# Patient Record
Sex: Female | Born: 1980 | Race: White | Hispanic: No | Marital: Married | Smoking: Current every day smoker
Health system: Southern US, Community
[De-identification: ages and names within clinical notes are randomized; demographics above are authoritative.]

## PROBLEM LIST (undated history)

## (undated) DIAGNOSIS — R569 Unspecified convulsions: Secondary | ICD-10-CM

## (undated) DIAGNOSIS — F4310 Post-traumatic stress disorder, unspecified: Secondary | ICD-10-CM

## (undated) DIAGNOSIS — K769 Liver disease, unspecified: Secondary | ICD-10-CM

## (undated) DIAGNOSIS — I10 Essential (primary) hypertension: Secondary | ICD-10-CM

## (undated) DIAGNOSIS — F1020 Alcohol dependence, uncomplicated: Secondary | ICD-10-CM

## (undated) DIAGNOSIS — F319 Bipolar disorder, unspecified: Secondary | ICD-10-CM

## (undated) HISTORY — PX: CHOLECYSTECTOMY: SHX55

---

## 2003-11-29 ENCOUNTER — Emergency Department: Payer: Self-pay | Admitting: Emergency Medicine

## 2003-11-29 ENCOUNTER — Other Ambulatory Visit: Payer: Self-pay

## 2003-11-29 ENCOUNTER — Ambulatory Visit: Payer: Self-pay | Admitting: Emergency Medicine

## 2004-02-06 ENCOUNTER — Ambulatory Visit: Payer: Self-pay | Admitting: General Surgery

## 2006-04-03 ENCOUNTER — Emergency Department: Payer: Self-pay | Admitting: Emergency Medicine

## 2006-04-04 IMAGING — CR DG CHEST 2V
1 series · 2 of 2 positions shown · non-contrast
Comparison: none

REASON FOR EXAM: Chest pain
COMMENTS:

PROCEDURE:     DXR - DXR CHEST PA (OR AP) AND LATERAL  - [DATE]  [DATE]
RESULT:     The lungs are mildly hyperinflated but clear. The heart and
pulmonary vascularity are normal in appearance.

[Series 1: view not recorded · 0.17mm/px · 2 of 2 slices shown]
[im 1/2]
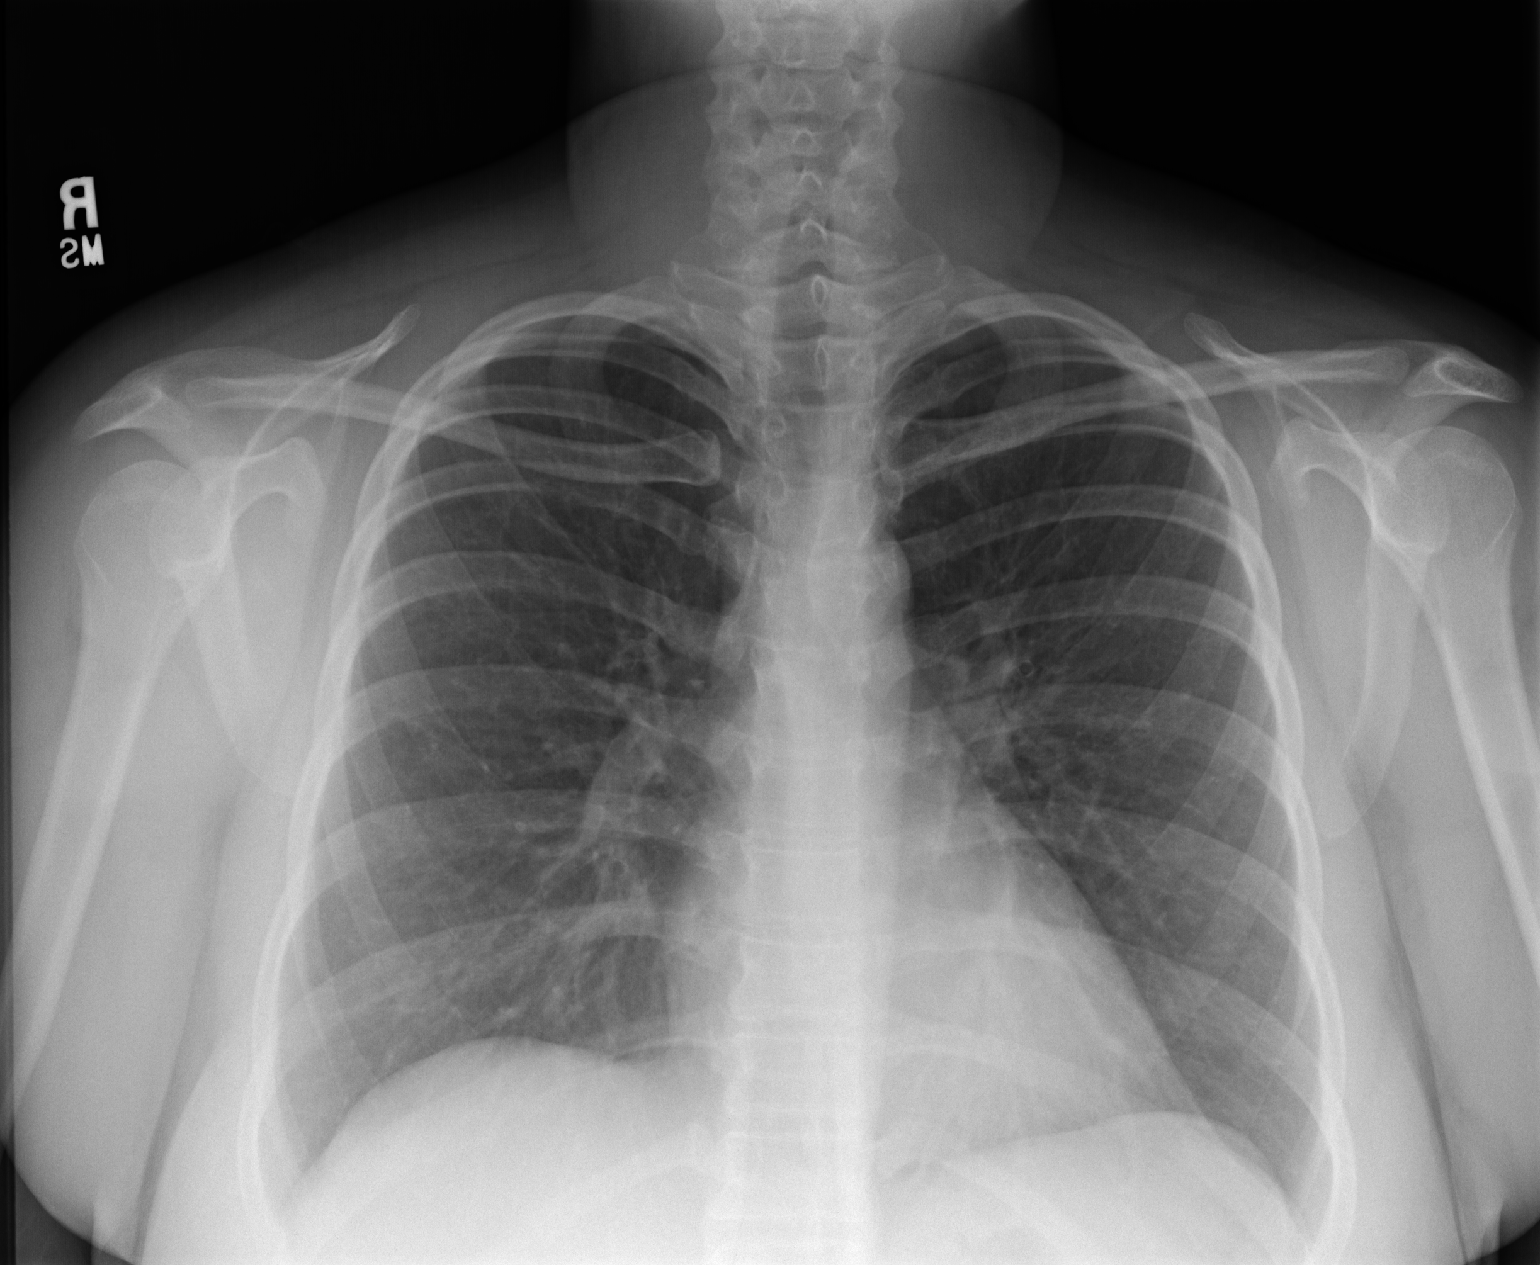
[im 2/2]
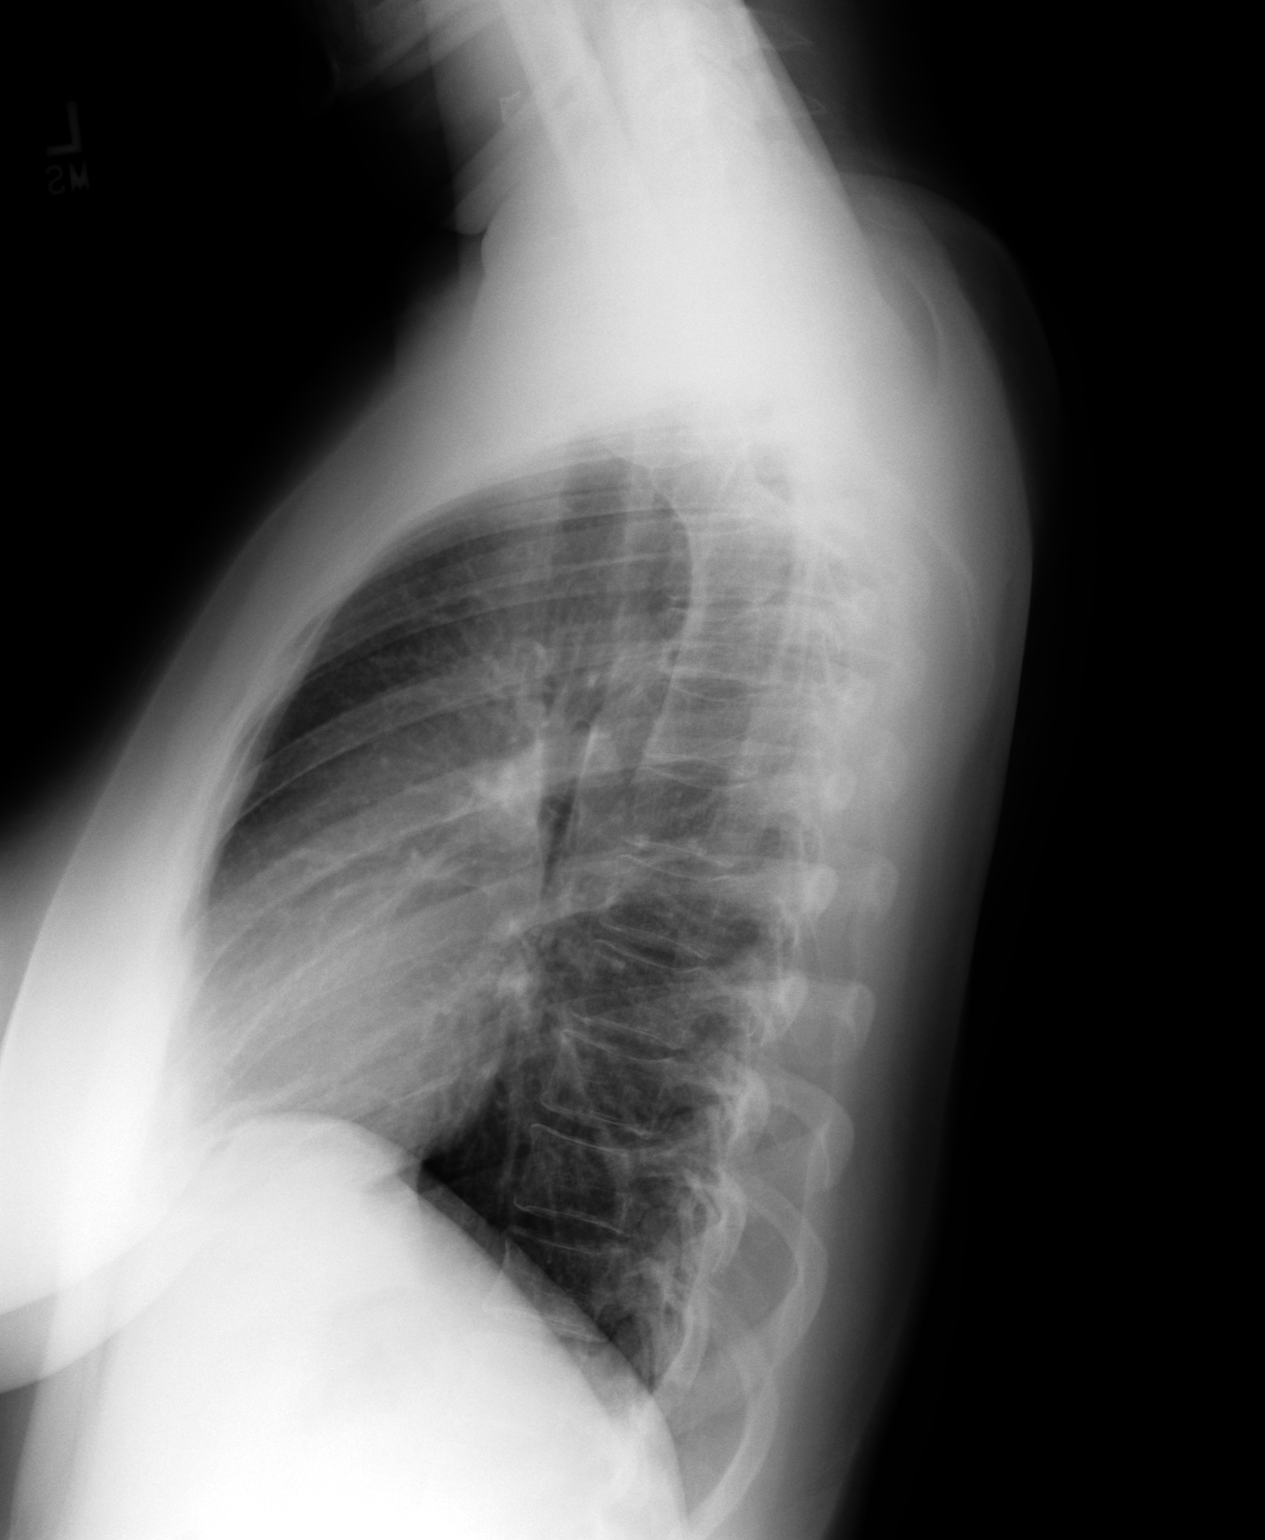

[2 of 2 positions shown; findings below may reference images not displayed]

IMPRESSION: I do not see evidence of acute cardiopulmonary abnormality.
Very mild hyperinflation, seen best on the lateral film, may be voluntary or
could reflect an element of underlying air trapping secondary to acute
bronchitis. Follow-up films would be of value, if symptoms persist.

## 2006-08-25 ENCOUNTER — Other Ambulatory Visit: Payer: Self-pay

## 2006-08-25 ENCOUNTER — Emergency Department: Payer: Self-pay | Admitting: Emergency Medicine

## 2007-02-18 ENCOUNTER — Encounter: Payer: Self-pay | Admitting: Maternal & Fetal Medicine

## 2007-02-18 IMAGING — US US OB DETAIL+14 WK - NRPT [facility]
1 series · 14 of 28 positions shown · non-contrast
Comparison: none

[Series 1: us ob detail+14 wk - nrpt [facility] · 0.37mm/px · 14 of 61 slices shown]
[im 3/61]
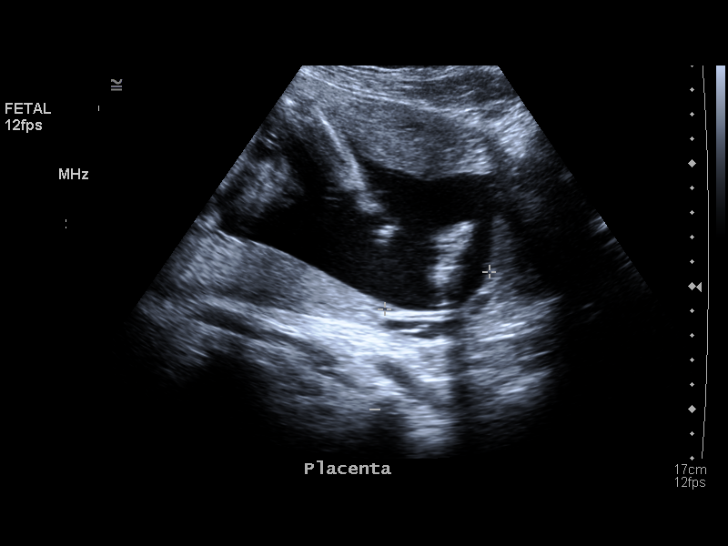
[im 7/61]
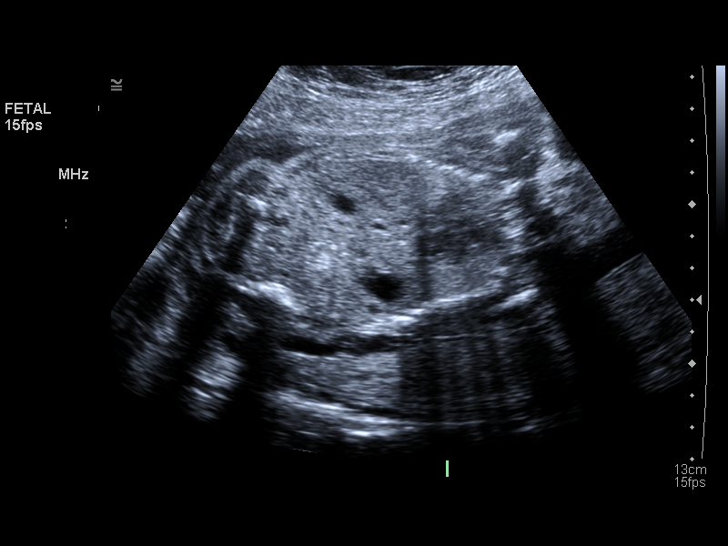
[im 12/61]
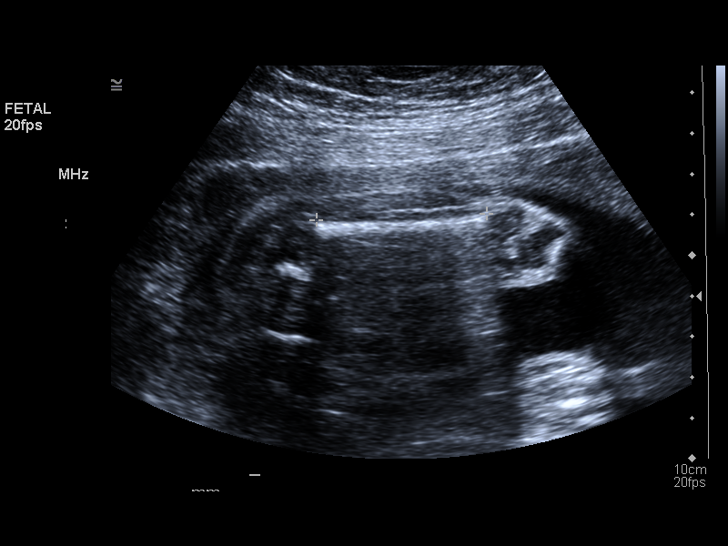
[im 16/61]
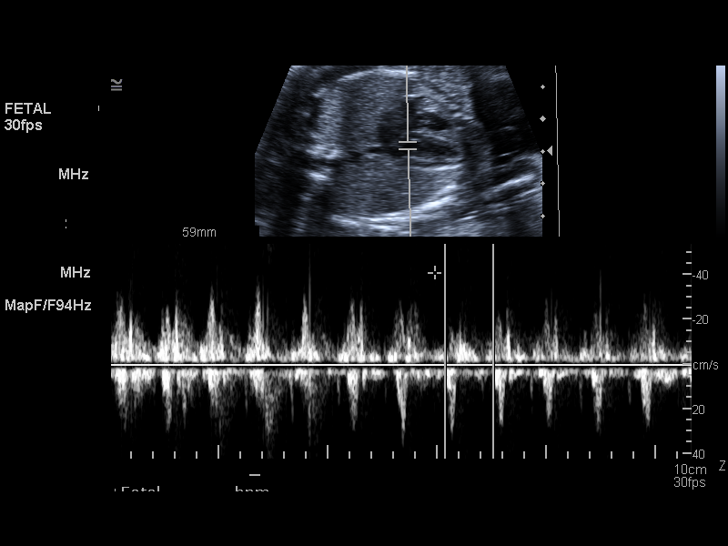
[im 21/61]
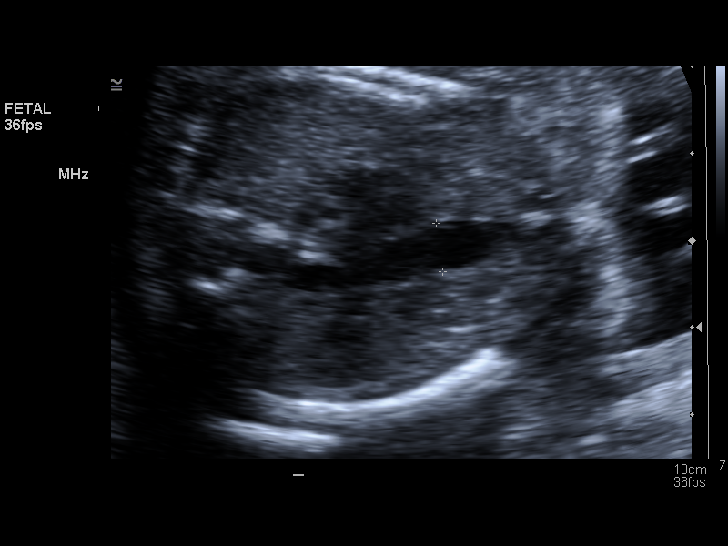
[im 25/61]
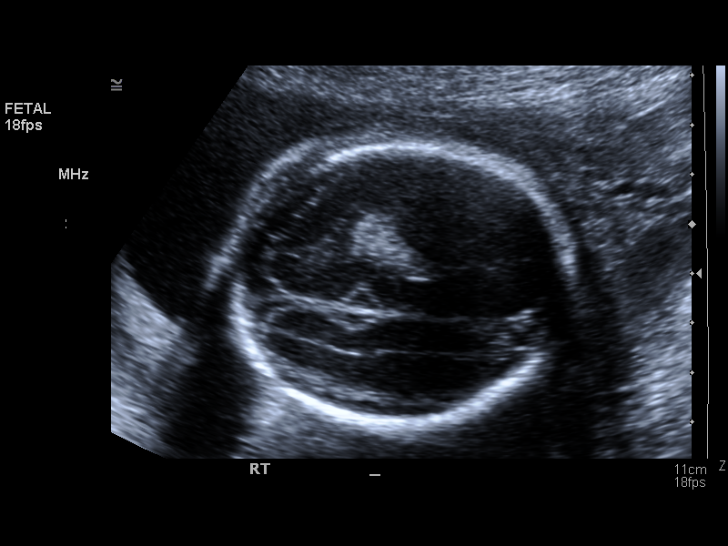
[im 29/61]
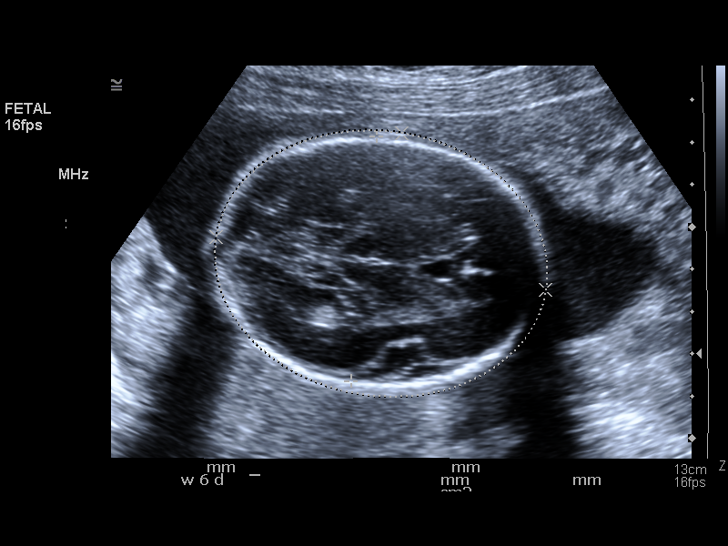
[im 34/61]
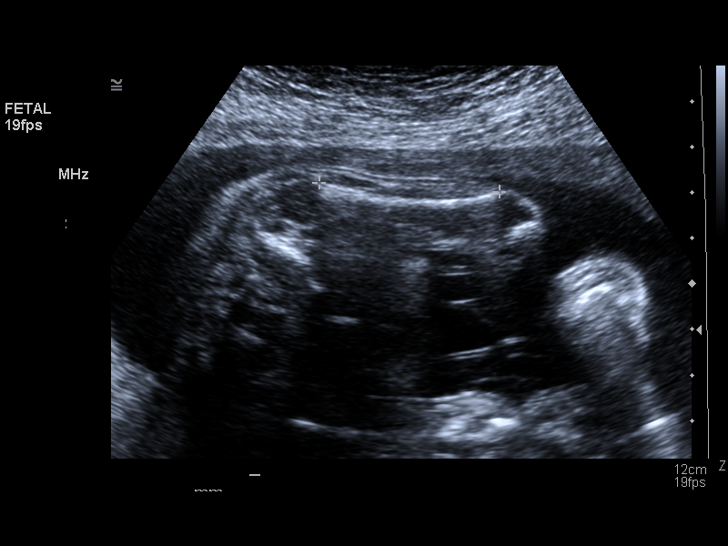
[im 38/61]
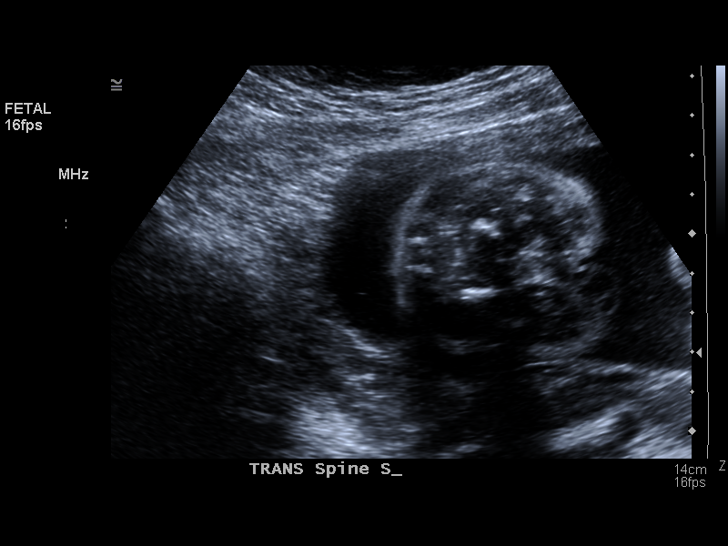
[im 43/61]
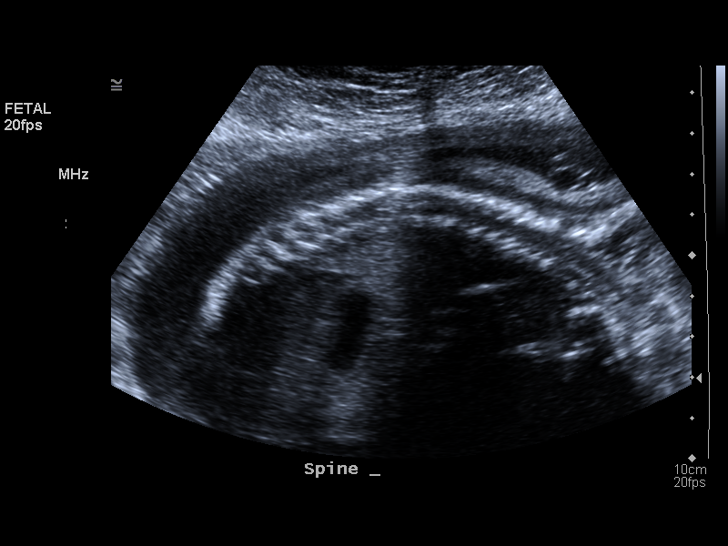
[im 47/61]
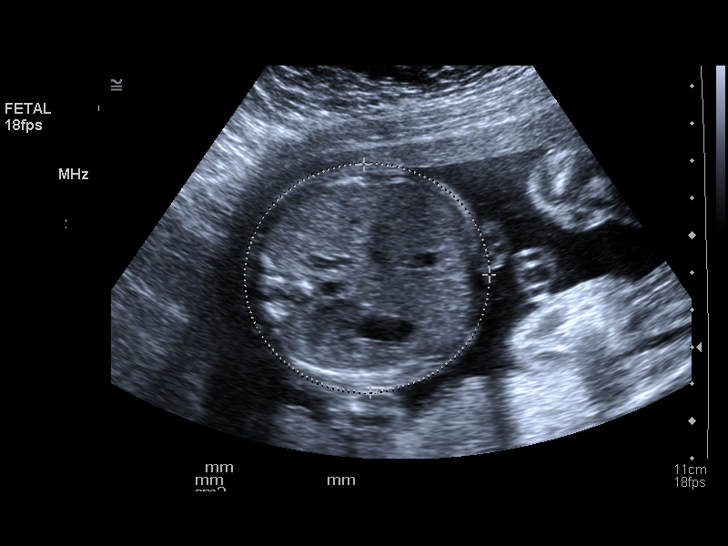
[im 52/61]
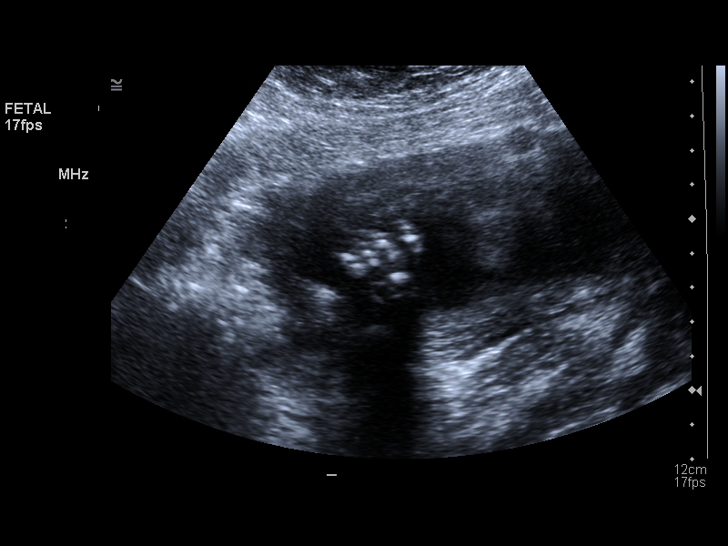
[im 56/61]
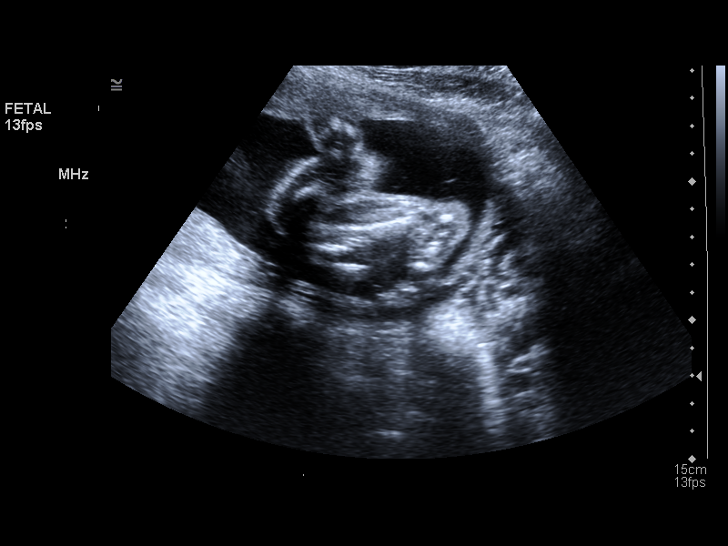
[im 61/61]
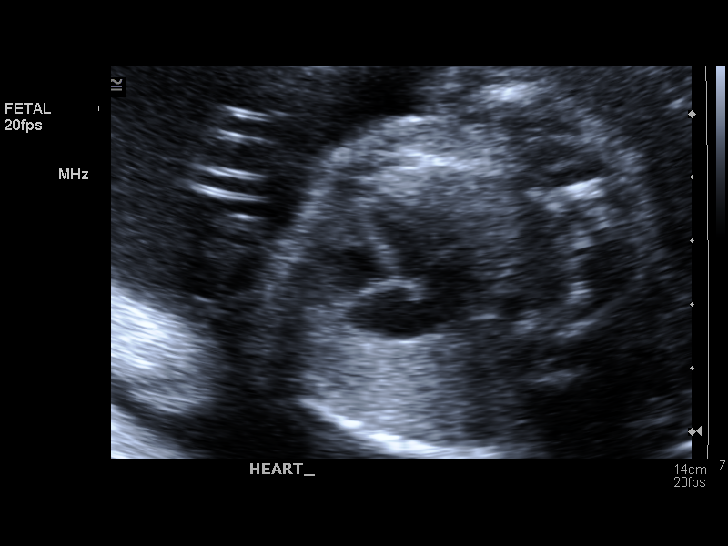

[14 of 28 positions shown; findings below may reference images not displayed]

IMAGES IMPORTED FROM THE [employer] WORKFLOW SYSTEM
NO DICTATION FOR STUDY

## 2007-04-28 ENCOUNTER — Observation Stay: Payer: Self-pay | Admitting: Obstetrics and Gynecology

## 2007-05-27 ENCOUNTER — Observation Stay: Payer: Self-pay

## 2007-06-08 ENCOUNTER — Observation Stay: Payer: Self-pay

## 2007-06-09 ENCOUNTER — Inpatient Hospital Stay: Payer: Self-pay

## 2007-07-10 ENCOUNTER — Emergency Department: Payer: Self-pay | Admitting: Emergency Medicine

## 2014-11-01 IMAGING — US US ABDOMEN LIMITED
1 series · 14 of 25 positions shown · non-contrast
Comparison: None.

CLINICAL DATA: Abdominal pain

EXAM:
US ABDOMEN LIMITED - RIGHT UPPER QUADRANT

[Series 1: us abdomen limited · 0.25mm/px · 14 of 46 slices shown]
[im 1/46]
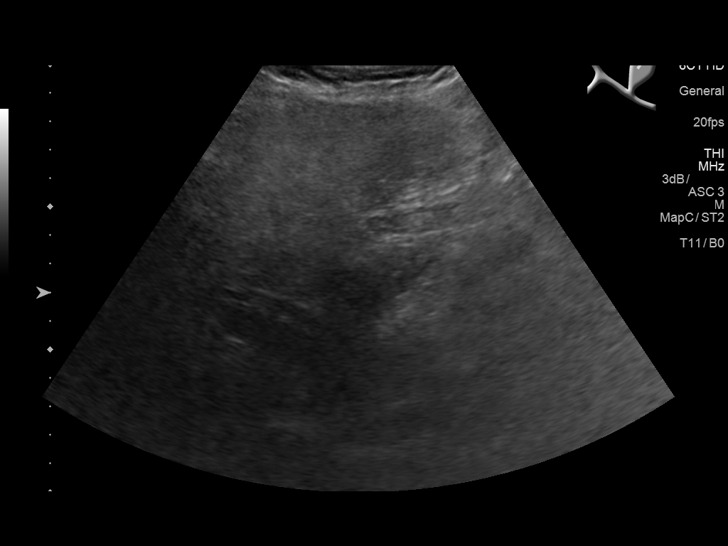
[im 4/46]
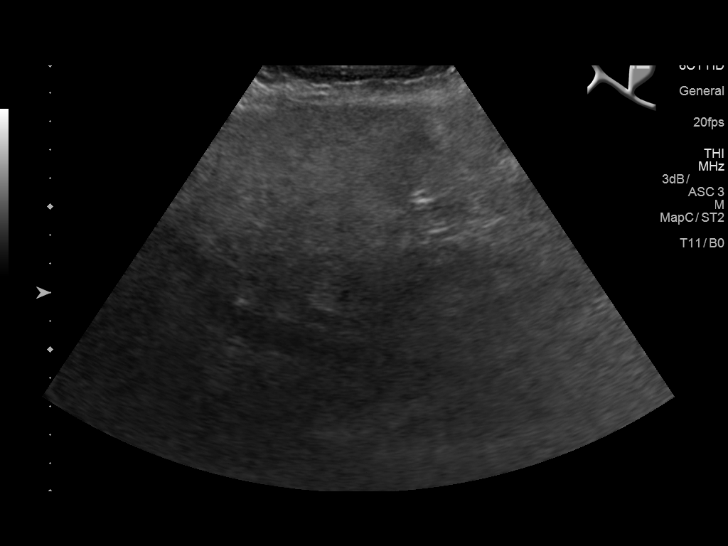
[im 8/46]
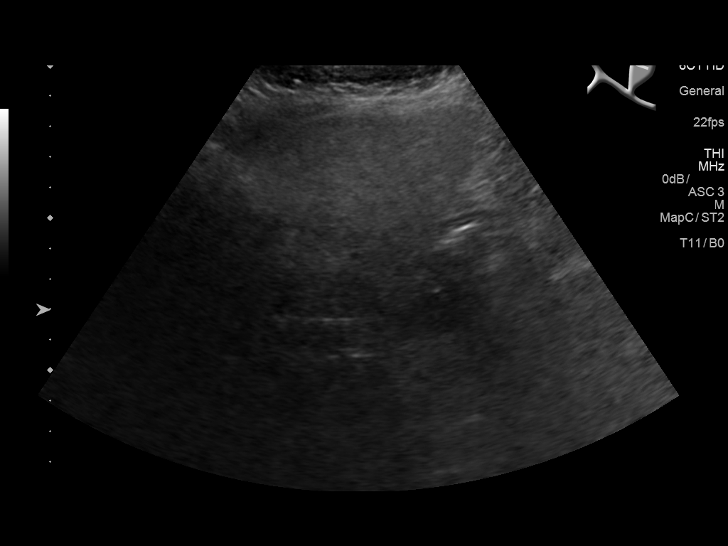
[im 12/46]
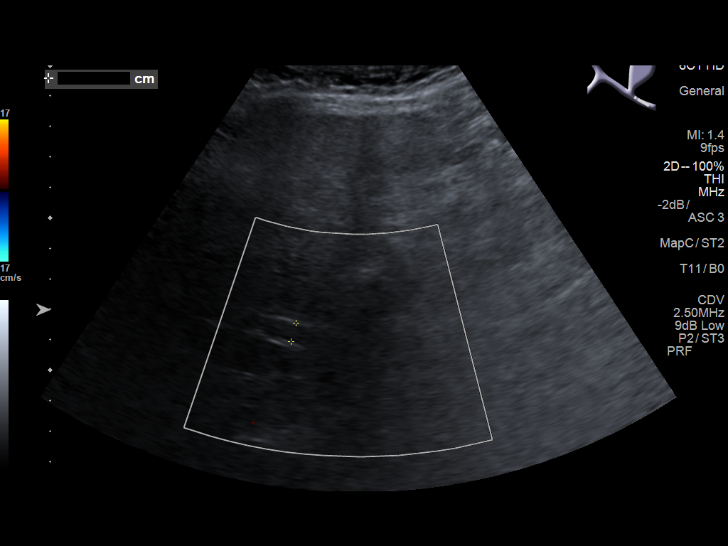
[im 16/46]
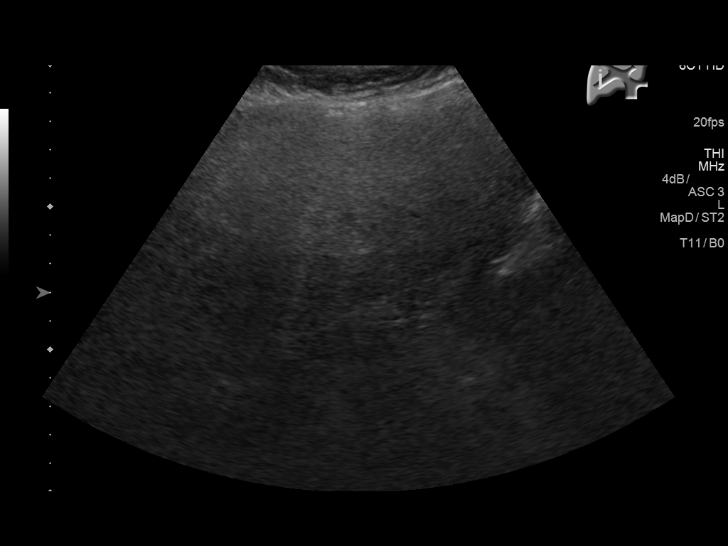
[im 17/46]
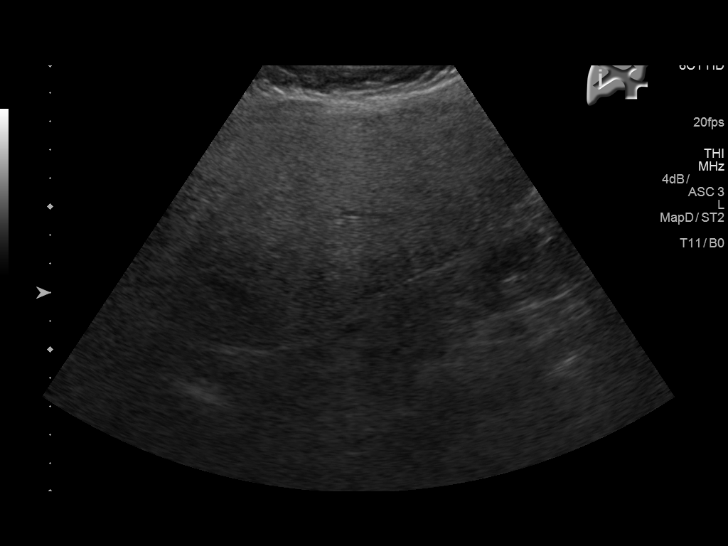
[im 21/46]
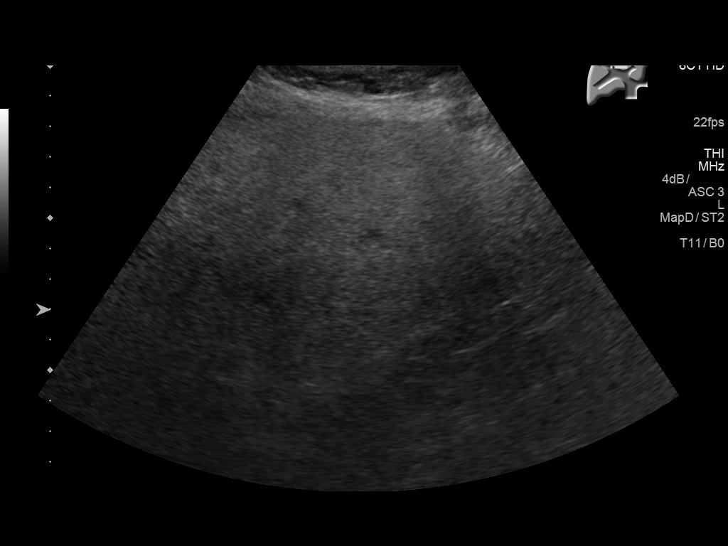
[im 25/46]
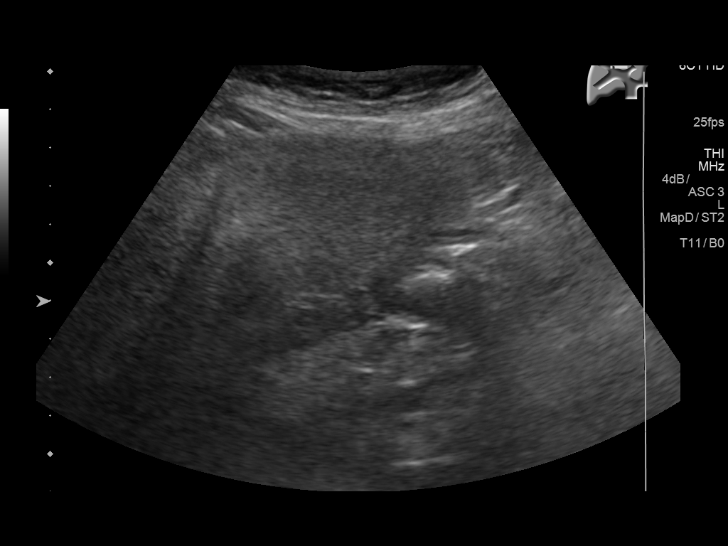
[im 29/46]
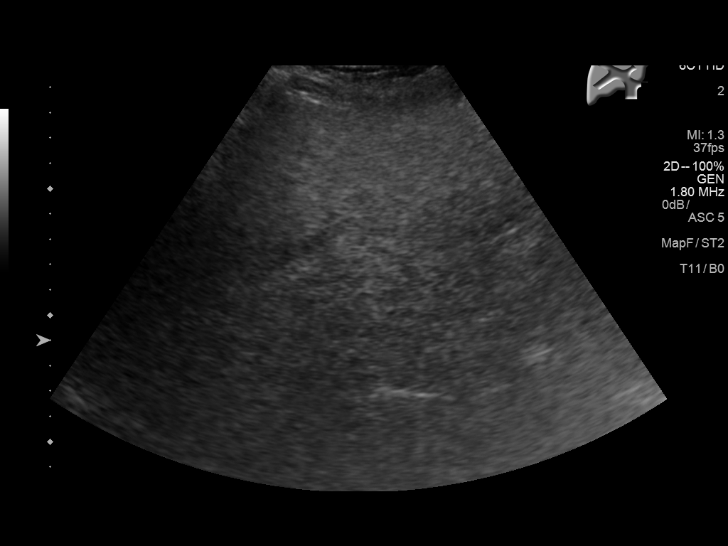
[im 31/46]
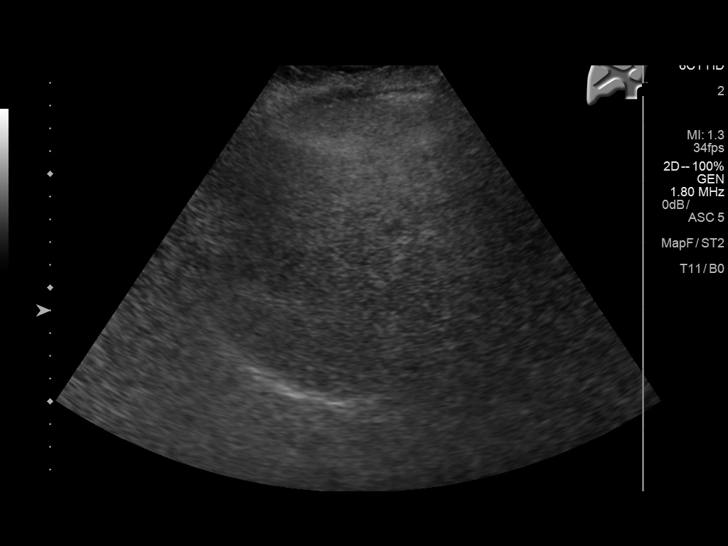
[im 34/46]
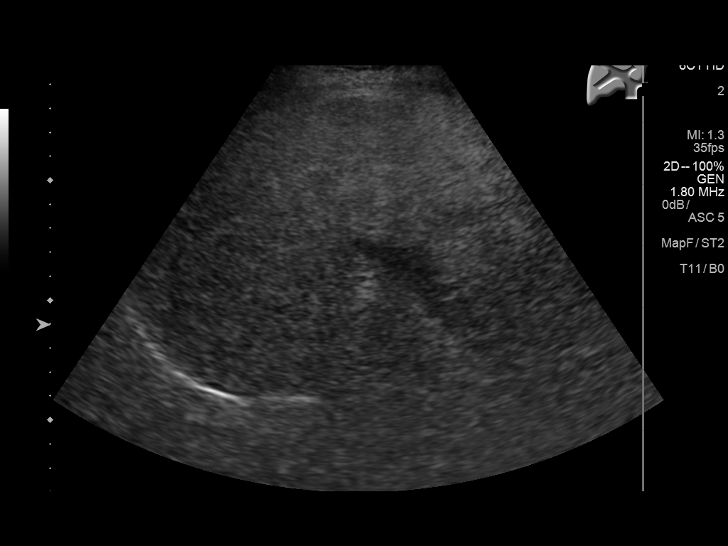
[im 38/46]
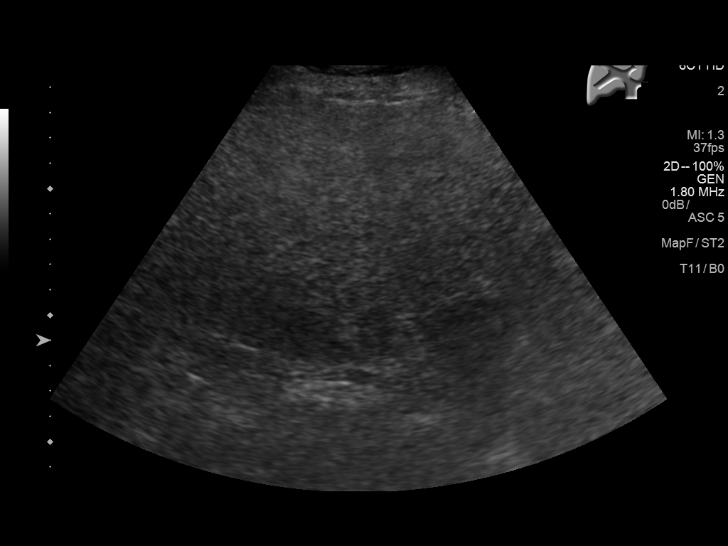
[im 42/46]
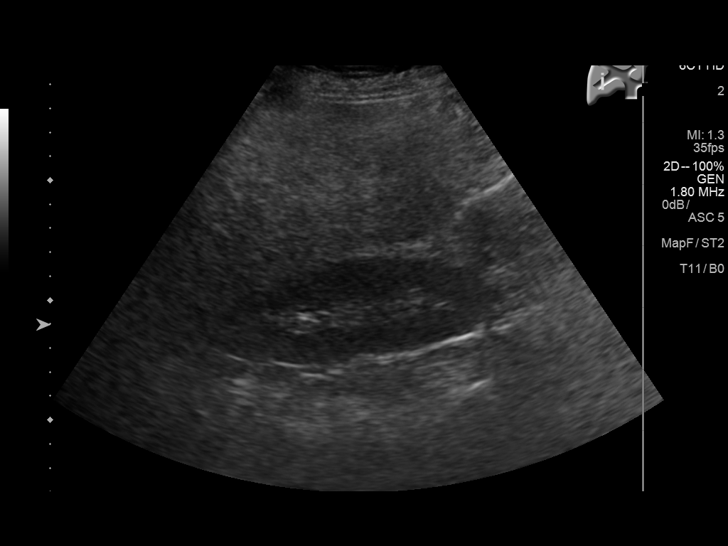
[im 46/46]
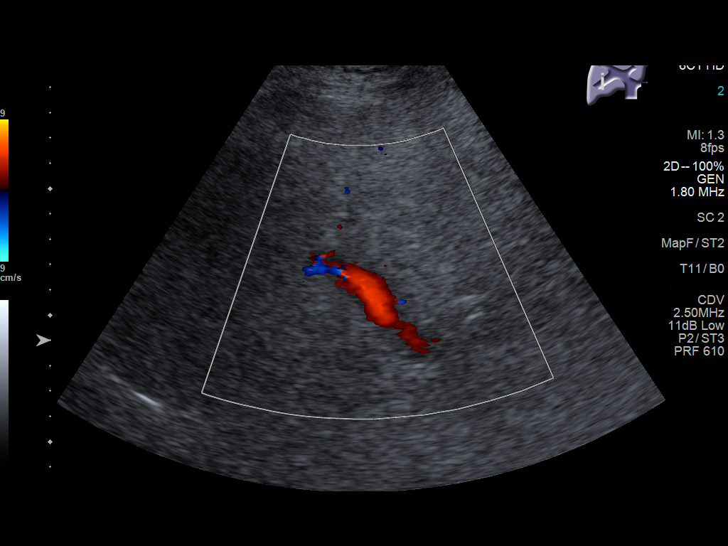

[14 of 25 positions shown; findings below may reference images not displayed]

FINDINGS: Gallbladder:

Prior cholecystectomy

Common bile duct:

Diameter: Normal diameter, 6 mm.

Liver:

Increased echotexture compatible with fatty infiltration. No focal
abnormality or biliary ductal dilatation.
IMPRESSION: Fatty infiltration of the liver.  No acute findings.

Prior cholecystectomy.

## 2016-02-27 ENCOUNTER — Emergency Department
Admission: EM | Admit: 2016-02-27 | Discharge: 2016-02-28 | Disposition: A | Discharge status: Other Acute Inpatient Hospital | Payer: Self-pay | Attending: Emergency Medicine | Admitting: Emergency Medicine

## 2016-02-27 ENCOUNTER — Emergency Department: Payer: Self-pay

## 2016-02-27 ENCOUNTER — Encounter: Payer: Self-pay | Admitting: Emergency Medicine

## 2016-02-27 DIAGNOSIS — E876 Hypokalemia: Secondary | ICD-10-CM | POA: Insufficient documentation

## 2016-02-27 DIAGNOSIS — K7040 Alcoholic hepatic failure without coma: Secondary | ICD-10-CM | POA: Insufficient documentation

## 2016-02-27 DIAGNOSIS — F1020 Alcohol dependence, uncomplicated: Secondary | ICD-10-CM

## 2016-02-27 DIAGNOSIS — R109 Unspecified abdominal pain: Secondary | ICD-10-CM

## 2016-02-27 DIAGNOSIS — F17200 Nicotine dependence, unspecified, uncomplicated: Secondary | ICD-10-CM | POA: Insufficient documentation

## 2016-02-27 DIAGNOSIS — R9431 Abnormal electrocardiogram [ECG] [EKG]: Secondary | ICD-10-CM

## 2016-02-27 DIAGNOSIS — I10 Essential (primary) hypertension: Secondary | ICD-10-CM | POA: Insufficient documentation

## 2016-02-27 DIAGNOSIS — K7290 Hepatic failure, unspecified without coma: Secondary | ICD-10-CM

## 2016-02-27 DIAGNOSIS — G9389 Other specified disorders of brain: Secondary | ICD-10-CM | POA: Insufficient documentation

## 2016-02-27 DIAGNOSIS — K7200 Acute and subacute hepatic failure without coma: Secondary | ICD-10-CM

## 2016-02-27 HISTORY — DX: Bipolar disorder, unspecified: F31.9

## 2016-02-27 HISTORY — DX: Alcohol dependence, uncomplicated: F10.20

## 2016-02-27 HISTORY — DX: Post-traumatic stress disorder, unspecified: F43.10

## 2016-02-27 HISTORY — DX: Essential (primary) hypertension: I10

## 2016-02-27 LAB — BASIC METABOLIC PANEL
Anion gap: 28 — ABNORMAL HIGH (ref 5–15)
BUN: <5 mg/dL — ABNORMAL LOW (ref 6–20)
CHLORIDE: 65 mmol/L — AB (ref 101–111)
CO2: 38 mmol/L — AB (ref 22–32)
CREATININE: 0.34 mg/dL — AB (ref 0.44–1.00)
Calcium: 8.6 mg/dL — ABNORMAL LOW (ref 8.9–10.3)
GFR calc non Af Amer: >60 mL/min (ref >60)
Glucose, Bld: 130 mg/dL — ABNORMAL HIGH (ref 65–99)
Potassium: <2 mmol/L — CL (ref 3.5–5.1)
Sodium: 131 mmol/L — ABNORMAL LOW (ref 135–145)

## 2016-02-27 LAB — CBC
HCT: 35.8 % (ref 35.0–47.0)
HEMOGLOBIN: 12.6 g/dL (ref 12.0–16.0)
MCH: 39.5 pg — AB (ref 26.0–34.0)
MCHC: 35.3 g/dL (ref 32.0–36.0)
MCV: 111.7 fL — AB (ref 80.0–100.0)
PLATELETS: 101 10*3/uL — AB (ref 150–440)
RBC: 3.2 MIL/uL — AB (ref 3.80–5.20)
RDW: 20.2 % — ABNORMAL HIGH (ref 11.5–14.5)
WBC: 5.8 10*3/uL (ref 3.6–11.0)

## 2016-02-27 LAB — ETHANOL: Alcohol, Ethyl (B): 96 mg/dL — ABNORMAL HIGH (ref <5)

## 2016-02-27 LAB — HEPATIC FUNCTION PANEL
ALK PHOS: 390 U/L — AB (ref 38–126)
ALT: 80 U/L — AB (ref 14–54)
AST: 450 U/L — AB (ref 15–41)
Albumin: 2.8 g/dL — ABNORMAL LOW (ref 3.5–5.0)
BILIRUBIN DIRECT: 8.2 mg/dL — AB (ref 0.1–0.5)
BILIRUBIN TOTAL: 14.8 mg/dL — AB (ref 0.3–1.2)
Indirect Bilirubin: 6.6 mg/dL — ABNORMAL HIGH (ref 0.3–0.9)
Total Protein: 6.8 g/dL (ref 6.5–8.1)

## 2016-02-27 LAB — MAGNESIUM: MAGNESIUM: 1.4 mg/dL — AB (ref 1.7–2.4)

## 2016-02-27 LAB — TROPONIN I: Troponin I: 0.04 ng/mL (ref <0.03)

## 2016-02-27 LAB — AMMONIA: AMMONIA: 36 umol/L — AB (ref 9–35)

## 2016-02-27 LAB — POCT PREGNANCY, URINE: Preg Test, Ur: NEGATIVE

## 2016-02-27 LAB — POTASSIUM: POTASSIUM: 3.1 mmol/L — AB (ref 3.5–5.1)

## 2016-02-27 LAB — APTT: aPTT: 40 seconds — ABNORMAL HIGH (ref 24–36)

## 2016-02-27 LAB — ACETAMINOPHEN LEVEL: Acetaminophen (Tylenol), Serum: <10 ug/mL — ABNORMAL LOW (ref 10–30)

## 2016-02-27 LAB — PROTIME-INR
INR: 1.38
Prothrombin Time: 17.1 seconds — ABNORMAL HIGH (ref 11.4–15.2)

## 2016-02-27 IMAGING — CR DG CHEST 2V
2 series · 2 of 2 positions shown · non-contrast
Comparison: [DATE]

CLINICAL DATA: Chest pain

EXAM:
CHEST  2 VIEW

[chest lat]
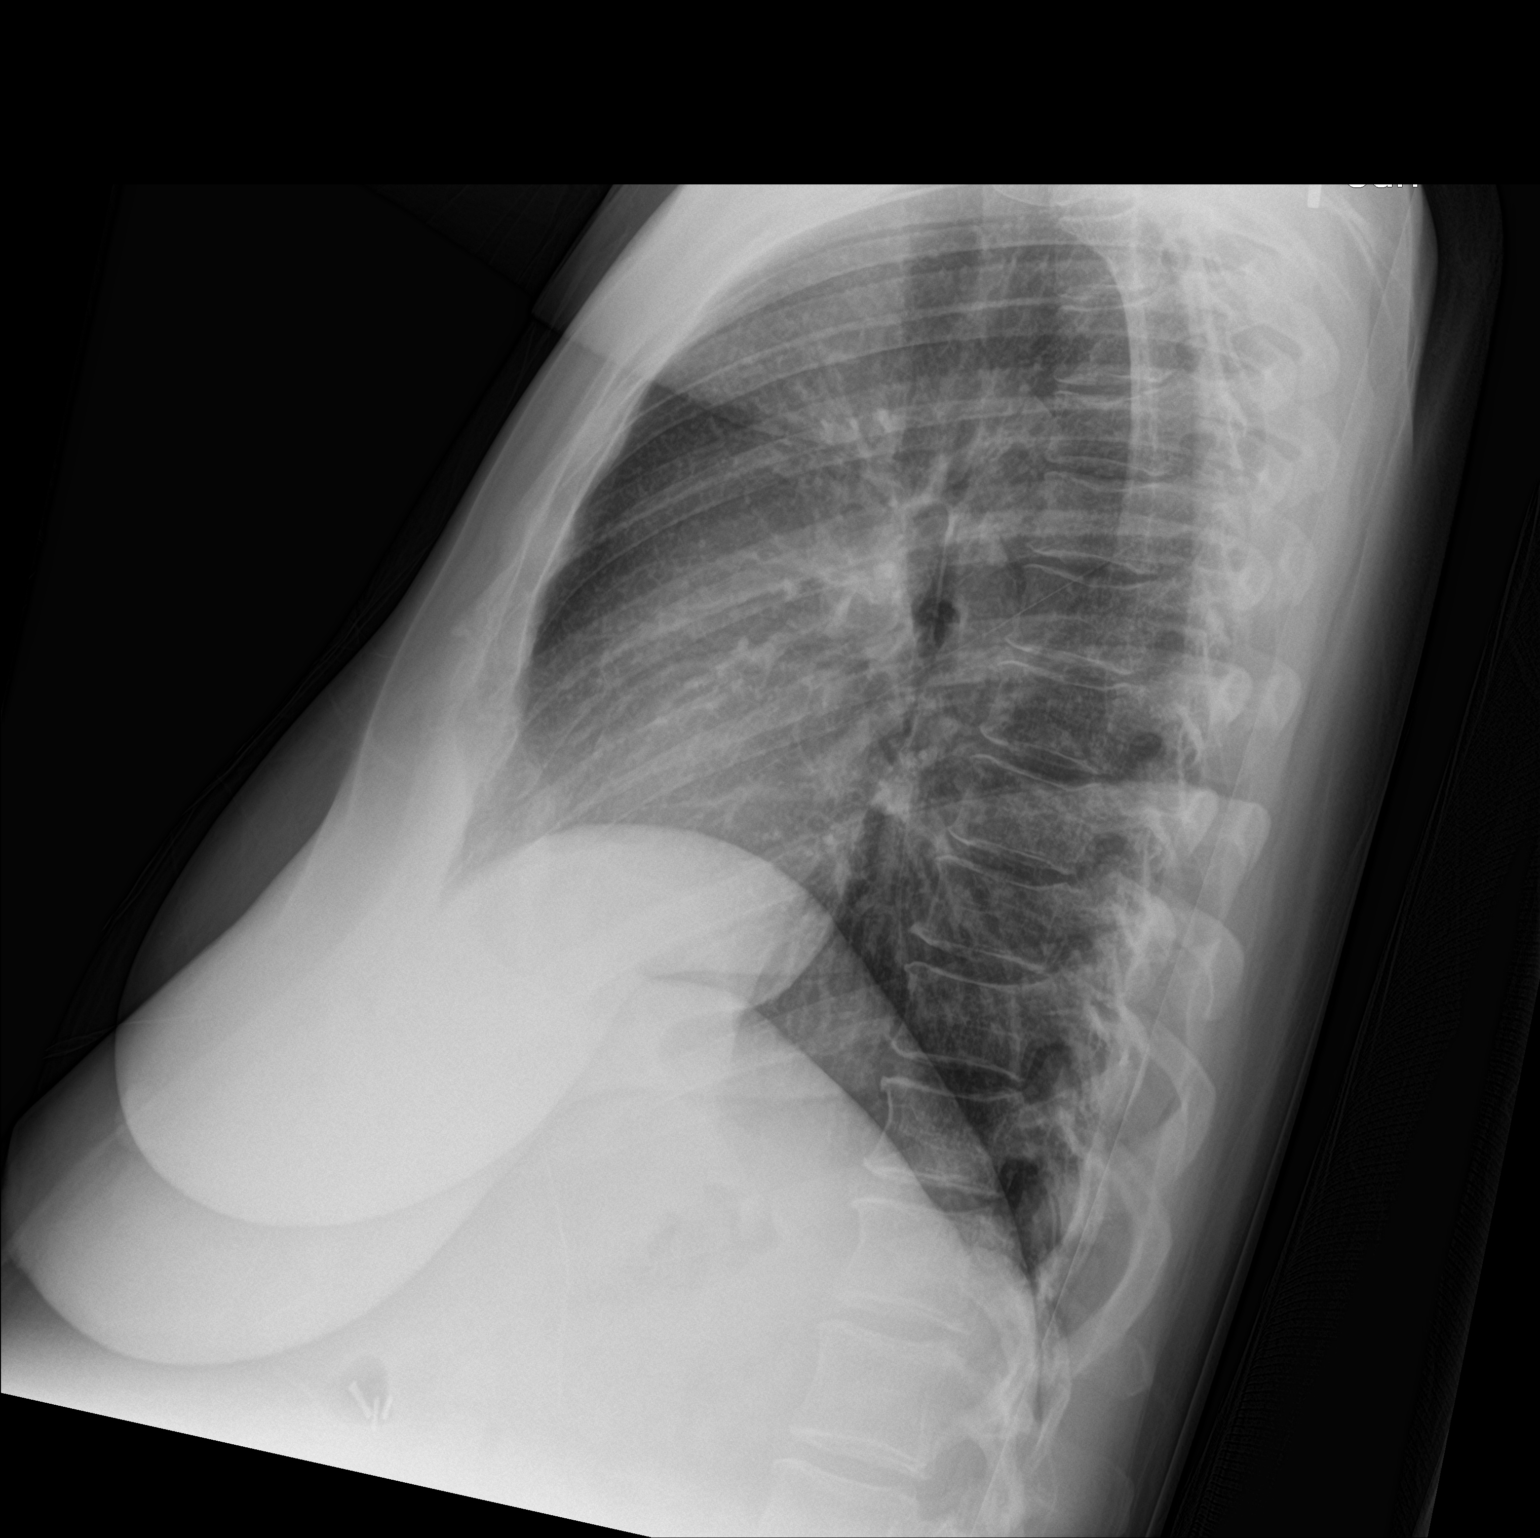

[chest ap]
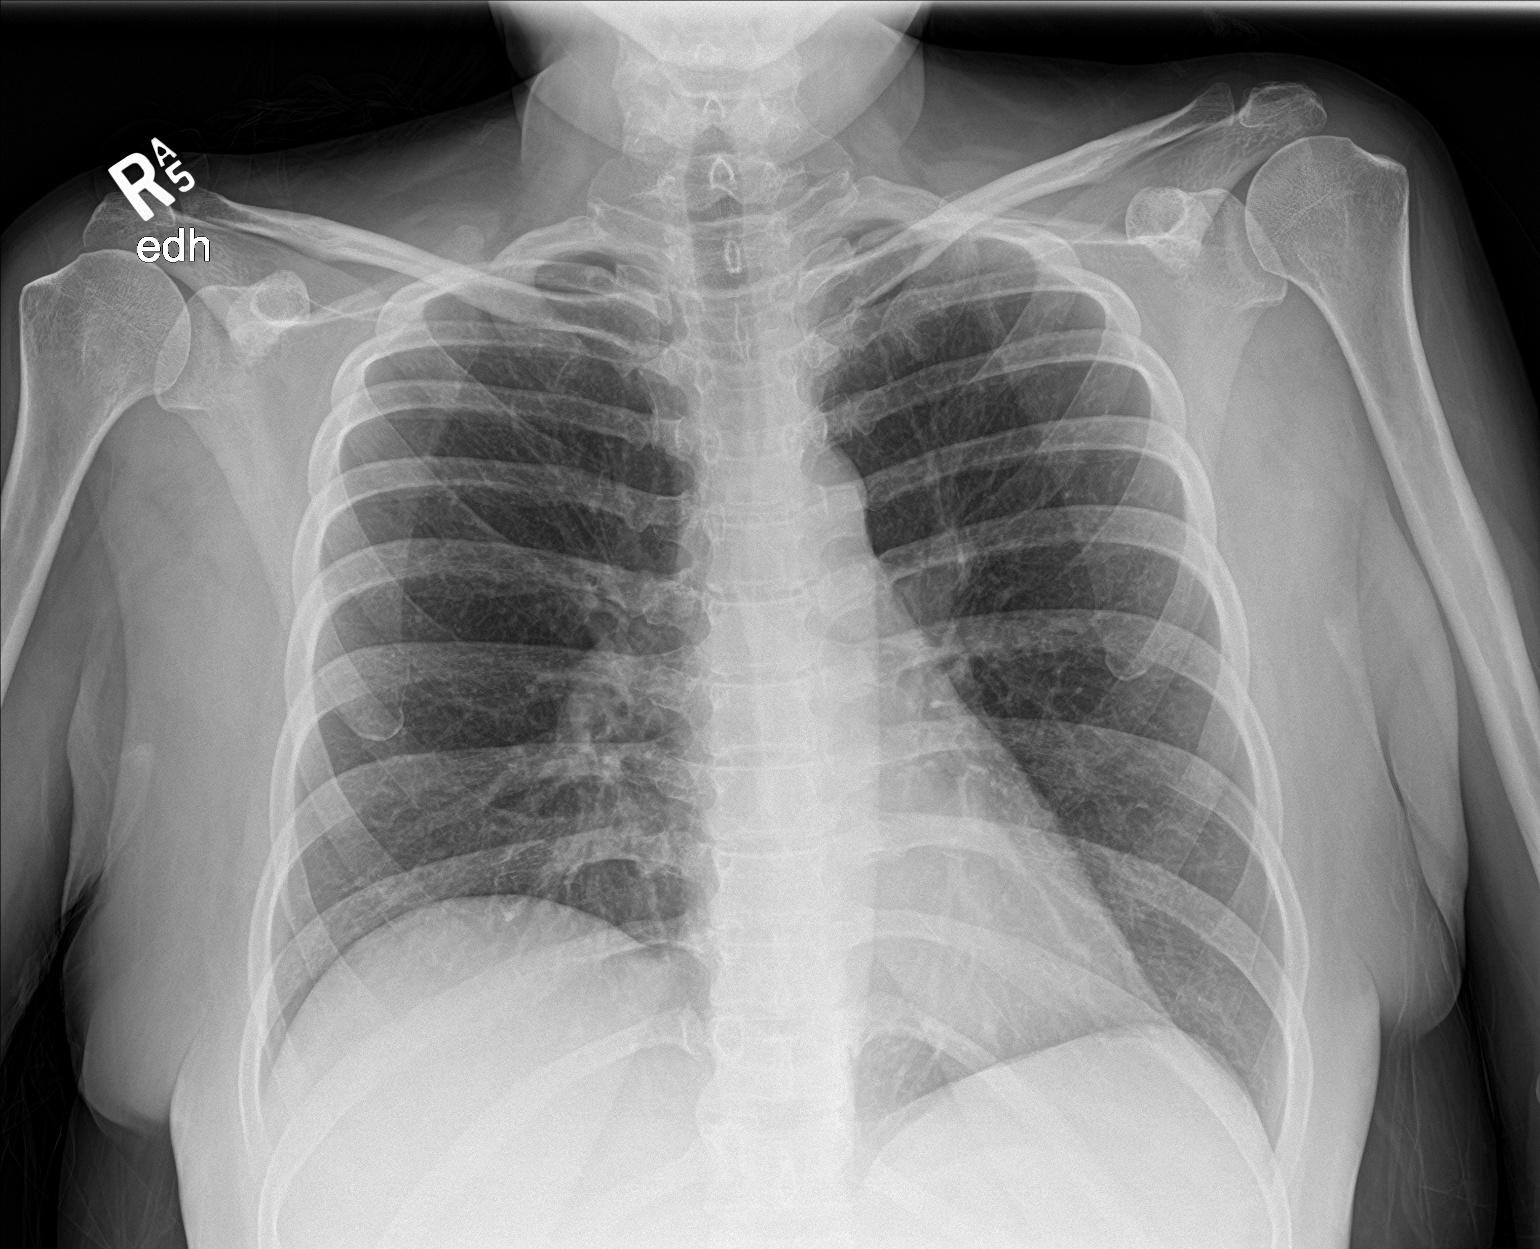

[2 of 2 positions shown; findings below may reference images not displayed]

FINDINGS: Heart and mediastinal contours are within normal limits. No focal
opacities or effusions. No acute bony abnormality.
IMPRESSION: No active cardiopulmonary disease.

## 2016-02-27 IMAGING — CT CT HEAD W/O CM
3 series · 15 of 46 positions shown, 18 images · non-contrast
Comparison: None.

CLINICAL DATA: Seizure activity and decreased responsiveness

EXAM:
CT HEAD WITHOUT CONTRAST
TECHNIQUE: Contiguous axial images were obtained from the base of the skull
through the vertex without intravenous contrast.

[Series 2: head wo · axial · 0.47mm/px · z∈[-153,-33]mm · 9 of 29 slices shown, 12 images]
[im 3/29  brain]
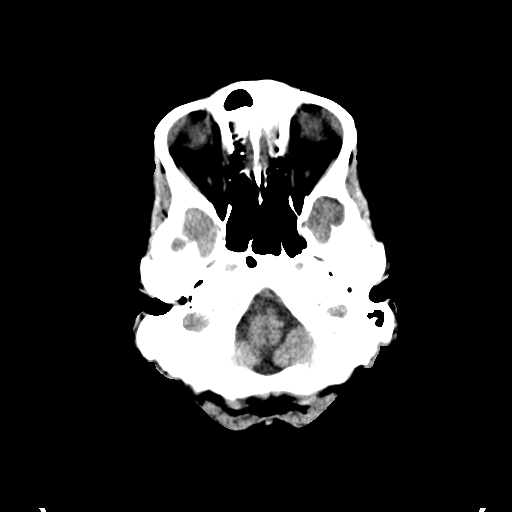
[im 3/29  bone]
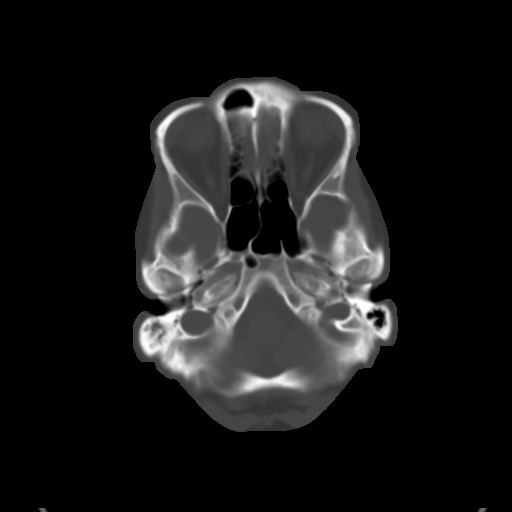
[im 6/29  brain]
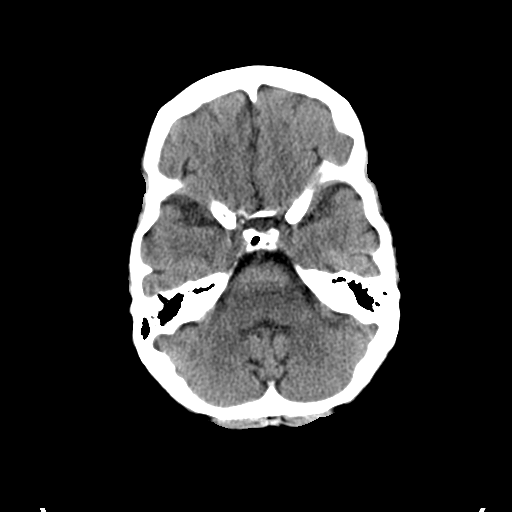
[im 9/29  brain]
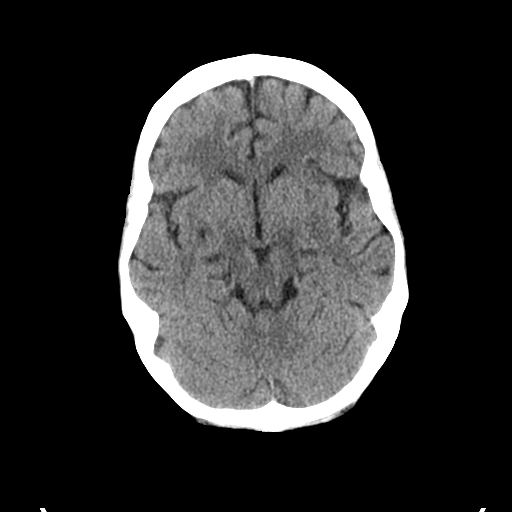
[im 12/29  brain]
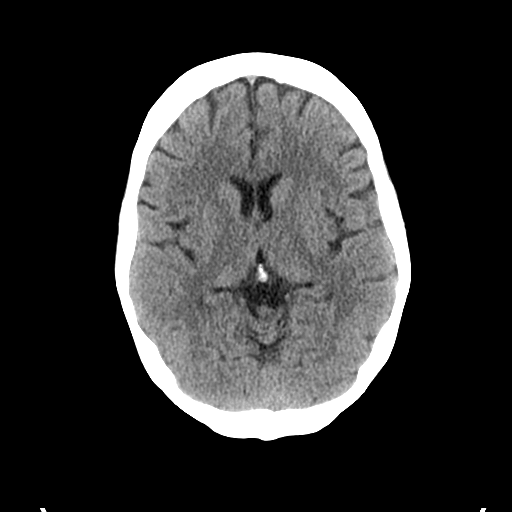
[im 15/29  brain]
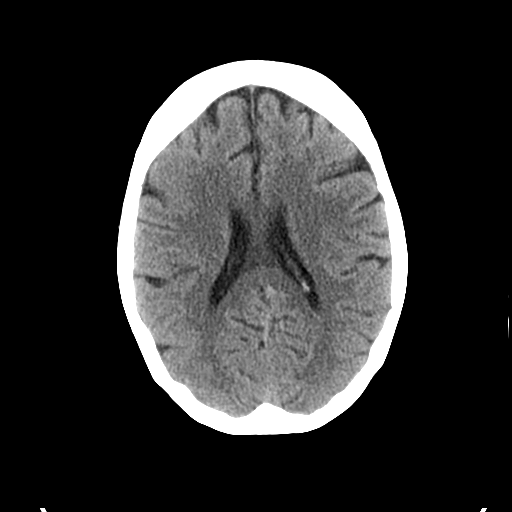
[im 15/29  bone]
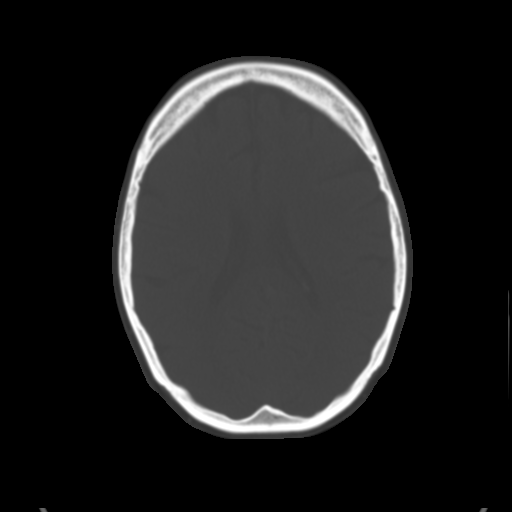
[im 18/29  brain]
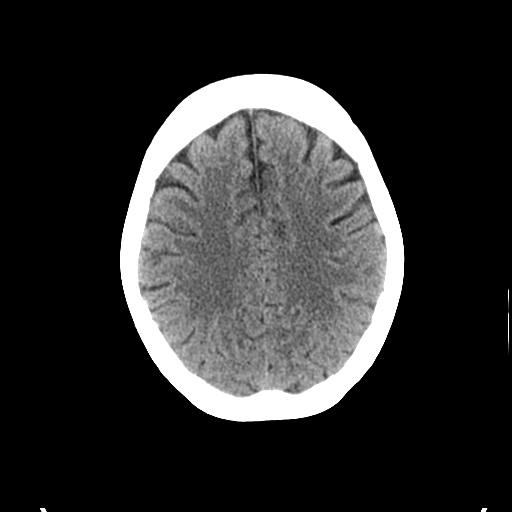
[im 21/29  brain]
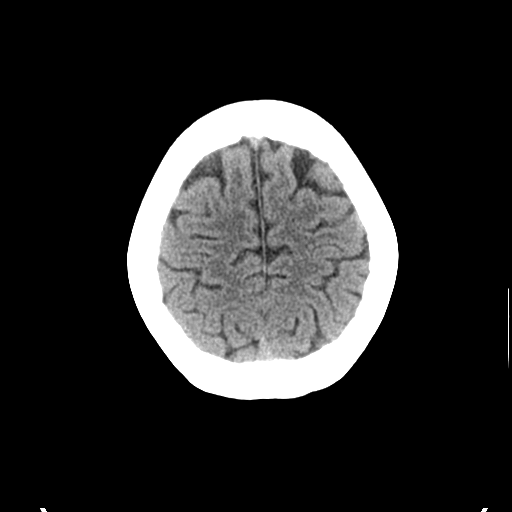
[im 24/29  brain]
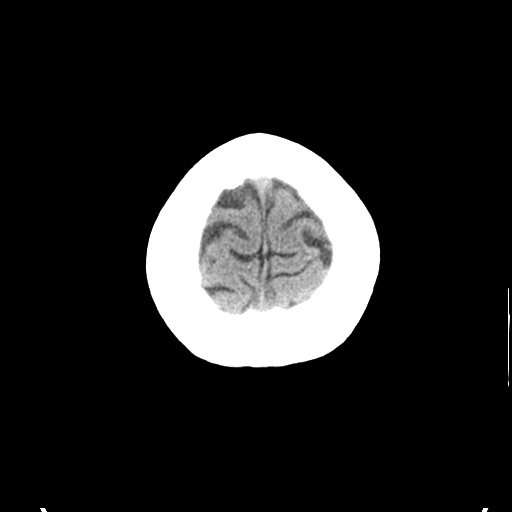
[im 27/29  brain]
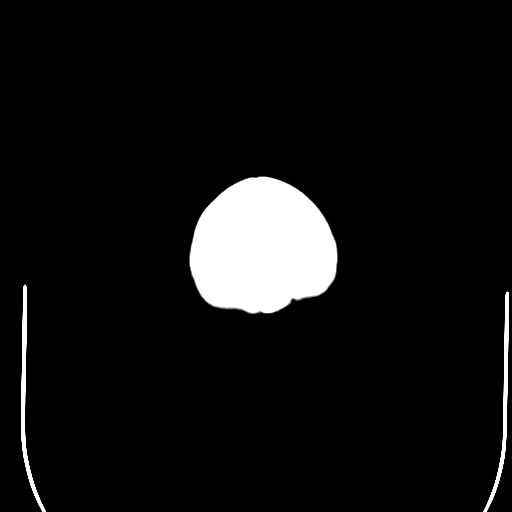
[im 27/29  bone]
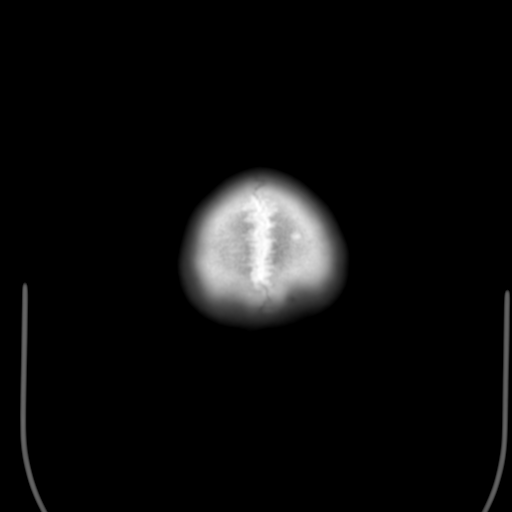

[Series 4: coronal soft tissue · coronal · 0.28mm/px · 3 of 62 slices shown]
[im 21/62  brain]
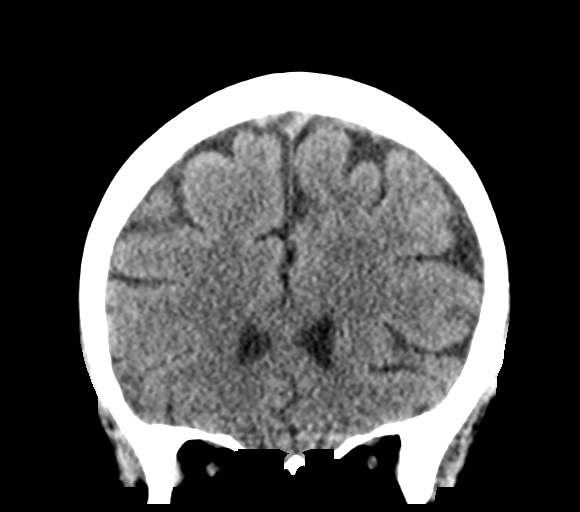
[im 28/62  brain]
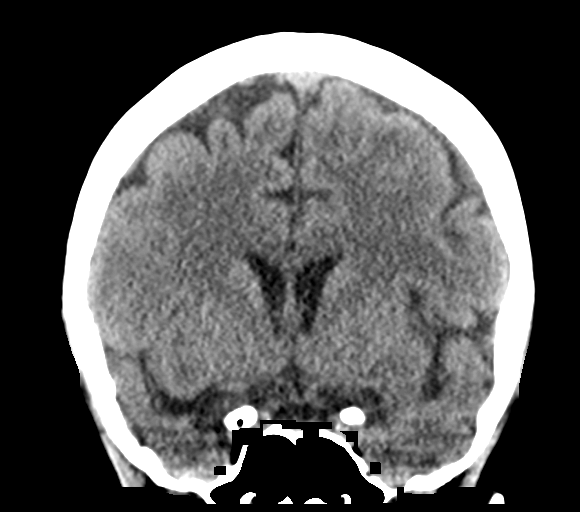
[im 34/62  brain]
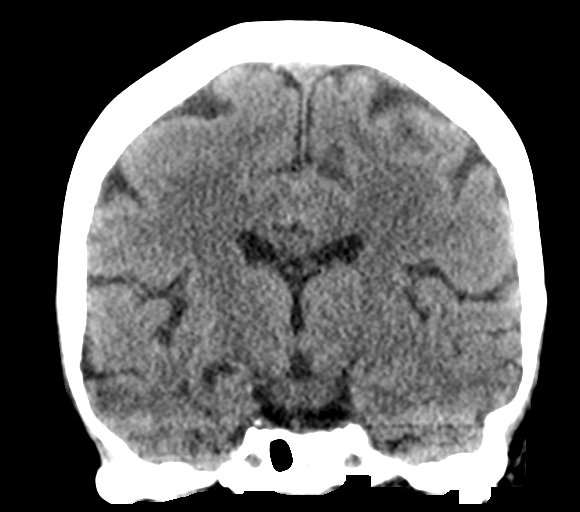

[Series 5: sagittal soft tissue · sagittal · 0.29mm/px · 3 of 46 slices shown]
[im 16/46  brain]
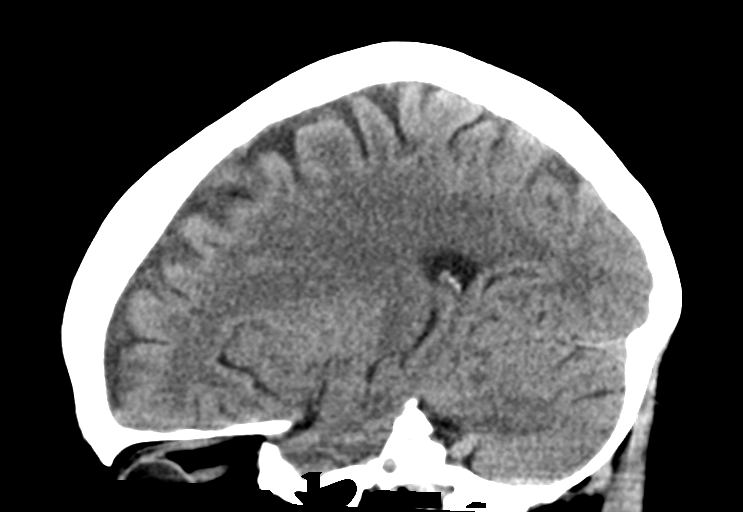
[im 23/46  brain]
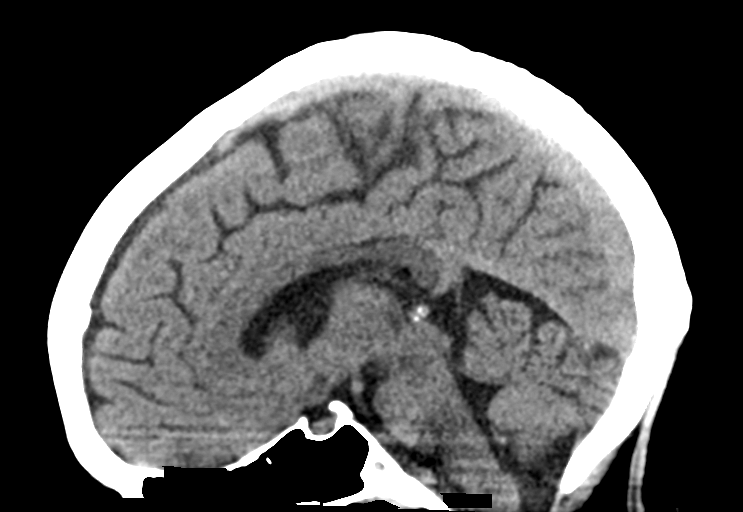
[im 31/46  brain]
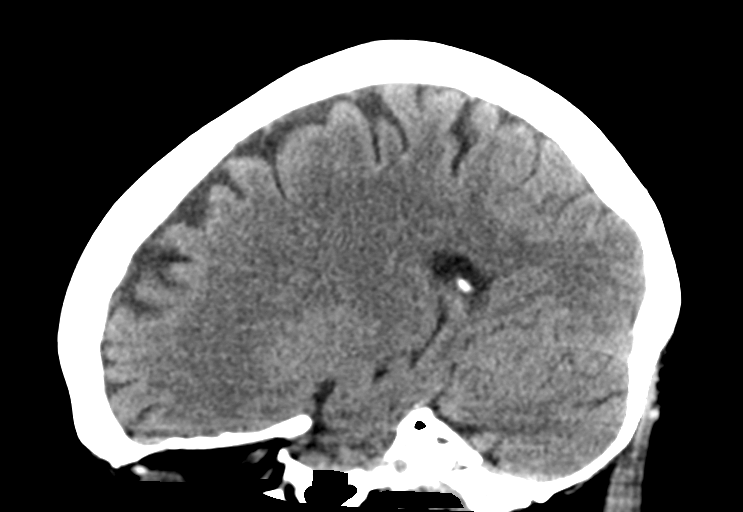

[15 of 46 positions shown; findings below may reference images not displayed]

FINDINGS: Brain: No evidence of acute infarction, hemorrhage, hydrocephalus,
extra-axial collection or mass lesion/mass effect. Mild prominence
of the cortical sulci is noted.

Vascular: No hyperdense vessel or unexpected calcification.

Skull: Normal. Negative for fracture or focal lesion.

Sinuses/Orbits: No acute finding.

Other: None.
IMPRESSION: Mild sulcal prominence.  No other focal abnormality is seen.

## 2016-02-27 MED ORDER — VITAMIN B-1 100 MG PO TABS
100.0000 mg | ORAL_TABLET | Freq: Every day | ORAL | Status: DC
  Administered 2016-02-28: 100 mg via ORAL
  Filled 2016-02-27: qty 1

## 2016-02-27 MED ORDER — ADULT MULTIVITAMIN W/MINERALS CH
1.0000 | ORAL_TABLET | Freq: Every day | ORAL | Status: DC
  Administered 2016-02-27 – 2016-02-28 (×3): 1 via ORAL
  Filled 2016-02-27 (×3): qty 1

## 2016-02-27 MED ORDER — SODIUM CHLORIDE 0.9 % IV SOLN
30.0000 meq | Freq: Once | INTRAVENOUS | Status: AC
  Administered 2016-02-27: 30 meq via INTRAVENOUS
  Filled 2016-02-27: qty 15

## 2016-02-27 MED ORDER — POTASSIUM CHLORIDE CRYS ER 20 MEQ PO TBCR: 20.0000 meq | EXTENDED_RELEASE_TABLET | Freq: Once | ORAL | Status: DC

## 2016-02-27 MED ORDER — LACTULOSE 10 GM/15ML PO SOLN
10.0000 g | Freq: Two times a day (BID) | ORAL | Status: DC
  Administered 2016-02-27 – 2016-02-28 (×2): 10 g via ORAL
  Filled 2016-02-27 (×2): qty 30

## 2016-02-27 MED ORDER — THIAMINE HCL 100 MG/ML IJ SOLN
100.0000 mg | Freq: Once | INTRAMUSCULAR | Status: AC
  Administered 2016-02-27: 100 mg via INTRAMUSCULAR
  Filled 2016-02-27: qty 2

## 2016-02-27 MED ORDER — LORAZEPAM 1 MG PO TABS
1.0000 mg | ORAL_TABLET | Freq: Once | ORAL | Status: AC
  Administered 2016-02-27: 1 mg via ORAL
  Filled 2016-02-27: qty 1

## 2016-02-27 MED ORDER — LORAZEPAM 1 MG PO TABS
1.0000 mg | ORAL_TABLET | Freq: Four times a day (QID) | ORAL | Status: DC | PRN
  Administered 2016-02-27 – 2016-02-28 (×3): 1 mg via ORAL
  Filled 2016-02-27 (×3): qty 1

## 2016-02-27 MED ORDER — SODIUM CHLORIDE 0.9 % IV BOLUS (SEPSIS)
1000.0000 mL | Freq: Once | INTRAVENOUS | Status: AC
  Administered 2016-02-27: 1000 mL via INTRAVENOUS

## 2016-02-27 MED ORDER — MAGNESIUM SULFATE 2 GM/50ML IV SOLN
2.0000 g | Freq: Once | INTRAVENOUS | Status: AC
  Administered 2016-02-27: 2 g via INTRAVENOUS
  Filled 2016-02-27: qty 50

## 2016-02-27 MED ORDER — POTASSIUM CHLORIDE 20 MEQ/15ML (10%) PO SOLN
40.0000 meq | Freq: Once | ORAL | Status: AC
  Administered 2016-02-27: 40 meq via ORAL
  Filled 2016-02-27: qty 30

## 2016-02-27 NOTE — ED Notes (Signed)
PT  ON  [facility]  WAITING LIST  PER  DR  Fanny BienQUALE

## 2016-02-27 NOTE — ED Provider Notes (Signed)
[facility] Emergency Department Provider Note   ____________________________________________   First MD Initiated Contact with Patient 02/27/16 1131     (approximate)  I have reviewed the triage vital signs and the nursing notes.   HISTORY  Chief Complaint Confusion, seizures, abdominal discomfort, nausea and vomiting  EM caveat: Limited due to patient confusion and difficulty recalling, patient slow in mentation  HPI Ashlee Hawkins is a 36 y.o. female who presents for evaluation after a seizure last night, multiple seizures over the last month including a couple earlier in the month after trying to stop alcohol for a day.  The patient's husband provides majority of history as the patient is slightly confused and very slow in her thoughts and mentation  Patient herself reports discomfort and vomiting with progressive feeling of fatigue and weakness. The patient herself denies chest pain to me, husband states the patient has been losing 30 pounds in the last couple months, he is no stridor yellow, she is confused at times, he is witnessed her having a few seizures including one that evidently occurred yesterday evening.  Husband reports patient drinks up to a half to 1 gallon of hard liquor daily  Past Medical History:  Diagnosis Date  . Alcoholism (HCC)   . Bipolar 1 disorder (HCC)   . Hypertension   . PTSD (post-traumatic stress disorder)     There are no active problems to display for this patient.   Past Surgical History:  Procedure Laterality Date  . CHOLECYSTECTOMY      Prior to Admission medications   Not on File    Allergies Patient has no known allergies.  No family history on file.  Social History Social History  Substance Use Topics  . Smoking status: Current Every Day Smoker    Packs/day: 1.00  . Smokeless tobacco: Never Used  . Alcohol use Yes     Comment: alcoholism    Review of Systems  Constitutional: No  fever/chills Eyes: No visual changes. ENT: No sore throat. Cardiovascular: Denies chest pain. Respiratory: Denies shortness of breath. Gastrointestinal: No abdominal pain.  No nausea, no vomiting.  No diarrhea.  No constipation. Genitourinary: Negative for dysuria. Musculoskeletal: Negative for back pain. Skin: Negative for rash. Neurological: Negative for headaches, focal weakness or numbness.  10-point ROS otherwise negative.  ____________________________________________   PHYSICAL EXAM:  VITAL SIGNS: ED Triage Vitals  Enc Vitals Group     BP --      Pulse --      Resp --      Temp --      Temp src --      SpO2 --      Weight 02/27/16 1118 120 lb (54.4 kg)     Height 02/27/16 1118 5\' 3"  (1.6 m)     Head Circumference --      Peak Flow --      Pain Score 02/27/16 1119 4     Pain Loc --      Pain Edu? --      Excl. in GC? --     Constitutional: Alert and orientedTo self, very slow in her mentation and answering questions. Alert.. Well appearing and in no acute distress. Eyes: Conjunctivae are Jaundiced and injected. PERRL. EOMI. Head: Atraumatic. Nose: No congestion/rhinnorhea. Mouth/Throat: Mucous membranes are moist.  Oropharynx non-erythematous. Neck: No stridor.   Cardiovascular: Slight tachycardic rate, regular rhythm. Grossly normal heart sounds.  Good peripheral circulation. Respiratory: Normal respiratory effort.  No retractions.  Lungs CTAB. Gastrointestinal: Soft and nontender.  Musculoskeletal: No lower extremity tenderness nor edema.  No joint effusions. Neurologic:  Normal speech and language. No gross focal neurologic deficits are appreciated. Mild high-frequency upper extremity tremor. Skin:  Skin is warm, dry and intact. No rash noted. Psychiatric: Mood and affect are normal. Speech and behavior are normal.  ____________________________________________   LABS (all labs ordered are listed, but only abnormal results are displayed)  Labs Reviewed    BASIC METABOLIC PANEL - Abnormal; Notable for the following:       Result Value   Sodium 131 (*)    Potassium <2.0 (*)    Chloride 65 (*)    CO2 38 (*)    Glucose, Bld 130 (*)    BUN <5 (*)    Creatinine, Ser 0.34 (*)    Calcium 8.6 (*)    Anion gap 28 (*)    All other components within normal limits  CBC - Abnormal; Notable for the following:    RBC 3.20 (*)    MCV 111.7 (*)    MCH 39.5 (*)    RDW 20.2 (*)    Platelets 101 (*)    All other components within normal limits  TROPONIN I - Abnormal; Notable for the following:    Troponin I 0.04 (*)    All other components within normal limits  HEPATIC FUNCTION PANEL - Abnormal; Notable for the following:    Albumin 2.8 (*)    AST 450 (*)    ALT 80 (*)    Alkaline Phosphatase 390 (*)    Total Bilirubin 14.8 (*)    Bilirubin, Direct 8.2 (*)    Indirect Bilirubin 6.6 (*)    All other components within normal limits  PROTIME-INR - Abnormal; Notable for the following:    Prothrombin Time 17.1 (*)    All other components within normal limits  APTT - Abnormal; Notable for the following:    aPTT 40 (*)    All other components within normal limits  ACETAMINOPHEN LEVEL - Abnormal; Notable for the following:    Acetaminophen (Tylenol), Serum <10 (*)    All other components within normal limits  ETHANOL - Abnormal; Notable for the following:    Alcohol, Ethyl (B) 96 (*)    All other components within normal limits  MAGNESIUM - Abnormal; Notable for the following:    Magnesium 1.4 (*)    All other components within normal limits  AMMONIA - Abnormal; Notable for the following:    Ammonia 36 (*)    All other components within normal limits  POTASSIUM - Abnormal; Notable for the following:    Potassium 3.1 (*)    All other components within normal limits  COMPREHENSIVE METABOLIC PANEL  POC URINE PREG, ED  POCT PREGNANCY, URINE  CBG MONITORING, ED   ____________________________________________  EKG  Reviewed and interpreted  by me at 11:20 AM Ventricular rate 105 QRS 90 QTc 600, underlying rhythm somewhat unclear appears possibly atrial fibrillation,  Nonspecific T-wave abnormality, including some depressions noted in a anterolateral distribution, QT prolongation, indeterminate rhythm (narrow complex) ____________________________________________  RADIOLOGY  Dg Chest 2 View  Result Date: 02/27/2016 CLINICAL DATA:  Chest pain EXAM: CHEST  2 VIEW COMPARISON:  04/04/2006 FINDINGS: Heart and mediastinal contours are within normal limits. No focal opacities or effusions. No acute bony abnormality. IMPRESSION: No active cardiopulmonary disease. Electronically Signed   By: Charlett NoseKevin  Dover M.D.   On: 02/27/2016 11:49   Ct Head Wo Contrast  Result Date: 02/27/2016 CLINICAL DATA:  Seizure activity and decreased responsiveness EXAM: CT HEAD WITHOUT CONTRAST TECHNIQUE: Contiguous axial images were obtained from the base of the skull through the vertex without intravenous contrast. COMPARISON:  None. FINDINGS: Brain: No evidence of acute infarction, hemorrhage, hydrocephalus, extra-axial collection or mass lesion/mass effect. Mild prominence of the cortical sulci is noted. Vascular: No hyperdense vessel or unexpected calcification. Skull: Normal. Negative for fracture or focal lesion. Sinuses/Orbits: No acute finding. Other: None. IMPRESSION: Mild sulcal prominence.  No other focal abnormality is seen. Electronically Signed   By: Alcide Clever M.D.   On: 02/27/2016 12:31   US Abdomen Limited Ruq  Result Date: 02/27/2016 CLINICAL DATA:  Abdominal pain EXAM: US ABDOMEN LIMITED - RIGHT UPPER QUADRANT COMPARISON:  None. FINDINGS: Gallbladder: Prior cholecystectomy Common bile duct: Diameter: Normal diameter, 6 mm. Liver: Increased echotexture compatible with fatty infiltration. No focal abnormality or biliary ductal dilatation. IMPRESSION: Fatty infiltration of the liver.  No acute findings. Prior cholecystectomy. Electronically Signed    By: Charlett Nose M.D.   On: 02/27/2016 15:21    ____________________________________________   PROCEDURES  Procedure(s) performed: None  Procedures  Critical Care performed: Yes, see critical care note(s)  CRITICAL CARE Performed by: Sharyn Creamer   Total critical care time: 50 minutes  Critical care time was exclusive of separately billable procedures and treating other patients.  Critical care was necessary to treat or prevent imminent or life-threatening deterioration.  Critical care was time spent personally by me on the following activities: development of treatment plan with patient and/or surrogate as well as nursing, discussions with consultants, evaluation of patient's response to treatment, examination of patient, obtaining history from patient or surrogate, ordering and performing treatments and interventions, ordering and review of laboratory studies, ordering and review of radiographic studies, pulse oximetry and re-evaluation of patient's condition.  Critical care time includes arranging transfer for a patient with severe hypokalemia evidenced by prolonged QT, also associated with what appears to be possible liver failure with jaundice. The patient is a heavy alcoholic, and I'm extremely concerned about her metabolic abnormalities as well as her liver function tests and history.  ____________________________________________   INITIAL IMPRESSION / ASSESSMENT AND PLAN / ED COURSE  Pertinent labs & imaging results that were available during my care of the patient were reviewed by me and considered in my medical decision making (see chart for details).  Patient presents with jaundice, reported nausea vomiting, heavy alcohol abuse, upper extremity tremors, prolonged QT and nonspecific T-wave abnormality and EKG. Patient appears to have stigmata of liver disease, primarily am concerned the patient has new onset of cirrhosis, seizures which in the past and evidently been due  to alcohol withdrawal as reported by the husband.     ----------------------------------------- 2:19 PM on 02/27/2016 -----------------------------------------  The patient is resting comfortable. She is alert, in no distress. Her orientation is now oriented 3, discussed transfer and currently we have now gastroenterology coverage at our hospital. Patient preference for [facility], however this is on diversion due to status incapacity, and after discussing with the patient and her husband as a second alternative there agreeable with transfer to the [facility] or [facility].  I have ordered repletion of the patient's potassium, in addition magnesium, and placed request for transfer.  Transfer accepted by Dr. Rolene Arbour at [facility].  Clinical Course as of Feb 27 1947  Wed Feb 27, 2016  1547 Patient stable, resting. No distress. Remains  alert without discomfort and vomiting are acute symptomatology. Spoke with [facility], place initial consultation but have not yet heard back from physician for acceptance. Continuing to wait on transfer plan  [MQ]  1622 Dr. Victoriano Lain calls, notes patient can be accepted in transfer AFTER potassium reaches 2.5. Plan to recheck after potassium has been repleted.   [MQ]    Clinical Course User Index [MQ] Sharyn Creamer, MD   Vitals:   02/27/16 1500 02/27/16 1521  BP: 100/71 100/71  Pulse: (!) 101 (!) 102  Resp: 14    ----------------------------------------- 5:45 PM on 02/27/2016 -----------------------------------------   Patient reassessed, vital signs stable. Alert, no distress. Patient alert. Potassium corrected, and patient accepted in transfer to [facility]. Awaiting bed assignment  ----------------------------------------- 7:45 PM on 02/27/2016 -----------------------------------------  Patient is rest comfortably. No ongoing distress. Patient and husband aware of plan, understanding awaiting bed to open at  local referral center for hepatology says gastroenterology area unfortunately, [place] does not have capacity accommodate, [facility] has accepted the patient is awaiting bed assignment.  Ongoing care of the patient assigned to Dr. Sharma Covert, patient accepted that awaiting bed at [facility]. Plan to recheck patient's LFTs and potassium in the morning after repletion, lactulose, and potassium written for tomorrow. Patient holding in the ER awaiting bed placement at Center where hepatology can consult on her. ____________________________________________   FINAL CLINICAL IMPRESSION(S) / ED DIAGNOSES  Final diagnoses:  Acute liver failure without hepatic coma  Abdominal pain  Hyperbilirubinemia  Alcoholic (HCC)  Liver failure without hepatic coma, unspecified chronicity (HCC)  Hypokalemia  Prolonged Q-T interval on ECG      NEW MEDICATIONS STARTED DURING THIS VISIT:  New Prescriptions   No medications on file     Note:  This document was prepared using Dragon voice recognition software and may include unintentional dictation errors.     Sharyn Creamer, MD 02/27/16 1949

## 2016-02-27 NOTE — ED Notes (Signed)
Patient transported to CT 

## 2016-02-27 NOTE — ED Notes (Signed)
Pt's husband called with update. Husband told [facility] but that [facility] are currently on diversion and once a room is available we will transfer her. Husband's cell # [phone].

## 2016-02-27 NOTE — ED Triage Notes (Signed)
Pt comes into the ED via POV with her husband c/o chest pain that has been going on for a couple of days as well as seeking help for detox.  Patient states she is a severe alcoholic and would like some help.  Patients last ETOH use was 8:30 this morning.  Patient presents with jaundice but in NAD at this time with even and unlabored respirations.  Patient is sluggish in responses and states she has nausea and dizziness associated with her chest pain.

## 2016-02-28 LAB — COMPREHENSIVE METABOLIC PANEL
ALT: 58 U/L — AB (ref 14–54)
AST: 364 U/L — AB (ref 15–41)
Albumin: 2.2 g/dL — ABNORMAL LOW (ref 3.5–5.0)
Alkaline Phosphatase: 345 U/L — ABNORMAL HIGH (ref 38–126)
Anion gap: 9 (ref 5–15)
CO2: 46 mmol/L — ABNORMAL HIGH (ref 22–32)
Calcium: 7.9 mg/dL — ABNORMAL LOW (ref 8.9–10.3)
Chloride: 76 mmol/L — ABNORMAL LOW (ref 101–111)
Glucose, Bld: 86 mg/dL (ref 65–99)
Potassium: 2.2 mmol/L — CL (ref 3.5–5.1)
Sodium: 131 mmol/L — ABNORMAL LOW (ref 135–145)
Total Bilirubin: 15.6 mg/dL — ABNORMAL HIGH (ref 0.3–1.2)
Total Protein: 5.5 g/dL — ABNORMAL LOW (ref 6.5–8.1)

## 2016-02-28 LAB — POTASSIUM: POTASSIUM: 3.2 mmol/L — AB (ref 3.5–5.1)

## 2016-02-28 MED ORDER — POTASSIUM CHLORIDE 2 MEQ/ML IV SOLN
Freq: Once | INTRAVENOUS | Status: AC
  Administered 2016-02-28: 10:00:00 via INTRAVENOUS
  Filled 2016-02-28: qty 1000

## 2016-02-28 MED ORDER — POTASSIUM CHLORIDE CRYS ER 20 MEQ PO TBCR
40.0000 meq | EXTENDED_RELEASE_TABLET | Freq: Once | ORAL | Status: AC
  Administered 2016-02-28: 40 meq via ORAL
  Filled 2016-02-28: qty 2

## 2016-02-28 NOTE — ED Provider Notes (Signed)
-----------------------------------------   6:55 AM on 02/28/2016 -----------------------------------------   Blood pressure 112/80, pulse 96, resp. rate (!) 25, height 5\' 3"  (1.6 m), weight 120 lb (54.4 kg), last menstrual period 02/04/2016, SpO2 94 %.  The patient had no acute events since last update.  Calm and cooperative at this time.  [facility]  Repeat CMP pending. She has orders for oral Ativan per CIWA protocol, lactulose and liquid diet.     Irean HongJade J Sung, MD 02/28/16 [phone]

## 2016-02-28 NOTE — ED Notes (Signed)
This RN called [facility] RN back and updated on all questions from first report. Husband called by this RN and updated on progress and transfer.

## 2016-02-28 NOTE — ED Notes (Signed)
Report off to andrea rn  

## 2016-02-28 NOTE — ED Notes (Signed)
Nurse from [facility] called, asking for update on pt status, states [facility].

## 2016-02-28 NOTE — ED Notes (Signed)
Abby, RN called by this RN and told pts new lab work. Husband called and updated on transfer.

## 2018-01-14 ENCOUNTER — Encounter: Payer: Self-pay | Admitting: *Deleted

## 2018-01-14 ENCOUNTER — Other Ambulatory Visit: Payer: Self-pay

## 2018-01-14 DIAGNOSIS — Z79899 Other long term (current) drug therapy: Secondary | ICD-10-CM | POA: Insufficient documentation

## 2018-01-14 DIAGNOSIS — I10 Essential (primary) hypertension: Secondary | ICD-10-CM | POA: Insufficient documentation

## 2018-01-14 DIAGNOSIS — R51 Headache: Secondary | ICD-10-CM | POA: Insufficient documentation

## 2018-01-14 DIAGNOSIS — E876 Hypokalemia: Secondary | ICD-10-CM | POA: Diagnosis not present

## 2018-01-14 DIAGNOSIS — F17210 Nicotine dependence, cigarettes, uncomplicated: Secondary | ICD-10-CM | POA: Insufficient documentation

## 2018-01-14 DIAGNOSIS — R5383 Other fatigue: Secondary | ICD-10-CM | POA: Insufficient documentation

## 2018-01-14 LAB — CBC
HCT: 38.2 % (ref 36.0–46.0)
Hemoglobin: 12.9 g/dL (ref 12.0–15.0)
MCH: 30.9 pg (ref 26.0–34.0)
MCHC: 33.8 g/dL (ref 30.0–36.0)
MCV: 91.4 fL (ref 80.0–100.0)
NRBC: 0 % (ref 0.0–0.2)
PLATELETS: 213 10*3/uL (ref 150–400)
RBC: 4.18 MIL/uL (ref 3.87–5.11)
RDW: 13.4 % (ref 11.5–15.5)
WBC: 6.4 10*3/uL (ref 4.0–10.5)

## 2018-01-14 LAB — BASIC METABOLIC PANEL
Anion gap: 9 (ref 5–15)
BUN: 10 mg/dL (ref 6–20)
CHLORIDE: 102 mmol/L (ref 98–111)
CO2: 21 mmol/L — ABNORMAL LOW (ref 22–32)
CREATININE: 0.61 mg/dL (ref 0.44–1.00)
Calcium: 8.3 mg/dL — ABNORMAL LOW (ref 8.9–10.3)
GFR calc Af Amer: >60 mL/min (ref >60)
GFR calc non Af Amer: >60 mL/min (ref >60)
Glucose, Bld: 106 mg/dL — ABNORMAL HIGH (ref 70–99)
Potassium: 3.2 mmol/L — ABNORMAL LOW (ref 3.5–5.1)
SODIUM: 132 mmol/L — AB (ref 135–145)

## 2018-01-14 NOTE — ED Triage Notes (Signed)
Pt brought in via ems from home.  Pt states her blood pressure has been elevated for the past 4 days.  Pt reports a headache.  Pt states she has been taking her medications.  Pt alert  Speech clear.

## 2018-01-14 NOTE — ED Notes (Addendum)
BP 154/90; pt brought in by EMS to lobby via w/c with no distress noted, currently talking on phone; pt with no c/o, just wants BP checked; +ETOH

## 2018-01-15 ENCOUNTER — Emergency Department
Admission: EM | Admit: 2018-01-15 | Discharge: 2018-01-15 | Disposition: A | Discharge status: Home / Self Care | Payer: Non-veteran care | Attending: Emergency Medicine | Admitting: Emergency Medicine

## 2018-01-15 DIAGNOSIS — E876 Hypokalemia: Secondary | ICD-10-CM

## 2018-01-15 DIAGNOSIS — I10 Essential (primary) hypertension: Secondary | ICD-10-CM

## 2018-01-15 LAB — TROPONIN I: Troponin I: <0.03 ng/mL (ref <0.03)

## 2018-01-15 MED ORDER — LISINOPRIL 10 MG PO TABS
10.0000 mg | ORAL_TABLET | Freq: Once | ORAL | Status: AC
Start: 2018-01-15 — End: 2018-01-15
  Administered 2018-01-15: 10 mg via ORAL
  Filled 2018-01-15: qty 1

## 2018-01-15 MED ORDER — POTASSIUM CHLORIDE CRYS ER 20 MEQ PO TBCR
40.0000 meq | EXTENDED_RELEASE_TABLET | Freq: Once | ORAL | Status: AC
  Administered 2018-01-15: 40 meq via ORAL
  Filled 2018-01-15: qty 2

## 2018-01-15 MED ORDER — AMLODIPINE BESYLATE 5 MG PO TABS
5.0000 mg | ORAL_TABLET | Freq: Once | ORAL | Status: AC
  Administered 2018-01-15: 5 mg via ORAL
  Filled 2018-01-15: qty 1

## 2018-01-15 MED ORDER — AMLODIPINE BESYLATE 5 MG PO TABS: 5.0000 mg | ORAL_TABLET | Freq: Every day | ORAL | 11 refills | Status: DC

## 2018-01-15 NOTE — ED Notes (Signed)
Registration at bedside.

## 2018-01-15 NOTE — ED Notes (Signed)
Report given to Kelly, RN at this time for a nutrition break 

## 2018-01-15 NOTE — ED Notes (Signed)
ED Provider at bedside. 

## 2018-01-15 NOTE — ED Notes (Signed)
Pt verbalized understanding of d/c instructions, RX, and f/u care. No further questions at this time. Pt ambulatory to the lobby with steady gait.

## 2018-01-15 NOTE — Discharge Instructions (Addendum)
As we discussed, though you do have high blood pressure (hypertension), fortunately it is not immediately dangerous at this time and does not need emergency intervention or admission to the hospital.  Because you are on only a small dose of one blood pressure medication, you may benefit from an additional medication.  I prescribed a new medication called amlodipine that you should take once daily in addition to all of your other medications.  That way when you follow up with your regular doctor next week, you can see how much your blood pressure has improved.  Be sure and tell your doctor what you were prescribed or show her/him this paperwork.  Return to the Emergency Department (ED) if you experience any worsening chest pain/pressure/tightness, difficulty breathing, or sudden sweating, or other symptoms that concern you.

## 2018-01-15 NOTE — ED Notes (Signed)
Pt placed on cardiac monitor, Spo2 and BP monitoring. Initial assessment comepleted. Call bell within reach. Awaits provider for evaluation at this time.

## 2018-01-15 NOTE — ED Provider Notes (Signed)
[facility] Emergency Department Provider Note  ____________________________________________   First MD Initiated Contact with Patient 01/15/18 0128     (approximate)  I have reviewed the triage vital signs and the nursing notes.   HISTORY  Chief Complaint Hypertension    HPI Ashlee Hawkins is a 37 y.o. female with medical history as listed below who presents for evaluation of high blood pressure.  She reports that her numbers have been running high for the last few days and she has been feeling an intermittent and waxing and waning headache, fatigue, and occasional chest pain although not chest pain today.  All of her symptoms are mild except for the numbers which have been significantly elevated.  She reports that she called her primary care doctor's office and the nurse line told her she should come to the emergency department.  She is in no distress at this time and denies any pain.  She denies fever/chills, nausea, vomiting, abdominal pain, and shortness of breath.  She has had no dysuria.  Nothing in particular makes it better or worse and she has been taking her medications.  She is not certain what medications she takes for blood pressure but she did provide [place] with a list and it is notable only for lisinopril 10 mg daily in addition to her other medications unrelated to blood pressure.  Past Medical History:  Diagnosis Date  . Alcoholism (HCC)   . Bipolar 1 disorder (HCC)   . Hypertension   . PTSD (post-traumatic stress disorder)     There are no active problems to display for this patient.   Past Surgical History:  Procedure Laterality Date  . CHOLECYSTECTOMY      Prior to Admission medications   Medication Sig Start Date End Date Taking? Authorizing Provider  diclofenac sodium (VOLTAREN) 1 % GEL Apply 2 g topically 4 (four) times daily as needed (pain).   Yes [provider]  ibuprofen (ADVIL,MOTRIN) 800 MG tablet Take 800 mg by  mouth 2 (two) times daily as needed (pain/ inflammation).    Yes [provider]  lisinopril (PRINIVIL,ZESTRIL) 20 MG tablet Take 10 mg by mouth daily.   Yes [provider]  Melatonin 3 MG TABS Take 3 mg by mouth at bedtime.   Yes [provider]  naltrexone (DEPADE) 50 MG tablet Take 50 mg by mouth daily.   Yes [provider]  oxcarbazepine (TRILEPTAL) 600 MG tablet Take 600 mg by mouth 2 (two) times daily.   Yes [provider]  sertraline (ZOLOFT) 100 MG tablet Take 50 mg by mouth daily.   Yes [provider]  amLODipine (NORVASC) 5 MG tablet Take 1 tablet (5 mg total) by mouth daily. 01/15/18 01/15/19  Loleta Rose, MD    Allergies Patient has no known allergies.  No family history on file.  Social History Social History   Tobacco Use  . Smoking status: Current Every Day Smoker    Packs/day: 1.00  . Smokeless tobacco: Never Used  Substance Use Topics  . Alcohol use: Yes    Comment: alcoholism  . Drug use: No    Review of Systems Constitutional: No fever/chills.  General fatigue. Eyes: No visual changes. ENT: No sore throat. Cardiovascular: Occasional mild intermittent chest pain, not currently and not for 24 hours. Respiratory: Denies shortness of breath. Gastrointestinal: No abdominal pain.  No nausea, no vomiting.  No diarrhea.  No constipation. Genitourinary: Negative for dysuria. Musculoskeletal: Negative for neck pain.  Negative for back pain. Integumentary: Negative for rash. Neurological: Negative for headaches, focal weakness or numbness.   ____________________________________________   PHYSICAL EXAM:  VITAL SIGNS: ED Triage Vitals  Enc Vitals Group     BP 01/14/18 1947 (!) 132/112     Pulse Rate 01/14/18 1947 81     Resp 01/14/18 1947 16     Temp 01/14/18 1947 97.8 F (36.6 C)     Temp Source 01/14/18 1947 Oral     SpO2 01/14/18 1947 97 %     Weight 01/14/18 1947 90.7 kg (200 lb)     Height  01/14/18 1947 1.575 m (5\' 2" )     Head Circumference --      Peak Flow --      Pain Score 01/14/18 2004 8     Pain Loc --      Pain Edu? --      Excl. in GC? --     Constitutional: Alert and oriented. Well appearing and in no acute distress. Eyes: Conjunctivae are normal.  Head: Atraumatic. Nose: No congestion/rhinnorhea. Mouth/Throat: Mucous membranes are moist. Neck: No stridor.  No meningeal signs.   Cardiovascular: Normal rate, regular rhythm. Good peripheral circulation. Grossly normal heart sounds. Respiratory: Normal respiratory effort.  No retractions. Lungs CTAB. Gastrointestinal: Soft and nontender. No distention.  Musculoskeletal: No lower extremity tenderness nor edema. No gross deformities of extremities. Neurologic:  Normal speech and language. No gross focal neurologic deficits are appreciated.  Skin:  Skin is warm, dry and intact. No rash noted. Psychiatric: Mood and affect are normal. Speech and behavior are normal.  ____________________________________________   LABS (all labs ordered are listed, but only abnormal results are displayed)  Labs Reviewed  BASIC METABOLIC PANEL - Abnormal; Notable for the following components:      Result Value   Sodium 132 (*)    Potassium 3.2 (*)    CO2 21 (*)    Glucose, Bld 106 (*)    Calcium 8.3 (*)    All other components within normal limits  CBC  TROPONIN I   ____________________________________________  EKG  ED ECG REPORT I, Loleta Rose, the attending physician, personally viewed and interpreted this ECG.  Date: 01/15/2018 EKG Time: 1:59 AM Rate: 69 Rhythm: normal sinus rhythm with borderline first-degree AV block QRS Axis: normal Intervals: Borderline first-degree AV block with a PR interval of 202 ms ST/T Wave abnormalities: normal Narrative Interpretation: no evidence of acute ischemia  ____________________________________________  RADIOLOGY   ED MD interpretation: No indication for  imaging  Official radiology report(s): No results found.  ____________________________________________   PROCEDURES  Critical Care performed: No   Procedure(s) performed:   Procedures   ____________________________________________   INITIAL IMPRESSION / ASSESSMENT AND PLAN / ED COURSE  As part of my medical decision making, I reviewed the following data within the electronic MEDICAL RECORD NUMBER Nursing notes reviewed and incorporated, Labs reviewed , EKG interpreted  and Old chart reviewed    Essential hypertension uncontrolled with current regimen, hypertensive urgency/emergency, acute infection, much less likely CVA, renal failure, etc.  Her exam work-up is reassuring.  Other than significantly elevated blood pressure with a diastolic greater than 100 and an extremely variable systolic ranging from 132 to 180, she is hemodynamically stable.  She has normal CBC and a normal basic metabolic panel except for a slightly low potassium of unknown clinical significance but for which I have provided 40 mEq of potassium repletion by mouth.  Troponin negative and EKG is  normal.  There is no evidence of hypertensive emergency or urgency and she is in no distress.  There is no indication to repeat any additional blood work.  Given that she is only on lisinopril 10 mg daily I gave her an extra dose of 10 mg of lisinopril and also a dose of amlodipine 5 mg by mouth.  I will write her prescription for the amlodipine as well so that she can try taking that in addition to the lisinopril.  She states that she already has an appointment with her primary care doctor next week which will be a good time for follow-up to see if the amlodipine is helping her.  I gave my usual customary return precautions and she understands and agrees with the plan.     ____________________________________________  FINAL CLINICAL IMPRESSION(S) / ED DIAGNOSES  Final diagnoses:  Essential hypertension  Hypokalemia      MEDICATIONS GIVEN DURING THIS VISIT:  Medications  lisinopril (PRINIVIL,ZESTRIL) tablet 10 mg (10 mg Oral Given 01/15/18 0205)  amLODipine (NORVASC) tablet 5 mg (5 mg Oral Given 01/15/18 0205)  potassium chloride SA (K-DUR,KLOR-CON) CR tablet 40 mEq (40 mEq Oral Given 01/15/18 0208)     ED Discharge Orders         Ordered    amLODipine (NORVASC) 5 MG tablet  Daily     01/15/18 0204           Note:  This document was prepared using Dragon voice recognition software and may include unintentional dictation errors.    Loleta RoseForbach, Britnay Magnussen, MD 01/15/18 [phone]

## 2018-08-05 ENCOUNTER — Ambulatory Visit
Admission: EM | Admit: 2018-08-05 | Discharge: 2018-08-05 | Disposition: A | Discharge status: Nursing Home (Includes ICF) | Payer: No Typology Code available for payment source | Attending: Family Medicine | Admitting: Family Medicine

## 2018-08-05 ENCOUNTER — Other Ambulatory Visit: Payer: Self-pay

## 2018-08-05 DIAGNOSIS — R197 Diarrhea, unspecified: Secondary | ICD-10-CM

## 2018-08-05 DIAGNOSIS — R21 Rash and other nonspecific skin eruption: Secondary | ICD-10-CM

## 2018-08-05 DIAGNOSIS — R112 Nausea with vomiting, unspecified: Secondary | ICD-10-CM

## 2018-08-05 DIAGNOSIS — R0602 Shortness of breath: Secondary | ICD-10-CM

## 2018-08-05 DIAGNOSIS — E860 Dehydration: Secondary | ICD-10-CM

## 2018-08-05 LAB — CBC WITH DIFFERENTIAL/PLATELET
Abs Immature Granulocytes: 0.03 10*3/uL (ref 0.00–0.07)
Basophils Absolute: 0.1 10*3/uL (ref 0.0–0.1)
Basophils Relative: 1 %
Eosinophils Absolute: 0.1 10*3/uL (ref 0.0–0.5)
Eosinophils Relative: 2 %
HCT: 46.6 % — ABNORMAL HIGH (ref 36.0–46.0)
Hemoglobin: 16.2 g/dL — ABNORMAL HIGH (ref 12.0–15.0)
Immature Granulocytes: 0 %
Lymphocytes Relative: 30 %
Lymphs Abs: 2.1 10*3/uL (ref 0.7–4.0)
MCH: 32.8 pg (ref 26.0–34.0)
MCHC: 34.8 g/dL (ref 30.0–36.0)
MCV: 94.3 fL (ref 80.0–100.0)
Monocytes Absolute: 0.4 10*3/uL (ref 0.1–1.0)
Monocytes Relative: 5 %
Neutro Abs: 4.4 10*3/uL (ref 1.7–7.7)
Neutrophils Relative %: 62 %
Platelets: 273 10*3/uL (ref 150–400)
RBC: 4.94 MIL/uL (ref 3.87–5.11)
RDW: 14.4 % (ref 11.5–15.5)
WBC: 7.1 10*3/uL (ref 4.0–10.5)
nRBC: 0 % (ref 0.0–0.2)

## 2018-08-05 LAB — COMPREHENSIVE METABOLIC PANEL
ALT: 81 U/L — ABNORMAL HIGH (ref 0–44)
AST: 105 U/L — ABNORMAL HIGH (ref 15–41)
Albumin: 4.8 g/dL (ref 3.5–5.0)
Alkaline Phosphatase: 146 U/L — ABNORMAL HIGH (ref 38–126)
Anion gap: 18 — ABNORMAL HIGH (ref 5–15)
BUN: 14 mg/dL (ref 6–20)
CO2: 18 mmol/L — ABNORMAL LOW (ref 22–32)
Calcium: 8.8 mg/dL — ABNORMAL LOW (ref 8.9–10.3)
Chloride: 100 mmol/L (ref 98–111)
Creatinine, Ser: 0.72 mg/dL (ref 0.44–1.00)
GFR calc Af Amer: >60 mL/min (ref >60)
GFR calc non Af Amer: >60 mL/min (ref >60)
Glucose, Bld: 132 mg/dL — ABNORMAL HIGH (ref 70–99)
Potassium: 4.4 mmol/L (ref 3.5–5.1)
Sodium: 136 mmol/L (ref 135–145)
Total Bilirubin: 0.5 mg/dL (ref 0.3–1.2)
Total Protein: 8.4 g/dL — ABNORMAL HIGH (ref 6.5–8.1)

## 2018-08-05 LAB — LIPASE, BLOOD: Lipase: 30 U/L (ref 11–51)

## 2018-08-05 MED ORDER — ONDANSETRON 4 MG PO TBDP
4.0000 mg | ORAL_TABLET | Freq: Once | ORAL | Status: AC
  Administered 2018-08-05: 4 mg via ORAL

## 2018-08-05 NOTE — Discharge Instructions (Signed)
Please go to the ER.   Address:  [address] [place] [address]  Take care  Dr. Lacinda Axon

## 2018-08-05 NOTE — ED Triage Notes (Signed)
Pt here for fatigue, SOB, vomiting and rash on her abdomen that moved to her back and states the symptoms presented after the rash. Looks like the rash is bleeding due to the blood on her bra. Unable to take deep breaths. Diarrhea and lack of appetite.

## 2018-08-05 NOTE — ED Provider Notes (Signed)
[facility]    CSN: [id number] Arrival date & time: 08/05/18  1117      History   Chief Complaint Chief Complaint  Patient presents with  . Fatigue  . Emesis   HPI  38 year old female presents with the above complaints.  Patient is a known alcoholic.  Patient reports a 2-day history of nausea, vomiting, diarrhea.  She states that she is unable to keep anything down.  Cannot tolerate fluids either.  Reports associated shortness of breath.  Also has a rash of her abdomen extending around her back.  She states that she is had a rash for "less than a week".  She reports change in appetite.  Patient reports that she drinks approximately 8 shots a day.  She reports that she is experiencing lightheadedness.  Feels as if she is going to pass out.  No reported sick contacts.  No relieving factors.  No other associated symptoms.  No other complaints.  PMH, Surgical Hx, Family Hx, Social History reviewed and updated as below.  Past Medical History:  Diagnosis Date  . Alcoholism (HCC)   . Bipolar 1 disorder (HCC)   . Hypertension   . PTSD (post-traumatic stress disorder)    Past Surgical History:  Procedure Laterality Date  . CHOLECYSTECTOMY     OB History   No obstetric history on file.    Home Medications    Prior to Admission medications   Medication Sig Start Date End Date Taking? Authorizing Provider  amLODipine (NORVASC) 5 MG tablet Take 1 tablet (5 mg total) by mouth daily. 01/15/18 01/15/19 Yes Loleta RoseForbach, Cory, MD  diclofenac sodium (VOLTAREN) 1 % GEL Apply 2 g topically 4 (four) times daily as needed (pain).   Yes [provider]  ibuprofen (ADVIL,MOTRIN) 800 MG tablet Take 800 mg by mouth 2 (two) times daily as needed (pain/ inflammation).    Yes [provider]  lisinopril (PRINIVIL,ZESTRIL) 20 MG tablet Take 10 mg by mouth daily.   Yes [provider]  Melatonin 3 MG TABS Take 3 mg by mouth at bedtime.   Yes [provider]   naltrexone (DEPADE) 50 MG tablet Take 50 mg by mouth daily.   Yes [provider]  oxcarbazepine (TRILEPTAL) 600 MG tablet Take 600 mg by mouth 2 (two) times daily.   Yes [provider]  sertraline (ZOLOFT) 100 MG tablet Take 50 mg by mouth daily.   Yes [provider]   Family History Family History  Problem Relation Age of Onset  . Heart failure Mother    Social History Social History   Tobacco Use  . Smoking status: Current Every Day Smoker    Packs/day: 1.00  . Smokeless tobacco: Never Used  Substance Use Topics  . Alcohol use: Yes    Comment: alcoholism  . Drug use: No   Allergies   Patient has no known allergies.  Review of Systems Review of Systems  Constitutional: Positive for appetite change and fatigue.  Respiratory: Positive for shortness of breath.   Gastrointestinal: Positive for diarrhea, nausea and vomiting.  Neurological: Positive for light-headedness.   Physical Exam Triage Vital Signs ED Triage Vitals  Enc Vitals Group     BP 08/05/18 1128 (!) 155/125     Pulse Rate 08/05/18 1128 (!) 128     Resp 08/05/18 1128 18     Temp 08/05/18 1128 98.5 F (36.9 C)     Temp Source 08/05/18 1128 Oral     SpO2 08/05/18  1128 99 %     Weight 08/05/18 1131 190 lb (86.2 kg)     Height --      Head Circumference --      Peak Flow --      Pain Score 08/05/18 1131 8     Pain Loc --      Pain Edu? --      Excl. in GC? --    Updated Vital Signs BP (!) 155/125 (BP Location: Right Arm)   Pulse (!) 128   Temp 98.5 F (36.9 C) (Oral)   Resp 18   Wt 86.2 kg   LMP 07/27/2018 (Exact Date)   SpO2 99%   BMI 34.75 kg/m   Visual Acuity Right Eye Distance:   Left Eye Distance:   Bilateral Distance:    Right Eye Near:   Left Eye Near:    Bilateral Near:     Physical Exam Constitutional:      Comments: Ill-appearing obese female in no apparent distress.  Appears older than stated age.  HENT:     Head: Normocephalic and atraumatic.      Mouth/Throat:     Pharynx: Oropharynx is clear.  Eyes:     General:        Right eye: No discharge.        Left eye: No discharge.     Conjunctiva/sclera: Conjunctivae normal.  Cardiovascular:     Rate and Rhythm: Regular rhythm. Tachycardia present.  Pulmonary:     Effort: Pulmonary effort is normal.     Breath sounds: Normal breath sounds.  Abdominal:     General: There is no distension.     Palpations: Abdomen is soft.     Tenderness: There is no abdominal tenderness.  Psychiatric:     Comments: Flat affect.  Depressed mood.    UC Treatments / Results  Labs (all labs ordered are listed, but only abnormal results are displayed) Labs Reviewed  COMPREHENSIVE METABOLIC PANEL - Abnormal; Notable for the following components:      Result Value   CO2 18 (*)    Glucose, Bld 132 (*)    Calcium 8.8 (*)    Total Protein 8.4 (*)    AST 105 (*)    ALT 81 (*)    Alkaline Phosphatase 146 (*)    Anion gap 18 (*)    All other components within normal limits  CBC WITH DIFFERENTIAL/PLATELET - Abnormal; Notable for the following components:   Hemoglobin 16.2 (*)    HCT 46.6 (*)    All other components within normal limits  LIPASE, BLOOD    EKG   Radiology No results found.  Procedures Procedures (including critical care time)  Medications Ordered in UC Medications  ondansetron (ZOFRAN-ODT) disintegrating tablet 4 mg (4 mg Oral Given 08/05/18 1234)    Initial Impression / Assessment and Plan / UC Course  I have reviewed the triage vital signs and the nursing notes.  Pertinent labs & imaging results that were available during my care of the patient were reviewed by me and considered in my medical decision making (see chart for details).    38 year old ill-appearing female presents with nausea, vomiting, diarrhea.  Patient clinically dehydrated.  Laboratory studies revealed hemoconcentration with a hemoglobin of 16.2.  She also has a CO2 of 18 suggesting acidosis.  Anion  gap 18.  Alcohol likely playing a role.  Elevated liver enzymes noted.  Patient initially reluctant to go to the hospital.  However, she later  complied.  Her father is taking her directly to [facility]  I spoken with him on the phone.  Final Clinical Impressions(s) / UC Diagnoses   Final diagnoses:  Nausea vomiting and diarrhea  Dehydration  Rash and nonspecific skin eruption  SOB (shortness of breath)     Discharge Instructions     Please go to the ER.   Address:  [address] [place] [address]  Take care  Dr. Lacinda Axon    ED Prescriptions    None     Controlled Substance Prescriptions [place] Controlled Substance Registry consulted? Not Applicable   Coral Spikes, DO 08/05/18 1327

## 2019-06-01 ENCOUNTER — Encounter: Payer: Self-pay | Admitting: *Deleted

## 2019-06-01 ENCOUNTER — Other Ambulatory Visit: Payer: Self-pay

## 2019-06-01 ENCOUNTER — Emergency Department
Admission: EM | Admit: 2019-06-01 | Discharge: 2019-06-01 | Disposition: A | Discharge status: Home / Self Care | Payer: No Typology Code available for payment source | Attending: Emergency Medicine | Admitting: Emergency Medicine

## 2019-06-01 DIAGNOSIS — R519 Headache, unspecified: Secondary | ICD-10-CM | POA: Diagnosis not present

## 2019-06-01 DIAGNOSIS — Z5321 Procedure and treatment not carried out due to patient leaving prior to being seen by health care provider: Secondary | ICD-10-CM | POA: Insufficient documentation
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
## 2019-06-02 ENCOUNTER — Emergency Department
Admission: EM | Admit: 2019-06-02 | Discharge: 2019-06-02 | Disposition: A | Discharge status: Home / Self Care | Payer: No Typology Code available for payment source | Attending: Emergency Medicine | Admitting: Emergency Medicine

## 2019-06-02 ENCOUNTER — Other Ambulatory Visit: Payer: Self-pay

## 2019-06-02 ENCOUNTER — Emergency Department: Payer: No Typology Code available for payment source

## 2019-06-02 DIAGNOSIS — I10 Essential (primary) hypertension: Secondary | ICD-10-CM | POA: Diagnosis not present

## 2019-06-02 DIAGNOSIS — S161XXA Strain of muscle, fascia and tendon at neck level, initial encounter: Secondary | ICD-10-CM | POA: Diagnosis not present

## 2019-06-02 DIAGNOSIS — Z79899 Other long term (current) drug therapy: Secondary | ICD-10-CM | POA: Diagnosis not present

## 2019-06-02 DIAGNOSIS — R110 Nausea: Secondary | ICD-10-CM | POA: Diagnosis not present

## 2019-06-02 DIAGNOSIS — Y92018 Other place in single-family (private) house as the place of occurrence of the external cause: Secondary | ICD-10-CM | POA: Insufficient documentation

## 2019-06-02 DIAGNOSIS — S0990XA Unspecified injury of head, initial encounter: Secondary | ICD-10-CM

## 2019-06-02 DIAGNOSIS — R410 Disorientation, unspecified: Secondary | ICD-10-CM | POA: Diagnosis not present

## 2019-06-02 DIAGNOSIS — S060X0A Concussion without loss of consciousness, initial encounter: Secondary | ICD-10-CM | POA: Diagnosis not present

## 2019-06-02 DIAGNOSIS — W2209XA Striking against other stationary object, initial encounter: Secondary | ICD-10-CM | POA: Insufficient documentation

## 2019-06-02 DIAGNOSIS — F17210 Nicotine dependence, cigarettes, uncomplicated: Secondary | ICD-10-CM | POA: Insufficient documentation

## 2019-06-02 DIAGNOSIS — Y998 Other external cause status: Secondary | ICD-10-CM | POA: Diagnosis not present

## 2019-06-02 DIAGNOSIS — Y9383 Activity, rough housing and horseplay: Secondary | ICD-10-CM | POA: Insufficient documentation

## 2019-06-02 IMAGING — CT CT CERVICAL SPINE W/O CM
3 of 4 series · 10 of 33 positions shown, 12 images · non-contrast
Comparison: CT head [DATE]

CLINICAL DATA: Head injury 2 days ago.  Headache.

EXAM:
CT HEAD WITHOUT CONTRAST
CT CERVICAL SPINE WITHOUT CONTRAST
TECHNIQUE: Multidetector CT imaging of the head and cervical spine was
performed following the standard protocol without intravenous
contrast. Multiplanar CT image reconstructions of the cervical spine
were also generated.

[Series 6: sagittal bone · sagittal · 0.22mm/px · 5 of 57 slices shown, 6 images]
[im 19/57  bone]
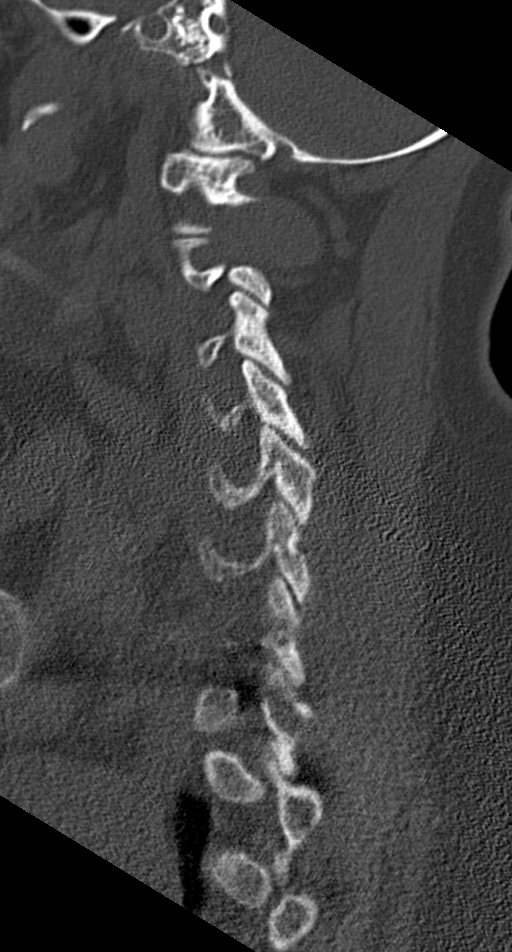
[im 24/57  bone]
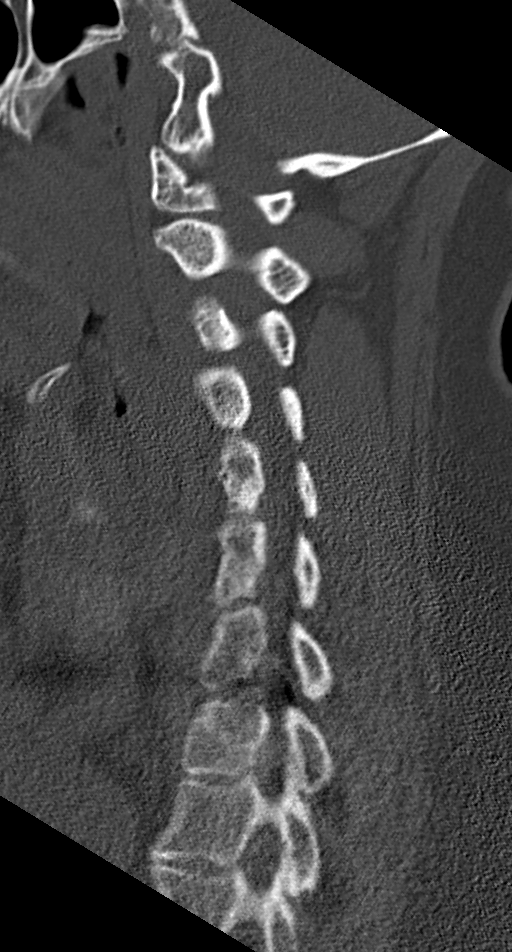
[im 29/57  soft-tissue]
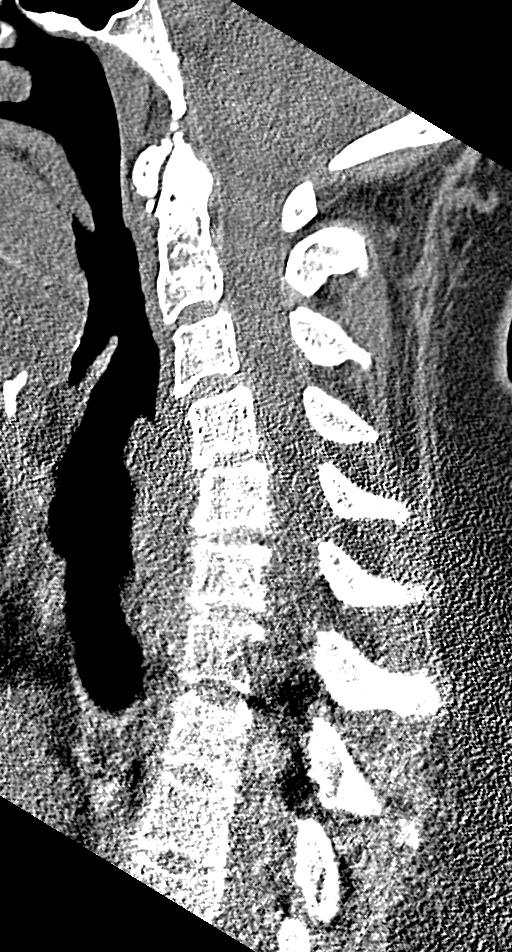
[im 29/57  bone]
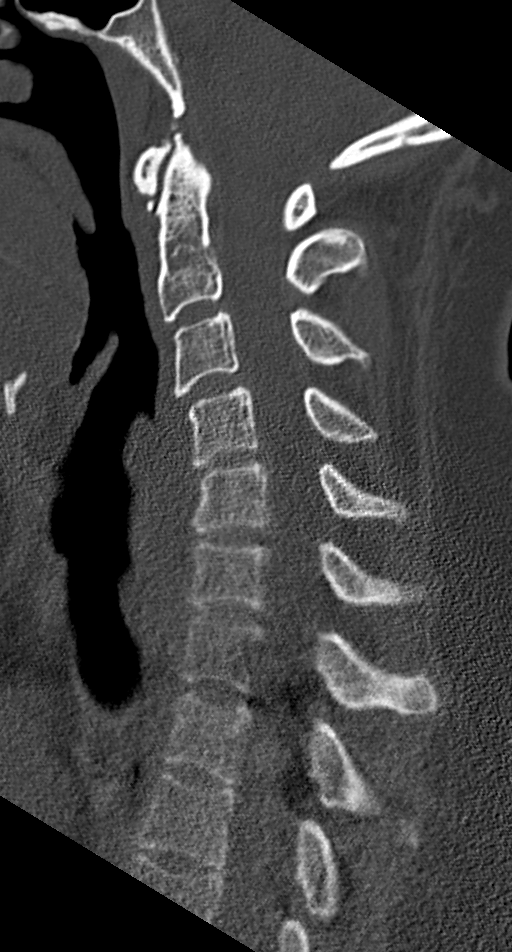
[im 33/57  bone]
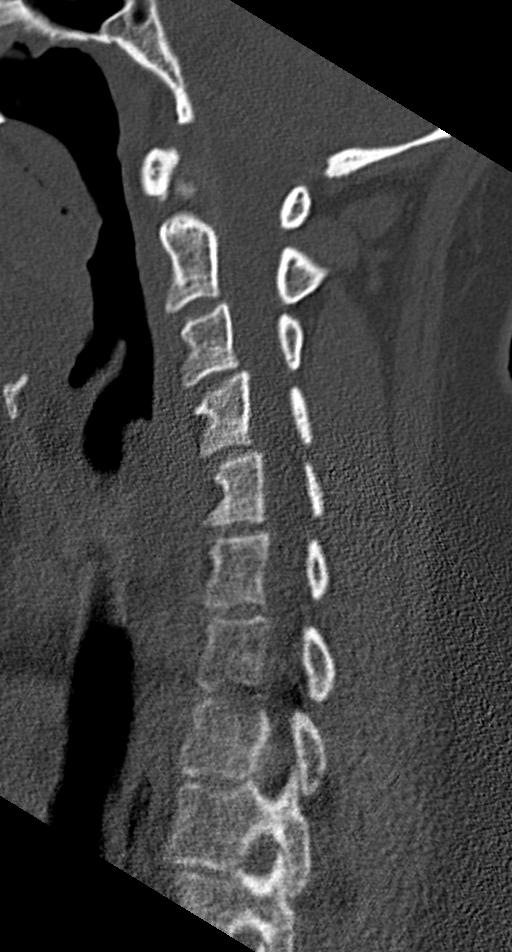
[im 38/57  bone]
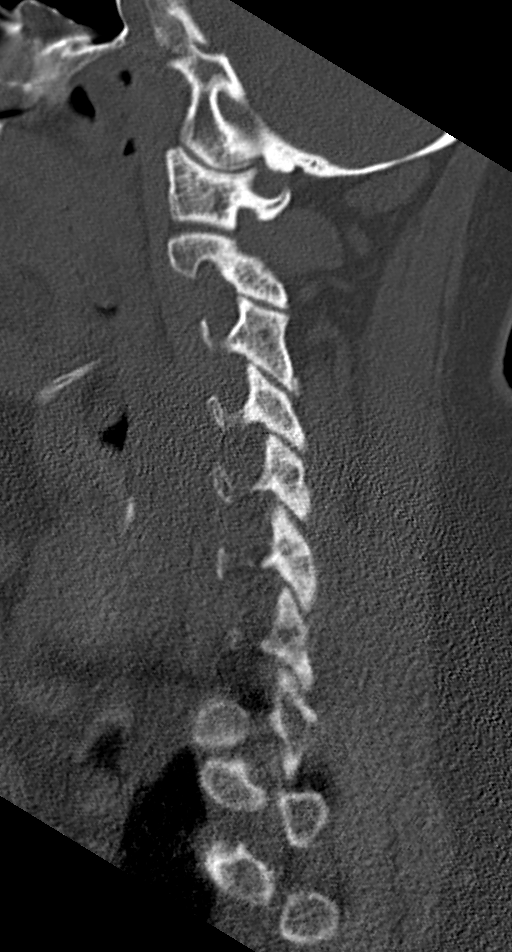

[Series 7: coronal bone · coronal · 0.22mm/px · 3 of 57 slices shown]
[im 15/57  bone]
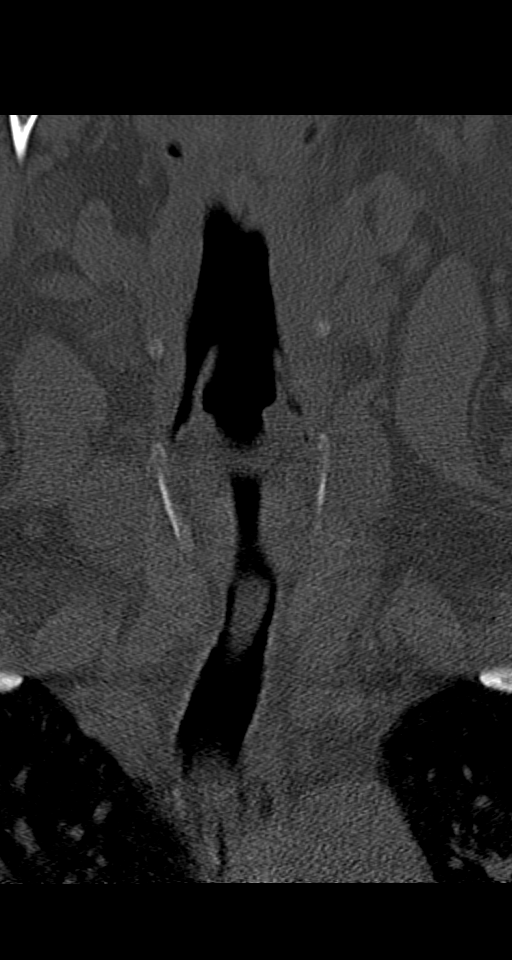
[im 24/57  bone]
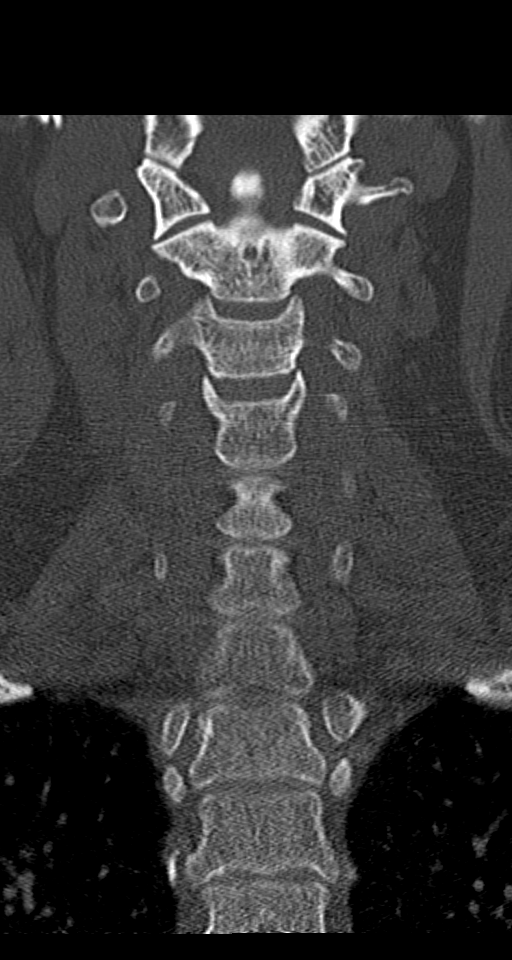
[im 33/57  bone]
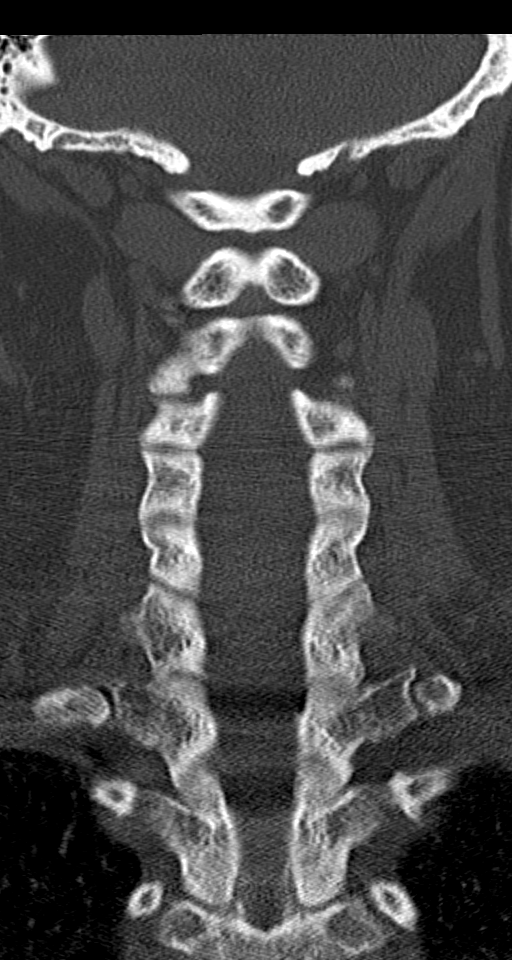

[Series 8: orthogonal bone · axial · 0.22mm/px · z∈[-296,-219]mm · 2 of 106 slices shown, 3 images]
[im 31/106  soft-tissue]
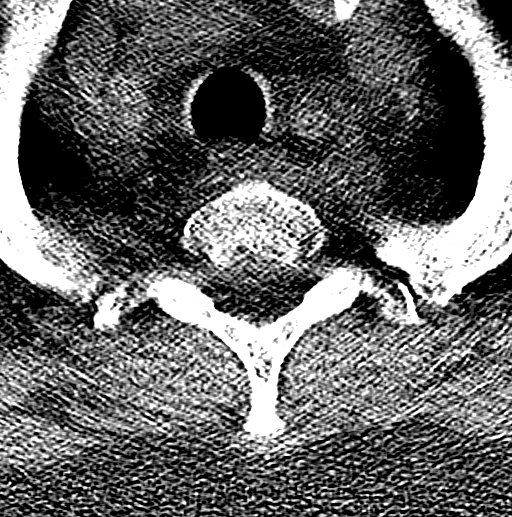
[im 31/106  bone]
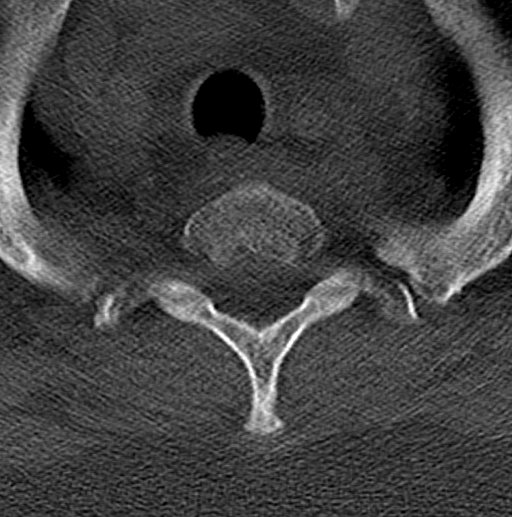
[im 76/106  bone]
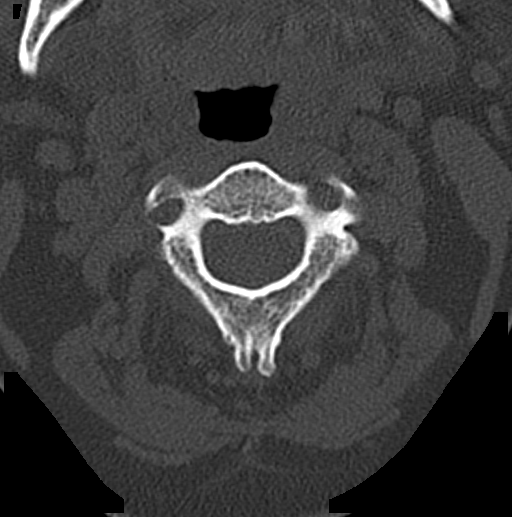

[10 of 33 positions shown; findings below may reference images not displayed]

FINDINGS: CT HEAD FINDINGS

Brain: No evidence of acute infarction, hemorrhage, hydrocephalus,
extra-axial collection or mass lesion/mass effect.

Vascular: Negative for hyperdense vessel

Skull: Negative for fracture

Sinuses/Orbits: Mild mucosal edema paranasal sinuses. Negative orbit

Other: None

CT CERVICAL SPINE FINDINGS

Alignment: Mild anterolisthesis C3-4 and C4-5. Accentuated cervical
kyphosis likely due to position.

Skull base and vertebrae: Negative for fracture

Soft tissues and spinal canal: Negative

Disc levels: No significant disc degeneration or spurring. Normal
facet joints.

Upper chest: Lung apices clear bilaterally.

Other: None
IMPRESSION: Negative CT head

Negative CT cervical spine

## 2019-06-02 IMAGING — CT CT HEAD W/O CM
3 series · 16 of 47 positions shown, 19 images · non-contrast
Comparison: CT head [DATE]

CLINICAL DATA: Head injury 2 days ago.  Headache.

EXAM:
CT HEAD WITHOUT CONTRAST
CT CERVICAL SPINE WITHOUT CONTRAST
TECHNIQUE: Multidetector CT imaging of the head and cervical spine was
performed following the standard protocol without intravenous
contrast. Multiplanar CT image reconstructions of the cervical spine
were also generated.

[Series 2: head wo · axial · 0.47mm/px · z∈[-157,-32]mm · 10 of 30 slices shown, 13 images]
[im 3/30  brain]
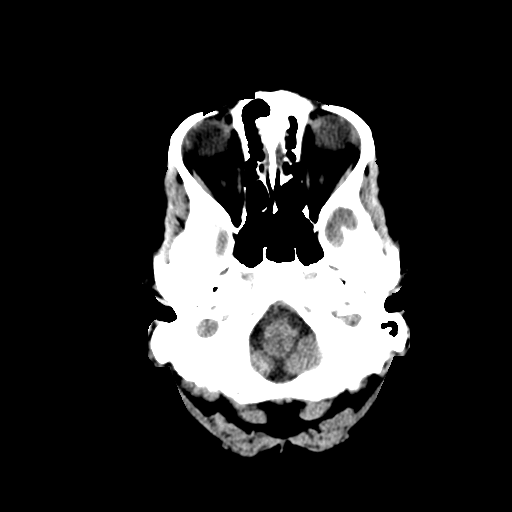
[im 3/30  bone]
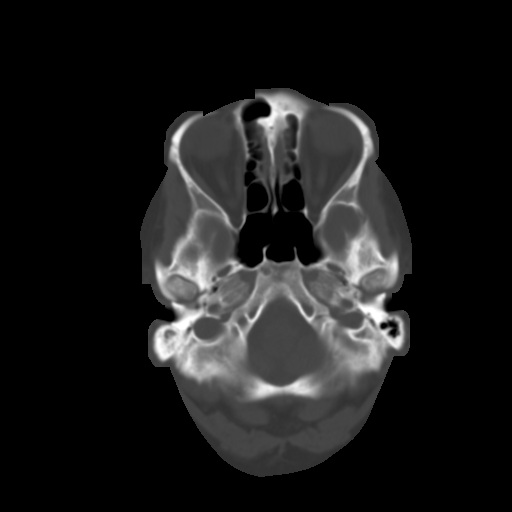
[im 6/30  brain]
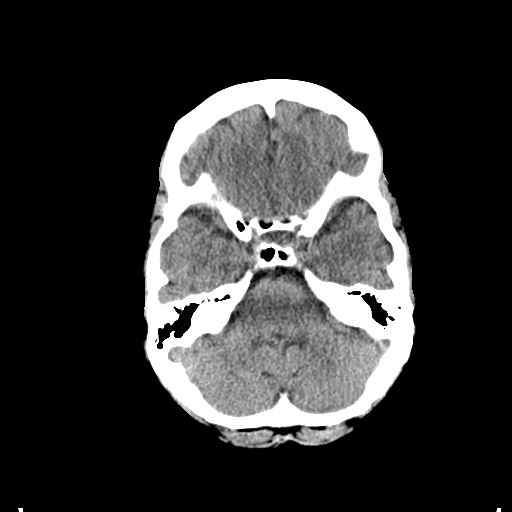
[im 9/30  brain]
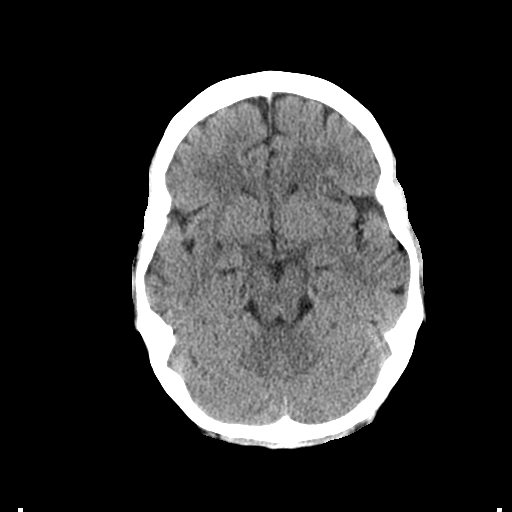
[im 11/30  brain]
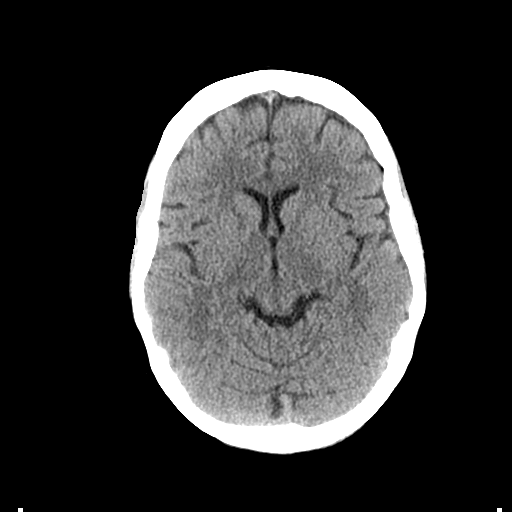
[im 14/30  brain]
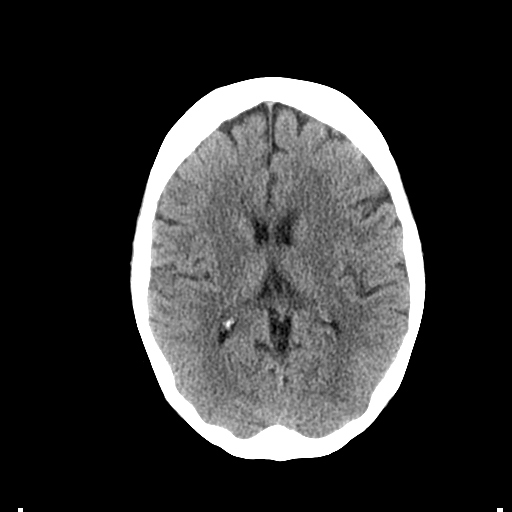
[im 14/30  bone]
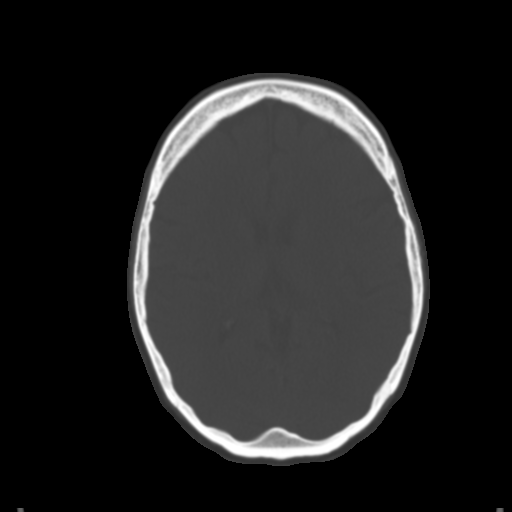
[im 17/30  brain]
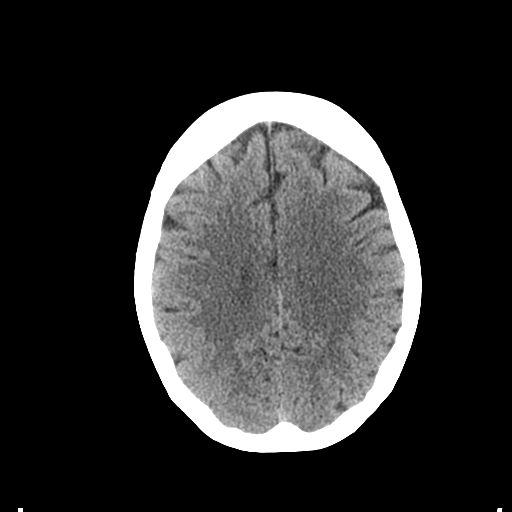
[im 20/30  brain]
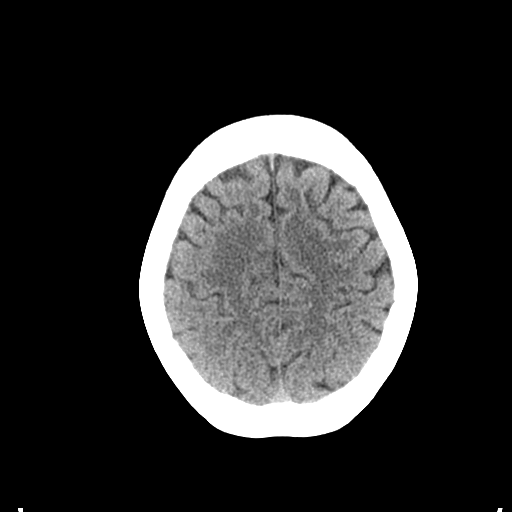
[im 23/30  brain]
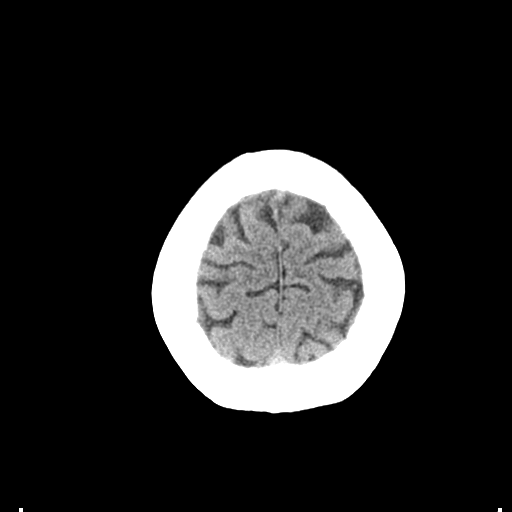
[im 25/30  brain]
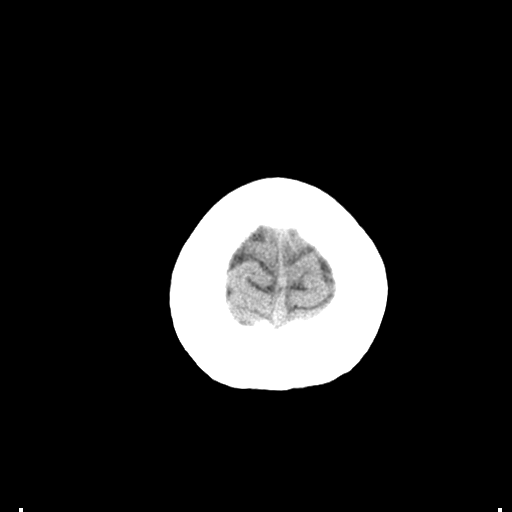
[im 25/30  bone]
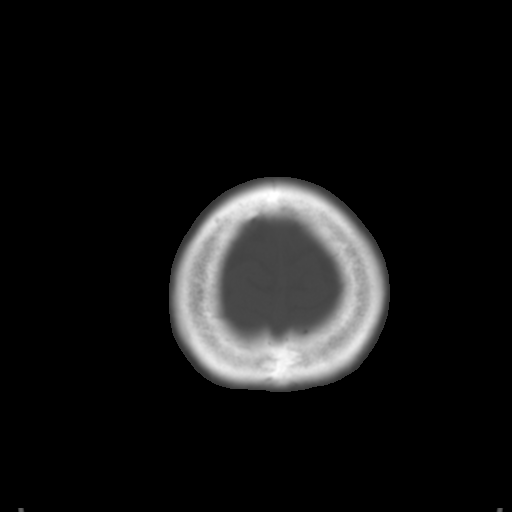
[im 28/30  brain]
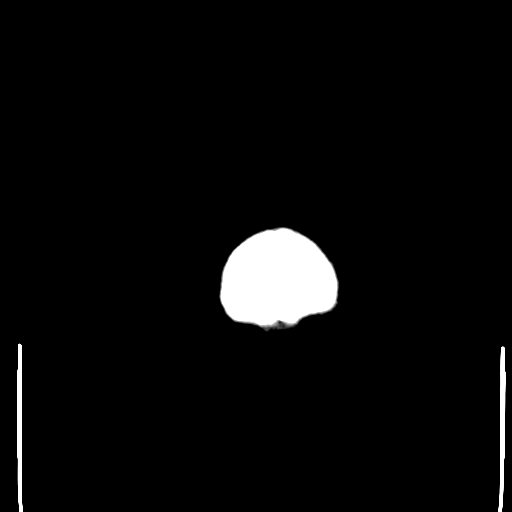

[Series 4: coronal soft tissue · coronal · 0.29mm/px · 3 of 64 slices shown]
[im 22/64  brain]
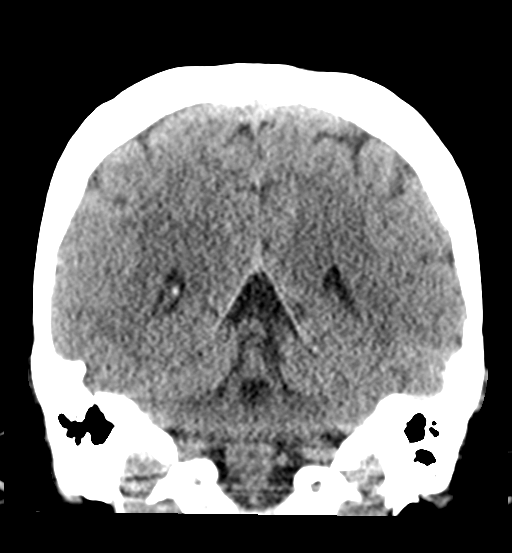
[im 29/64  brain]
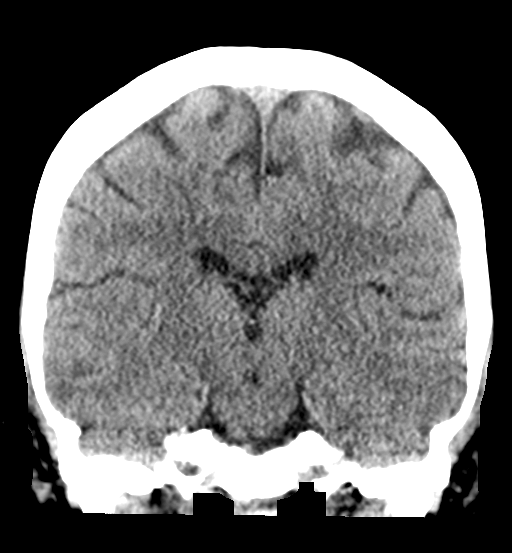
[im 36/64  brain]
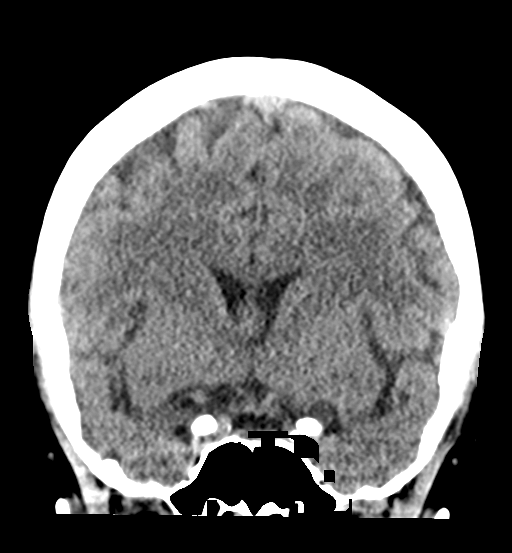

[Series 5: sagittal soft tissue · sagittal · 0.32mm/px · 3 of 51 slices shown]
[im 17/51  brain]
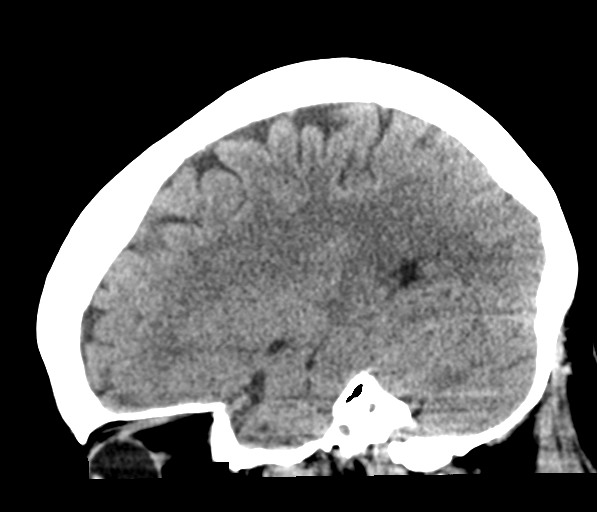
[im 26/51  brain]
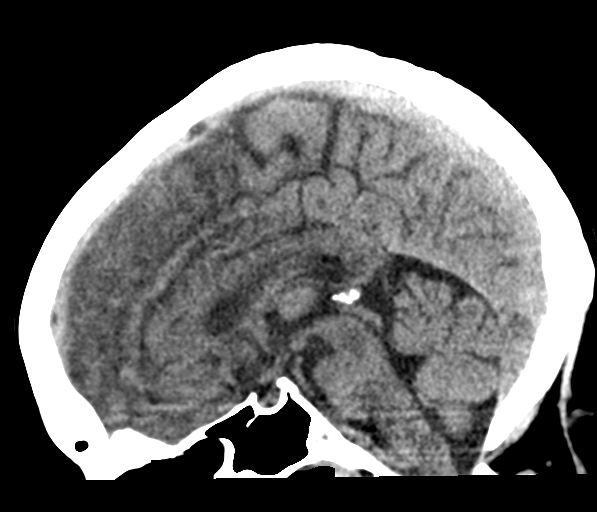
[im 34/51  brain]
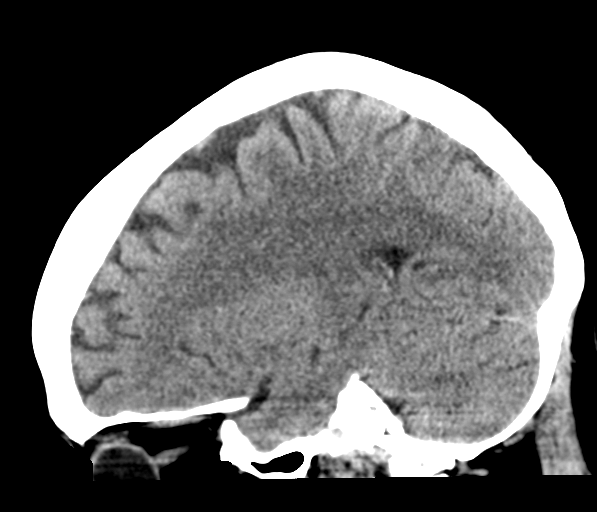

[16 of 47 positions shown; findings below may reference images not displayed]

FINDINGS: CT HEAD FINDINGS

Brain: No evidence of acute infarction, hemorrhage, hydrocephalus,
extra-axial collection or mass lesion/mass effect.

Vascular: Negative for hyperdense vessel

Skull: Negative for fracture

Sinuses/Orbits: Mild mucosal edema paranasal sinuses. Negative orbit

Other: None

CT CERVICAL SPINE FINDINGS

Alignment: Mild anterolisthesis C3-4 and C4-5. Accentuated cervical
kyphosis likely due to position.

Skull base and vertebrae: Negative for fracture

Soft tissues and spinal canal: Negative

Disc levels: No significant disc degeneration or spurring. Normal
facet joints.

Upper chest: Lung apices clear bilaterally.

Other: None
IMPRESSION: Negative CT head

Negative CT cervical spine

## 2019-06-02 MED ORDER — TRAMADOL HCL 50 MG PO TABS: 50.0000 mg | ORAL_TABLET | Freq: Four times a day (QID) | ORAL | 0 refills | Status: DC | PRN

## 2019-06-02 MED ORDER — ACETAMINOPHEN 500 MG PO TABS
1000.0000 mg | ORAL_TABLET | Freq: Once | ORAL | Status: AC
  Administered 2019-06-02: 1000 mg via ORAL
  Filled 2019-06-02: qty 2

## 2019-06-02 MED ORDER — ONDANSETRON 4 MG PO TBDP
4.0000 mg | ORAL_TABLET | Freq: Three times a day (TID) | ORAL | 0 refills | Status: DC | PRN
Start: 2019-06-02 — End: 2019-10-17

## 2019-06-02 MED ORDER — CYCLOBENZAPRINE HCL 10 MG PO TABS
10.0000 mg | ORAL_TABLET | Freq: Once | ORAL | Status: AC
  Administered 2019-06-02: 10 mg via ORAL
  Filled 2019-06-02: qty 1

## 2019-06-02 MED ORDER — ONDANSETRON 4 MG PO TBDP
4.0000 mg | ORAL_TABLET | Freq: Once | ORAL | Status: AC
  Administered 2019-06-02: 4 mg via ORAL
  Filled 2019-06-02: qty 1

## 2019-06-02 MED ORDER — CYCLOBENZAPRINE HCL 5 MG PO TABS: 5.0000 mg | ORAL_TABLET | Freq: Three times a day (TID) | ORAL | 0 refills | Status: DC | PRN

## 2019-06-02 MED ORDER — TRAMADOL HCL 50 MG PO TABS
50.0000 mg | ORAL_TABLET | Freq: Once | ORAL | Status: AC
  Administered 2019-06-02: 50 mg via ORAL
  Filled 2019-06-02: qty 1
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
## 2019-06-02 NOTE — Discharge Instructions (Signed)
Please take Tylenol and ibuprofen for mild to moderate pain.  You may use tramadol for more severe pain.  Use Flexeril as needed for muscle tightness.  Avoid screens, loud noises and bright lights.  Please rest.  If any worsening headaches, vision changes, weakness, nausea, vomiting return to the ED.

## 2019-06-02 NOTE — ED Notes (Signed)
Pt presentation discussed with Dr. Colon Branch,  Verbal given to order CT head and CT cervical spine.
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
## 2019-10-12 ENCOUNTER — Inpatient Hospital Stay
Admission: EM | Admit: 2019-10-12 | Discharge: 2019-10-17 | DRG: 641 | Disposition: A | Discharge status: Home / Self Care | Payer: No Typology Code available for payment source | Attending: Internal Medicine | Admitting: Internal Medicine

## 2019-10-12 ENCOUNTER — Inpatient Hospital Stay: Payer: No Typology Code available for payment source

## 2019-10-12 ENCOUNTER — Encounter: Payer: Self-pay | Admitting: Intensive Care

## 2019-10-12 ENCOUNTER — Emergency Department: Payer: No Typology Code available for payment source

## 2019-10-12 ENCOUNTER — Other Ambulatory Visit: Payer: Self-pay

## 2019-10-12 DIAGNOSIS — Z20822 Contact with and (suspected) exposure to covid-19: Secondary | ICD-10-CM | POA: Diagnosis present

## 2019-10-12 DIAGNOSIS — Z8249 Family history of ischemic heart disease and other diseases of the circulatory system: Secondary | ICD-10-CM | POA: Diagnosis not present

## 2019-10-12 DIAGNOSIS — E861 Hypovolemia: Secondary | ICD-10-CM | POA: Diagnosis present

## 2019-10-12 DIAGNOSIS — E871 Hypo-osmolality and hyponatremia: Principal | ICD-10-CM | POA: Diagnosis present

## 2019-10-12 DIAGNOSIS — R7989 Other specified abnormal findings of blood chemistry: Secondary | ICD-10-CM

## 2019-10-12 DIAGNOSIS — F319 Bipolar disorder, unspecified: Secondary | ICD-10-CM | POA: Diagnosis present

## 2019-10-12 DIAGNOSIS — R569 Unspecified convulsions: Secondary | ICD-10-CM

## 2019-10-12 DIAGNOSIS — Z9114 Patient's other noncompliance with medication regimen: Secondary | ICD-10-CM | POA: Diagnosis not present

## 2019-10-12 DIAGNOSIS — E876 Hypokalemia: Secondary | ICD-10-CM | POA: Diagnosis present

## 2019-10-12 DIAGNOSIS — F10229 Alcohol dependence with intoxication, unspecified: Secondary | ICD-10-CM | POA: Diagnosis present

## 2019-10-12 DIAGNOSIS — R739 Hyperglycemia, unspecified: Secondary | ICD-10-CM | POA: Diagnosis present

## 2019-10-12 DIAGNOSIS — D539 Nutritional anemia, unspecified: Secondary | ICD-10-CM | POA: Diagnosis present

## 2019-10-12 DIAGNOSIS — Z6831 Body mass index (BMI) 31.0-31.9, adult: Secondary | ICD-10-CM | POA: Diagnosis not present

## 2019-10-12 DIAGNOSIS — D72829 Elevated white blood cell count, unspecified: Secondary | ICD-10-CM | POA: Diagnosis not present

## 2019-10-12 DIAGNOSIS — K7010 Alcoholic hepatitis without ascites: Secondary | ICD-10-CM | POA: Diagnosis present

## 2019-10-12 DIAGNOSIS — F4310 Post-traumatic stress disorder, unspecified: Secondary | ICD-10-CM | POA: Diagnosis present

## 2019-10-12 DIAGNOSIS — F17210 Nicotine dependence, cigarettes, uncomplicated: Secondary | ICD-10-CM | POA: Diagnosis present

## 2019-10-12 DIAGNOSIS — Z79899 Other long term (current) drug therapy: Secondary | ICD-10-CM | POA: Diagnosis not present

## 2019-10-12 DIAGNOSIS — E878 Other disorders of electrolyte and fluid balance, not elsewhere classified: Secondary | ICD-10-CM | POA: Diagnosis present

## 2019-10-12 DIAGNOSIS — K7200 Acute and subacute hepatic failure without coma: Secondary | ICD-10-CM

## 2019-10-12 DIAGNOSIS — Z79890 Hormone replacement therapy (postmenopausal): Secondary | ICD-10-CM | POA: Diagnosis not present

## 2019-10-12 DIAGNOSIS — I10 Essential (primary) hypertension: Secondary | ICD-10-CM | POA: Diagnosis present

## 2019-10-12 DIAGNOSIS — K7290 Hepatic failure, unspecified without coma: Secondary | ICD-10-CM | POA: Diagnosis present

## 2019-10-12 DIAGNOSIS — F10239 Alcohol dependence with withdrawal, unspecified: Secondary | ICD-10-CM | POA: Diagnosis present

## 2019-10-12 HISTORY — DX: Unspecified convulsions: R56.9

## 2019-10-12 HISTORY — DX: Liver disease, unspecified: K76.9

## 2019-10-12 LAB — COMPREHENSIVE METABOLIC PANEL
ALT: 79 U/L — ABNORMAL HIGH (ref 0–44)
AST: 338 U/L — ABNORMAL HIGH (ref 15–41)
Albumin: 3.2 g/dL — ABNORMAL LOW (ref 3.5–5.0)
Alkaline Phosphatase: 223 U/L — ABNORMAL HIGH (ref 38–126)
BUN: 5 mg/dL — ABNORMAL LOW (ref 6–20)
CO2: 20 mmol/L — ABNORMAL LOW (ref 22–32)
Calcium: 7.8 mg/dL — ABNORMAL LOW (ref 8.9–10.3)
Chloride: <65 mmol/L — CL (ref 98–111)
Creatinine, Ser: 0.49 mg/dL (ref 0.44–1.00)
GFR calc Af Amer: >60 mL/min (ref >60)
GFR calc non Af Amer: >60 mL/min (ref >60)
Glucose, Bld: 203 mg/dL — ABNORMAL HIGH (ref 70–99)
Potassium: <2 mmol/L — CL (ref 3.5–5.1)
Sodium: 119 mmol/L — CL (ref 135–145)
Total Bilirubin: 12.7 mg/dL — ABNORMAL HIGH (ref 0.3–1.2)
Total Protein: 6.9 g/dL (ref 6.5–8.1)

## 2019-10-12 LAB — CBC WITH DIFFERENTIAL/PLATELET
Abs Immature Granulocytes: 0.13 10*3/uL — ABNORMAL HIGH (ref 0.00–0.07)
Basophils Absolute: 0 10*3/uL (ref 0.0–0.1)
Basophils Relative: 0 %
Eosinophils Absolute: 0 10*3/uL (ref 0.0–0.5)
Eosinophils Relative: 0 %
HCT: 27.7 % — ABNORMAL LOW (ref 36.0–46.0)
Hemoglobin: 9.8 g/dL — ABNORMAL LOW (ref 12.0–15.0)
Immature Granulocytes: 1 %
Lymphocytes Relative: 7 %
Lymphs Abs: 0.7 10*3/uL (ref 0.7–4.0)
MCH: 35.6 pg — ABNORMAL HIGH (ref 26.0–34.0)
MCHC: 35.4 g/dL (ref 30.0–36.0)
MCV: 100.7 fL — ABNORMAL HIGH (ref 80.0–100.0)
Monocytes Absolute: 0.9 10*3/uL (ref 0.1–1.0)
Monocytes Relative: 9 %
Neutro Abs: 8.9 10*3/uL — ABNORMAL HIGH (ref 1.7–7.7)
Neutrophils Relative %: 83 %
Platelets: 186 10*3/uL (ref 150–400)
RBC: 2.75 MIL/uL — ABNORMAL LOW (ref 3.87–5.11)
RDW: 20.9 % — ABNORMAL HIGH (ref 11.5–15.5)
WBC: 10.7 10*3/uL — ABNORMAL HIGH (ref 4.0–10.5)
nRBC: 0.2 % (ref 0.0–0.2)

## 2019-10-12 LAB — URINE DRUG SCREEN, QUALITATIVE (ARMC ONLY)
Amphetamines, Ur Screen: NOT DETECTED
Barbiturates, Ur Screen: NOT DETECTED
Benzodiazepine, Ur Scrn: NOT DETECTED
Cannabinoid 50 Ng, Ur ~~LOC~~: NOT DETECTED
Cocaine Metabolite,Ur ~~LOC~~: NOT DETECTED
MDMA (Ecstasy)Ur Screen: NOT DETECTED
Methadone Scn, Ur: NOT DETECTED
Opiate, Ur Screen: NOT DETECTED
Phencyclidine (PCP) Ur S: NOT DETECTED
Tricyclic, Ur Screen: NOT DETECTED

## 2019-10-12 LAB — PROTIME-INR
INR: 1.1 (ref 0.8–1.2)
INR: 1.1 (ref 0.8–1.2)
Prothrombin Time: 13.9 seconds (ref 11.4–15.2)
Prothrombin Time: 14.2 seconds (ref 11.4–15.2)

## 2019-10-12 LAB — AMMONIA: Ammonia: 57 umol/L — ABNORMAL HIGH (ref 9–35)

## 2019-10-12 LAB — TSH: TSH: 0.742 u[IU]/mL (ref 0.350–4.500)

## 2019-10-12 LAB — ACETAMINOPHEN LEVEL: Acetaminophen (Tylenol), Serum: <10 ug/mL — ABNORMAL LOW (ref 10–30)

## 2019-10-12 LAB — MAGNESIUM: Magnesium: 1.2 mg/dL — ABNORMAL LOW (ref 1.7–2.4)

## 2019-10-12 LAB — APTT
aPTT: 30 seconds (ref 24–36)
aPTT: 34 seconds (ref 24–36)

## 2019-10-12 LAB — SARS CORONAVIRUS 2 BY RT PCR (HOSPITAL ORDER, PERFORMED IN ~~LOC~~ HOSPITAL LAB): SARS Coronavirus 2: NEGATIVE

## 2019-10-12 LAB — OSMOLALITY: Osmolality: 259 mOsm/kg — ABNORMAL LOW (ref 275–295)

## 2019-10-12 LAB — NA AND K (SODIUM & POTASSIUM), RAND UR
Potassium Urine: 13 mmol/L
Sodium, Ur: 32 mmol/L

## 2019-10-12 LAB — OSMOLALITY, URINE: Osmolality, Ur: 246 mOsm/kg — ABNORMAL LOW (ref 300–900)

## 2019-10-12 IMAGING — US US ABDOMEN LIMITED
1 series · 14 of 25 positions shown · non-contrast
Comparison: None.

CLINICAL DATA: Elevated LFTs

EXAM:
ULTRASOUND ABDOMEN LIMITED RIGHT UPPER QUADRANT

[Series 1: us abdomen limited ruq · 14 of 30 slices shown]
[im 1/30]
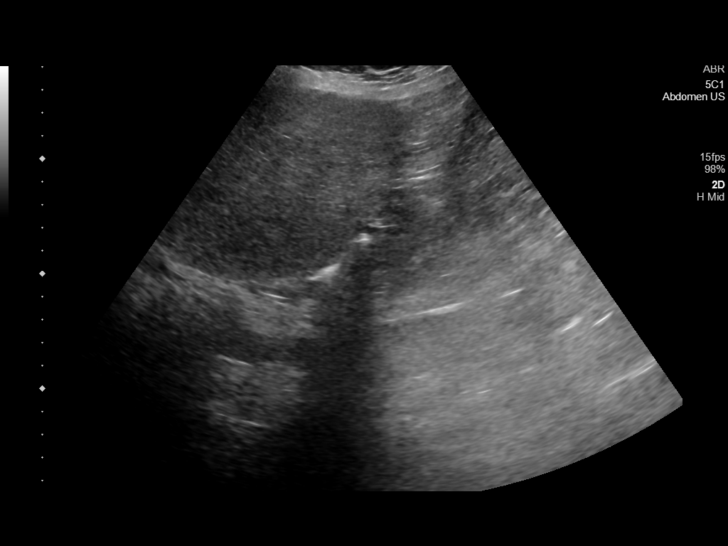
[im 3/30]
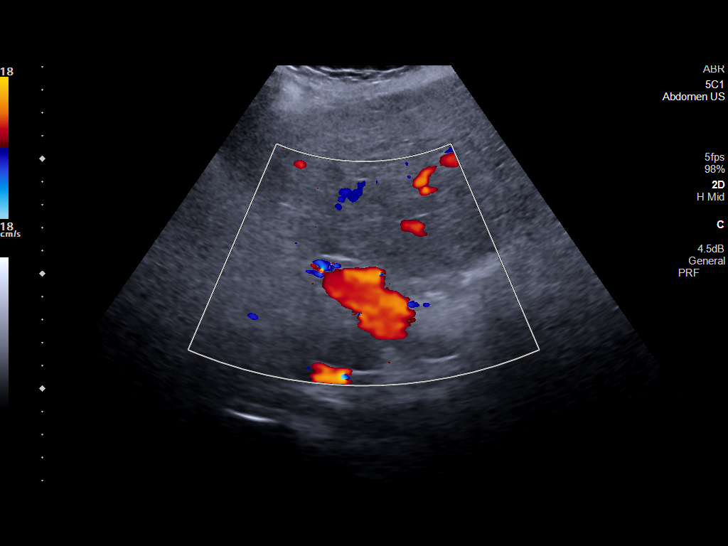
[im 5/30]
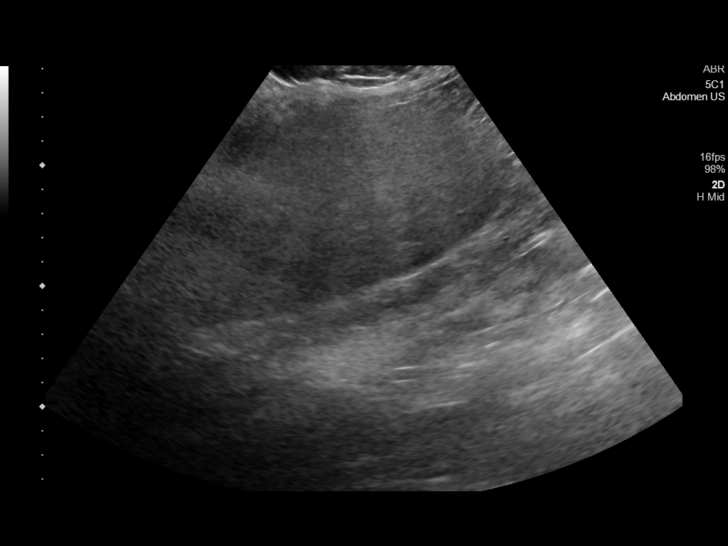
[im 8/30]
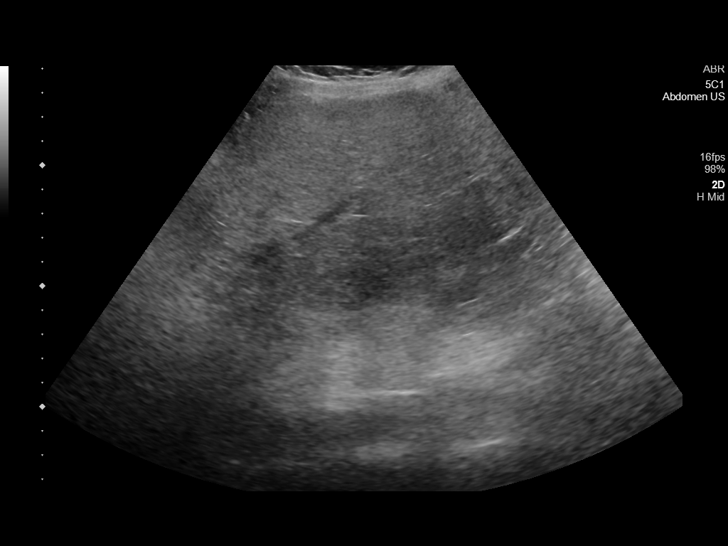
[im 10/30]
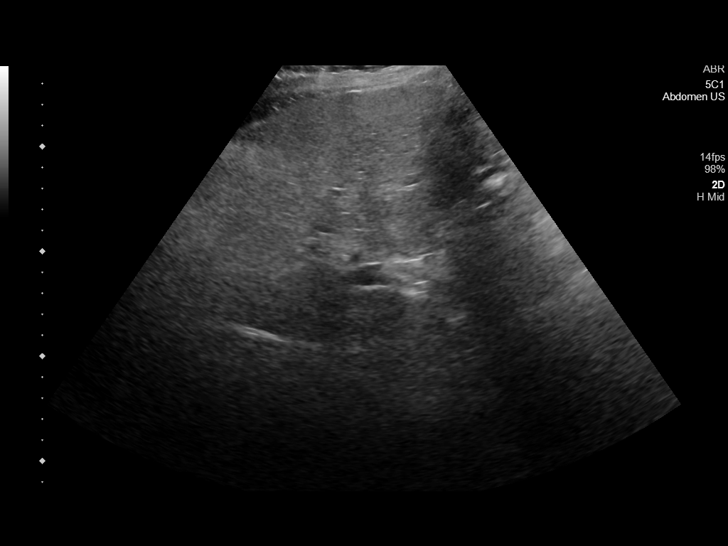
[im 11/30]
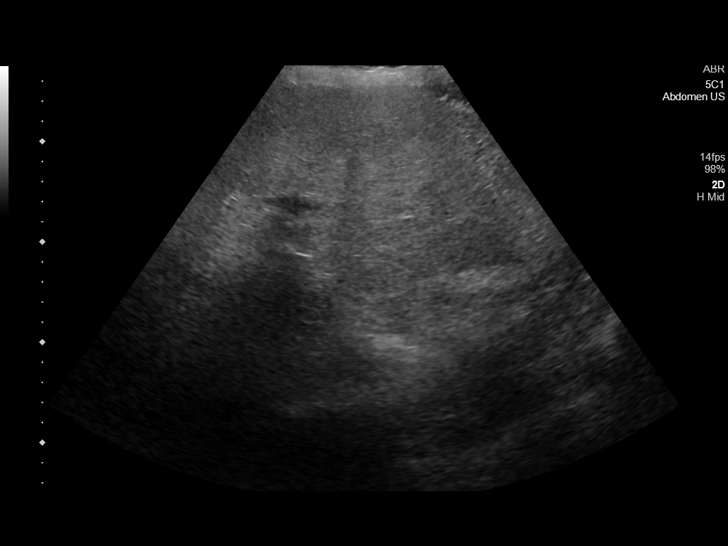
[im 14/30]
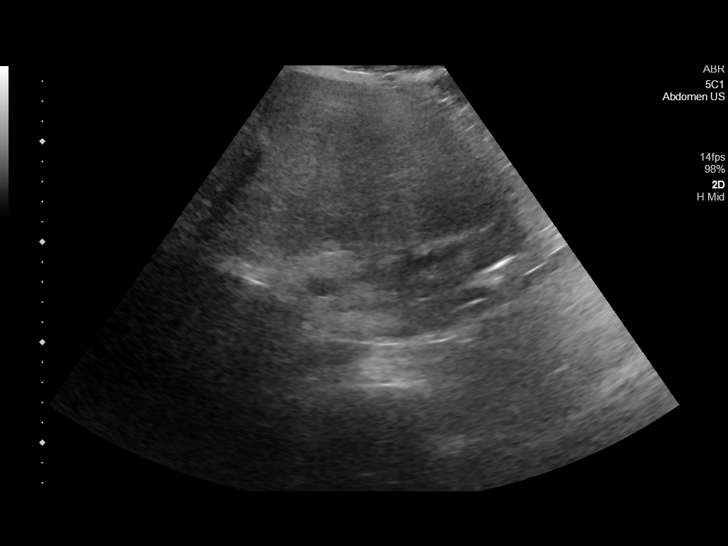
[im 16/30]
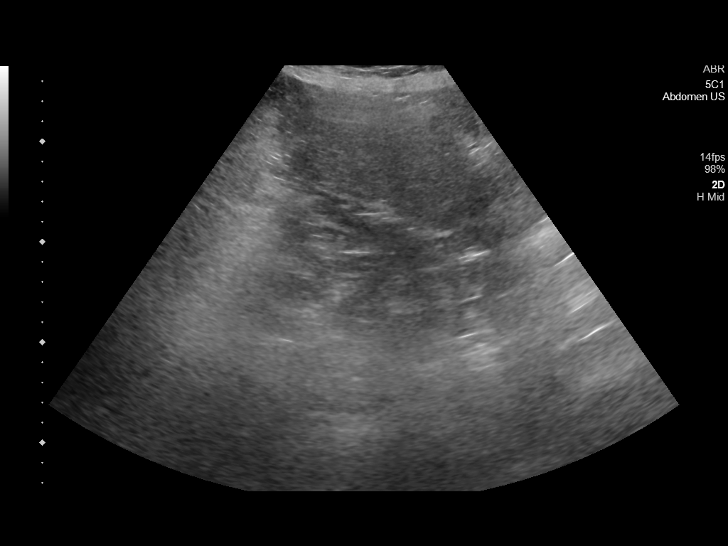
[im 19/30]
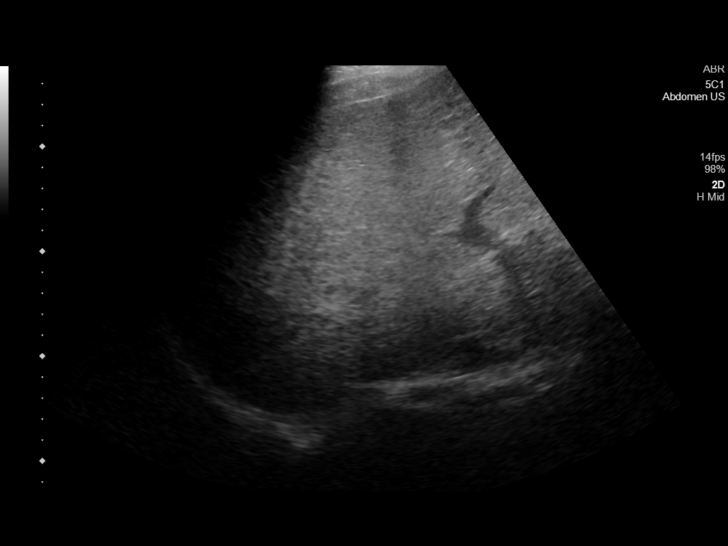
[im 20/30]
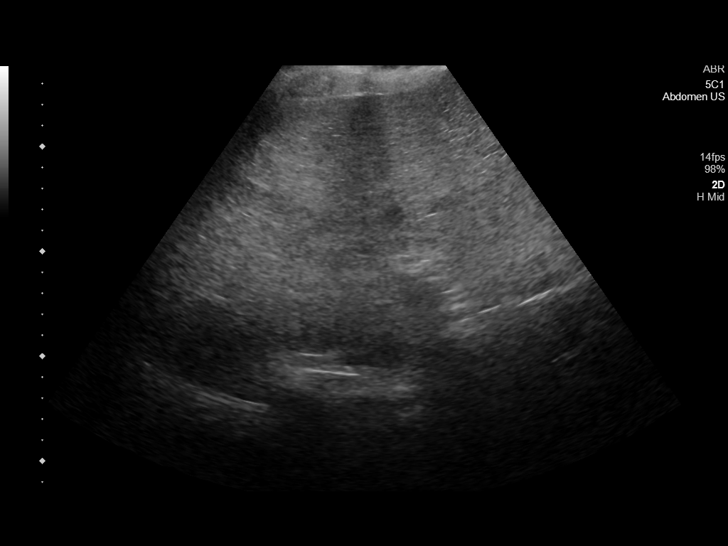
[im 22/30]
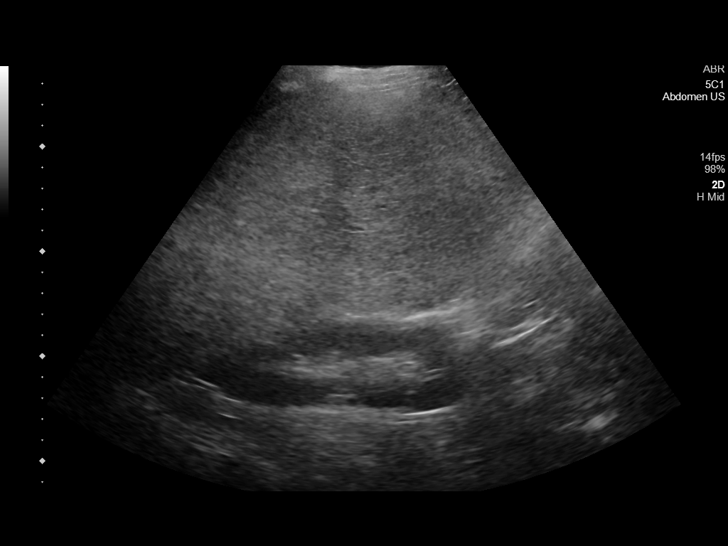
[im 25/30]
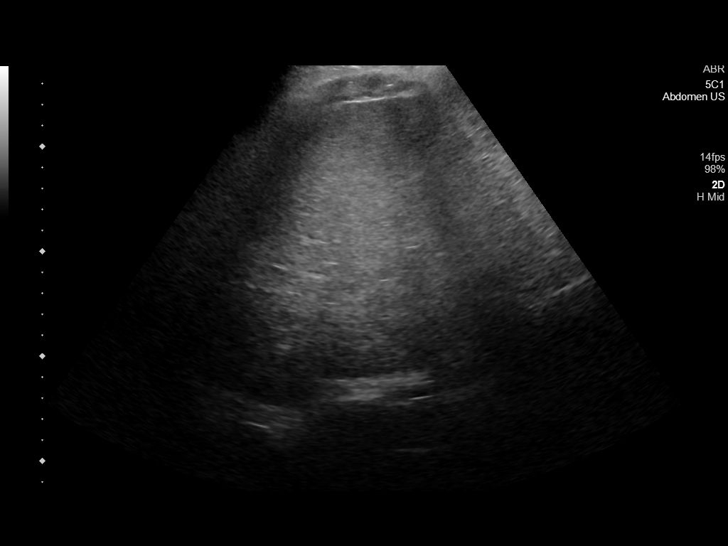
[im 27/30]
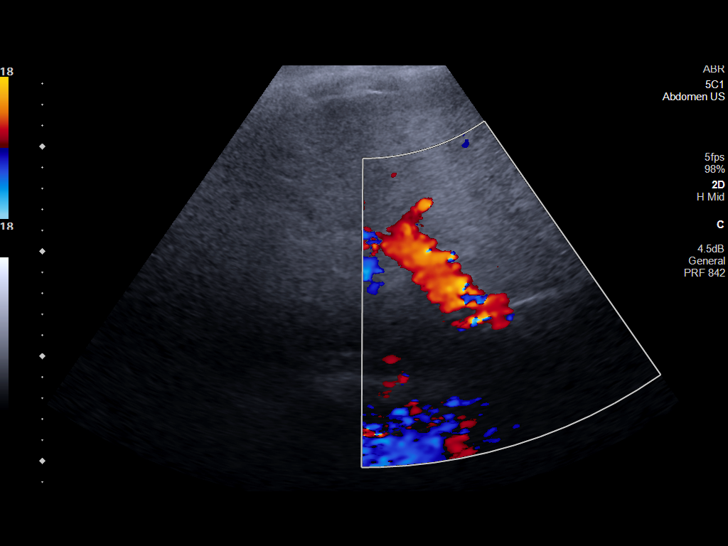
[im 30/30]
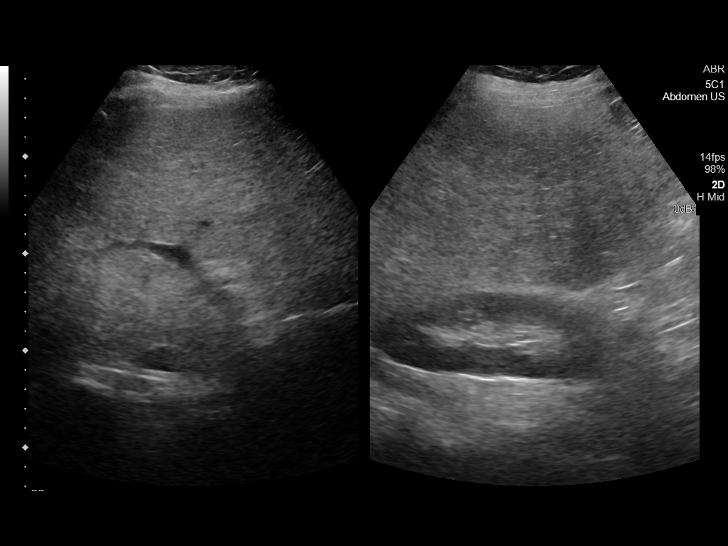

[14 of 25 positions shown; findings below may reference images not displayed]

FINDINGS: Gallbladder:

Prior cholecystectomy

Common bile duct:

Diameter: Normal caliber, 3 mm

Liver:

Heterogeneous echotexture compatible with fatty infiltration or
intrinsic liver disease. Liver appears enlarged. No focal hepatic
abnormality. Portal vein is patent on color Doppler imaging with
normal direction of blood flow towards the liver.

Other: None.
IMPRESSION: Enlarged liver with fatty infiltration or intrinsic liver disease.

## 2019-10-12 MED ORDER — MAGNESIUM SULFATE 2 GM/50ML IV SOLN
2.0000 g | Freq: Once | INTRAVENOUS | Status: AC
  Administered 2019-10-12: 2 g via INTRAVENOUS
  Filled 2019-10-12: qty 50

## 2019-10-12 MED ORDER — POTASSIUM CHLORIDE CRYS ER 20 MEQ PO TBCR
40.0000 meq | EXTENDED_RELEASE_TABLET | Freq: Once | ORAL | Status: DC
  Filled 2019-10-12: qty 2

## 2019-10-12 MED ORDER — CYCLOBENZAPRINE HCL 10 MG PO TABS: 5.0000 mg | ORAL_TABLET | Freq: Three times a day (TID) | ORAL | Status: DC | PRN

## 2019-10-12 MED ORDER — LEVETIRACETAM IN NACL 1500 MG/100ML IV SOLN
1500.0000 mg | Freq: Once | INTRAVENOUS | Status: AC
  Administered 2019-10-13: 1500 mg via INTRAVENOUS
  Filled 2019-10-12: qty 100

## 2019-10-12 MED ORDER — ONDANSETRON HCL 4 MG/2ML IJ SOLN
4.0000 mg | Freq: Four times a day (QID) | INTRAMUSCULAR | Status: DC | PRN
  Administered 2019-10-12 – 2019-10-16 (×2): 4 mg via INTRAVENOUS
  Filled 2019-10-12 (×2): qty 2

## 2019-10-12 MED ORDER — POTASSIUM CHLORIDE 20 MEQ PO PACK: 40.0000 meq | PACK | Freq: Once | ORAL | Status: DC

## 2019-10-12 MED ORDER — SODIUM CHLORIDE 0.9 % IV BOLUS
1000.0000 mL | Freq: Once | INTRAVENOUS | Status: AC
  Administered 2019-10-12: 1000 mL via INTRAVENOUS

## 2019-10-12 MED ORDER — THIAMINE HCL 100 MG/ML IJ SOLN
Freq: Once | INTRAVENOUS | Status: AC
  Filled 2019-10-12: qty 1000

## 2019-10-12 MED ORDER — POTASSIUM CHLORIDE 10 MEQ/100ML IV SOLN
10.0000 meq | INTRAVENOUS | Status: AC
  Administered 2019-10-12 (×4): 10 meq via INTRAVENOUS
  Filled 2019-10-12 (×2): qty 100

## 2019-10-12 MED ORDER — LORAZEPAM 2 MG/ML IJ SOLN: 1.0000 mg | INTRAMUSCULAR | Status: DC | PRN

## 2019-10-12 MED ORDER — POTASSIUM CHLORIDE 10 MEQ/100ML IV SOLN
10.0000 meq | INTRAVENOUS | Status: AC
  Administered 2019-10-13 (×4): 10 meq via INTRAVENOUS
  Filled 2019-10-12: qty 100

## 2019-10-12 MED ORDER — ACETAMINOPHEN 325 MG PO TABS: 650.0000 mg | ORAL_TABLET | Freq: Four times a day (QID) | ORAL | Status: DC | PRN

## 2019-10-12 MED ORDER — SERTRALINE HCL 50 MG PO TABS
50.0000 mg | ORAL_TABLET | Freq: Every day | ORAL | Status: DC
  Administered 2019-10-14 – 2019-10-17 (×4): 50 mg via ORAL
  Filled 2019-10-12 (×4): qty 1

## 2019-10-12 MED ORDER — ACETAMINOPHEN 650 MG RE SUPP: 650.0000 mg | Freq: Four times a day (QID) | RECTAL | Status: DC | PRN

## 2019-10-12 MED ORDER — LISINOPRIL 10 MG PO TABS
10.0000 mg | ORAL_TABLET | Freq: Every day | ORAL | Status: DC
  Administered 2019-10-14 – 2019-10-17 (×4): 10 mg via ORAL
  Filled 2019-10-12 (×4): qty 1

## 2019-10-12 MED ORDER — HYDRALAZINE HCL 20 MG/ML IJ SOLN: 10.0000 mg | Freq: Four times a day (QID) | INTRAMUSCULAR | Status: DC | PRN

## 2019-10-12 MED ORDER — ONDANSETRON 4 MG PO TBDP
4.0000 mg | ORAL_TABLET | Freq: Three times a day (TID) | ORAL | Status: DC | PRN
  Filled 2019-10-12: qty 1

## 2019-10-12 MED ORDER — LORAZEPAM 2 MG/ML IJ SOLN: 2.0000 mg | INTRAMUSCULAR | Status: DC | PRN

## 2019-10-12 MED ORDER — AMLODIPINE BESYLATE 5 MG PO TABS
5.0000 mg | ORAL_TABLET | Freq: Every day | ORAL | Status: DC
  Administered 2019-10-14 – 2019-10-17 (×4): 5 mg via ORAL
  Filled 2019-10-12 (×4): qty 1

## 2019-10-12 MED ORDER — NALTREXONE HCL 50 MG PO TABS: 50.0000 mg | ORAL_TABLET | Freq: Every day | ORAL | Status: DC

## 2019-10-12 MED ORDER — ENOXAPARIN SODIUM 40 MG/0.4ML ~~LOC~~ SOLN
40.0000 mg | SUBCUTANEOUS | Status: DC
  Administered 2019-10-12 – 2019-10-16 (×5): 40 mg via SUBCUTANEOUS
  Filled 2019-10-12 (×5): qty 0.4

## 2019-10-12 MED ORDER — ONDANSETRON HCL 4 MG PO TABS: 4.0000 mg | ORAL_TABLET | Freq: Four times a day (QID) | ORAL | Status: DC | PRN

## 2019-10-12 MED ORDER — POTASSIUM CHLORIDE IN NACL 40-0.9 MEQ/L-% IV SOLN
INTRAVENOUS | Status: DC
  Administered 2019-10-14: 75 mL/h via INTRAVENOUS
  Filled 2019-10-12 (×6): qty 1000

## 2019-10-12 MED ORDER — OXCARBAZEPINE 300 MG PO TABS
600.0000 mg | ORAL_TABLET | Freq: Two times a day (BID) | ORAL | Status: DC
  Administered 2019-10-13 – 2019-10-17 (×7): 600 mg via ORAL
  Filled 2019-10-12 (×9): qty 2

## 2019-10-12 MED ORDER — LACTULOSE 10 GM/15ML PO SOLN: 30.0000 g | Freq: Three times a day (TID) | ORAL | Status: DC

## 2019-10-12 MED ORDER — MELATONIN 5 MG PO TABS
5.0000 mg | ORAL_TABLET | Freq: Every day | ORAL | Status: DC
  Administered 2019-10-13 – 2019-10-16 (×4): 5 mg via ORAL
  Filled 2019-10-12 (×6): qty 1

## 2019-10-12 MED ORDER — THIAMINE HCL 100 MG/ML IJ SOLN
Freq: Once | INTRAVENOUS | Status: DC
  Filled 2019-10-12: qty 1000

## 2019-10-12 MED ORDER — LACTULOSE ENEMA
300.0000 mL | Freq: Two times a day (BID) | ORAL | Status: DC
  Administered 2019-10-13 (×2): 300 mL via RECTAL
  Filled 2019-10-12 (×5): qty 300

## 2019-10-12 MED ORDER — MAGNESIUM HYDROXIDE 400 MG/5ML PO SUSP: 30.0000 mL | Freq: Every day | ORAL | Status: DC | PRN

## 2019-10-12 NOTE — ED Notes (Signed)
Pt assisted onto bed pan per request 

## 2019-10-12 NOTE — ED Notes (Signed)
Pt assisted to bedpan and linens changed x2  Pt assisted to bedpan again

## 2019-10-12 NOTE — ED Notes (Signed)
Pt assisted on/off bedpan. Linens changed. Pt appears to be more alert/oriented, but is still slow to respond/difficulty speaking. Pt failed repeat swallow screen,

## 2019-10-12 NOTE — ED Provider Notes (Signed)
[facility] Emergency Department Provider Note   ____________________________________________   I have reviewed the triage vital signs and the nursing notes.   HISTORY  Chief Complaint Nausea vomiting   History limited by: Poor historian   HPI Ashlee Hawkins is a 39 y.o. female who presents to the emergency department today accompanied by father. Went to [place] [facility] today because of concern for nause and vomiting. The patient states that the symptoms have been ongoing for roughly 1 week. When the patient went to [facility] clinic today the patient had a seizure in the waiting room. Has history of seizures but was not able to take her medication over the past week because of the nausea and vomiting. The patient also has significant history of alcohol use and history of liver disease.   Records reviewed. Per medical record review patient has a history of seizures, alcohol abuse, alcoholic hepatitis.   Past Medical History:  Diagnosis Date  . Alcoholism (HCC)   . Bipolar 1 disorder (HCC)   . Hypertension   . Liver disease   . PTSD (post-traumatic stress disorder)   . Seizures (HCC)     There are no problems to display for this patient.   Past Surgical History:  Procedure Laterality Date  . CHOLECYSTECTOMY      Prior to Admission medications   Medication Sig Start Date End Date Taking? Authorizing Provider  amLODipine (NORVASC) 5 MG tablet Take 1 tablet (5 mg total) by mouth daily. 01/15/18 01/15/19  Loleta Rose, MD  cyclobenzaprine (FLEXERIL) 5 MG tablet Take 1-2 tablets (5-10 mg total) by mouth 3 (three) times daily as needed for muscle spasms. 06/02/19   Evon Slack, PA-C  diclofenac sodium (VOLTAREN) 1 % GEL Apply 2 g topically 4 (four) times daily as needed (pain).    [provider]  ibuprofen (ADVIL,MOTRIN) 800 MG tablet Take 800 mg by mouth 2 (two) times daily as needed (pain/ inflammation).     [provider]   lisinopril (PRINIVIL,ZESTRIL) 20 MG tablet Take 10 mg by mouth daily.    [provider]  Melatonin 3 MG TABS Take 3 mg by mouth at bedtime.    [provider]  naltrexone (DEPADE) 50 MG tablet Take 50 mg by mouth daily.    [provider]  ondansetron (ZOFRAN ODT) 4 MG disintegrating tablet Take 1 tablet (4 mg total) by mouth every 8 (eight) hours as needed for nausea or vomiting. 06/02/19   Evon Slack, PA-C  oxcarbazepine (TRILEPTAL) 600 MG tablet Take 600 mg by mouth 2 (two) times daily.    [provider]  sertraline (ZOLOFT) 100 MG tablet Take 50 mg by mouth daily.    [provider]  traMADol (ULTRAM) 50 MG tablet Take 1 tablet (50 mg total) by mouth every 6 (six) hours as needed. 06/02/19 06/01/20  Evon Slack, PA-C    Allergies Patient has no known allergies.  Family History  Problem Relation Age of Onset  . Heart failure Mother     Social History Social History   Tobacco Use  . Smoking status: Current Every Day Smoker    Packs/day: 1.00    Types: Cigarettes  . Smokeless tobacco: Never Used  Vaping Use  . Vaping Use: Never used  Substance Use Topics  . Alcohol use: Yes    Alcohol/week: 36.0 standard drinks    Types: 36 Shots of liquor per week  . Drug use: Not Currently    Types:  Cocaine, Marijuana    Review of Systems Constitutional: No fever/chills Eyes: No visual changes. ENT: No sore throat. Cardiovascular: Denies chest pain. Respiratory: Denies shortness of breath. Gastrointestinal: Positive for nausea, vomiting, diarrhea.  Genitourinary: Negative for dysuria. Musculoskeletal: Negative for back pain. Skin: Negative for rash. Neurological: Positive for seizure.  ____________________________________________   PHYSICAL EXAM:  VITAL SIGNS: ED Triage Vitals  Enc Vitals Group     BP 10/12/19 1514 (!) 168/99     Pulse Rate 10/12/19 1514 (!) 53     Resp 10/12/19 1514 (!) 22     Temp 10/12/19 1514 98  F (36.7 C)     Temp Source 10/12/19 1514 Oral     SpO2 10/12/19 1514 97 %     Weight 10/12/19 1516 185 lb (83.9 kg)     Height 10/12/19 1516 5\' 4"  (1.626 m)     Head Circumference --      Peak Flow --      Pain Score 10/12/19 1515 8   Constitutional: Awake and alert, slightly confused. Eyes: Scleral icterus.  ENT      Head: Normocephalic and atraumatic.      Nose: No congestion/rhinnorhea.      Mouth/Throat: Mucous membranes are moist.      Neck: No stridor. Hematological/Lymphatic/Immunilogical: No cervical lymphadenopathy. Cardiovascular: Normal rate, regular rhythm.  No murmurs, rubs, or gallops.  Respiratory: Normal respiratory effort without tachypnea nor retractions. Breath sounds are clear and equal bilaterally. No wheezes/rales/rhonchi. Gastrointestinal: Soft and tender to palpation. Genitourinary: Deferred Musculoskeletal: Normal range of motion in all extremities. No lower extremity edema. Neurologic:  Normal speech and language. No gross focal neurologic deficits are appreciated.  Skin: Slight jaundice.  Psychiatric: Mood and affect are normal. Speech and behavior are normal. Patient exhibits appropriate insight and judgment.  ____________________________________________    LABS (pertinent positives/negatives)  CBC wbc 10.7, hgb 9.8, plt 186 CMP na 119, K <2, cl <65, glu 203, cr 0.49, ast 338, alt 79, t bili 12.7 ____________________________________________   EKG  I, 12/12/19, attending physician, personally viewed and interpreted this EKG  EKG Time: 1507 Rate: 110 Rhythm: sinus rhythm with ventricular bigeminy Axis: normal Intervals: qtc 573 QRS: narrow ST changes: no st elevation Impression: abnormal ekg   ____________________________________________    RADIOLOGY  RUQ Phineas Semen Enlarged liver with fatty  infiltration.  ____________________________________________   PROCEDURES  Procedures  ____________________________________________   INITIAL IMPRESSION / ASSESSMENT AND PLAN / ED COURSE  Pertinent labs & imaging results that were available during my care of the patient were reviewed by me and considered in my medical decision making (see chart for details).   Patient presented to the emergency department after suffering a seizure at [facility] clinic where she presented for nausea, vomiting and diarrhea. Patient has significant alcohol use history. Work up is concerning for alcoholic hepatitis. Discussed this with the patient. Will plan on admission to the hospital.   ____________________________________________   FINAL CLINICAL IMPRESSION(S) / ED DIAGNOSES  Final diagnoses:  Elevated LFTs  Alcoholic hepatitis, unspecified whether ascites present     Note: This dictation was prepared with Dragon dictation. Any transcriptional errors that result from this process are unintentional     [facility], MD 10/12/19 2026

## 2019-10-12 NOTE — H&P (Addendum)
[place]   PATIENT NAME: Ashlee Hawkins    MR#:  [id number]  DATE OF BIRTH:  [birth year]  DATE OF ADMISSION:  10/12/2019  PRIMARY CARE PHYSICIAN: [facility] [place]   REQUESTING/REFERRING PHYSICIAN: Nance Pear, MD  CHIEF COMPLAINT:  Recurrent nausea and vomiting  HISTORY OF PRESENT ILLNESS:  Ashlee Hawkins  is a 39 y.o. Caucasian female with a known history of bipolar 1 disorder, alcoholism, hypertension, chronic liver disease, posttraumatic stress disorder and seizures, who presented to the emergency room with acute onset of recurrent nausea and vomiting for which she was seen today at [facility] clinic and had a seizure episode they are in a couple of episodes in the ER.  She has been having dizziness as well as lightheadedness and epigastric and right upper quadrant abdominal discomfort with occasional loose bowel movements.  She denied any cough or wheezing or dyspnea.  She has been fairly somnolent but easily arousable.  During my interview she was very slow to respond to questions.  Last alcoholic drink was yesterday per her report.  Her father was with her in the room but states that he usually is out for work. No dysuria, oliguria or hematuria or flank pain.  Upon presentation to the emergency room, blood pressure was 168/99 with a heart rate of 53 and respiratory to 22.  Labs revealed hyponatremia 119 hypochloremia of less than 65 and hypokalemia less than 2 with CO2 of 20 and glucose of 2 3, calcium 7.8 with albumin of 3.2.  AST was 338 and ALT 79 with alk phos of 223.  Magnesium was low at 1.2 ammonia levels high at 57 and total bili was significantly high at 12.7.  CBC showed mild cytosis of 10.7 and anemia with hemoglobin 9.8 hematocrit of 27.7 compared to 16.2 and 46.6 on 08/05/2018 and to 12.9/38.2 on 01/14/2018.  Tylenol levels less than 10, PT 13.9 INR 1.1 with PTT of 30. Twelve-lead EKG showed sinus tachycardia with rate 110 with PVCs in a pattern of bigeminy  and nonspecific T wave abnormalities.  The patient was given 2 g of IV magnesium sulfate, 10 mEq of IV potassium chloride and 40 mEq p.o., 1 L bolus of IV normal saline and a banana bag 150 mL/h.  She will be admitted to medical monitored bed for further evaluation and management. PAST MEDICAL HISTORY:   Past Medical History:  Diagnosis Date  . Alcoholism ([place])   . Bipolar 1 disorder ([place])   . Hypertension   . Liver disease   . PTSD (post-traumatic stress disorder)   . Seizures ([place])     PAST SURGICAL HISTORY:   Past Surgical History:  Procedure Laterality Date  . CHOLECYSTECTOMY      SOCIAL HISTORY:   Social History   Tobacco Use  . Smoking status: Current Every Day Smoker    Packs/day: 1.00    Types: Cigarettes  . Smokeless tobacco: Never Used  Substance Use Topics  . Alcohol use: Yes    Alcohol/week: 36.0 standard drinks    Types: 36 Shots of liquor per week    FAMILY HISTORY:   Family History  Problem Relation Age of Onset  . Heart failure Mother     DRUG ALLERGIES:  No Known Allergies  REVIEW OF SYSTEMS:   ROS As per history of present illness. All pertinent systems were reviewed above. Constitutional, HEENT, cardiovascular, respiratory, GI, GU, musculoskeletal, neuro, psychiatric, endocrine, integumentary and hematologic systems were reviewed and are otherwise negative/unremarkable  except for positive findings mentioned above in the HPI.   MEDICATIONS AT HOME:   Prior to Admission medications   Medication Sig Start Date End Date Taking? Authorizing Provider  amLODipine (NORVASC) 5 MG tablet Take 1 tablet (5 mg total) by mouth daily. 01/15/18 01/15/19  Hinda Kehr, MD  cyclobenzaprine (FLEXERIL) 5 MG tablet Take 1-2 tablets (5-10 mg total) by mouth 3 (three) times daily as needed for muscle spasms. 06/02/19   Duanne Guess, PA-C  diclofenac sodium (VOLTAREN) 1 % GEL Apply 2 g topically 4 (four) times daily as needed (pain).    [provider]  ibuprofen (ADVIL,MOTRIN) 800 MG tablet Take 800 mg by mouth 2 (two) times daily as needed (pain/ inflammation).     [provider]  lisinopril (PRINIVIL,ZESTRIL) 20 MG tablet Take 10 mg by mouth daily.    [provider]  Melatonin 3 MG TABS Take 3 mg by mouth at bedtime.    [provider]  naltrexone (DEPADE) 50 MG tablet Take 50 mg by mouth daily.    [provider]  ondansetron (ZOFRAN ODT) 4 MG disintegrating tablet Take 1 tablet (4 mg total) by mouth every 8 (eight) hours as needed for nausea or vomiting. 06/02/19   Duanne Guess, PA-C  oxcarbazepine (TRILEPTAL) 600 MG tablet Take 600 mg by mouth 2 (two) times daily.    [provider]  sertraline (ZOLOFT) 100 MG tablet Take 50 mg by mouth daily.    [provider]  traMADol (ULTRAM) 50 MG tablet Take 1 tablet (50 mg total) by mouth every 6 (six) hours as needed. 06/02/19 06/01/20  Duanne Guess, PA-C      VITAL SIGNS:  Blood pressure 128/86, pulse 98, temperature 98 F (36.7 C), temperature source Oral, resp. rate (!) 23, height 5' 4" (1.626 m), weight 83.9 kg, SpO2 98 %.  PHYSICAL EXAMINATION:  Physical Exam  GENERAL:  39 y.o.-year-old Caucasian female patient lying in the bed with no acute distress.  She was fairly somnolent but arousable, and slow to answer questions. EYES: Pupils equal, round, reactive to light and accommodation.  Positive scleral icterus. Extraocular muscles intact.  HEENT: Head atraumatic, normocephalic. Oropharynx and nasopharynx clear.  NECK:  Supple, no jugular venous distention. No thyroid enlargement, no tenderness.  LUNGS: Normal breath sounds bilaterally, no wheezing, rales,rhonchi or crepitation. No use of accessory muscles of respiration.  CARDIOVASCULAR: Regular rate and rhythm, S1, S2 normal. No murmurs, rubs, or gallops.  ABDOMEN: Soft, nondistended, nontender. Bowel sounds present. No organomegaly or mass.  EXTREMITIES: 1+ right lower  extremity pitting edema with no cyanosis, or clubbing.  NEUROLOGIC: Cranial nerves II through XII are intact except for mildly slurred speech. Muscle strength 5/5 in all extremities. Sensation intact. Gait not checked.  PSYCHIATRIC: The patient is somnolent but arousable and very slow to answer questions.  She had no good eye contact. SKIN: No obvious rash, lesion, or ulcer.   LABORATORY PANEL:   CBC Recent Labs  Lab 10/12/19 1521  WBC 10.7*  HGB 9.8*  HCT 27.7*  PLT 186   ------------------------------------------------------------------------------------------------------------------  Chemistries  Recent Labs  Lab 10/12/19 1521 10/12/19 1630  NA 119*  --   K <2.0*  --   CL <65*  --   CO2 20*  --   GLUCOSE 203*  --   BUN 5*  --   CREATININE 0.49  --   CALCIUM 7.8*  --   MG  --  1.2*  AST  338*  --   ALT 79*  --   ALKPHOS 223*  --   BILITOT 12.7*  --    ------------------------------------------------------------------------------------------------------------------  Cardiac Enzymes No results for input(s): TROPONINI in the last 168 hours. ------------------------------------------------------------------------------------------------------------------  RADIOLOGY:  No results found.    IMPRESSION AND PLAN:   1.  Symptomatic hyponatremia, likely hypovolemic secondary to recurrent nausea and vomiting.  It could be related to alcoholic [place]. -The patient will be admitted to a medically monitored bed. -She will be hydrated with IV normal saline.  We will follow serial BMPs. -We will obtain hyponatremia work-up.  2.  Acute hepatitis likely alcoholic with associated hepatic encephalopathy. -We will follow LFTs with hydration. -Should be placed on a banana bag daily. -We will obtain acute viral hepatitis panel. -We will place her on scheduled lactulose and follow daily ammonia level. -A GI consultation will be obtained. -I notified Dr. Bonna Gains about the  patient.  3.  Recurrent seizures.  This could be related to alcohol withdrawal. -We will obtain an alcohol level. -We will place on seizure precautions and as needed IV Ativan. -We will continue her Trileptal. -Obtain a brain MRI without contrast specially given her slurred speech to rule out CVA.  4.  Hypokalemia and hypomagnesemia. -Potassium and magnesium will be aggressively replaced and followed.  5.  Anemia. -We will obtain anemia work-up including stool Hemoccult. -CBC will be monitored.  6.  Hypertension. -We will continue her Norvasc and Zestril.  7.  Depression. -We will continue her antidepressants.  8.  DVT prophylaxis. -Subcutaneous Lovenox.  All the records are reviewed and case discussed with ED provider. The plan of care was discussed in details with the patient (and family). I answered all questions. The patient agreed to proceed with the above mentioned plan. Further management will depend upon hospital course.   CODE STATUS: Full code  Status is: Inpatient  Remains inpatient appropriate because:Persistent severe electrolyte disturbances, Altered mental status, Ongoing diagnostic testing needed not appropriate for outpatient work up, Unsafe d/c plan, IV treatments appropriate due to intensity of illness or inability to take PO and Inpatient level of care appropriate due to severity of illness   Dispo: The patient is from: Home              Anticipated d/c is to: Home              Anticipated d/c date is: 3 days              Patient currently is not medically stable to d/c.   TOTAL TIME TAKING CARE OF THIS PATIENT: 55 minutes.    Christel Mormon M.D on 10/12/2019 at 7:39 PM  [facility]   From 7 PM-7 AM, contact night-coverage [email or web address]  CC: Primary care physician; [place]   Note: This dictation was prepared with Dragon dictation along with smaller phrase technology. Any transcriptional typo errors that result from this  process are unintentional.

## 2019-10-12 NOTE — ED Notes (Addendum)
Pt reports emesis x1 week, low appetite. History of alcohol abuse and liver failure.   Seizure medication, but unable to take due to emesis. Seizure today at [place]   yellow sclera noted  Pt with slow response, mild confusion

## 2019-10-12 NOTE — ED Notes (Signed)
Patient had a seizure.  Remains in recliner chair.  Extension to arms and legs with jerking motion seen to extremities.  Seizure duration approximately 5 seconds.  Brief postictal states.  Respirations regular and non labored.

## 2019-10-12 NOTE — ED Triage Notes (Signed)
Dad reports emesis, dizziness and diarrhea X1 week. Was waiting to be seen at [facility] clinic when patient had seizure. HX seizures, renal failure, alcoholic. Per patient and father last alcoholic drink was last night. Patient has yellowing of eyes in triage. Patient having trouble speaking in triage and dad reports that started after seizure next door. Baseline ambulatory with no problems.

## 2019-10-12 NOTE — Progress Notes (Addendum)
Cross Cover Note Notified by RN patient failed swallow eval.  Consulted pharmacy to advise of best seizure controlling med IV to replace he trileptal.  Oral lactulose changed to enemas.  orqal potassium discontinued and additional 4 K runs ordered. Clarified patient is to get total 4 gm IV mag.  Antihypertensive management hydralazine ordered prn

## 2019-10-13 ENCOUNTER — Encounter: Payer: Self-pay | Admitting: Family Medicine

## 2019-10-13 ENCOUNTER — Inpatient Hospital Stay: Payer: No Typology Code available for payment source

## 2019-10-13 DIAGNOSIS — E876 Hypokalemia: Secondary | ICD-10-CM

## 2019-10-13 DIAGNOSIS — K7010 Alcoholic hepatitis without ascites: Secondary | ICD-10-CM

## 2019-10-13 DIAGNOSIS — E871 Hypo-osmolality and hyponatremia: Principal | ICD-10-CM

## 2019-10-13 DIAGNOSIS — D539 Nutritional anemia, unspecified: Secondary | ICD-10-CM

## 2019-10-13 LAB — CBC WITH DIFFERENTIAL/PLATELET
Abs Immature Granulocytes: 0.05 10*3/uL (ref 0.00–0.07)
Basophils Absolute: 0 10*3/uL (ref 0.0–0.1)
Basophils Relative: 0 %
Eosinophils Absolute: 0.1 10*3/uL (ref 0.0–0.5)
Eosinophils Relative: 1 %
HCT: 21.1 % — ABNORMAL LOW (ref 36.0–46.0)
Hemoglobin: 7 g/dL — ABNORMAL LOW (ref 12.0–15.0)
Immature Granulocytes: 1 %
Lymphocytes Relative: 16 %
Lymphs Abs: 1.2 10*3/uL (ref 0.7–4.0)
MCH: 35.5 pg — ABNORMAL HIGH (ref 26.0–34.0)
MCHC: 33.2 g/dL (ref 30.0–36.0)
MCV: 107.1 fL — ABNORMAL HIGH (ref 80.0–100.0)
Monocytes Absolute: 0.7 10*3/uL (ref 0.1–1.0)
Monocytes Relative: 9 %
Neutro Abs: 5.6 10*3/uL (ref 1.7–7.7)
Neutrophils Relative %: 73 %
Platelets: 188 10*3/uL (ref 150–400)
RBC: 1.97 MIL/uL — ABNORMAL LOW (ref 3.87–5.11)
RDW: 21.7 % — ABNORMAL HIGH (ref 11.5–15.5)
WBC: 7.7 10*3/uL (ref 4.0–10.5)
nRBC: 0 % (ref 0.0–0.2)

## 2019-10-13 LAB — CBC
HCT: 24.8 % — ABNORMAL LOW (ref 36.0–46.0)
Hemoglobin: 8.7 g/dL — ABNORMAL LOW (ref 12.0–15.0)
MCH: 35.8 pg — ABNORMAL HIGH (ref 26.0–34.0)
MCHC: 35.1 g/dL (ref 30.0–36.0)
MCV: 102.1 fL — ABNORMAL HIGH (ref 80.0–100.0)
Platelets: 179 10*3/uL (ref 150–400)
RBC: 2.43 MIL/uL — ABNORMAL LOW (ref 3.87–5.11)
RDW: 21.2 % — ABNORMAL HIGH (ref 11.5–15.5)
WBC: 8.2 10*3/uL (ref 4.0–10.5)
nRBC: 0 % (ref 0.0–0.2)

## 2019-10-13 LAB — COMPREHENSIVE METABOLIC PANEL
ALT: 71 U/L — ABNORMAL HIGH (ref 0–44)
ALT: 60 U/L — ABNORMAL HIGH (ref 0–44)
ALT: 62 U/L — ABNORMAL HIGH (ref 0–44)
AST: 268 U/L — ABNORMAL HIGH (ref 15–41)
AST: 286 U/L — ABNORMAL HIGH (ref 15–41)
AST: 303 U/L — ABNORMAL HIGH (ref 15–41)
Albumin: 2.9 g/dL — ABNORMAL LOW (ref 3.5–5.0)
Albumin: 2.6 g/dL — ABNORMAL LOW (ref 3.5–5.0)
Albumin: 2.4 g/dL — ABNORMAL LOW (ref 3.5–5.0)
Alkaline Phosphatase: 181 U/L — ABNORMAL HIGH (ref 38–126)
Alkaline Phosphatase: 161 U/L — ABNORMAL HIGH (ref 38–126)
Alkaline Phosphatase: 152 U/L — ABNORMAL HIGH (ref 38–126)
Anion gap: 20 — ABNORMAL HIGH (ref 5–15)
Anion gap: 14 (ref 5–15)
Anion gap: 15 (ref 5–15)
BUN: <5 mg/dL — ABNORMAL LOW (ref 6–20)
BUN: <5 mg/dL — ABNORMAL LOW (ref 6–20)
BUN: 5 mg/dL — ABNORMAL LOW (ref 6–20)
CO2: 33 mmol/L — ABNORMAL HIGH (ref 22–32)
CO2: 35 mmol/L — ABNORMAL HIGH (ref 22–32)
CO2: 32 mmol/L (ref 22–32)
Calcium: 7.6 mg/dL — ABNORMAL LOW (ref 8.9–10.3)
Calcium: 7 mg/dL — ABNORMAL LOW (ref 8.9–10.3)
Calcium: 7 mg/dL — ABNORMAL LOW (ref 8.9–10.3)
Chloride: 77 mmol/L — ABNORMAL LOW (ref 98–111)
Chloride: 84 mmol/L — ABNORMAL LOW (ref 98–111)
Chloride: 86 mmol/L — ABNORMAL LOW (ref 98–111)
Creatinine, Ser: 0.42 mg/dL — ABNORMAL LOW (ref 0.44–1.00)
Creatinine, Ser: <0.3 mg/dL — ABNORMAL LOW (ref 0.44–1.00)
Creatinine, Ser: 0.32 mg/dL — ABNORMAL LOW (ref 0.44–1.00)
GFR calc Af Amer: >60 mL/min (ref >60)
GFR calc Af Amer: >60 mL/min (ref >60)
GFR calc non Af Amer: >60 mL/min (ref >60)
GFR calc non Af Amer: >60 mL/min (ref >60)
Glucose, Bld: 120 mg/dL — ABNORMAL HIGH (ref 70–99)
Glucose, Bld: 117 mg/dL — ABNORMAL HIGH (ref 70–99)
Glucose, Bld: 152 mg/dL — ABNORMAL HIGH (ref 70–99)
Potassium: <2 mmol/L — CL (ref 3.5–5.1)
Potassium: 2.1 mmol/L — CL (ref 3.5–5.1)
Potassium: 2.7 mmol/L — CL (ref 3.5–5.1)
Sodium: 130 mmol/L — ABNORMAL LOW (ref 135–145)
Sodium: 133 mmol/L — ABNORMAL LOW (ref 135–145)
Sodium: 133 mmol/L — ABNORMAL LOW (ref 135–145)
Total Bilirubin: 16.7 mg/dL — ABNORMAL HIGH (ref 0.3–1.2)
Total Bilirubin: 15.5 mg/dL — ABNORMAL HIGH (ref 0.3–1.2)
Total Bilirubin: 14.6 mg/dL — ABNORMAL HIGH (ref 0.3–1.2)
Total Protein: 6.3 g/dL — ABNORMAL LOW (ref 6.5–8.1)
Total Protein: 5.4 g/dL — ABNORMAL LOW (ref 6.5–8.1)
Total Protein: 5.4 g/dL — ABNORMAL LOW (ref 6.5–8.1)

## 2019-10-13 LAB — VITAMIN B12: Vitamin B-12: 598 pg/mL (ref 180–914)

## 2019-10-13 LAB — GLUCOSE, CAPILLARY
Glucose-Capillary: 123 mg/dL — ABNORMAL HIGH (ref 70–99)
Glucose-Capillary: 123 mg/dL — ABNORMAL HIGH (ref 70–99)
Glucose-Capillary: 159 mg/dL — ABNORMAL HIGH (ref 70–99)

## 2019-10-13 LAB — RETICULOCYTES
Immature Retic Fract: 3.9 % (ref 2.3–15.9)
RBC.: 2.43 MIL/uL — ABNORMAL LOW (ref 3.87–5.11)
Retic Count, Absolute: 25 10*3/uL (ref 19.0–186.0)
Retic Ct Pct: 1 % (ref 0.4–3.1)

## 2019-10-13 LAB — IRON AND TIBC
Iron: 197 ug/dL — ABNORMAL HIGH (ref 28–170)
Saturation Ratios: 100 % — ABNORMAL HIGH (ref 10.4–31.8)
TIBC: 197 ug/dL — ABNORMAL LOW (ref 250–450)
UIBC: 0 ug/dL

## 2019-10-13 LAB — FERRITIN: Ferritin: 4306 ng/mL — ABNORMAL HIGH (ref 11–307)

## 2019-10-13 LAB — LACTATE DEHYDROGENASE: LDH: 619 U/L — ABNORMAL HIGH (ref 98–192)

## 2019-10-13 LAB — PHOSPHORUS
Phosphorus: UNDETERMINED mg/dL (ref 2.5–4.6)
Phosphorus: UNDETERMINED mg/dL (ref 2.5–4.6)

## 2019-10-13 LAB — FOLATE: Folate: 15.4 ng/mL (ref >5.9)

## 2019-10-13 LAB — HIV ANTIBODY (ROUTINE TESTING W REFLEX): HIV Screen 4th Generation wRfx: NONREACTIVE

## 2019-10-13 LAB — HEPATITIS PANEL, ACUTE
HCV Ab: NONREACTIVE
Hep A IgM: NONREACTIVE
Hep B C IgM: NONREACTIVE
Hepatitis B Surface Ag: NONREACTIVE

## 2019-10-13 LAB — AMMONIA: Ammonia: 53 umol/L — ABNORMAL HIGH (ref 9–35)

## 2019-10-13 LAB — MAGNESIUM: Magnesium: 2.2 mg/dL (ref 1.7–2.4)

## 2019-10-13 IMAGING — MR MR HEAD W/O CM
11 series · 42 of 48 positions shown · non-contrast
Comparison: Prior CT from [DATE].

CLINICAL DATA: Initial evaluation for acute seizure.

EXAM:
MRI HEAD WITHOUT CONTRAST
TECHNIQUE: Multiplanar, multiecho pulse sequences of the brain and surrounding
structures were obtained without intravenous contrast.

[Series 5: ax dwi_tracew · axial · 3.0mm · 0.60mm/px · z∈[-126,+28]mm · 4 of 48 slices shown]
[im 1/48]
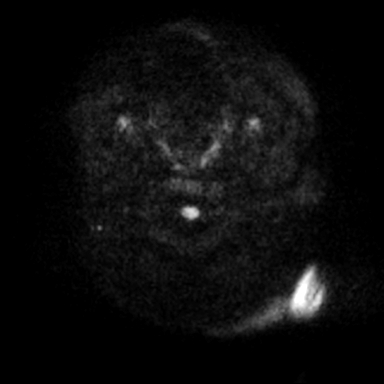
[im 16/48]
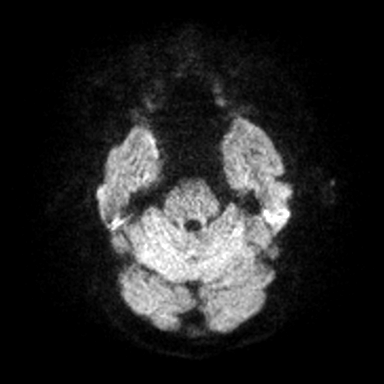
[im 32/48]
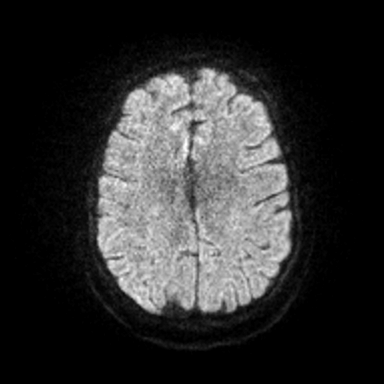
[im 48/48]
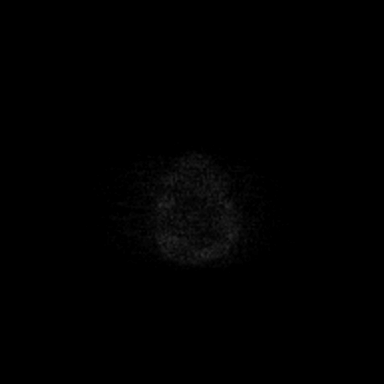

[Series 6: ax dwi_adc · axial · 3.0mm · 0.60mm/px · z∈[-126,+22]mm · 4 of 46 slices shown]
[im 1/46]
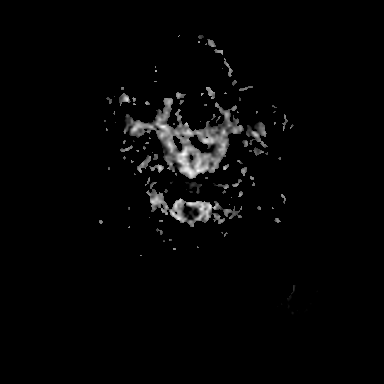
[im 16/46]
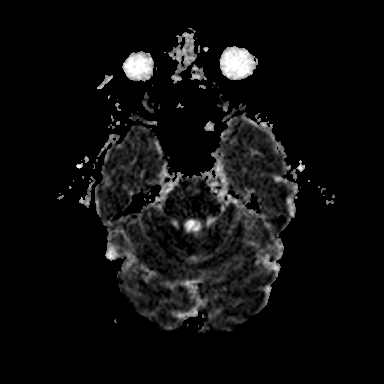
[im 31/46]
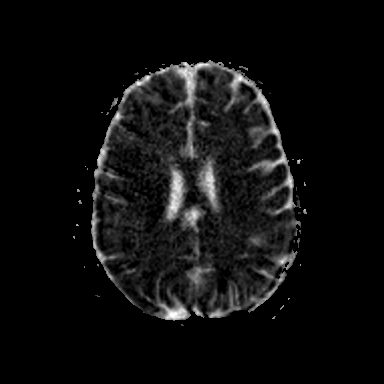
[im 46/46]
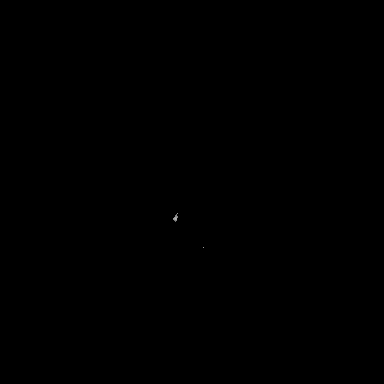

[Series 7: cor dwi_tracew · coronal · 5.0mm · 0.60mm/px · 3 of 40 slices shown]
[im 1/40]
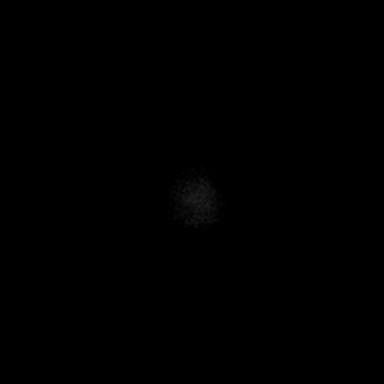
[im 20/40]
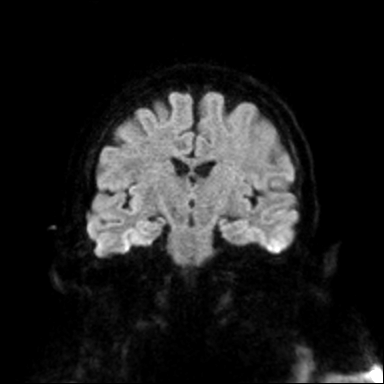
[im 40/40]
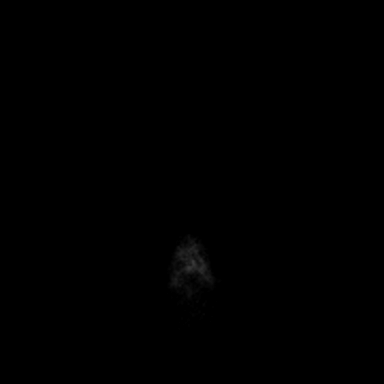

[Series 8: cor dwi_adc · coronal · 5.0mm · 0.60mm/px · 3 of 35 slices shown]
[im 1/35]
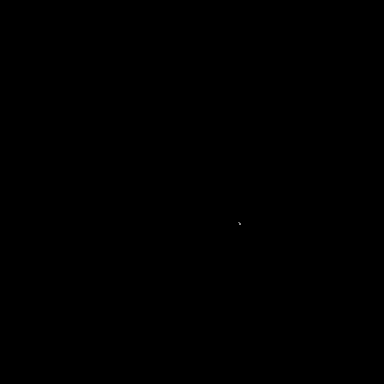
[im 18/35]
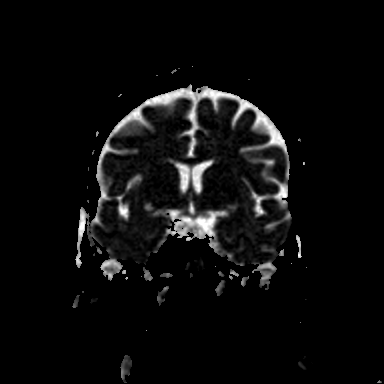
[im 35/35]
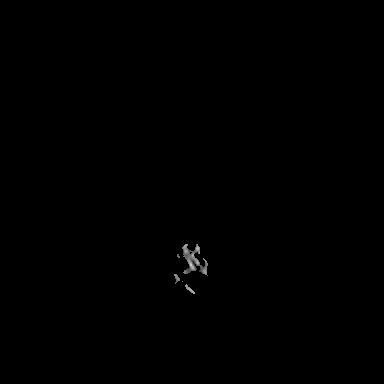

[Series 9: T1 · sagittal · 5.0mm · 0.62mm/px · 2 of 23 slices shown (1 of 2)]
[im 1/23]
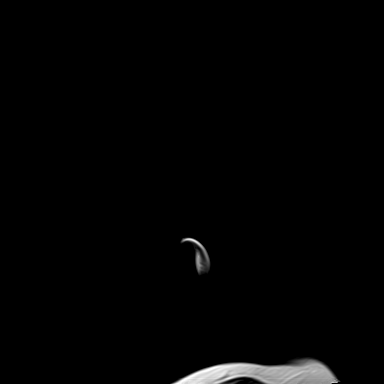
[im 23/23]
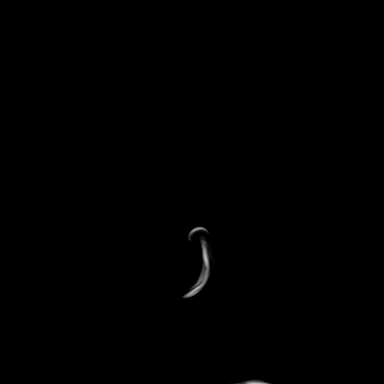

[Series 12: pha_images · axial · 3.0mm · 0.90mm/px · z∈[-138,+38]mm · 5 of 60 slices shown]
[im 1/60]
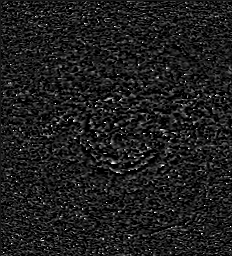
[im 15/60]
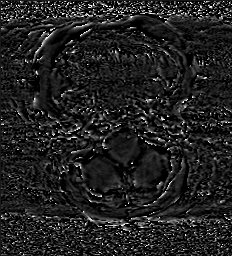
[im 30/60]
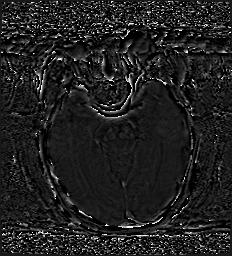
[im 45/60]
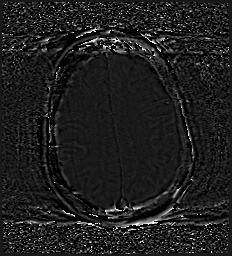
[im 60/60]
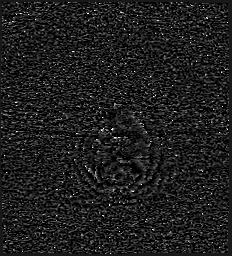

[Series 13: swi_images · axial · 3.0mm · 0.90mm/px · z∈[-138,+38]mm · 5 of 60 slices shown]
[im 1/60]
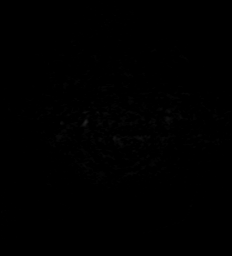
[im 15/60]
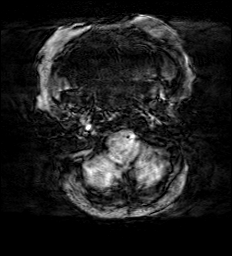
[im 30/60]
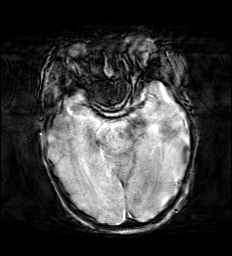
[im 45/60]
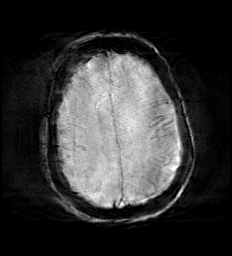
[im 60/60]
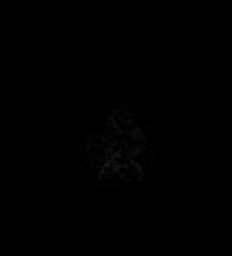

[Series 15: FLAIR · axial · 3.0mm · 0.53mm/px · z∈[-130,+31]mm · 4 of 55 slices shown]
[im 1/55]
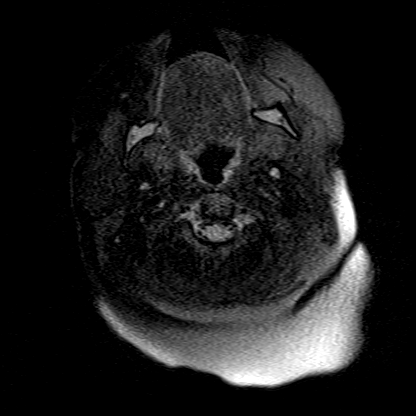
[im 19/55]
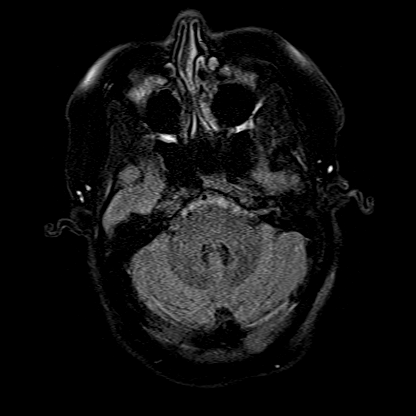
[im 37/55]
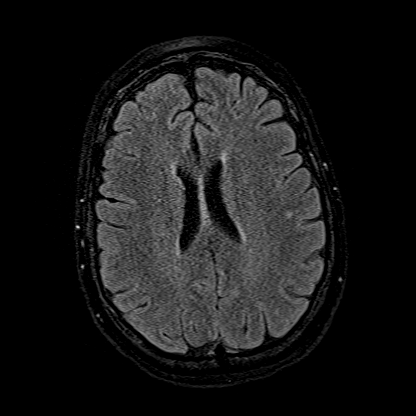
[im 55/55]
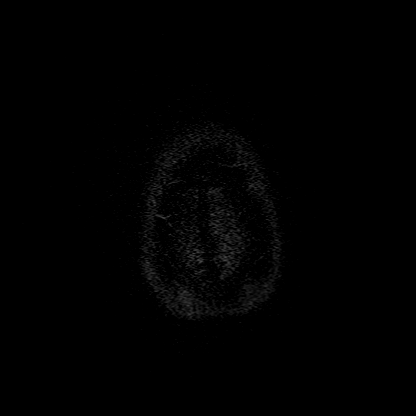

[Series 16: T2 · axial · 5.0mm · 0.53mm/px · z∈[-130,+31]mm · 2 of 28 slices shown (1 of 2)]
[im 1/28]
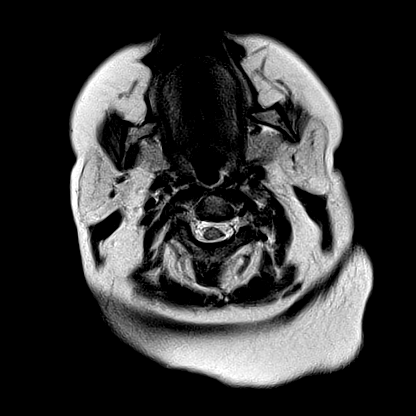
[im 28/28]
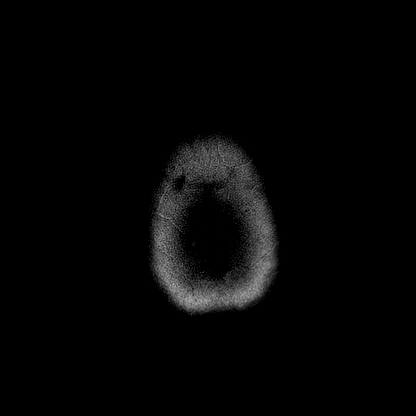

[Series 17: T1 · axial · 1.0mm · 0.98mm/px · z∈[-135,+39]mm · 8 of 176 slices shown (2 of 2)]
[im 1/176]
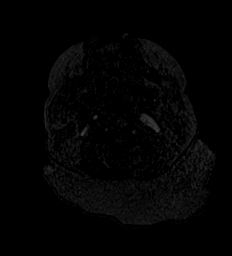
[im 27/176]
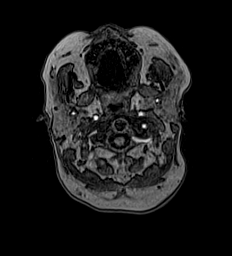
[im 54/176]
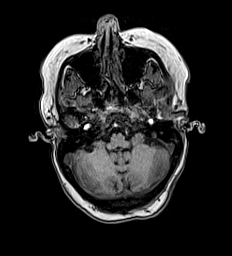
[im 81/176]
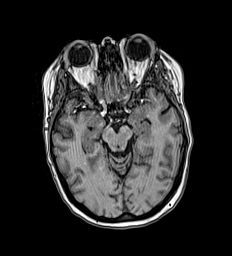
[im 95/176]
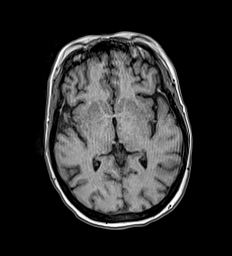
[im 122/176]
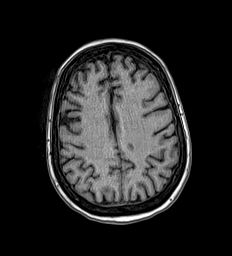
[im 149/176]
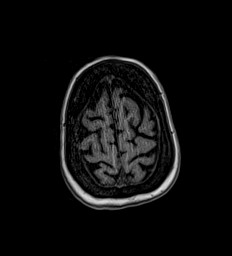
[im 176/176]
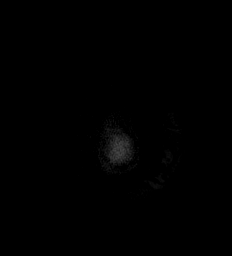

[Series 18: T2 · coronal · 5.0mm · 0.57mm/px · 2 of 29 slices shown (2 of 2)]
[im 1/29]
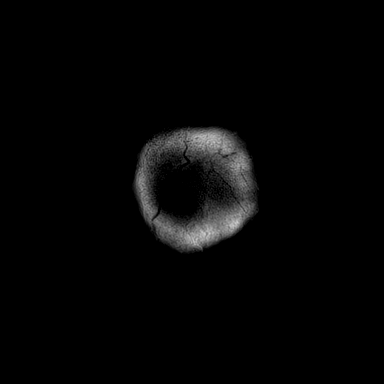
[im 29/29]
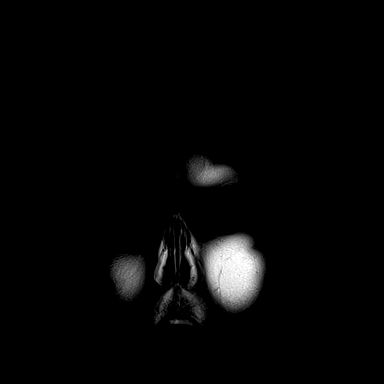

[42 of 48 positions shown; findings below may reference images not displayed]

FINDINGS: Brain: Cerebral volume within normal limits. Few scattered foci of
subcentimeter T2/FLAIR hyperintensity noted within the
supratentorial cerebral white matter, nonspecific, but felt to be
within normal limits for age, and of doubtful significance in the
acute setting.

No abnormal foci of restricted diffusion to suggest acute or
subacute ischemia or changes related to seizure. Gray-white matter
differentiation maintained. No encephalomalacia to suggest chronic
cortical infarction. No definite foci of susceptibility artifact to
suggest acute or chronic intracranial hemorrhage.

No mass lesion, midline shift or mass effect. No hydrocephalus or
extra-axial fluid collection. Pituitary gland suprasellar region
normal. Midline structures intact.

Vascular: Major intracranial vascular flow voids are maintained.

Skull and upper cervical spine: Craniocervical junction within
normal limits. Bone marrow signal intensity normal. No scalp soft
tissue abnormality.

Sinuses/Orbits: Globes and orbital soft tissues demonstrate no acute
finding. Mild scattered mucosal thickening noted within the
ethmoidal air cells and maxillary sinuses. Small right greater than
left mastoid effusions noted, of doubtful significance. Visualized
nasopharynx within normal limits.

Other: None.
IMPRESSION: Normal brain MRI for age. No acute intracranial abnormality
identified.

## 2019-10-13 MED ORDER — POTASSIUM CHLORIDE 10 MEQ/100ML IV SOLN
INTRAVENOUS | Status: AC
  Filled 2019-10-13: qty 100

## 2019-10-13 MED ORDER — RIFAXIMIN 550 MG PO TABS
550.0000 mg | ORAL_TABLET | Freq: Two times a day (BID) | ORAL | Status: DC
  Administered 2019-10-13 – 2019-10-17 (×8): 550 mg via ORAL
  Filled 2019-10-13 (×9): qty 1

## 2019-10-13 MED ORDER — LEVETIRACETAM IN NACL 500 MG/100ML IV SOLN
500.0000 mg | Freq: Two times a day (BID) | INTRAVENOUS | Status: DC
  Administered 2019-10-14 – 2019-10-15 (×3): 500 mg via INTRAVENOUS
  Filled 2019-10-13 (×6): qty 100

## 2019-10-13 MED ORDER — CALCIUM GLUCONATE-NACL 1-0.675 GM/50ML-% IV SOLN
1.0000 g | Freq: Once | INTRAVENOUS | Status: AC
  Administered 2019-10-13: 1000 mg via INTRAVENOUS
  Filled 2019-10-13: qty 50

## 2019-10-13 MED ORDER — POTASSIUM CHLORIDE CRYS ER 20 MEQ PO TBCR
40.0000 meq | EXTENDED_RELEASE_TABLET | Freq: Two times a day (BID) | ORAL | Status: DC
  Administered 2019-10-13 – 2019-10-15 (×3): 40 meq via ORAL
  Filled 2019-10-13 (×4): qty 2

## 2019-10-13 MED ORDER — LACTULOSE 10 GM/15ML PO SOLN: 30.0000 g | Freq: Two times a day (BID) | ORAL | Status: DC | PRN

## 2019-10-13 MED ORDER — LACTULOSE 10 GM/15ML PO SOLN
30.0000 g | Freq: Three times a day (TID) | ORAL | Status: DC
  Administered 2019-10-13 – 2019-10-14 (×2): 30 g via ORAL
  Filled 2019-10-13 (×2): qty 60

## 2019-10-13 MED ORDER — HYDRALAZINE HCL 20 MG/ML IJ SOLN: 10.0000 mg | Freq: Four times a day (QID) | INTRAMUSCULAR | Status: DC | PRN

## 2019-10-13 MED ORDER — LEVETIRACETAM IN NACL 1000 MG/100ML IV SOLN
1000.0000 mg | Freq: Two times a day (BID) | INTRAVENOUS | Status: DC
  Filled 2019-10-13: qty 100

## 2019-10-13 MED ORDER — POTASSIUM CHLORIDE 10 MEQ/100ML IV SOLN
10.0000 meq | INTRAVENOUS | Status: AC
  Administered 2019-10-13 (×3): 10 meq via INTRAVENOUS
  Filled 2019-10-13 (×2): qty 100

## 2019-10-13 NOTE — ED Notes (Signed)
EEG at bedside.

## 2019-10-13 NOTE — ED Notes (Signed)
Pt to MRI

## 2019-10-13 NOTE — Progress Notes (Signed)
PROGRESS NOTE    [name]  LPF:[id number] DOB: 1980/11/06 DOA: 10/12/2019 PCP: [facility] [place]   Brief Narrative:  HPI per Dr. Eugenie Norrie on 10/12/19 Ashlee Hawkins  is a 39 y.o. Caucasian female with a known history of bipolar 1 disorder, alcoholism, hypertension, chronic liver disease, posttraumatic stress disorder and seizures, who presented to the emergency room with acute onset of recurrent nausea and vomiting for which she was seen today at [facility] clinic and had a seizure episode they are in a couple of episodes in the ER.  She has been having dizziness as well as lightheadedness and epigastric and right upper quadrant abdominal discomfort with occasional loose bowel movements.  She denied any cough or wheezing or dyspnea.  She has been fairly somnolent but easily arousable.  During my interview she was very slow to respond to questions.  Last alcoholic drink was yesterday per her report.  Her father was with her in the room but states that he usually is out for work. No dysuria, oliguria or hematuria or flank pain.  Upon presentation to the emergency room, blood pressure was 168/99 with a heart rate of 53 and respiratory to 22.  Labs revealed hyponatremia 119 hypochloremia of less than 65 and hypokalemia less than 2 with CO2 of 20 and glucose of 2 3, calcium 7.8 with albumin of 3.2.  AST was 338 and ALT 79 with alk phos of 223.  Magnesium was low at 1.2 ammonia levels high at 57 and total bili was significantly high at 12.7.  CBC showed mild cytosis of 10.7 and anemia with hemoglobin 9.8 hematocrit of 27.7 compared to 16.2 and 46.6 on 08/05/2018 and to 12.9/38.2 on 01/14/2018.  Tylenol levels less than 10, PT 13.9 INR 1.1 with PTT of 30. Twelve-lead EKG showed sinus tachycardia with rate 110 with PVCs in a pattern of bigeminy and nonspecific T wave abnormalities.  The patient was given 2 g of IV magnesium sulfate, 10 mEq of IV potassium chloride and 40 mEq p.o., 1 L bolus of IV  normal saline and a banana bag 150 mL/h.  She will be admitted to medical monitored bed for further evaluation and management.  **Interim History   Assessment & Plan:   Active Problems:   Alcoholic hepatitis   Hyponatremia  Symptomatic hyponatremia and hypochloremia, likely hypovolemic secondary to recurrent nausea and vomiting.  It could be related to alcoholic Potomania. -The patient will be admitted to a medically monitored bed. -Patient sodium on admission was 119 and chloride level was less than 65 -She will be hydrated with IV normal saline.  We will follow serial BMPs. -Sodium is now improved from 119 and trended up to 130 and repeat this morning was 133 -Repeat CMP this evening is pending; continue to follow serial BMPs and CMP -TSH was 0.742 -Serum osmolality was 259  Acute Hepatitis likely alcoholic with associated hepatic encephalopathy and abnormal LFTs. Hyperbilirubinemia -We will follow LFTs with hydration. -AST went from 338 is now down to 86 next-ALT went from 79 is now down to 60 -Should be placed on a banana bag daily. -We will obtain acute viral hepatitis panel which was negative -We will place her on scheduled lactulose and follow daily ammonia level.  As it was elevated at 57 and repeat was 53 this morning and repeat ammonia level in a.m. -[name] moderate discriminant function score was 10.9 and did not meet criteria for prednisolone -GI recommends starting gabapentin 100 g 2-3 times daily for  alcohol withdrawals and starting rifaximin 550 mg p.o. twice daily along with lactulose 30 g 2 times daily -[name] T bili went from 12.7 and trended up to 16.7 and now trended back down slightly to 15.5 -GI consultation will be obtained is still pending. -Dr. Sidney Ace notified Dr. Bonna Gains about the patient and Dr. Marius Ditch to see the patient later today and appreciate further recommendations  Recurrent seizures.  This could be related to alcohol withdrawal. -We will  obtain an alcohol level. -We will place on seizure precautions and as needed IV Ativan. -We will continue her Trileptal however she was unable to safely take p.o. so he was started on a Keppra load given IV Keppra 1500 mg x 2 and Neurology will recommend continuing IV 500 mg every 12 hours; OF note last time the patient refill Trileptal was back in 2020 November -Obtain a brain MRI without contrast specially given her slurred speech to rule out CVA; the MRI of the brain showed "Normal brain MRI for age. No acute intracranial abnormality identified." -Neurology was consulted and Dr. Cheral Marker and because the patient was unable to take p.o. yesterday and failed her swallow evaluation he recommends continuing Keppra 500 mg twice daily and when she is able to take p.o. restarting the Trileptal and overlapping the Keppra by 2 days and then stopping the [place] -EEG was done and was suggestive of mild diffuse encephalopathy nonspecific to etiology but no seizures or epileptiform discharges seen throughout this recording -Dr. Mendel Ryder also recommends discontinuing ongoing blood total daily dose of her Ultram as it can lower the seizure threshold -Continue with inpatient seizure precautions and CIWA protocol  Hypomagnesemia -[name] magnesium level on admission was 1.2 and after repletion is improved to 2.2  -continue monitor and replete as necessary -Repeat Mag level in a.m.  Hypokalemia -Significant and very severe -On admission patient's potassium was less than 2.0 and on repeat was less than 2.0.  Repeat this morning showed a potassium of 2.1 -Continue potassium repletion; currently getting normal saline with 40 mg of KCl and reduce the rate from 125 MLS per hour to 75 MLS per hour and replete with p.o. potassium chloride 40 mg twice daily as well as IV KCl 40 mg twice daily -Continue to monitor and replete as necessary -Repeat CMP in a.m.  Macrocytic Anemia likely in the setting of  alcoholism -[name] hemoglobin/hematocrit went from 9.8/27.7 is now 8.7/24.8 -Check anemia panel and showed an iron level 197, U IBC of 0, TIBC 197, saturation ratios 100%, ferritin level 4306, folate level 15.4, and vitamin B12 level of 598 -We will obtain anemia work-up including stool Hemoccult. -Continue to monitor for signs and symptoms of bleeding; currently no overt bleeding noted Repeat CBC in a.m.  Essential Hypertension. -Continue with amlodipine 5 mg p.o. daily as well as lisinopril 10 mg p.o. daily X-continue IV hydralazine 10 mg every 6 as needed for systolic blood pressure greater than 458 or diastolic blood pressure greater than 110 -Last BP was 116/61 -Continue to Monitor BP per protocol   Depression. -We will continue her antidepressants if able to take p.o. with sertraline 50 mg p.o. daily  Hypocalcemia -Paitent's Ca2+ went from 7.6 -> 7.0; Corrected for Albumin was 8.1 -Will give 1 gram of Calcium Gluconate  Obesity -Estimated body mass index is 31.76 kg/m as calculated from the following:   Height as of this encounter: 5' 4"  (1.626 m).   Weight as of this encounter: 83.9 kg. -Weight Loss and Dietary Counseling when  more awake  DVT prophylaxis: Enoxaparin 40 mg sq q24h Code Status: FULL CODE  Family Communication: No family present at bedside  Disposition Plan: Pending further clinical improvement back to baseline and correction of electrolytes  Status is: Inpatient  Remains inpatient appropriate because:Persistent severe electrolyte disturbances, Altered mental status, Unsafe d/c plan, IV treatments appropriate due to intensity of illness or inability to take PO and Inpatient level of care appropriate due to severity of illness   Dispo: The patient is from: Home              Anticipated d/c is to: TBD              Anticipated d/c date is: 3 days              Patient currently is not medically stable to d/c.  Consultants:    Neurology  Gastroenterology   Procedures:  EEG   Antimicrobials:  Anti-infectives (From admission, onward)   Start     Dose/Rate Route Frequency Ordered Stop   10/13/19 2200  rifaximin (XIFAXAN) tablet 550 mg        550 mg Oral 2 times daily 10/13/19 1549          Subjective: Seen and examined and was a little confused still but answering questions.  She denies any nausea but does have some pain on the right side in the RLQ.  Denies any lightheadedness or dizziness.  No other concerns or complaints at this time.  Objective: Vitals:   10/13/19 1400 10/13/19 1430 10/13/19 1530 10/13/19 1600  BP: (!) 159/83 (!) 143/80 139/80 116/61  Pulse: (!) 110 100 98 94  Resp:   14 18  Temp:      TempSrc:      SpO2: 97% 96% 97% 98%  Weight:      Height:        Intake/Output Summary (Last 24 hours) at 10/13/2019 1727 Last data filed at 10/13/2019 1636 Gross per 24 hour  Intake 400 ml  Output 1450 ml  Net -1050 ml   Filed Weights   10/12/19 1516  Weight: 83.9 kg   Examination: Physical Exam:  Constitutional: WN/WD obese Caucasian female in NAD and appears calm but a little uncomfortable Eyes: PERRL, lids and conjunctivae normal, sclerae icteric  ENMT: External Ears, Nose appear normal. Grossly normal hearing. Neck: Appears normal, supple, no cervical masses, normal ROM, no appreciable thyromegaly; no JVD Respiratory: Diminished to auscultation bilaterally with coarse breath sounds, no wheezing, rales, rhonchi or crackles. Normal respiratory effort and patient is not tachypenic. No accessory muscle use.  Cardiovascular: RRR, no murmurs / rubs / gallops. S1 and S2 auscultated. Trace extremity edema.  Abdomen: Soft, Tender, Distended 2/2 to body habitus. Bowel sounds positive x4.  GU: Deferred. Musculoskeletal: No clubbing / cyanosis of digits/nails. No joint deformity upper and lower extremities.  Skin: No rashes, lesions, ulcers on a limited skin evaluation. No induration; Warm  and dry.  Neurologic: CN 2-12 grossly intact with no focal deficits. Romberg sign cerebellar reflexes not assessed.  Psychiatric: Slightly impaired judgment and insight. Alert and awake but is still a little confused. Normal mood and appropriate affect.   Data Reviewed: I have personally reviewed following labs and imaging studies  CBC: Recent Labs  Lab 10/12/19 1521 10/13/19 0327  WBC 10.7* 8.2  NEUTROABS 8.9*  --   HGB 9.8* 8.7*  HCT 27.7* 24.8*  MCV 100.7* 102.1*  PLT 186 193   Basic Metabolic Panel: Recent Labs  Lab 10/12/19 1521 10/12/19 1630 10/13/19 0327 10/13/19 1155  NA 119*  --  130* 133*  K <2.0*  --  <2.0* 2.1*  CL <65*  --  77* 84*  CO2 20*  --  33* 35*  GLUCOSE 203*  --  120* 117*  BUN 5*  --  <5* <5*  CREATININE 0.49  --  0.42* <0.30*  CALCIUM 7.8*  --  7.6* 7.0*  MG  --  1.2*  --  2.2  PHOS  --   --   --  UNABLE TO REPORT DUE TO ICTERUS   GFR: CrCl cannot be calculated (This lab value cannot be used to calculate CrCl because it is not a number: <0.30). Liver Function Tests: Recent Labs  Lab 10/12/19 1521 10/13/19 0327 10/13/19 1155  AST 338* 268* 286*  ALT 79* 71* 60*  ALKPHOS 223* 181* 161*  BILITOT 12.7* 16.7* 15.5*  PROT 6.9 6.3* 5.4*  ALBUMIN 3.2* 2.9* 2.6*   No results for input(s): LIPASE, AMYLASE in the last 168 hours. Recent Labs  Lab 10/12/19 1630 10/13/19 0327  AMMONIA 57* 53*   Coagulation Profile: Recent Labs  Lab 10/12/19 1630 10/12/19 2224  INR 1.1 1.1   Cardiac Enzymes: No results for input(s): CKTOTAL, CKMB, CKMBINDEX, TROPONINI in the last 168 hours. BNP (last 3 results) No results for input(s): PROBNP in the last 8760 hours. HbA1C: No results for input(s): HGBA1C in the last 72 hours. CBG: Recent Labs  Lab 10/13/19 0654 10/13/19 1200  GLUCAP 123* 123*   Lipid Profile: No results for input(s): CHOL, HDL, LDLCALC, TRIG, CHOLHDL, LDLDIRECT in the last 72 hours. Thyroid Function Tests: Recent Labs     10/12/19 2224  TSH 0.742   Anemia Panel: Recent Labs    10/12/19 2224 10/13/19 0327  VITAMINB12 598  --   FOLATE 15.4  --   FERRITIN 4,306*  --   TIBC 197*  --   IRON 197*  --   RETICCTPCT  --  1.0   Sepsis Labs: No results for input(s): PROCALCITON, LATICACIDVEN in the last 168 hours.  Recent Results (from the past 240 hour(s))  SARS Coronavirus 2 by RT PCR (hospital order, performed in [facility] hospital lab) Nasopharyngeal Nasopharyngeal Swab     Status: None   Collection Time: 10/12/19  6:08 PM   Specimen: Nasopharyngeal Swab  Result Value Ref Range Status   SARS Coronavirus 2 NEGATIVE NEGATIVE Final    Comment: (NOTE) SARS-CoV-2 target nucleic acids are NOT DETECTED.  The SARS-CoV-2 RNA is generally detectable in upper and lower respiratory specimens during the acute phase of infection. The lowest concentration of SARS-CoV-2 viral copies this assay can detect is 250 copies / mL. A negative result does not preclude SARS-CoV-2 infection and should not be used as the sole basis for treatment or other patient management decisions.  A negative result may occur with improper specimen collection / handling, submission of specimen other than nasopharyngeal swab, presence of viral mutation(s) within the areas targeted by this assay, and inadequate number of viral copies (<250 copies / mL). A negative result must be combined with clinical observations, patient history, and epidemiological information.  Fact Sheet for Patients:   StrictlyIdeas.no  Fact Sheet for Healthcare Providers: BankingDealers.co.za  This test is not yet approved or  cleared by the [place] FDA and has been authorized for detection and/or diagnosis of SARS-CoV-2 by FDA under an Emergency Use Authorization (EUA).  This EUA will remain in effect (meaning this  test can be used) for the duration of the COVID-19 declaration under Section 564(b)(1) of the  Act, 21 U.S.C. section 360bbb-3(b)(1), unless the authorization is terminated or revoked sooner.  Performed at [facility], [address] [place], [place] [address]     RN Pressure Injury Documentation:     Estimated body mass index is 31.76 kg/m as calculated from the following:   Height as of this encounter: 5' 4"  (1.626 m).   Weight as of this encounter: 83.9 kg.  Malnutrition Type:      Malnutrition Characteristics:      Nutrition Interventions:     Radiology Studies: EEG  Result Date: 10/13/2019 Lora Havens, MD     10/13/2019  3:46 PM Patient Name: Ashlee Hawkins MRN: [id number] Epilepsy Attending: Lora Havens Referring Physician/Provider: Dr Eugenie Norrie Date: 10/13/2019 Duration: 20.33 mins Patient history: 39  year old female with breakthrough seizures secondary to inability to take PO anticonvulsant at home as well as hyponatremia and hypomagnesemia in the setting of a one week history of N/V. EEG to evaluate for seizure. Level of alertness: Awake AEDs during EEG study: OXC, LEV Technical aspects: This EEG study was done with scalp electrodes positioned according to the 10-20 International system of electrode placement. Electrical activity was acquired at a sampling rate of 500Hz  and reviewed with a high frequency filter of 70Hz  and a low frequency filter of 1Hz . EEG data were recorded continuously and digitally stored. Description: The posterior dominant rhythm consists of 8-9 Hz activity of moderate voltage (25-35 uV) seen predominantly in posterior head regions, symmetric and reactive to eye opening and eye closing. EEG showed intermittent generalized 3 to 6 Hz theta-delta slowing. Physiologic photic driving was not seen during photic stimulation.  Hyperventilation was not performed.   ABNORMALITY -Intermittent slow, generalized IMPRESSION: This study is suggestive of mild diffuse encephalopathy, nonspecific etiology.  No seizures or epileptiform  discharges were seen throughout the recording. Lora Havens   MR BRAIN WO CONTRAST  Result Date: 10/13/2019 CLINICAL DATA:  Initial evaluation for acute seizure. EXAM: MRI HEAD WITHOUT CONTRAST TECHNIQUE: Multiplanar, multiecho pulse sequences of the brain and surrounding structures were obtained without intravenous contrast. COMPARISON:  Prior CT from 06/02/2019. FINDINGS: Brain: Cerebral volume within normal limits. Few scattered foci of subcentimeter T2/FLAIR hyperintensity noted within the supratentorial cerebral white matter, nonspecific, but felt to be within normal limits for age, and of doubtful significance in the acute setting. No abnormal foci of restricted diffusion to suggest acute or subacute ischemia or changes related to seizure. Gray-white matter differentiation maintained. No encephalomalacia to suggest chronic cortical infarction. No definite foci of susceptibility artifact to suggest acute or chronic intracranial hemorrhage. No mass lesion, midline shift or mass effect. No hydrocephalus or extra-axial fluid collection. Pituitary gland suprasellar region normal. Midline structures intact. Vascular: Major intracranial vascular flow voids are maintained. Skull and upper cervical spine: Craniocervical junction within normal limits. Bone marrow signal intensity normal. No scalp soft tissue abnormality. Sinuses/Orbits: Globes and orbital soft tissues demonstrate no acute finding. Mild scattered mucosal thickening noted within the ethmoidal air cells and maxillary sinuses. Small right greater than left mastoid effusions noted, of doubtful significance. Visualized nasopharynx within normal limits. Other: None. IMPRESSION: Normal brain MRI for age. No acute intracranial abnormality identified. Electronically Signed   By: Jeannine Boga M.D.   On: 10/13/2019 03:10   US Abdomen Limited RUQ  Result Date: 10/12/2019 CLINICAL DATA:  Elevated LFTs EXAM: ULTRASOUND ABDOMEN LIMITED RIGHT  UPPER  QUADRANT COMPARISON:  None. FINDINGS: Gallbladder: Prior cholecystectomy Common bile duct: Diameter: Normal caliber, 3 mm Liver: Heterogeneous echotexture compatible with fatty infiltration or intrinsic liver disease. Liver appears enlarged. No focal hepatic abnormality. Portal vein is patent on color Doppler imaging with normal direction of blood flow towards the liver. Other: None. IMPRESSION: Enlarged liver with fatty infiltration or intrinsic liver disease. Electronically Signed   By: Rolm Baptise M.D.   On: 10/12/2019 19:49   Scheduled Meds: . amLODipine  5 mg Oral Daily  . enoxaparin (LOVENOX) injection  40 mg Subcutaneous Q24H  . lactulose  30 g Oral TID  . lisinopril  10 mg Oral Daily  . melatonin  5 mg Oral QHS  . oxcarbazepine  600 mg Oral BID  . potassium chloride  40 mEq Oral BID  . rifaximin  550 mg Oral BID  . sertraline  50 mg Oral Daily   Continuous Infusions: . 0.9 % NaCl with KCl 40 mEq / L 75 mL/hr at 10/13/19 0914  . [START ON 10/14/2019] levETIRAcetam      LOS: 1 day   Kerney Elbe, DO [facility] PAGER is on [place]  If 7PM-7AM, please contact night-coverage [email or web address]

## 2019-10-13 NOTE — Procedures (Signed)
Patient Name: Ashlee Hawkins  MRN: [id number]  Epilepsy Attending: Charlsie Quest  Referring Physician/Provider: Dr Valente David Date: 10/13/2019 Duration: 20.33 mins  Patient history: 39  year old female with breakthrough seizures secondary to inability to take PO anticonvulsant at home as well as hyponatremia and hypomagnesemia in the setting of a one week history of N/V. EEG to evaluate for seizure.   Level of alertness: Awake  AEDs during EEG study: OXC, LEV  Technical aspects: This EEG study was done with scalp electrodes positioned according to the 10-20 International system of electrode placement. Electrical activity was acquired at a sampling rate of 500Hz  and reviewed with a high frequency filter of 70Hz  and a low frequency filter of 1Hz . EEG data were recorded continuously and digitally stored.   Description: The posterior dominant rhythm consists of 8-9 Hz activity of moderate voltage (25-35 uV) seen predominantly in posterior head regions, symmetric and reactive to eye opening and eye closing. EEG showed intermittent generalized 3 to 6 Hz theta-delta slowing. Physiologic photic driving was not seen during photic stimulation.  Hyperventilation was not performed.     ABNORMALITY -Intermittent slow, generalized  IMPRESSION: This study is suggestive of mild diffuse encephalopathy, nonspecific etiology.  No seizures or epileptiform discharges were seen throughout the recording.  Dalary Hollar 

## 2019-10-13 NOTE — ED Notes (Signed)
Rectal tube clamped for lactulose

## 2019-10-13 NOTE — Progress Notes (Signed)
eeg done °

## 2019-10-13 NOTE — ED Notes (Signed)
Pt rectal tube unclamped

## 2019-10-13 NOTE — Consult Note (Signed)
Ashlee Darby, MD [address]  [place]  [place], [place] [address]  Main: [phone]  Fax: [phone] Pager: [phone]   Consultation  Referring Provider:     No ref. provider found Primary Care Physician:  [facility] Primary Gastroenterologist: Althia Forts         Reason for Consultation:     Alcoholic hepatitis  Date of Admission:  10/12/2019 Date of Consultation:  10/13/2019         HPI:   Ashlee Hawkins is a 39 y.o. female with history of alcohol abuse, PTSD, bipolar, seizure disorder, hypertension who presented with 1 week history of nausea, vomiting, diarrhea, severe jaundice.  Patient had witnessed episode of seizure at [facility] ER yesterday.  Her last drink of alcohol was on 9/7.  She does have history of alcohol withdrawal symptoms.  She does report epigastric and right upper quadrant discomfort.  She underwent right upper quadrant ultrasound which revealed heterogeneous hepatic texture, hepatomegaly and patent portal vein, normal size CBD.  Labs revealed AST 338, ALT 79, alkaline phosphatase 223, total bilirubin 12.7.  Last normal total bilirubin was 3.5 in 08/2018.  She did have mildly elevated liver enzymes at that time. She does have severe hypokalemia, hypochloremia.  PT/INR normal.  CBC revealed hemoglobin 8.7, MCV 102, platelets 179.  Last normal hemoglobin in 08/2018 Patient is also evaluated by neurology with initiation of antiepileptic agents  When I interviewed the patient, she denied nausea, vomiting.  She does report mild upper abdominal discomfort.  She is eating her dinner.  She reports she had dark stool about 3 days ago.  NSAIDs: None  Antiplts/Anticoagulants/Anti thrombotics: None  GI Procedures: None  Past Medical History:  Diagnosis Date  . Alcoholism ([place])   . Bipolar 1 disorder ([place])   . Hypertension   . Liver disease   . PTSD (post-traumatic stress disorder)   . Seizures ([place])     Past Surgical History:  Procedure  Laterality Date  . CHOLECYSTECTOMY      Prior to Admission medications   Medication Sig Start Date End Date Taking? Authorizing Provider  amLODipine (NORVASC) 10 MG tablet Take 10 mg by mouth daily.   Yes [provider]  clotrimazole (LOTRIMIN) 1 % external solution Apply 1 application topically 2 (two) times daily.   Yes [provider]  diclofenac sodium (VOLTAREN) 1 % GEL Apply 2 g topically 4 (four) times daily as needed (pain).   Yes [provider]  diphenhydrAMINE (BENADRYL) 25 mg capsule Take 25 mg by mouth every 6 (six) hours as needed for itching or allergies.  05/28/16  Yes [provider]  Docusate Sodium (DSS) 100 MG CAPS Take 100 mg by mouth daily.    Yes [provider]  hydrOXYzine (ATARAX/VISTARIL) 50 MG tablet Take 50 mg by mouth 3 (three) times daily as needed.   Yes [provider]  ibuprofen (ADVIL) 600 MG tablet Take 600 mg by mouth every 6 (six) hours as needed.   Yes [provider]  lisinopril (PRINIVIL,ZESTRIL) 20 MG tablet Take 20 mg by mouth daily.    Yes [provider]  Melatonin 3 MG TABS Take 3 mg by mouth at bedtime.   Yes [provider]  Multiple Vitamin (MULTI-VITAMIN) tablet Take 1 tablet by mouth daily.   Yes [provider]  nicotine polacrilex (COMMIT) 2 MG lozenge Place 2 mg inside cheek every 2 (two) hours as needed.    Yes  [provider]  omeprazole (PRILOSEC) 20 MG capsule Take 20 mg by mouth daily.    Yes [provider]  sertraline (ZOLOFT) 100 MG tablet Take 100 mg by mouth daily.    Yes [provider]  thiamine 100 MG tablet Take 100 mg by mouth daily.    Yes [provider]  traZODone (DESYREL) 50 MG tablet Take 50 mg by mouth at bedtime.    Yes [provider]  zinc gluconate 50 MG tablet Take 50 mg by mouth daily.    Yes [provider]  naltrexone (DEPADE) 50 MG tablet Take 50 mg by mouth  daily. Patient not taking: Reported on 10/13/2019    [provider]  ondansetron (ZOFRAN ODT) 4 MG disintegrating tablet Take 1 tablet (4 mg total) by mouth every 8 (eight) hours as needed for nausea or vomiting. Patient not taking: Reported on 10/13/2019 06/02/19   Duanne Guess, PA-C  oxcarbazepine (TRILEPTAL) 600 MG tablet Take 600 mg by mouth 2 (two) times daily. Patient not taking: Reported on 10/13/2019    [provider]   Current Facility-Administered Medications:  .  0.9 % NaCl with KCl 40 mEq / L  infusion, , Intravenous, Continuous, Raiford Noble [place], [place], Last Rate: 75 mL/hr at 10/13/19 1803, Rate Verify at 10/13/19 1803 .  amLODipine (NORVASC) tablet 5 mg, 5 mg, Oral, Daily, Mansy, Jan A, MD .  calcium gluconate 1 g/ 50 mL sodium chloride IVPB, 1 g, Intravenous, Once, Sheikh, Omair Latif, DO .  cyclobenzaprine (FLEXERIL) tablet 5-10 mg, 5-10 mg, Oral, TID PRN, Mansy, Jan A, MD .  enoxaparin (LOVENOX) injection 40 mg, 40 mg, Subcutaneous, Q24H, Mansy, Jan A, MD, 40 mg at 10/12/19 2214 .  hydrALAZINE (APRESOLINE) injection 10 mg, 10 mg, Intravenous, Q6H PRN, Sheikh, Omair Latif, DO .  lactulose ([place]) 10 GM/15ML solution 30 g, 30 g, Oral, BID PRN, Eaven Schwager, Tally Due, MD .  lactulose (CHRONULAC) 10 GM/15ML solution 30 g, 30 g, Oral, TID, Mansy, Jan A, MD .  Derrill Memo ON 10/14/2019] levETIRAcetam (KEPPRA) IVPB 500 mg/100 mL premix, 500 mg, Intravenous, Q12H, Kerney Elbe, MD .  lisinopril (ZESTRIL) tablet 10 mg, 10 mg, Oral, Daily, Mansy, Jan A, MD .  LORazepam (ATIVAN) injection 2 mg, 2 mg, Intravenous, PRN, Sharion Settler, NP .  magnesium hydroxide (MILK OF MAGNESIA) suspension 30 mL, 30 mL, Oral, Daily PRN, Mansy, Jan A, MD .  melatonin tablet 5 mg, 5 mg, Oral, QHS, Mansy, Jan A, MD .  ondansetron (ZOFRAN) tablet 4 mg, 4 mg, Oral, Q6H PRN **OR** ondansetron (ZOFRAN) injection 4 mg, 4 mg, Intravenous, Q6H PRN, Mansy, Jan A, MD, 4 mg at 10/12/19 2318 .   ondansetron (ZOFRAN-ODT) disintegrating tablet 4 mg, 4 mg, Oral, Q8H PRN, Mansy, Jan A, MD .  Oxcarbazepine (TRILEPTAL) tablet 600 mg, 600 mg, Oral, BID, Mansy, Jan A, MD .  potassium chloride SA (KLOR-CON) CR tablet 40 mEq, 40 mEq, Oral, BID, Sheikh, Omair Latif, DO .  rifaximin Doreene Nest) tablet 550 mg, 550 mg, Oral, BID, Deandrae Wajda, Tally Due, MD .  sertraline (ZOLOFT) tablet 50 mg, 50 mg, Oral, Daily, Mansy, Jan A, MD  Current Outpatient Medications:  .  amLODipine (NORVASC) 10 MG tablet, Take 10 mg by mouth daily., Disp: , Rfl:  .  clotrimazole (LOTRIMIN) 1 % external solution, Apply 1 application topically 2 (two) times daily., Disp: , Rfl:  .  diclofenac sodium (VOLTAREN) 1 % GEL, Apply 2 g topically 4 (four) times daily  as needed (pain)., Disp: , Rfl:  .  diphenhydrAMINE (BENADRYL) 25 mg capsule, Take 25 mg by mouth every 6 (six) hours as needed for itching or allergies. , Disp: , Rfl:  .  Docusate Sodium (DSS) 100 MG CAPS, Take 100 mg by mouth daily. , Disp: , Rfl:  .  hydrOXYzine (ATARAX/VISTARIL) 50 MG tablet, Take 50 mg by mouth 3 (three) times daily as needed., Disp: , Rfl:  .  ibuprofen (ADVIL) 600 MG tablet, Take 600 mg by mouth every 6 (six) hours as needed., Disp: , Rfl:  .  lisinopril (PRINIVIL,ZESTRIL) 20 MG tablet, Take 20 mg by mouth daily. , Disp: , Rfl:  .  Melatonin 3 MG TABS, Take 3 mg by mouth at bedtime., Disp: , Rfl:  .  Multiple Vitamin (MULTI-VITAMIN) tablet, Take 1 tablet by mouth daily., Disp: , Rfl:  .  nicotine polacrilex (COMMIT) 2 MG lozenge, Place 2 mg inside cheek every 2 (two) hours as needed. , Disp: , Rfl:  .  omeprazole (PRILOSEC) 20 MG capsule, Take 20 mg by mouth daily. , Disp: , Rfl:  .  sertraline (ZOLOFT) 100 MG tablet, Take 100 mg by mouth daily. , Disp: , Rfl:  .  thiamine 100 MG tablet, Take 100 mg by mouth daily. , Disp: , Rfl:  .  traZODone (DESYREL) 50 MG tablet, Take 50 mg by mouth at bedtime. , Disp: , Rfl:  .  zinc gluconate 50 MG tablet,  Take 50 mg by mouth daily. , Disp: , Rfl:  .  naltrexone (DEPADE) 50 MG tablet, Take 50 mg by mouth daily. (Patient not taking: Reported on 10/13/2019), Disp: , Rfl:  .  ondansetron (ZOFRAN ODT) 4 MG disintegrating tablet, Take 1 tablet (4 mg total) by mouth every 8 (eight) hours as needed for nausea or vomiting. (Patient not taking: Reported on 10/13/2019), Disp: 20 tablet, Rfl: 0 .  oxcarbazepine (TRILEPTAL) 600 MG tablet, Take 600 mg by mouth 2 (two) times daily. (Patient not taking: Reported on 10/13/2019), Disp: , Rfl:    Family History  Problem Relation Age of Onset  . Heart failure Mother      Social History   Tobacco Use  . Smoking status: Current Every Day Smoker    Packs/day: 1.00    Types: Cigarettes  . Smokeless tobacco: Never Used  Vaping Use  . Vaping Use: Never used  Substance Use Topics  . Alcohol use: Yes    Alcohol/week: 36.0 standard drinks    Types: 36 Shots of liquor per week  . Drug use: Not Currently    Types: Cocaine, Marijuana    Allergies as of 10/12/2019  . (No Known Allergies)    Review of Systems:    All systems reviewed and negative except where noted in HPI.   Physical Exam:  Vital signs in last 24 hours: Temp:  [98.1 F (36.7 C)] 98.1 F (36.7 C) (09/08 2333) Pulse Rate:  [93-113] 94 (09/09 1600) Resp:  [13-26] 18 (09/09 1600) BP: (116-159)/(61-84) 116/61 (09/09 1600) SpO2:  [92 %-100 %] 98 % (09/09 1600)   General: Ill-appearing, dull, cooperative in NAD Head:  Normocephalic and atraumatic. Eyes: Deep icterus.   Conjunctiva pink. PERRLA. Ears:  Normal auditory acuity. Neck:  Supple; no masses or thyroidomegaly Lungs: Respirations even and unlabored. Lungs clear to auscultation bilaterally.   No wheezes, crackles, or rhonchi.  Heart:  Regular rate and rhythm;  Without murmur, clicks, rubs or gallops Abdomen:  Soft, nondistended, nontender. Normal bowel sounds.  No appreciable masses or hepatomegaly.  No rebound or guarding.  Rectal:  Not  performed. Msk:  Symmetrical without gross deformities.  Strength generalized weakness Extremities: Trace edema, no cyanosis or clubbing. Neurologic:  Alert and oriented x3; slow speech, grossly normal neurologically. Skin:  Intact without significant lesions or rashes. Psych:  Alert and cooperative. Normal affect.  LAB RESULTS: CBC Latest Ref Rng & Units 10/13/2019 10/12/2019 08/05/2018  WBC 4.0 - 10.5 K/uL 8.2 10.7(H) 7.1  Hemoglobin 12.0 - 15.0 g/dL 8.7(L) 9.8(L) 16.2(H)  Hematocrit 36 - 46 % 24.8(L) 27.7(L) 46.6(H)  Platelets 150 - 400 K/uL 179 186 273    BMET BMP Latest Ref Rng & Units 10/13/2019 10/13/2019 10/12/2019  Glucose 70 - 99 mg/dL 117(H) 120(H) 203(H)  BUN 6 - 20 mg/dL <5(L) <5(L) 5(L)  Creatinine 0.44 - 1.00 mg/dL <0.30(L) 0.42(L) 0.49  Sodium 135 - 145 mmol/L 133(L) 130(L) 119(LL)  Potassium 3.5 - 5.1 mmol/L 2.1(LL) <2.0(LL) <2.0(LL)  Chloride 98 - 111 mmol/L 84(L) 77(L) <65(LL)  CO2 22 - 32 mmol/L 35(H) 33(H) 20(L)  Calcium 8.9 - 10.3 mg/dL 7.0(L) 7.6(L) 7.8(L)    LFT Hepatic Function Latest Ref Rng & Units 10/13/2019 10/13/2019 10/12/2019  Total Protein 6.5 - 8.1 g/dL 5.4(L) 6.3(L) 6.9  Albumin 3.5 - 5.0 g/dL 2.6(L) 2.9(L) 3.2(L)  AST 15 - 41 U/L 286(H) 268(H) 338(H)  ALT 0 - 44 U/L 60(H) 71(H) 79(H)  Alk Phosphatase 38 - 126 U/L 161(H) 181(H) 223(H)  Total Bilirubin 0.3 - 1.2 mg/dL 15.5(H) 16.7(H) 12.7(H)  Bilirubin, Direct 0.1 - 0.5 mg/dL - - -     STUDIES: EEG  Result Date: 10/13/2019 Lora Havens, MD     10/13/2019  3:46 PM Patient Name: Ashlee Hawkins MRN: [id number] Epilepsy Attending: Lora Havens Referring Physician/Provider: Dr Eugenie Norrie Date: 10/13/2019 Duration: 20.33 mins Patient history: 39  year old female with breakthrough seizures secondary to inability to take PO anticonvulsant at home as well as hyponatremia and hypomagnesemia in the setting of a one week history of N/V. EEG to evaluate for seizure. Level of alertness: Awake AEDs during EEG study:  OXC, LEV Technical aspects: This EEG study was done with scalp electrodes positioned according to the 10-20 International system of electrode placement. Electrical activity was acquired at a sampling rate of 500Hz  and reviewed with a high frequency filter of 70Hz  and a low frequency filter of 1Hz . EEG data were recorded continuously and digitally stored. Description: The posterior dominant rhythm consists of 8-9 Hz activity of moderate voltage (25-35 uV) seen predominantly in posterior head regions, symmetric and reactive to eye opening and eye closing. EEG showed intermittent generalized 3 to 6 Hz theta-delta slowing. Physiologic photic driving was not seen during photic stimulation.  Hyperventilation was not performed.   ABNORMALITY -Intermittent slow, generalized IMPRESSION: This study is suggestive of mild diffuse encephalopathy, nonspecific etiology.  No seizures or epileptiform discharges were seen throughout the recording. Lora Havens   MR BRAIN WO CONTRAST  Result Date: 10/13/2019 CLINICAL DATA:  Initial evaluation for acute seizure. EXAM: MRI HEAD WITHOUT CONTRAST TECHNIQUE: Multiplanar, multiecho pulse sequences of the brain and surrounding structures were obtained without intravenous contrast. COMPARISON:  Prior CT from 06/02/2019. FINDINGS: Brain: Cerebral volume within normal limits. Few scattered foci of subcentimeter T2/FLAIR hyperintensity noted within the supratentorial cerebral white matter, nonspecific, but felt to be within normal limits for age, and of doubtful significance in the acute setting. No abnormal foci of restricted diffusion to suggest acute  or subacute ischemia or changes related to seizure. Gray-white matter differentiation maintained. No encephalomalacia to suggest chronic cortical infarction. No definite foci of susceptibility artifact to suggest acute or chronic intracranial hemorrhage. No mass lesion, midline shift or mass effect. No hydrocephalus or extra-axial fluid  collection. Pituitary gland suprasellar region normal. Midline structures intact. Vascular: Major intracranial vascular flow voids are maintained. Skull and upper cervical spine: Craniocervical junction within normal limits. Bone marrow signal intensity normal. No scalp soft tissue abnormality. Sinuses/Orbits: Globes and orbital soft tissues demonstrate no acute finding. Mild scattered mucosal thickening noted within the ethmoidal air cells and maxillary sinuses. Small right greater than left mastoid effusions noted, of doubtful significance. Visualized nasopharynx within normal limits. Other: None. IMPRESSION: Normal brain MRI for age. No acute intracranial abnormality identified. Electronically Signed   By: Jeannine Boga M.D.   On: 10/13/2019 03:10   US Abdomen Limited RUQ  Result Date: 10/12/2019 CLINICAL DATA:  Elevated LFTs EXAM: ULTRASOUND ABDOMEN LIMITED RIGHT UPPER QUADRANT COMPARISON:  None. FINDINGS: Gallbladder: Prior cholecystectomy Common bile duct: Diameter: Normal caliber, 3 mm Liver: Heterogeneous echotexture compatible with fatty infiltration or intrinsic liver disease. Liver appears enlarged. No focal hepatic abnormality. Portal vein is patent on color Doppler imaging with normal direction of blood flow towards the liver. Other: None. IMPRESSION: Enlarged liver with fatty infiltration or intrinsic liver disease. Electronically Signed   By: Rolm Baptise M.D.   On: 10/12/2019 19:49      Impression / Plan:   Ashlee Hawkins is a 39 y.o. female with history of bipolar, alcohol abuse, seizure disorder who presented with acute alcoholic hepatitis and witnessed episode of seizure due to noncompliance  Acute alcoholic hepatitis: Patient is not in acute liver failure Acute viral hepatitis panel is negative, elevated ferritin levels.  No evidence of biliary obstruction Madrey discriminant function is 10.9, therefore, patient does not meet criteria for prednisolone use as the score is  less than 32 Diet as tolerated, high-protein diet Complete abstinence from alcohol use Recommend to start gabapentin 100 mg 2-3 times daily to prevent alcohol withdrawal Recommend to start rifaximin 550 mg 2 times a day along with lactulose 30 g 1-2 times daily CIWA protocol Monitor LFTs daily  Macrocytic anemia:Secondary to bone marrow suppression from alcohol intoxication  Elevated ferritin secondary to alcoholic hepatitis, normal B12 and folate levels No evidence of active GI bleed Recommend Protonix 40 mg IV twice daily  Thank you for involving me in the care of this patient.      LOS: 1 day   Ashlee Sear, MD  10/13/2019, 6:06 PM   Note: This dictation was prepared with Dragon dictation along with smaller phrase technology. Any transcriptional errors that result from this process are unintentional.

## 2019-10-13 NOTE — Progress Notes (Signed)
PT Cancellation Note  Patient Details Name: Ashlee Hawkins MRN: [id number] DOB: [birth year]   Cancelled Treatment:    Reason Eval/Treat Not Completed: [name] level of consciousness;Medical issues which prohibited therapy  Spoke with nurse who reports that pt is not ready for PT today even if K+ was appropriate.  Suggests she may be ready to try PT tomorrow, held this date.  Walking by room pt looked confused and was wearing personal safety mitts.   Malachi Pro, DPT 10/13/2019, 11:21 AM

## 2019-10-13 NOTE — ED Notes (Signed)
By all indications, the last time patient refilled Trileptal 600 mg was 12/08/2018 at [facility]. [name] father and husband both confirmed she gets all her medications from [facility] [place]. In an effort to verify current medication list this Tech contacted [facility] and [facility].

## 2019-10-13 NOTE — ED Notes (Signed)
After EEG- pt accidentally threw food tray in floor- floor cleaned of food- will call dietary to send another tray

## 2019-10-13 NOTE — ED Notes (Signed)
Called dietary for replacement tray

## 2019-10-13 NOTE — Consult Note (Addendum)
NEURO HOSPITALIST CONSULT NOTE   Requestig physician: Dr. Arville CareMansy  Reason for Consult: Breakthrough seizure  History obtained from:   Patient and Chart     HPI:                                                                                                                                          Ashlee Hawkins is an 39 y.o. female with a PMHx of seizures, PTSD, Bipolar 1 disorder, HTN, chronic liver disease and alcoholism who presented to the ED yesterday afternoon after having a breakthrough seizure at the [facility], where she was waiting to be seen for evaluation of a one week history of nausea, vomiting, dizziness and diarrhea. Her last EtOH intake was the night before. On arrival to the ED, scleral icterus was noted. She also was having trouble speaking, which her father reported as starting after her seizure. She had not been able to take her seizure medications over the past week due to her N/V.   While in the ED, she had another seizure, described as follows: "Patient had a seizure. Remains in recliner chair.  Extension to arms and legs with jerking motion seen to extremities.  Seizure duration approximately 5 seconds.  Brief postictal states. Respirations regular and non labored."  In the ED, labs revealed hyponatremia (119) and hypokalemia (<2). Mg was also low at 1.2 and ammonia was elevated at 57. Abnormal LFTs were also noted. She was given a fluid bolus and administered electrolytes. She was loaded with 3000 mg IV Keppra in the ED at 2230. Oxcarbazepine has been continued. Neurology was consulted for seizure management.   Her anticonvulsant regimen at home consists of Trileptal 600 mg po BID.    The patient states that she thinks that most if not all of her prior seizures have been in the setting of EtOH withdrawal. She states that she has never had a seizure while actively drinking EtOH, only after stopping, but cannot clarify further if the seizures only  occur acutely after stopping EtOH, or whether they also occur after being abstinent for a longer period of time. She endorses drinking about 8 oz of hard liquor per day with most recent drink being "yesterday".    Past Medical History:  Diagnosis Date  . Alcoholism (HCC)   . Bipolar 1 disorder (HCC)   . Hypertension   . Liver disease   . PTSD (post-traumatic stress disorder)   . Seizures (HCC)     Past Surgical History:  Procedure Laterality Date  . CHOLECYSTECTOMY      Family History  Problem Relation Age of Onset  . Heart failure Mother               Social History:  reports that she has been  smoking cigarettes. She has been smoking about 1.00 pack per day. She has never used smokeless tobacco. She reports current alcohol use of about 36.0 standard drinks of alcohol per week. She reports previous drug use. Drugs: Cocaine and Marijuana.  No Known Allergies  Home Medications: No current facility-administered medications on file prior to encounter.   Current Outpatient Medications on File Prior to Encounter  Medication Sig Dispense Refill  . amLODipine (NORVASC) 10 MG tablet Take 10 mg by mouth daily.    . clotrimazole (LOTRIMIN) 1 % external solution Apply 1 application topically 2 (two) times daily.    . diclofenac sodium (VOLTAREN) 1 % GEL Apply 2 g topically 4 (four) times daily as needed (pain).    Marland Kitchen diphenhydrAMINE (BENADRYL) 25 mg capsule Take 25 mg by mouth every 6 (six) hours as needed for itching or allergies.     Tery Sanfilippo Sodium (DSS) 100 MG CAPS Take 100 mg by mouth daily.     . hydrOXYzine (ATARAX/VISTARIL) 50 MG tablet Take 50 mg by mouth 3 (three) times daily as needed.    Marland Kitchen ibuprofen (ADVIL) 600 MG tablet Take 600 mg by mouth every 6 (six) hours as needed.    Marland Kitchen lisinopril (PRINIVIL,ZESTRIL) 20 MG tablet Take 20 mg by mouth daily.     . Melatonin 3 MG TABS Take 3 mg by mouth at bedtime.    . Multiple Vitamin (MULTI-VITAMIN) tablet Take 1 tablet by mouth daily.     . nicotine polacrilex (COMMIT) 2 MG lozenge Place 2 mg inside cheek every 2 (two) hours as needed.     Marland Kitchen omeprazole (PRILOSEC) 20 MG capsule Take 20 mg by mouth daily.     . sertraline (ZOLOFT) 100 MG tablet Take 100 mg by mouth daily.     Marland Kitchen thiamine 100 MG tablet Take 100 mg by mouth daily.     . traZODone (DESYREL) 50 MG tablet Take 50 mg by mouth at bedtime.     Marland Kitchen zinc gluconate 50 MG tablet Take 50 mg by mouth daily.     . naltrexone (DEPADE) 50 MG tablet Take 50 mg by mouth daily. (Patient not taking: Reported on 10/13/2019)    . ondansetron (ZOFRAN ODT) 4 MG disintegrating tablet Take 1 tablet (4 mg total) by mouth every 8 (eight) hours as needed for nausea or vomiting. (Patient not taking: Reported on 10/13/2019) 20 tablet 0  . oxcarbazepine (TRILEPTAL) 600 MG tablet Take 600 mg by mouth 2 (two) times daily. (Patient not taking: Reported on 10/13/2019)      INPATIENT MEDICATIONS:                                                                                                                     Scheduled: . amLODipine  5 mg Oral Daily  . enoxaparin (LOVENOX) injection  40 mg Subcutaneous Q24H  . lactulose  300 mL Rectal BID  . lisinopril  10 mg Oral Daily  . melatonin  5 mg Oral QHS  .  oxcarbazepine  600 mg Oral BID  . potassium chloride  40 mEq Oral BID  . sertraline  50 mg Oral Daily   Continuous: . 0.9 % NaCl with KCl 40 mEq / L 75 mL/hr at 10/13/19 0914  . levETIRAcetam    . potassium chloride       ROS:                                                                                                                                       Positive for presyncopal sensation, RUQ abdominal discomfort, loose bowel movements. Negative for dysuria, flank pain, oliguria, cough, wheezing and dyspnea. Other symptoms as per HPI with comprehensive ROS otherwise negative.   Blood pressure 122/84, pulse (!) 106, temperature 98.1 F (36.7 C), temperature source Oral, resp. rate 13, height 5'  4" (1.626 m), weight 83.9 kg, SpO2 94 %.   General Examination:                                                                                                       Physical Exam  General: Morbidly obese HEENT-  Orient/AT. Jaundice and scleral icterus are noted.  Lungs: Respirations unlabored Ext: Warm and well perfused.  Skin: Diffuse jaundice  Neurological Examination Mental Status: Drowsy with decreased level of alertness. Poor attention. Speech is fluent but dysarthric in the context of dry oral mucosa. Naming intact. Able to follow all simple commands. Oriented to time and place.  Cranial Nerves: II: Visual fields intact with no extinction to DSS. PERRL III,IV, VI: No ptosis. EOMI with saccadic pursuits noted.  V,VII: Smile symmetric, facial temp sensation equal bilaterally VIII: Hearing intact to voice IX,X: No hoarseness noted.  XI: Symmetric XII: Midline tongue extension Motor: Right : Upper extremity   5/5    Left:     Upper extremity   5/5  Lower extremity   5/5     Lower extremity   5/5 No pronator drift Sensory: Temp and light touch sensation intact x 4. No extinction to DSS.  Deep Tendon Reflexes: 2+ and symmetric throughout Plantars: Right: downgoing   Left: downgoing Cerebellar: No gross ataxia with FNF, performed with mitts on.  Gait: Deferred   Lab Results: Basic Metabolic Panel: Recent Labs  Lab 10/12/19 1521 10/12/19 1630 10/13/19 0327  NA 119*  --  130*  K <2.0*  --  <2.0*  CL <65*  --  77*  CO2 20*  --  33*  GLUCOSE 203*  --  120*  BUN 5*  --  <5*  CREATININE 0.49  --  0.42*  CALCIUM 7.8*  --  7.6*  MG  --  1.2*  --     CBC: Recent Labs  Lab 10/12/19 1521 10/13/19 0327  WBC 10.7* 8.2  NEUTROABS 8.9*  --   HGB 9.8* 8.7*  HCT 27.7* 24.8*  MCV 100.7* 102.1*  PLT 186 179    Cardiac Enzymes: No results for input(s): CKTOTAL, CKMB, CKMBINDEX, TROPONINI in the last 168 hours.  Lipid Panel: No results for input(s): CHOL, TRIG, HDL, CHOLHDL,  VLDL, LDLCALC in the last 168 hours.  Imaging: MR BRAIN WO CONTRAST  Result Date: 10/13/2019 CLINICAL DATA:  Initial evaluation for acute seizure. EXAM: MRI HEAD WITHOUT CONTRAST TECHNIQUE: Multiplanar, multiecho pulse sequences of the brain and surrounding structures were obtained without intravenous contrast. COMPARISON:  Prior CT from 06/02/2019. FINDINGS: Brain: Cerebral volume within normal limits. Few scattered foci of subcentimeter T2/FLAIR hyperintensity noted within the supratentorial cerebral white matter, nonspecific, but felt to be within normal limits for age, and of doubtful significance in the acute setting. No abnormal foci of restricted diffusion to suggest acute or subacute ischemia or changes related to seizure. Gray-white matter differentiation maintained. No encephalomalacia to suggest chronic cortical infarction. No definite foci of susceptibility artifact to suggest acute or chronic intracranial hemorrhage. No mass lesion, midline shift or mass effect. No hydrocephalus or extra-axial fluid collection. Pituitary gland suprasellar region normal. Midline structures intact. Vascular: Major intracranial vascular flow voids are maintained. Skull and upper cervical spine: Craniocervical junction within normal limits. Bone marrow signal intensity normal. No scalp soft tissue abnormality. Sinuses/Orbits: Globes and orbital soft tissues demonstrate no acute finding. Mild scattered mucosal thickening noted within the ethmoidal air cells and maxillary sinuses. Small right greater than left mastoid effusions noted, of doubtful significance. Visualized nasopharynx within normal limits. Other: None. IMPRESSION: Normal brain MRI for age. No acute intracranial abnormality identified. Electronically Signed   By: Rise Mu M.D.   On: 10/13/2019 03:10   US Abdomen Limited RUQ  Result Date: 10/12/2019 CLINICAL DATA:  Elevated LFTs EXAM: ULTRASOUND ABDOMEN LIMITED RIGHT UPPER QUADRANT COMPARISON:   None. FINDINGS: Gallbladder: Prior cholecystectomy Common bile duct: Diameter: Normal caliber, 3 mm Liver: Heterogeneous echotexture compatible with fatty infiltration or intrinsic liver disease. Liver appears enlarged. No focal hepatic abnormality. Portal vein is patent on color Doppler imaging with normal direction of blood flow towards the liver. Other: None. IMPRESSION: Enlarged liver with fatty infiltration or intrinsic liver disease. Electronically Signed   By: Charlett Nose M.D.   On: 10/12/2019 19:49    Assessment: 39 year old female with breakthrough seizures secondary to inability to take PO anticonvulsant at home as well as hyponatremia and hypomagnesemia in the setting of a one week history of N/V.  1. She was loaded with Keppra 3000 mg IV in the ED. This medication does not need to be continued as her seizure threshold is likely to normalize towards her baseline as hyponatremia and hypomagnesemia resolve.  2. Takes Ultram at home, which could also lower her seizure threshold.  3. EtOH withdrawal seizure is possible. However an EtOH level, which would have provided additional information, was not drawn in the ED.  4. Urine toxicology screen was negative.  5. MRI brain was normal.   Recommendations: 1. Trileptal at 600 mg po BID was ordered. However, the patient was not able to take this medication yesterday night as she had failed her swallow evaluation. For  now, will continue her on scheduled Keppra at 500 mg IV BID. 2. When she is able to take PO, restart Trileptal and overlap with Keppra by 2 days, then stop Keppra (half-life of the active metabolite of oxcarbazepine, licarbazepine, is 9 hours, so 5 half-lives are needed to reach full therapeutic blood levels).  3. EEG is pending 4. If possible, discontinue or lower the total daily dose of her home Ultram, which can lower the seizure threshold.   5. CIWA protocol.  6. Inpatient seizure precautions. 7. Outpatient seizure precautions: Per  [facility] statutes, patients with seizures are not allowed to drive until  they have been seizure-free for six months. Use caution when using heavy equipment or power tools. Avoid working on ladders or at heights. Take showers instead of baths. Ensure the water temperature is not too high on the home water heater. Do not go swimming alone. When caring for infants or small children, sit down when holding, feeding, or changing them to minimize risk of injury to the child in the event you have a seizure. Also, Maintain good sleep hygiene. Avoid alcohol.  Addendum: EEG report: This study is suggestive of mild diffuse encephalopathy, nonspecific to etiology.  No seizures or epileptiform discharges were seen throughout the recording.   Electronically signed: Dr. Caryl Pina 10/13/2019, 7:56 AM

## 2019-10-14 ENCOUNTER — Inpatient Hospital Stay: Payer: No Typology Code available for payment source

## 2019-10-14 DIAGNOSIS — R7989 Other specified abnormal findings of blood chemistry: Secondary | ICD-10-CM

## 2019-10-14 LAB — CBC WITH DIFFERENTIAL/PLATELET
Abs Immature Granulocytes: 0.08 10*3/uL — ABNORMAL HIGH (ref 0.00–0.07)
Abs Immature Granulocytes: 0.14 10*3/uL — ABNORMAL HIGH (ref 0.00–0.07)
Basophils Absolute: 0 10*3/uL (ref 0.0–0.1)
Basophils Absolute: 0.1 10*3/uL (ref 0.0–0.1)
Basophils Relative: 0 %
Basophils Relative: 1 %
Eosinophils Absolute: 0.3 10*3/uL (ref 0.0–0.5)
Eosinophils Absolute: 0.3 10*3/uL (ref 0.0–0.5)
Eosinophils Relative: 4 %
Eosinophils Relative: 3 %
HCT: 21.4 % — ABNORMAL LOW (ref 36.0–46.0)
HCT: 24.9 % — ABNORMAL LOW (ref 36.0–46.0)
Hemoglobin: 7.2 g/dL — ABNORMAL LOW (ref 12.0–15.0)
Hemoglobin: 8.6 g/dL — ABNORMAL LOW (ref 12.0–15.0)
Immature Granulocytes: 1 %
Immature Granulocytes: 1 %
Lymphocytes Relative: 21 %
Lymphocytes Relative: 22 %
Lymphs Abs: 1.9 10*3/uL (ref 0.7–4.0)
Lymphs Abs: 2.2 10*3/uL (ref 0.7–4.0)
MCH: 36.4 pg — ABNORMAL HIGH (ref 26.0–34.0)
MCH: 36.8 pg — ABNORMAL HIGH (ref 26.0–34.0)
MCHC: 33.6 g/dL (ref 30.0–36.0)
MCHC: 34.5 g/dL (ref 30.0–36.0)
MCV: 108.1 fL — ABNORMAL HIGH (ref 80.0–100.0)
MCV: 106.4 fL — ABNORMAL HIGH (ref 80.0–100.0)
Monocytes Absolute: 0.8 10*3/uL (ref 0.1–1.0)
Monocytes Absolute: 0.8 10*3/uL (ref 0.1–1.0)
Monocytes Relative: 9 %
Monocytes Relative: 8 %
Neutro Abs: 5.9 10*3/uL (ref 1.7–7.7)
Neutro Abs: 6.5 10*3/uL (ref 1.7–7.7)
Neutrophils Relative %: 65 %
Neutrophils Relative %: 65 %
Platelets: 209 10*3/uL (ref 150–400)
Platelets: 241 10*3/uL (ref 150–400)
RBC: 1.98 MIL/uL — ABNORMAL LOW (ref 3.87–5.11)
RBC: 2.34 MIL/uL — ABNORMAL LOW (ref 3.87–5.11)
RDW: 21.4 % — ABNORMAL HIGH (ref 11.5–15.5)
RDW: 21.8 % — ABNORMAL HIGH (ref 11.5–15.5)
Smear Review: NORMAL
Smear Review: NORMAL
WBC: 9 10*3/uL (ref 4.0–10.5)
WBC: 10 10*3/uL (ref 4.0–10.5)
nRBC: 0.3 % — ABNORMAL HIGH (ref 0.0–0.2)
nRBC: 0.2 % (ref 0.0–0.2)

## 2019-10-14 LAB — COMPREHENSIVE METABOLIC PANEL
ALT: 71 U/L — ABNORMAL HIGH (ref 0–44)
ALT: 78 U/L — ABNORMAL HIGH (ref 0–44)
ALT: 76 U/L — ABNORMAL HIGH (ref 0–44)
AST: 316 U/L — ABNORMAL HIGH (ref 15–41)
AST: 337 U/L — ABNORMAL HIGH (ref 15–41)
AST: 333 U/L — ABNORMAL HIGH (ref 15–41)
Albumin: 2.7 g/dL — ABNORMAL LOW (ref 3.5–5.0)
Albumin: 2.9 g/dL — ABNORMAL LOW (ref 3.5–5.0)
Albumin: 2.6 g/dL — ABNORMAL LOW (ref 3.5–5.0)
Alkaline Phosphatase: 176 U/L — ABNORMAL HIGH (ref 38–126)
Alkaline Phosphatase: 205 U/L — ABNORMAL HIGH (ref 38–126)
Alkaline Phosphatase: 224 U/L — ABNORMAL HIGH (ref 38–126)
Anion gap: 13 (ref 5–15)
Anion gap: 14 (ref 5–15)
Anion gap: 13 (ref 5–15)
BUN: 6 mg/dL (ref 6–20)
BUN: 5 mg/dL — ABNORMAL LOW (ref 6–20)
BUN: 5 mg/dL — ABNORMAL LOW (ref 6–20)
CO2: 34 mmol/L — ABNORMAL HIGH (ref 22–32)
CO2: 31 mmol/L (ref 22–32)
CO2: 32 mmol/L (ref 22–32)
Calcium: 7.8 mg/dL — ABNORMAL LOW (ref 8.9–10.3)
Calcium: 7.8 mg/dL — ABNORMAL LOW (ref 8.9–10.3)
Calcium: 8 mg/dL — ABNORMAL LOW (ref 8.9–10.3)
Chloride: 88 mmol/L — ABNORMAL LOW (ref 98–111)
Chloride: 89 mmol/L — ABNORMAL LOW (ref 98–111)
Chloride: 89 mmol/L — ABNORMAL LOW (ref 98–111)
Creatinine, Ser: <0.3 mg/dL — ABNORMAL LOW (ref 0.44–1.00)
Creatinine, Ser: <0.3 mg/dL — ABNORMAL LOW (ref 0.44–1.00)
Creatinine, Ser: 0.32 mg/dL — ABNORMAL LOW (ref 0.44–1.00)
GFR calc Af Amer: >60 mL/min (ref >60)
GFR calc non Af Amer: >60 mL/min (ref >60)
Glucose, Bld: 99 mg/dL (ref 70–99)
Glucose, Bld: 92 mg/dL (ref 70–99)
Glucose, Bld: 166 mg/dL — ABNORMAL HIGH (ref 70–99)
Potassium: 2.1 mmol/L — CL (ref 3.5–5.1)
Potassium: 2.5 mmol/L — CL (ref 3.5–5.1)
Potassium: 2.5 mmol/L — CL (ref 3.5–5.1)
Sodium: 135 mmol/L (ref 135–145)
Sodium: 134 mmol/L — ABNORMAL LOW (ref 135–145)
Sodium: 134 mmol/L — ABNORMAL LOW (ref 135–145)
Total Bilirubin: 14.6 mg/dL — ABNORMAL HIGH (ref 0.3–1.2)
Total Bilirubin: 15.8 mg/dL — ABNORMAL HIGH (ref 0.3–1.2)
Total Bilirubin: 12.7 mg/dL — ABNORMAL HIGH (ref 0.3–1.2)
Total Protein: 5.9 g/dL — ABNORMAL LOW (ref 6.5–8.1)
Total Protein: 6.4 g/dL — ABNORMAL LOW (ref 6.5–8.1)
Total Protein: 5.6 g/dL — ABNORMAL LOW (ref 6.5–8.1)

## 2019-10-14 LAB — GLUCOSE, CAPILLARY
Glucose-Capillary: 105 mg/dL — ABNORMAL HIGH (ref 70–99)
Glucose-Capillary: 95 mg/dL (ref 70–99)
Glucose-Capillary: 104 mg/dL — ABNORMAL HIGH (ref 70–99)
Glucose-Capillary: 128 mg/dL — ABNORMAL HIGH (ref 70–99)

## 2019-10-14 LAB — MAGNESIUM
Magnesium: 1.9 mg/dL (ref 1.7–2.4)
Magnesium: 1.8 mg/dL (ref 1.7–2.4)
Magnesium: 2.3 mg/dL (ref 1.7–2.4)

## 2019-10-14 LAB — HAPTOGLOBIN: Haptoglobin: 118 mg/dL (ref 33–278)

## 2019-10-14 LAB — PHOSPHORUS
Phosphorus: UNDETERMINED mg/dL (ref 2.5–4.6)
Phosphorus: UNDETERMINED mg/dL (ref 2.5–4.6)
Phosphorus: <1 mg/dL — CL (ref 2.5–4.6)

## 2019-10-14 LAB — AMMONIA: Ammonia: 53 umol/L — ABNORMAL HIGH (ref 9–35)

## 2019-10-14 IMAGING — CT CT HEAD W/O CM
3 series · 16 of 47 positions shown, 19 images · non-contrast
Comparison: [DATE]

CLINICAL DATA: Seizure, follow-up

EXAM:
CT HEAD WITHOUT CONTRAST
TECHNIQUE: Contiguous axial images were obtained from the base of the skull
through the vertex without intravenous contrast.

[Series 2: head wo · axial · 0.41mm/px · z∈[-120,+5]mm · 10 of 31 slices shown, 13 images]
[im 3/31  brain]
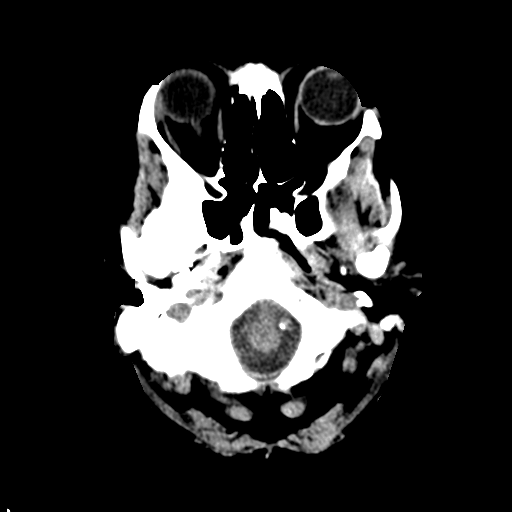
[im 3/31  bone]
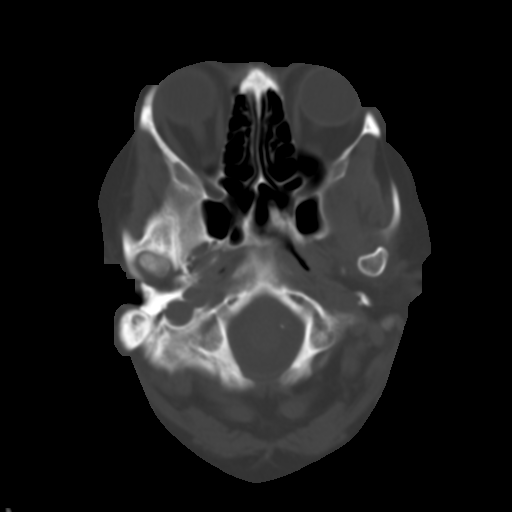
[im 6/31  brain]
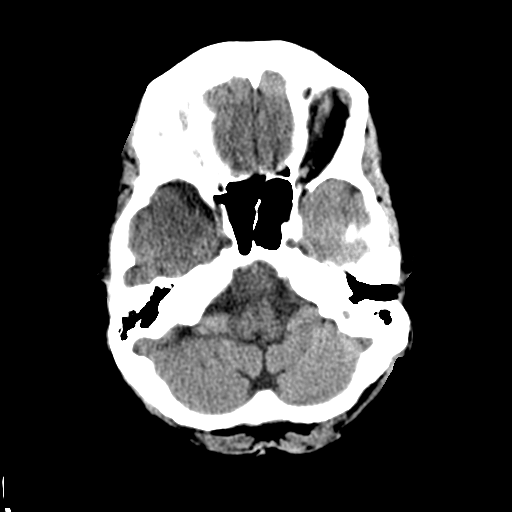
[im 9/31  brain]
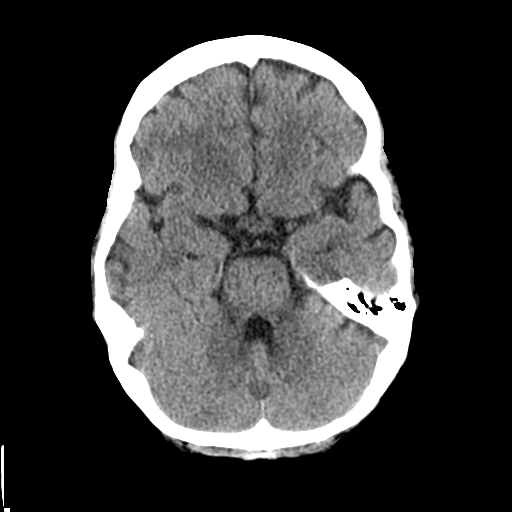
[im 11/31  brain]
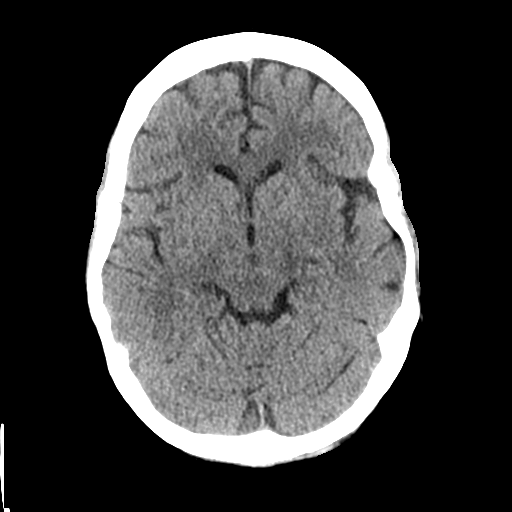
[im 14/31  brain]
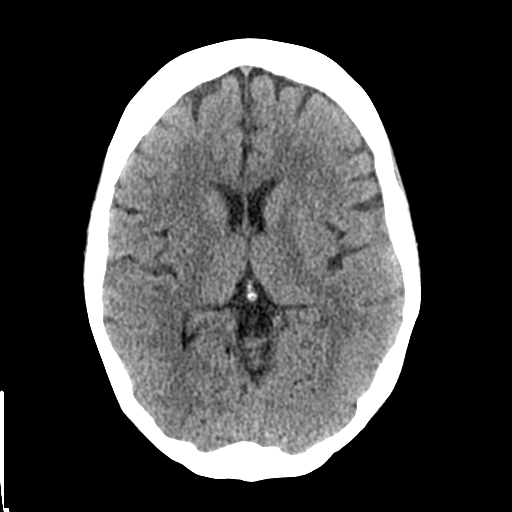
[im 14/31  bone]
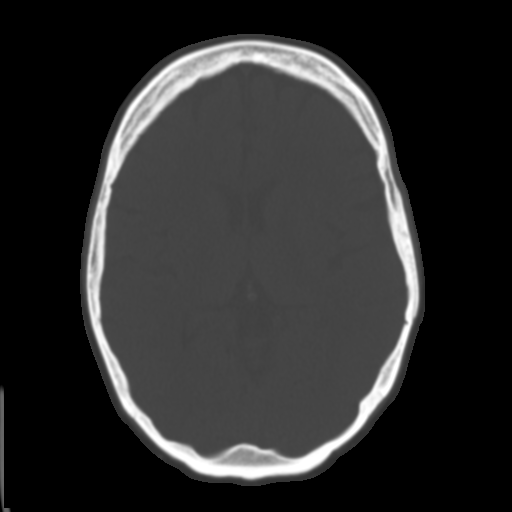
[im 17/31  brain]
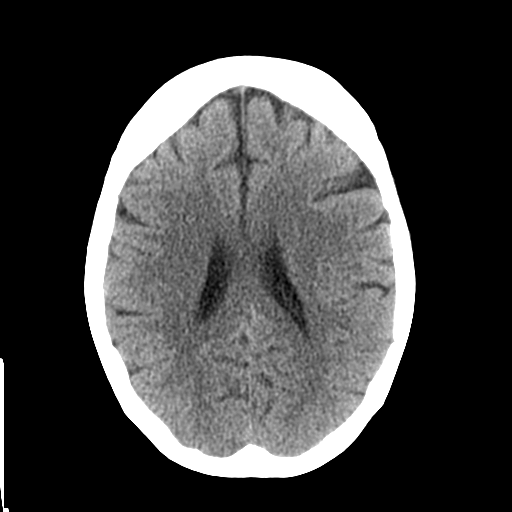
[im 20/31  brain]
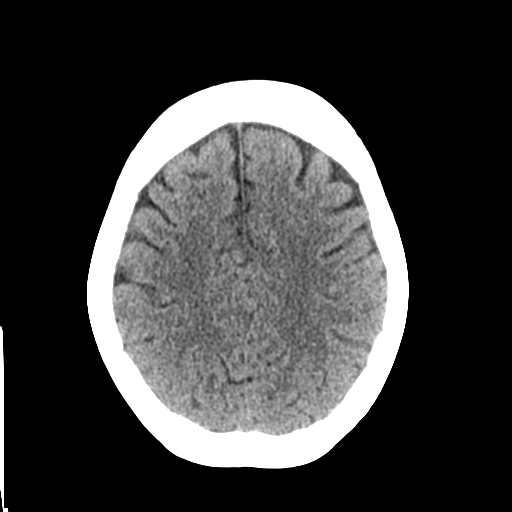
[im 23/31  brain]
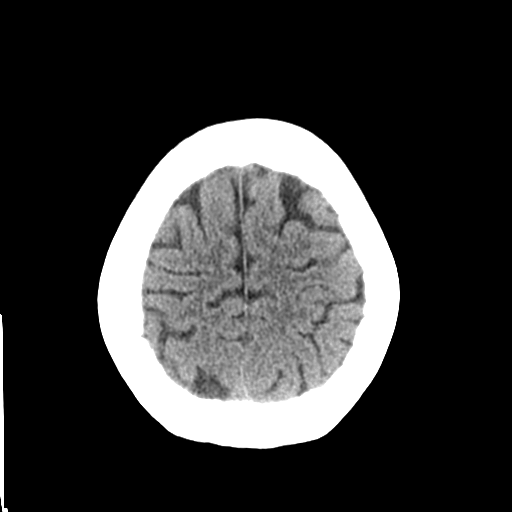
[im 25/31  brain]
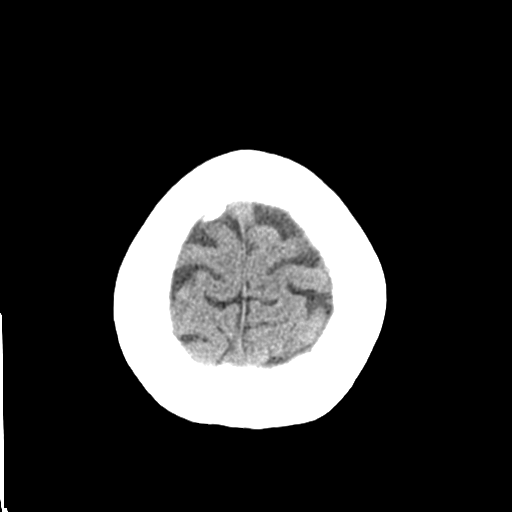
[im 25/31  bone]
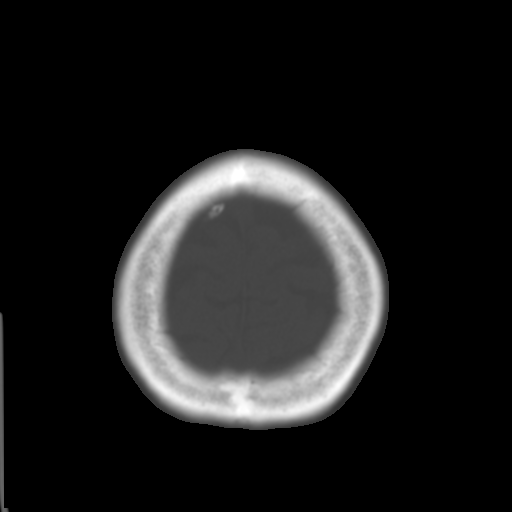
[im 28/31  brain]
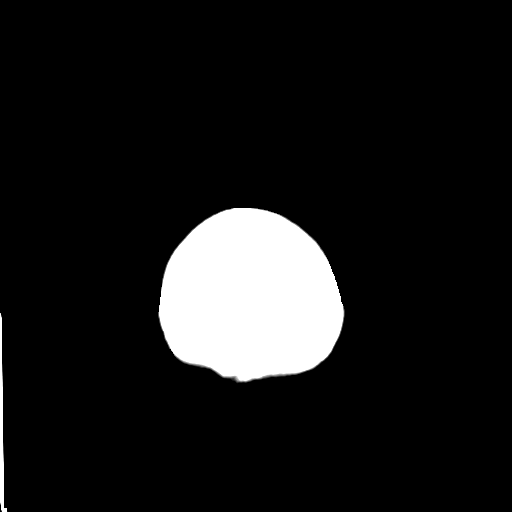

[Series 4: coronal soft tissue · coronal · 0.31mm/px · 3 of 67 slices shown]
[im 23/67  brain]
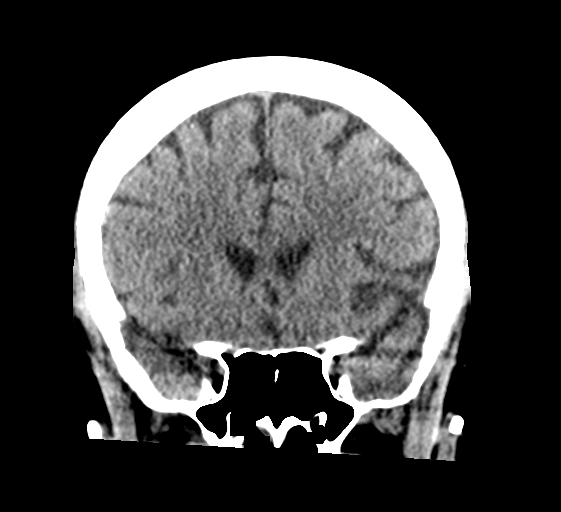
[im 30/67  brain]
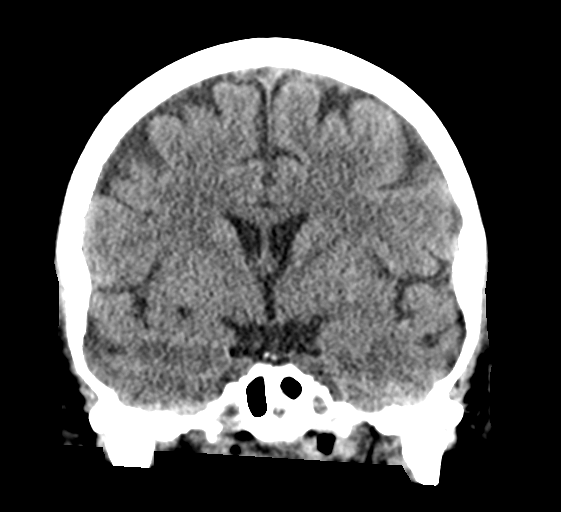
[im 37/67  brain]
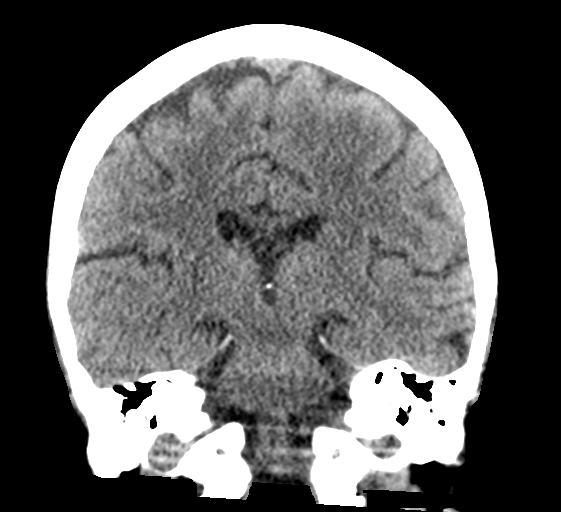

[Series 5: sagittal soft tissue · sagittal · 0.31mm/px · 3 of 52 slices shown]
[im 18/52  brain]
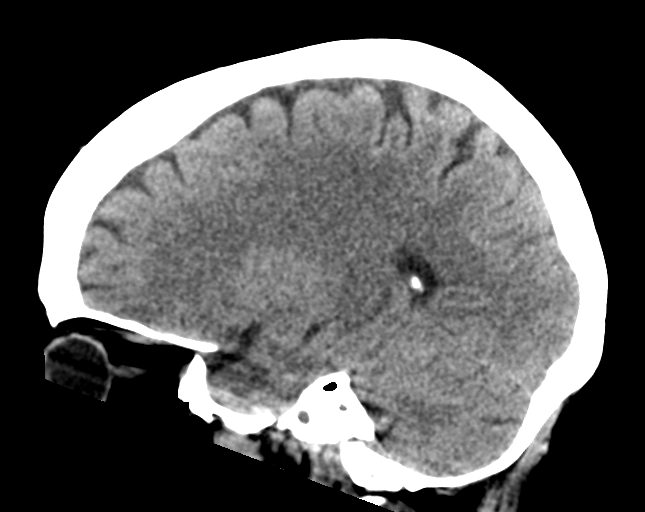
[im 26/52  brain]
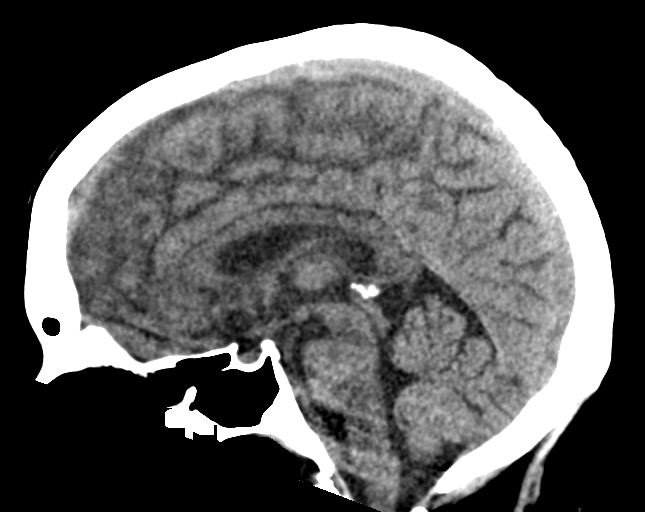
[im 35/52  brain]
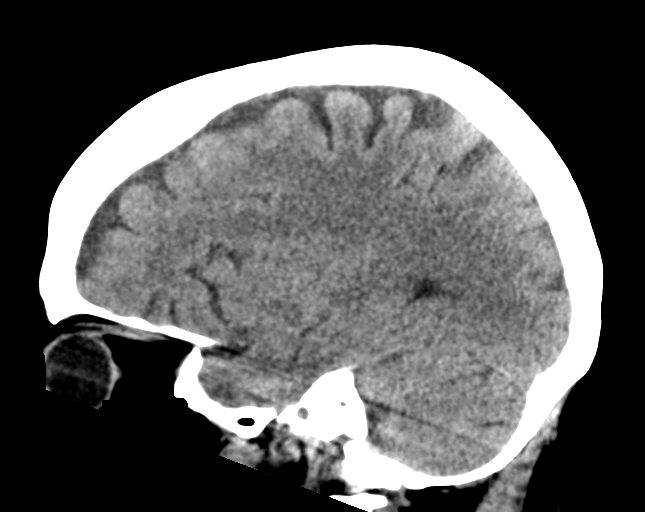

[16 of 47 positions shown; findings below may reference images not displayed]

FINDINGS: Brain: There is no acute intracranial hemorrhage, mass effect, or
edema. Gray-white differentiation is preserved. There is no
extra-axial fluid collection. Ventricles and sulci are within normal
limits in size and configuration.

Vascular: No hyperdense vessel or unexpected calcification.

Skull: Calvarium is unremarkable.

Sinuses/Orbits: No acute finding.

Other: None.
IMPRESSION: No acute intracranial abnormality.

## 2019-10-14 MED ORDER — FOLIC ACID 1 MG PO TABS
1.0000 mg | ORAL_TABLET | Freq: Every day | ORAL | Status: DC
  Administered 2019-10-15 – 2019-10-17 (×3): 1 mg via ORAL
  Filled 2019-10-14 (×3): qty 1

## 2019-10-14 MED ORDER — LORAZEPAM 2 MG/ML IJ SOLN: 1.0000 mg | INTRAMUSCULAR | Status: AC | PRN

## 2019-10-14 MED ORDER — GABAPENTIN 100 MG PO CAPS
100.0000 mg | ORAL_CAPSULE | Freq: Three times a day (TID) | ORAL | Status: DC
  Administered 2019-10-15 – 2019-10-17 (×7): 100 mg via ORAL
  Filled 2019-10-14 (×8): qty 1

## 2019-10-14 MED ORDER — POTASSIUM PHOSPHATES 15 MMOLE/5ML IV SOLN
30.0000 mmol | Freq: Once | INTRAVENOUS | Status: AC
  Administered 2019-10-15: 30 mmol via INTRAVENOUS
  Filled 2019-10-14: qty 10

## 2019-10-14 MED ORDER — POTASSIUM CHLORIDE 10 MEQ/100ML IV SOLN
10.0000 meq | INTRAVENOUS | Status: DC
  Administered 2019-10-14: 10 meq via INTRAVENOUS
  Filled 2019-10-14 (×2): qty 100

## 2019-10-14 MED ORDER — POTASSIUM CHLORIDE CRYS ER 20 MEQ PO TBCR
40.0000 meq | EXTENDED_RELEASE_TABLET | ORAL | Status: AC
  Administered 2019-10-14 – 2019-10-15 (×2): 40 meq via ORAL
  Filled 2019-10-14 (×2): qty 2

## 2019-10-14 MED ORDER — THIAMINE HCL 100 MG PO TABS
100.0000 mg | ORAL_TABLET | Freq: Every day | ORAL | Status: DC
  Administered 2019-10-15 – 2019-10-17 (×3): 100 mg via ORAL
  Filled 2019-10-14 (×3): qty 1

## 2019-10-14 MED ORDER — PANTOPRAZOLE SODIUM 40 MG IV SOLR
40.0000 mg | Freq: Two times a day (BID) | INTRAVENOUS | Status: DC
  Administered 2019-10-14 – 2019-10-17 (×7): 40 mg via INTRAVENOUS
  Filled 2019-10-14 (×7): qty 40

## 2019-10-14 MED ORDER — ADULT MULTIVITAMIN W/MINERALS CH
1.0000 | ORAL_TABLET | Freq: Every day | ORAL | Status: DC
  Administered 2019-10-15 – 2019-10-17 (×3): 1 via ORAL
  Filled 2019-10-14 (×3): qty 1

## 2019-10-14 MED ORDER — MAGNESIUM SULFATE 2 GM/50ML IV SOLN
2.0000 g | Freq: Once | INTRAVENOUS | Status: AC
  Administered 2019-10-14: 2 g via INTRAVENOUS
  Filled 2019-10-14: qty 50

## 2019-10-14 MED ORDER — POTASSIUM CHLORIDE CRYS ER 20 MEQ PO TBCR
40.0000 meq | EXTENDED_RELEASE_TABLET | Freq: Once | ORAL | Status: AC
  Administered 2019-10-14: 40 meq via ORAL
  Filled 2019-10-14: qty 2

## 2019-10-14 MED ORDER — GABAPENTIN 300 MG PO CAPS: 300.0000 mg | ORAL_CAPSULE | Freq: Every day | ORAL | Status: DC

## 2019-10-14 MED ORDER — ENSURE ENLIVE PO LIQD
237.0000 mL | Freq: Two times a day (BID) | ORAL | Status: DC
  Administered 2019-10-16 – 2019-10-17 (×2): 237 mL via ORAL

## 2019-10-14 MED ORDER — THIAMINE HCL 100 MG/ML IJ SOLN
100.0000 mg | Freq: Every day | INTRAMUSCULAR | Status: DC
  Filled 2019-10-14: qty 2

## 2019-10-14 MED ORDER — POTASSIUM CHLORIDE 10 MEQ/100ML IV SOLN
10.0000 meq | INTRAVENOUS | Status: AC
  Administered 2019-10-14 (×4): 10 meq via INTRAVENOUS
  Filled 2019-10-14 (×4): qty 100

## 2019-10-14 MED ORDER — K PHOS MONO-SOD PHOS DI & MONO 155-852-130 MG PO TABS
500.0000 mg | ORAL_TABLET | Freq: Four times a day (QID) | ORAL | Status: DC
  Administered 2019-10-14 – 2019-10-15 (×2): 500 mg via ORAL
  Filled 2019-10-14 (×6): qty 2

## 2019-10-14 MED ORDER — SODIUM CHLORIDE 0.9 % IV SOLN
INTRAVENOUS | Status: DC | PRN
  Administered 2019-10-14: 250 mL via INTRAVENOUS

## 2019-10-14 MED ORDER — POTASSIUM CHLORIDE CRYS ER 20 MEQ PO TBCR
40.0000 meq | EXTENDED_RELEASE_TABLET | Freq: Two times a day (BID) | ORAL | Status: DC
  Administered 2019-10-14: 40 meq via ORAL
  Filled 2019-10-14: qty 2

## 2019-10-14 MED ORDER — LORAZEPAM 1 MG PO TABS: 1.0000 mg | ORAL_TABLET | ORAL | Status: AC | PRN

## 2019-10-14 NOTE — Progress Notes (Signed)
PT Cancellation Note  Patient Details Name: Ashlee Hawkins MRN: [id number] DOB: 06-25-1980   Cancelled Treatment:    Reason Eval/Treat Not Completed: Medical issues which prohibited therapy Pt continues to have low potassium, most recent lab values in the mid 2.  Per report from rounding MD requesting K+ to be >3.0 before PT.  In speaking with nursing she states that per recent admissions she has tended to stay and low and expects her to need to be a hold this date.  Will attempt to eval pt tomorrow should she be appropriate at that time.    Malachi Pro, DPT 10/14/2019, 2:09 PM

## 2019-10-14 NOTE — Progress Notes (Signed)
Initial Nutrition Assessment  DOCUMENTATION CODES:   Obesity unspecified  INTERVENTION:  Ensure Enlive po BID, each supplement provides 350 kcal and 20 grams of protein (vanilla or strawberry)  Continue MVI with minerals, folic acid, thiamine daily  Pt refeeding, continue to monitor magnesium, potassium, and phosphorus daily until stable  NUTRITION DIAGNOSIS:   Inadequate oral intake related to social / environmental circumstances (EtOH abuse) as evidenced by per patient/family report.  GOAL:   Patient will meet greater than or equal to 90% of their needs   MONITOR:   PO intake, Weight trends, Supplement acceptance, I & O's, Skin, Labs  REASON FOR ASSESSMENT:   Malnutrition Screening Tool    ASSESSMENT:  39 year old female with history of bipolar disorder, alcoholism, HTN, chronic liver disease, PTSD, and seizures presented with acute onset of recurrent nausea and vomiting for which she was being seen at [facility] when she had seizure episode. Additional seizures noted while in ED and admitted with symptomatic hyponatremia and hypochloremia likely hypovolemic secondary to recurrent nausea and vomiting.  Met with pt bedside this morning, she reports feeling sleepy after eating breakfast, she is a poor historian at times, losing track of her thoughts, and slowed speech. Recalls hot tea, orange juice, all of her scrambled eggs, half of grits and sausage, and a few potatoes. Patient stated that was the most she had eaten in a long time. At home pt recalls drinking a shot of rum every hour and usually eating. Patient reports fluctuating weight history, recalls usually weighing around 140 lbs when she is not drinking. Per chart, weights have been stable over the past 14 months. Patient presented with severe hypokalemia on admission, remains at 2.5 today, continuing repletion, magnesium improved s/p repletion, noted due to juandice, phosphors level unable to be reported. Patient will  refeed, continue to monitor labs daily until stable.   Medications reviewed and include: Folic acid, Lactulose, Melatonin, MVI, Oxcarbazepine, Protonix, Klor-con, Rifaximin, Zoloft, Thiamine, IV Keppra  IVF: NaCl with KCl 40 mEq/L @ 75 ml/hr  Labs: Na 134 (L), K 2.5 (L) replacing, BUN 5 (L), Cr <0.30 (L), Ammonia 53 (H), Hgb 7.2 (L), HCT 21.4 (L)   NUTRITION - FOCUSED PHYSICAL EXAM:    Most Recent Value  Orbital Region Mild depletion  Upper Arm Region No depletion  Thoracic and Lumbar Region No depletion  Buccal Region No depletion  Temple Region Mild depletion  Clavicle Bone Region No depletion  Clavicle and Acromion Bone Region No depletion  Scapular Bone Region Unable to assess  Dorsal Hand No depletion  Patellar Region No depletion  Anterior Thigh Region No depletion  Posterior Calf Region No depletion  Edema (RD Assessment) None  Hair Reviewed  Eyes Reviewed  Mouth Reviewed  Skin Reviewed  [juandice]  Nails Unable to assess  [painted red]       Diet Order:   Diet Order            Diet Heart Room service appropriate? Yes; Fluid consistency: Thin  Diet effective now                 EDUCATION NEEDS:   Not appropriate for education at this time  Skin:  Skin Assessment: Reviewed RN Assessment (juandice)  Last BM:  9/9-type 7  Height:   Ht Readings from Last 1 Encounters:  10/12/19 5' 4"  (1.626 m)    Weight:   Wt Readings from Last 1 Encounters:  10/12/19 83.9 kg    Ideal Body  Weight:     BMI:  Body mass index is 31.76 kg/m.  Estimated Nutritional Needs:   Kcal:  1800-2000  Protein:  90-100  Fluid:  >/= 2 L

## 2019-10-14 NOTE — Progress Notes (Signed)
Ashlee Repress, MD [address]  [address]  [place], [place] [address]  Main: [phone]  Fax: [phone] Pager: [phone]   Subjective: Patient is much more coherent, alert today.  She is tolerating diet well.  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  She has been drinking alcohol socially when she was active [employer] but heavily for last 20 years.  She says she has a lot of personal stressors in her life.  She lives with her husband and her son and not working, stays at home   Objective: Vital signs in last 24 hours: Vitals:   10/13/19 2358 10/14/19 0424 10/14/19 0807 10/14/19 1147  BP: 107/70 108/76 112/82 115/82  Pulse: 91 89 90 95  Resp: 19 17 16 18   Temp: 98.9 F (37.2 C) 98.6 F (37 C) 97.9 F (36.6 C) 98.1 F (36.7 C)  TempSrc: Oral Oral Oral Oral  SpO2: 98% 96% 99% 93%  Weight:      Height:       Weight change:   Intake/Output Summary (Last 24 hours) at 10/14/2019 1528 Last data filed at 10/14/2019 1300 Gross per 24 hour  Intake 3644.43 ml  Output --  Net 3644.43 ml     Exam: Heart:: Regular rate and rhythm, S1S2 present or without murmur or extra heart sounds Lungs: normal and clear to auscultation Abdomen: soft, nontender, normal bowel sounds   Lab Results: CBC Latest Ref Rng & Units 10/14/2019 10/14/2019 10/13/2019  WBC 4.0 - 10.5 K/uL 10.0 9.0 7.7  Hemoglobin 12.0 - 15.0 g/dL 12/13/2019) 7.2(L) 7.0(L)  Hematocrit 36 - 46 % 24.9(L) 21.4(L) 21.1(L)  Platelets 150 - 400 K/uL 241 209 188    Micro Results: Recent Results (from the past 240 hour(s))  SARS Coronavirus 2 by RT PCR (hospital order, performed in [facility] hospital lab) Nasopharyngeal Nasopharyngeal Swab     Status: None   Collection Time: 10/12/19  6:08 PM   Specimen: Nasopharyngeal Swab  Result Value Ref Range Status   SARS Coronavirus 2 NEGATIVE NEGATIVE Final    Comment: (NOTE) SARS-CoV-2 target nucleic acids are NOT  DETECTED.  The SARS-CoV-2 RNA is generally detectable in upper and lower respiratory specimens during the acute phase of infection. The lowest concentration of SARS-CoV-2 viral copies this assay can detect is 250 copies / mL. A negative result does not preclude SARS-CoV-2 infection and should not be used as the sole basis for treatment or other patient management decisions.  A negative result may occur with improper specimen collection / handling, submission of specimen other than nasopharyngeal swab, presence of viral mutation(s) within the areas targeted by this assay, and inadequate number of viral copies (<250 copies / mL). A negative result must be combined with clinical observations, patient history, and epidemiological information.  Fact Sheet for Patients:   12/12/19  Fact Sheet for Healthcare Providers: BoilerBrush.com.cy  This test is not yet approved or  cleared by the https://pope.com/ FDA and has been authorized for detection and/or diagnosis of SARS-CoV-2 by FDA under an Emergency Use Authorization (EUA).  This EUA will remain in effect (meaning this test can be used) for the duration of the COVID-19 declaration under Section 564(b)(1) of the Act, 21 U.S.C. section 360bbb-3(b)(1), unless the authorization is terminated or revoked sooner.  Performed at [facility], [address], [place], [place]    Studies/Results: EEG  Result Date: 10/13/2019 12/13/2019,  Ashlee OppenheimPriyanka O, MD     10/13/2019  3:46 PM Patient Name: Ashlee Hawkins MRN: [id number] Epilepsy Attending: Charlsie QuestPriyanka Hawkins Hawkins Referring Physician/Provider: Dr Valente DavidJan Hawkins Date: 10/13/2019 Duration: 20.33 mins Patient history: 39  year old female with breakthrough seizures secondary to inability to take PO anticonvulsant at home as well as hyponatremia and hypomagnesemia in the setting of a one week history of N/V. EEG to evaluate for seizure. Level of  alertness: Awake AEDs during EEG study: OXC, LEV Technical aspects: This EEG study was done with scalp electrodes positioned according to the 10-20 International system of electrode placement. Electrical activity was acquired at a sampling rate of 500Hz  and reviewed with a high frequency filter of 70Hz  and a low frequency filter of 1Hz . EEG data were recorded continuously and digitally stored. Description: The posterior dominant rhythm consists of 8-9 Hz activity of moderate voltage (25-35 uV) seen predominantly in posterior head regions, symmetric and reactive to eye opening and eye closing. EEG showed intermittent generalized 3 to 6 Hz theta-delta slowing. Physiologic photic driving was not seen during photic stimulation.  Hyperventilation was not performed.   ABNORMALITY -Intermittent slow, generalized IMPRESSION: This study is suggestive of mild diffuse encephalopathy, nonspecific etiology.  No seizures or epileptiform discharges were seen throughout the recording. Charlsie QuestPriyanka Hawkins Hawkins   CT HEAD WO CONTRAST  Result Date: 10/14/2019 CLINICAL DATA:  Seizure, follow-up EXAM: CT HEAD WITHOUT CONTRAST TECHNIQUE: Contiguous axial images were obtained from the base of the skull through the vertex without intravenous contrast. COMPARISON:  06/02/2019 FINDINGS: Brain: There is no acute intracranial hemorrhage, mass effect, or edema. Gray-white differentiation is preserved. There is no extra-axial fluid collection. Ventricles and sulci are within normal limits in size and configuration. Vascular: No hyperdense vessel or unexpected calcification. Skull: Calvarium is unremarkable. Sinuses/Orbits: No acute finding. Other: None. IMPRESSION: No acute intracranial abnormality. Electronically Signed   By: Ashlee SpanishPraneil  Patel M.D.   On: 10/14/2019 10:52   MR BRAIN WO CONTRAST  Result Date: 10/13/2019 CLINICAL DATA:  Initial evaluation for acute seizure. EXAM: MRI HEAD WITHOUT CONTRAST TECHNIQUE: Multiplanar, multiecho pulse  sequences of the brain and surrounding structures were obtained without intravenous contrast. COMPARISON:  Prior CT from 06/02/2019. FINDINGS: Brain: Cerebral volume within normal limits. Few scattered foci of subcentimeter T2/FLAIR hyperintensity noted within the supratentorial cerebral white matter, nonspecific, but felt to be within normal limits for age, and of doubtful significance in the acute setting. No abnormal foci of restricted diffusion to suggest acute or subacute ischemia or changes related to seizure. Gray-white matter differentiation maintained. No encephalomalacia to suggest chronic cortical infarction. No definite foci of susceptibility artifact to suggest acute or chronic intracranial hemorrhage. No mass lesion, midline shift or mass effect. No hydrocephalus or extra-axial fluid collection. Pituitary gland suprasellar region normal. Midline structures intact. Vascular: Major intracranial vascular flow voids are maintained. Skull and upper cervical spine: Craniocervical junction within normal limits. Bone marrow signal intensity normal. No scalp soft tissue abnormality. Sinuses/Orbits: Globes and orbital soft tissues demonstrate no acute finding. Mild scattered mucosal thickening noted within the ethmoidal air cells and maxillary sinuses. Small right greater than left mastoid effusions noted, of doubtful significance. Visualized nasopharynx within normal limits. Other: None. IMPRESSION: Normal brain MRI for age. No acute intracranial abnormality identified. Electronically Signed   By: Rise MuBenjamin  McClintock M.D.   On: 10/13/2019 03:10   US Abdomen Limited RUQ  Result Date: 10/12/2019 CLINICAL DATA:  Elevated LFTs EXAM: ULTRASOUND ABDOMEN LIMITED RIGHT UPPER QUADRANT COMPARISON:  None.  FINDINGS: Gallbladder: Prior cholecystectomy Common bile duct: Diameter: Normal caliber, 3 mm Liver: Heterogeneous echotexture compatible with fatty infiltration or intrinsic liver disease. Liver appears enlarged. No  focal hepatic abnormality. Portal vein is patent on color Doppler imaging with normal direction of blood flow towards the liver. Other: None. IMPRESSION: Enlarged liver with fatty infiltration or intrinsic liver disease. Electronically Signed   By: Charlett Nose M.D.   On: 10/12/2019 19:49   Medications:  I have reviewed the patient's current medications. Prior to Admission:  Medications Prior to Admission  Medication Sig Dispense Refill Last Dose  . amLODipine (NORVASC) 10 MG tablet Take 10 mg by mouth daily.   Past Week at Unknown time  . clotrimazole (LOTRIMIN) 1 % external solution Apply 1 application topically 2 (two) times daily.   Past Week at Unknown time  . diclofenac sodium (VOLTAREN) 1 % GEL Apply 2 g topically 4 (four) times daily as needed (pain).   Past Week at Unknown time  . diphenhydrAMINE (BENADRYL) 25 mg capsule Take 25 mg by mouth every 6 (six) hours as needed for itching or allergies.    prn at prn  . Docusate Sodium (DSS) 100 MG CAPS Take 100 mg by mouth daily.    Past Week at Unknown time  . hydrOXYzine (ATARAX/VISTARIL) 50 MG tablet Take 50 mg by mouth 3 (three) times daily as needed.   Past Week at prn  . ibuprofen (ADVIL) 600 MG tablet Take 600 mg by mouth every 6 (six) hours as needed.   Past Week at prn  . lisinopril (PRINIVIL,ZESTRIL) 20 MG tablet Take 20 mg by mouth daily.    Past Week at Unknown time  . Melatonin 3 MG TABS Take 3 mg by mouth at bedtime.   Past Week at Unknown time  . Multiple Vitamin (MULTI-VITAMIN) tablet Take 1 tablet by mouth daily.   Past Week at Unknown time  . nicotine polacrilex (COMMIT) 2 MG lozenge Place 2 mg inside cheek every 2 (two) hours as needed.    prn at prn  . omeprazole (PRILOSEC) 20 MG capsule Take 20 mg by mouth daily.    Past Week at Unknown time  . sertraline (ZOLOFT) 100 MG tablet Take 100 mg by mouth daily.    Past Week at Unknown time  . thiamine 100 MG tablet Take 100 mg by mouth daily.    Past Week at Unknown time  .  traZODone (DESYREL) 50 MG tablet Take 50 mg by mouth at bedtime.    Past Week at Unknown time  . zinc gluconate 50 MG tablet Take 50 mg by mouth daily.    Past Week at Unknown time  . naltrexone (DEPADE) 50 MG tablet Take 50 mg by mouth daily. (Patient not taking: Reported on 10/13/2019)   Not Taking at Unknown time  . ondansetron (ZOFRAN ODT) 4 MG disintegrating tablet Take 1 tablet (4 mg total) by mouth every 8 (eight) hours as needed for nausea or vomiting. (Patient not taking: Reported on 10/13/2019) 20 tablet 0 Not Taking at Unknown time  . oxcarbazepine (TRILEPTAL) 600 MG tablet Take 600 mg by mouth 2 (two) times daily. (Patient not taking: Reported on 10/13/2019)   Not Taking at Unknown time   Scheduled: . amLODipine  5 mg Oral Daily  . enoxaparin (LOVENOX) injection  40 mg Subcutaneous Q24H  . feeding supplement (ENSURE ENLIVE)  237 mL Oral BID BM  . folic acid  1 mg Oral Daily  . gabapentin  300  mg Oral QHS  . lisinopril  10 mg Oral Daily  . melatonin  5 mg Oral QHS  . multivitamin with minerals  1 tablet Oral Daily  . oxcarbazepine  600 mg Oral BID  . pantoprazole (PROTONIX) IV  40 mg Intravenous Q12H  . potassium chloride  40 mEq Oral BID  . potassium chloride  40 mEq Oral BID  . rifaximin  550 mg Oral BID  . sertraline  50 mg Oral Daily  . thiamine  100 mg Oral Daily   Or  . thiamine  100 mg Intravenous Daily   Continuous: . sodium chloride Stopped (10/14/19 0646)  . 0.9 % NaCl with KCl 40 mEq / L 75 mL/hr (10/14/19 1359)  . levETIRAcetam Stopped (10/14/19 0309)   YQI:HKVQQV chloride, cyclobenzaprine, hydrALAZINE, lactulose, LORazepam **OR** LORazepam, LORazepam, magnesium hydroxide, ondansetron **OR** ondansetron (ZOFRAN) IV, ondansetron Anti-infectives (From admission, onward)   Start     Dose/Rate Route Frequency Ordered Stop   10/13/19 2200  rifaximin (XIFAXAN) tablet 550 mg        550 mg Oral 2 times daily 10/13/19 1549       Scheduled Meds: . amLODipine  5 mg Oral  Daily  . enoxaparin (LOVENOX) injection  40 mg Subcutaneous Q24H  . feeding supplement (ENSURE ENLIVE)  237 mL Oral BID BM  . folic acid  1 mg Oral Daily  . gabapentin  300 mg Oral QHS  . lisinopril  10 mg Oral Daily  . melatonin  5 mg Oral QHS  . multivitamin with minerals  1 tablet Oral Daily  . oxcarbazepine  600 mg Oral BID  . pantoprazole (PROTONIX) IV  40 mg Intravenous Q12H  . potassium chloride  40 mEq Oral BID  . potassium chloride  40 mEq Oral BID  . rifaximin  550 mg Oral BID  . sertraline  50 mg Oral Daily  . thiamine  100 mg Oral Daily   Or  . thiamine  100 mg Intravenous Daily   Continuous Infusions: . sodium chloride Stopped (10/14/19 0646)  . 0.9 % NaCl with KCl 40 mEq / L 75 mL/hr (10/14/19 1359)  . levETIRAcetam Stopped (10/14/19 0309)   PRN Meds:.sodium chloride, cyclobenzaprine, hydrALAZINE, lactulose, LORazepam **OR** LORazepam, LORazepam, magnesium hydroxide, ondansetron **OR** ondansetron (ZOFRAN) IV, ondansetron   Assessment: Active Problems:   Alcoholic hepatitis   Hyponatremia  Plan: Acute alcoholic hepatitis DF score is less than 32, does not meet criteria for prednisolone Monitor LFTs daily Avoid hepatotoxic agents Continue rifaximin 550 mg twice daily and lactulose as needed to treat hepatic encephalopathy Correct electrolyte abnormalities aggressively, patient has severe hypokalemia CIWA protocol Continue multivitamin plus thiamine plus folate Recommend to start gabapentin 100 mg 3 times daily for alcohol dependence and prevent withdrawal Protein supplements, encourage high-protein diet Discussed with her about consequences of alcohol abuse and advised abstinence from alcohol use Patient should follow-up with GI as outpatient  GI will sign off at this time, please call us back with questions or concerns   LOS: 2 days   Jocie Meroney 10/14/2019, 3:28 PM

## 2019-10-14 NOTE — Progress Notes (Signed)
PT Cancellation Note  Patient Details Name: Ashlee Hawkins MRN: [id number] DOB: 1980-10-14   Cancelled Treatment:    Reason Eval/Treat Not Completed: Medical issues which prohibited therapy Pt still with critically low K+ levels as well as Hgb in the low 7s.  Spoke with nursing who reports she may be ready this afternoon and to check back at that time.  Will hold PT eval this AM.   Malachi Pro, DPT 10/14/2019, 9:50 AM

## 2019-10-14 NOTE — Progress Notes (Addendum)
PROGRESS NOTE    Ashlee Hawkins  XKG:[id number] DOB: Feb 24, 1980 DOA: 10/12/2019 PCP: [facility] [place]   Brief Narrative:  HPI per Dr. Eugenie Norrie on 10/12/19 Ashlee Hawkins  is a 39 y.o. Caucasian female with a known history of bipolar 1 disorder, alcoholism, hypertension, chronic liver disease, posttraumatic stress disorder and seizures, who presented to the emergency room with acute onset of recurrent nausea and vomiting for which she was seen today at [facility] and had a seizure episode they are in a couple of episodes in the ER.  She has been having dizziness as well as lightheadedness and epigastric and right upper quadrant abdominal discomfort with occasional loose bowel movements.  She denied any cough or wheezing or dyspnea.  She has been fairly somnolent but easily arousable.  During my interview she was very slow to respond to questions.  Last alcoholic drink was yesterday per her report.  Her father was with her in the room but states that he usually is out for work. No dysuria, oliguria or hematuria or flank pain.  Upon presentation to the emergency room, blood pressure was 168/99 with a heart rate of 53 and respiratory to 22.  Labs revealed hyponatremia 119 hypochloremia of less than 65 and hypokalemia less than 2 with CO2 of 20 and glucose of 2 3, calcium 7.8 with albumin of 3.2.  AST was 338 and ALT 79 with alk phos of 223.  Magnesium was low at 1.2 ammonia levels high at 57 and total bili was significantly high at 12.7.  CBC showed mild cytosis of 10.7 and anemia with hemoglobin 9.8 hematocrit of 27.7 compared to 16.2 and 46.6 on 08/05/2018 and to 12.9/38.2 on 01/14/2018.  Tylenol levels less than 10, PT 13.9 INR 1.1 with PTT of 30. Twelve-lead EKG showed sinus tachycardia with rate 110 with PVCs in a pattern of bigeminy and nonspecific T wave abnormalities.  The patient was given 2 g of IV magnesium sulfate, 10 mEq of IV potassium chloride and 40 mEq p.o., 1 L bolus of IV  normal saline and a banana bag 150 mL/h.  She will be admitted to medical monitored bed for further evaluation and management.  **Interim History He has alcoholic hepatitis and had symptomatic hyponatremia which is improved.  She is feeling much better today.  Repeat sodium is now 135.  LFTs are still elevated and T bili still elevated as well.  Is signed off and will continue to monitor her given her seizures.  GI recommending rifaximin twice daily and lactulose as needed to treat hepatic encephalopathy.  They have also recommended starting gabapentin 100 mg 3 times daily for alcohol dependence and preventing withdrawal symptoms.  Dr. Marius Ditch recommends continue monitor LFTs daily  Assessment & Plan:   Active Problems:   Alcoholic hepatitis   Hyponatremia  Symptomatic hyponatremia and hypochloremia, likely hypovolemic secondary to recurrent nausea and vomiting.  It could be related to alcoholic Potomania. -The patient will be admitted to a medically monitored bed. -Patient sodium on admission was 119 and chloride level was less than 65 -She will be hydrated with IV normal saline and this is being continued with normal saline +40 mEq of KCl at 75 mL's per hour.  We will follow serial BMPs. -Sodium is now improved from 119 and has trended up to 134; chloride level is now 89 -Repeat CMP this evening is pending; continue to follow serial BMPs and CMP -TSH was 0.742 -Serum osmolality was 259  Acute Hepatitis likely  alcoholic with associated hepatic encephalopathy and abnormal LFTs. Hyperbilirubinemia -We will follow LFTs with hydration. -AST went from 338 and now 337 -ALT went from 79 is now 78 -Should be placed on a banana bag daily. -We will obtain acute viral hepatitis panel which was negative -We will place her on scheduled lactulose and follow daily ammonia level.  Ammonia level was elevated at 57 and repeat was 53 yesterday morning and repeat ammonia level in a.m. -[name] moderate  discriminant function score was 10.9 and did not meet criteria for prednisolone -GI recommends starting gabapentin 100 g 2-3 times daily for alcohol withdrawals and starting rifaximin 550 mg p.o. twice daily along with lactulose 30 g 2 times daily -[name] T bili went from 12.7 and trended up to 16.7 and trended down to 14.6 but is now 15.8 and morning -GI consultation appreciate Dr. Verlin Grills evaluation; patient does not qualify for prednisolone given her discriminant function score being less than 32 -GI recommends continue monitoring and monitoring LFTs daily  Recurrent seizures.  This could be related to alcohol withdrawal. -We will obtain an alcohol level. -We will place on seizure precautions and as needed IV Ativan. -We will continue her Trileptal however she was unable to safely take p.o. so he was started on a Keppra load given IV Keppra 1500 mg x 2 and Neurology will recommend continuing IV 500 mg every 12 hours; OF note last time the patient refill Trileptal was back in 2020 November -Obtain a brain MRI without contrast specially given her slurred speech to rule out CVA; the MRI of the brain showed "Normal brain MRI for age. No acute intracranial abnormality identified." -Neurology was consulted and Dr. Cheral Marker and because the patient was unable to take p.o. yesterday and failed her swallow evaluation he recommends continuing Keppra 500 mg twice daily and when she is able to take p.o. restarting the Trileptal and overlapping the Keppra by 2 days and then stopping the Keppra -EEG was done and was suggestive of mild diffuse encephalopathy nonspecific to etiology but no seizures or epileptiform discharges seen throughout this recording -Dr. Cheral Marker also recommends discontinuing ongoing blood total daily dose of her Ultram as it can lower the seizure threshold -Continue with inpatient seizure precautions and CIWA protocol  Hypomagnesemia -[name] magnesium level on admission was 1.2 and  after repletion is improved to 2.2 but today was 1.8 so we replete again -continue monitor and replete as necessary -Repeat Mag level in a.m.  Hypokalemia -Significant and very severe -On admission patient's potassium was less than 2.0 and on repeat was less than 2.0.  Repeat this morning showed a potassium of 2.1 and this was repeated later in the afternoon was 2.5 -Continue potassium repletion; currently getting normal saline with 40 mg of KCl and reduce the rate from 125 MLS per hour to 75 MLS per hour and replete with p.o. potassium chloride 40 mg twice daily x2 doses -Continue to monitor and replete as necessary -Repeat CMP in a.m.  Macrocytic Anemia likely in the setting of alcoholism -[name] hemoglobin/hematocrit went from 9.8/27.7 and trended down to 7.0/21.1 and is now 8.6/24.9 -Check anemia panel and showed an iron level 197, U IBC of 0, TIBC 197, saturation ratios 100%, ferritin level 4306, folate level 15.4, and vitamin B12 level of 598 -We will obtain anemia work-up including stool Hemoccult. -Continue to monitor for signs and symptoms of bleeding; currently no overt bleeding noted Repeat CBC in a.m.  Essential Hypertension. -Continue with amlodipine 5 mg p.o. daily  as well as lisinopril 10 mg p.o. daily X-continue IV hydralazine 10 mg every 6 as needed for systolic blood pressure greater than 196 or diastolic blood pressure greater than 110 -Last BP was 115/82 -Continue to Monitor BP per protocol   Depression. -We will continue her antidepressants if able to take p.o. with sertraline 50 mg p.o. daily  Hypocalcemia -Paitent's Ca2+ went from 7.6 -> 7.0 -> 7.8; Corrected for Albumin was 8.1 -Will give 1 gram of Calcium Gluconate  Obesity -Estimated body mass index is 31.76 kg/m as calculated from the following:   Height as of this encounter: 5' 4"  (1.626 m).   Weight as of this encounter: 83.9 kg. -Weight Loss and Dietary Counseling  -Dietitian was consulted and  recommending Ensure Enlive p.o. twice daily as well continue multivitamin with minerals, folic acid, and thiamine daily  DVT prophylaxis: Enoxaparin 40 mg sq q24h Code Status: FULL CODE  Family Communication: No family present at bedside  Disposition Plan: Pending further clinical improvement back to baseline and correction of electrolytes; anticipating discharge in next 48 hours if her electrolytes are improved  Status is: Inpatient  Remains inpatient appropriate because:Persistent severe electrolyte disturbances, Altered mental status, Unsafe d/c plan, IV treatments appropriate due to intensity of illness or inability to take PO and Inpatient level of care appropriate due to severity of illness   Dispo: The patient is from: Home              Anticipated d/c is to: TBD              Anticipated d/c date is: 2 days              Patient currently is not medically stable to d/c.  Consultants:   Neurology  Gastroenterology   Procedures:  EEG This study is suggestive of mild diffuse encephalopathy, nonspecific etiology.  No seizures or epileptiform discharges were seen throughout the recording.  Antimicrobials:  Anti-infectives (From admission, onward)   Start     Dose/Rate Route Frequency Ordered Stop   10/13/19 2200  rifaximin (XIFAXAN) tablet 550 mg        550 mg Oral 2 times daily 10/13/19 1549          Subjective: Seen and examined and was much more awake and coherent today.  She denies any nausea but continues have some a little abdominal pain but not as bad.  No chest pain, lightheadedness or dizziness.  Tolerating her diet without issues.  Feels much better and denies any other complaints or concerns at this time but states that she did not sleep very well last night.  Objective: Vitals:   10/13/19 2358 10/14/19 0424 10/14/19 0807 10/14/19 1147  BP: 107/70 108/76 112/82 115/82  Pulse: 91 89 90 95  Resp: 19 17 16 18   Temp: 98.9 F (37.2 C) 98.6 F (37 C) 97.9 F  (36.6 C) 98.1 F (36.7 C)  TempSrc: Oral Oral Oral Oral  SpO2: 98% 96% 99% 93%  Weight:      Height:        Intake/Output Summary (Last 24 hours) at 10/14/2019 1705 Last data filed at 10/14/2019 1300 Gross per 24 hour  Intake 3544.43 ml  Output --  Net 3544.43 ml   Filed Weights   10/12/19 1516  Weight: 83.9 kg   Examination: Physical Exam:  Constitutional: WN/WD obese Caucasian female currently in no acute distress appears calm and more comfortable today. Eyes: Lids and conjunctivae normal, sclerae icteric ENMT: External  Ears, Nose appear normal. Grossly normal hearing Neck: Appears normal, supple, no cervical masses, normal ROM, no appreciable thyromegaly; no JVD Respiratory: Diminished to auscultation bilaterally, no wheezing, rales, rhonchi or crackles. Normal respiratory effort and patient is not tachypenic. No accessory muscle use.  Unlabored breathing Cardiovascular: RRR, no murmurs / rubs / gallops. S1 and S2 auscultated.  Trace extremity edema Abdomen: Soft, mildly-tender, distended secondary body habitus. Bowel sounds positive.  GU: Deferred. Musculoskeletal: No clubbing / cyanosis of digits/nails. No joint deformity upper and lower extremities. Skin: No rashes, lesions, ulcers to skin evaluation. No induration; Warm and dry.  Neurologic: CN 2-12 grossly intact with no focal deficits. Romberg sign and cerebellar reflexes not assessed.  Psychiatric: Normal judgment and insight. Alert and oriented x 3. Normal mood and appropriate affect.    Data Reviewed: I have personally reviewed following labs and imaging studies  CBC: Recent Labs  Lab 10/12/19 1521 10/13/19 0327 10/13/19 1900 10/14/19 0118 10/14/19 0816  WBC 10.7* 8.2 7.7 9.0 10.0  NEUTROABS 8.9*  --  5.6 5.9 6.5  HGB 9.8* 8.7* 7.0* 7.2* 8.6*  HCT 27.7* 24.8* 21.1* 21.4* 24.9*  MCV 100.7* 102.1* 107.1* 108.1* 106.4*  PLT 186 179 188 209 854   Basic Metabolic Panel: Recent Labs  Lab 10/12/19 1521  10/12/19 1630 10/13/19 0327 10/13/19 1155 10/13/19 1900 10/14/19 0118 10/14/19 0810 10/14/19 0816  NA   < >  --  130* 133* 133* 135 134*  --   K   < >  --  <2.0* 2.1* 2.7* 2.1* 2.5*  --   CL   < >  --  77* 84* 86* 88* 89*  --   CO2   < >  --  33* 35* 32 34* 31  --   GLUCOSE   < >  --  120* 117* 152* 99 92  --   BUN   < >  --  <5* <5* 5* 6 5*  --   CREATININE   < >  --  0.42* <0.30* 0.32* <0.30* <0.30*  --   CALCIUM   < >  --  7.6* 7.0* 7.0* 7.8* 7.8*  --   MG  --  1.2*  --  2.2  --  1.9  --  1.8  PHOS  --   --   --  UNABLE TO REPORT DUE TO ICTERUS UNABLE TO REPORT DUE TO ICTERUS UNABLE TO REPORT DUE TO ICTERUS  --  UNABLE TO REPORT DUE TO ICTERUS   < > = values in this interval not displayed.   GFR: CrCl cannot be calculated (This lab value cannot be used to calculate CrCl because it is not a number: <0.30). Liver Function Tests: Recent Labs  Lab 10/13/19 0327 10/13/19 1155 10/13/19 1900 10/14/19 0118 10/14/19 0810  AST 268* 286* 303* 316* 337*  ALT 71* 60* 62* 71* 78*  ALKPHOS 181* 161* 152* 176* 205*  BILITOT 16.7* 15.5* 14.6* 14.6* 15.8*  PROT 6.3* 5.4* 5.4* 5.9* 6.4*  ALBUMIN 2.9* 2.6* 2.4* 2.7* 2.9*   No results for input(s): LIPASE, AMYLASE in the last 168 hours. Recent Labs  Lab 10/12/19 1630 10/13/19 0327 10/14/19 0118  AMMONIA 57* 53* 53*   Coagulation Profile: Recent Labs  Lab 10/12/19 1630 10/12/19 2224  INR 1.1 1.1   Cardiac Enzymes: No results for input(s): CKTOTAL, CKMB, CKMBINDEX, TROPONINI in the last 168 hours. BNP (last 3 results) No results for input(s): PROBNP in the last 8760 hours. HbA1C: No results for  input(s): HGBA1C in the last 72 hours. CBG: Recent Labs  Lab 10/13/19 1200 10/13/19 1859 10/14/19 0656 10/14/19 0805 10/14/19 1144  GLUCAP 123* 159* 105* 95 104*   Lipid Profile: No results for input(s): CHOL, HDL, LDLCALC, TRIG, CHOLHDL, LDLDIRECT in the last 72 hours. Thyroid Function Tests: Recent Labs    10/12/19 2224    TSH 0.742   Anemia Panel: Recent Labs    10/12/19 2224 10/13/19 0327  VITAMINB12 598  --   FOLATE 15.4  --   FERRITIN 4,306*  --   TIBC 197*  --   IRON 197*  --   RETICCTPCT  --  1.0   Sepsis Labs: No results for input(s): PROCALCITON, LATICACIDVEN in the last 168 hours.  Recent Results (from the past 240 hour(s))  SARS Coronavirus 2 by RT PCR (hospital order, performed in [facility] hospital lab) Nasopharyngeal Nasopharyngeal Swab     Status: None   Collection Time: 10/12/19  6:08 PM   Specimen: Nasopharyngeal Swab  Result Value Ref Range Status   SARS Coronavirus 2 NEGATIVE NEGATIVE Final    Comment: (NOTE) SARS-CoV-2 target nucleic acids are NOT DETECTED.  The SARS-CoV-2 RNA is generally detectable in upper and lower respiratory specimens during the acute phase of infection. The lowest concentration of SARS-CoV-2 viral copies this assay can detect is 250 copies / mL. A negative result does not preclude SARS-CoV-2 infection and should not be used as the sole basis for treatment or other patient management decisions.  A negative result may occur with improper specimen collection / handling, submission of specimen other than nasopharyngeal swab, presence of viral mutation(s) within the areas targeted by this assay, and inadequate number of viral copies (<250 copies / mL). A negative result must be combined with clinical observations, patient history, and epidemiological information.  Fact Sheet for Patients:   StrictlyIdeas.no  Fact Sheet for Healthcare Providers: BankingDealers.co.za  This test is not yet approved or  cleared by the [place] FDA and has been authorized for detection and/or diagnosis of SARS-CoV-2 by FDA under an Emergency Use Authorization (EUA).  This EUA will remain in effect (meaning this test can be used) for the duration of the COVID-19 declaration under Section 564(b)(1) of the Act, 21  U.S.C. section 360bbb-3(b)(1), unless the authorization is terminated or revoked sooner.  Performed at [facility], [address] [place], [place] [address]     RN Pressure Injury Documentation:     Estimated body mass index is 31.76 kg/m as calculated from the following:   Height as of this encounter: 5' 4"  (1.626 m).   Weight as of this encounter: 83.9 kg.  Malnutrition Type:  Nutrition Problem: Inadequate oral intake Etiology: social / environmental circumstances (EtOH abuse)   Malnutrition Characteristics:  Signs/Symptoms: per patient/family report   Nutrition Interventions:  Interventions: Ensure Enlive (each supplement provides 350kcal and 20 grams of protein), MVI  Radiology Studies: EEG  Result Date: 10/13/2019 Ashlee Havens, MD     10/13/2019  3:46 PM Patient Name: Ashlee Hawkins MRN: [id number] Epilepsy Attending: Lora Hawkins Referring Physician/Provider: Dr Eugenie Norrie Date: 10/13/2019 Duration: 20.33 mins Patient history: 39  year old female with breakthrough seizures secondary to inability to take PO anticonvulsant at home as well as hyponatremia and hypomagnesemia in the setting of a one week history of N/V. EEG to evaluate for seizure. Level of alertness: Awake AEDs during EEG study: OXC, LEV Technical aspects: This EEG study was done with scalp electrodes  positioned according to the 10-20 International system of electrode placement. Electrical activity was acquired at a sampling rate of 500Hz  and reviewed with a high frequency filter of 70Hz  and a low frequency filter of 1Hz . EEG data were recorded continuously and digitally stored. Description: The posterior dominant rhythm consists of 8-9 Hz activity of moderate voltage (25-35 uV) seen predominantly in posterior head regions, symmetric and reactive to eye opening and eye closing. EEG showed intermittent generalized 3 to 6 Hz theta-delta slowing. Physiologic photic driving was not seen during photic  stimulation.  Hyperventilation was not performed.   ABNORMALITY -Intermittent slow, generalized IMPRESSION: This study is suggestive of mild diffuse encephalopathy, nonspecific etiology.  No seizures or epileptiform discharges were seen throughout the recording. Ashlee Hawkins   CT HEAD WO CONTRAST  Result Date: 10/14/2019 CLINICAL DATA:  Seizure, follow-up EXAM: CT HEAD WITHOUT CONTRAST TECHNIQUE: Contiguous axial images were obtained from the base of the skull through the vertex without intravenous contrast. COMPARISON:  06/02/2019 FINDINGS: Brain: There is no acute intracranial hemorrhage, mass effect, or edema. Gray-white differentiation is preserved. There is no extra-axial fluid collection. Ventricles and sulci are within normal limits in size and configuration. Vascular: No hyperdense vessel or unexpected calcification. Skull: Calvarium is unremarkable. Sinuses/Orbits: No acute finding. Other: None. IMPRESSION: No acute intracranial abnormality. Electronically Signed   By: Macy Mis M.D.   On: 10/14/2019 10:52   MR BRAIN WO CONTRAST  Result Date: 10/13/2019 CLINICAL DATA:  Initial evaluation for acute seizure. EXAM: MRI HEAD WITHOUT CONTRAST TECHNIQUE: Multiplanar, multiecho pulse sequences of the brain and surrounding structures were obtained without intravenous contrast. COMPARISON:  Prior CT from 06/02/2019. FINDINGS: Brain: Cerebral volume within normal limits. Few scattered foci of subcentimeter T2/FLAIR hyperintensity noted within the supratentorial cerebral white matter, nonspecific, but felt to be within normal limits for age, and of doubtful significance in the acute setting. No abnormal foci of restricted diffusion to suggest acute or subacute ischemia or changes related to seizure. Gray-white matter differentiation maintained. No encephalomalacia to suggest chronic cortical infarction. No definite foci of susceptibility artifact to suggest acute or chronic intracranial hemorrhage. No  mass lesion, midline shift or mass effect. No hydrocephalus or extra-axial fluid collection. Pituitary gland suprasellar region normal. Midline structures intact. Vascular: Major intracranial vascular flow voids are maintained. Skull and upper cervical spine: Craniocervical junction within normal limits. Bone marrow signal intensity normal. No scalp soft tissue abnormality. Sinuses/Orbits: Globes and orbital soft tissues demonstrate no acute finding. Mild scattered mucosal thickening noted within the ethmoidal air cells and maxillary sinuses. Small right greater than left mastoid effusions noted, of doubtful significance. Visualized nasopharynx within normal limits. Other: None. IMPRESSION: Normal brain MRI for age. No acute intracranial abnormality identified. Electronically Signed   By: Jeannine Boga M.D.   On: 10/13/2019 03:10   US Abdomen Limited RUQ  Result Date: 10/12/2019 CLINICAL DATA:  Elevated LFTs EXAM: ULTRASOUND ABDOMEN LIMITED RIGHT UPPER QUADRANT COMPARISON:  None. FINDINGS: Gallbladder: Prior cholecystectomy Common bile duct: Diameter: Normal caliber, 3 mm Liver: Heterogeneous echotexture compatible with fatty infiltration or intrinsic liver disease. Liver appears enlarged. No focal hepatic abnormality. Portal vein is patent on color Doppler imaging with normal direction of blood flow towards the liver. Other: None. IMPRESSION: Enlarged liver with fatty infiltration or intrinsic liver disease. Electronically Signed   By: Rolm Baptise M.D.   On: 10/12/2019 19:49   Scheduled Meds: . amLODipine  5 mg Oral Daily  . enoxaparin (LOVENOX) injection  40 mg Subcutaneous  Q24H  . feeding supplement (ENSURE ENLIVE)  237 mL Oral BID BM  . folic acid  1 mg Oral Daily  . gabapentin  300 mg Oral QHS  . lisinopril  10 mg Oral Daily  . melatonin  5 mg Oral QHS  . multivitamin with minerals  1 tablet Oral Daily  . oxcarbazepine  600 mg Oral BID  . pantoprazole (PROTONIX) IV  40 mg Intravenous Q12H   . potassium chloride  40 mEq Oral BID  . potassium chloride  40 mEq Oral BID  . rifaximin  550 mg Oral BID  . sertraline  50 mg Oral Daily  . thiamine  100 mg Oral Daily   Or  . thiamine  100 mg Intravenous Daily   Continuous Infusions: . sodium chloride Stopped (10/14/19 0646)  . 0.9 % NaCl with KCl 40 mEq / L 75 mL/hr (10/14/19 1645)  . levETIRAcetam 500 mg (10/14/19 1557)    LOS: 2 days   Kerney Elbe, DO [facility] PAGER is on [place]  If 7PM-7AM, please contact night-coverage [email or web address]

## 2019-10-15 DIAGNOSIS — D72829 Elevated white blood cell count, unspecified: Secondary | ICD-10-CM

## 2019-10-15 DIAGNOSIS — R739 Hyperglycemia, unspecified: Secondary | ICD-10-CM

## 2019-10-15 LAB — COMPREHENSIVE METABOLIC PANEL
ALT: 78 U/L — ABNORMAL HIGH (ref 0–44)
ALT: 89 U/L — ABNORMAL HIGH (ref 0–44)
AST: 363 U/L — ABNORMAL HIGH (ref 15–41)
AST: 405 U/L — ABNORMAL HIGH (ref 15–41)
Albumin: 2.6 g/dL — ABNORMAL LOW (ref 3.5–5.0)
Albumin: 2.8 g/dL — ABNORMAL LOW (ref 3.5–5.0)
Alkaline Phosphatase: 216 U/L — ABNORMAL HIGH (ref 38–126)
Alkaline Phosphatase: 261 U/L — ABNORMAL HIGH (ref 38–126)
Anion gap: 9 (ref 5–15)
Anion gap: 11 (ref 5–15)
BUN: 6 mg/dL (ref 6–20)
BUN: 7 mg/dL (ref 6–20)
CO2: 30 mmol/L (ref 22–32)
CO2: 28 mmol/L (ref 22–32)
Calcium: 7.8 mg/dL — ABNORMAL LOW (ref 8.9–10.3)
Calcium: 8.1 mg/dL — ABNORMAL LOW (ref 8.9–10.3)
Chloride: 94 mmol/L — ABNORMAL LOW (ref 98–111)
Chloride: 95 mmol/L — ABNORMAL LOW (ref 98–111)
Creatinine, Ser: <0.3 mg/dL — ABNORMAL LOW (ref 0.44–1.00)
Creatinine, Ser: <0.3 mg/dL — ABNORMAL LOW (ref 0.44–1.00)
Glucose, Bld: 119 mg/dL — ABNORMAL HIGH (ref 70–99)
Glucose, Bld: 123 mg/dL — ABNORMAL HIGH (ref 70–99)
Potassium: 3.9 mmol/L (ref 3.5–5.1)
Potassium: 4.1 mmol/L (ref 3.5–5.1)
Sodium: 133 mmol/L — ABNORMAL LOW (ref 135–145)
Sodium: 134 mmol/L — ABNORMAL LOW (ref 135–145)
Total Bilirubin: 12 mg/dL — ABNORMAL HIGH (ref 0.3–1.2)
Total Bilirubin: 13.6 mg/dL — ABNORMAL HIGH (ref 0.3–1.2)
Total Protein: 5.6 g/dL — ABNORMAL LOW (ref 6.5–8.1)
Total Protein: 6.4 g/dL — ABNORMAL LOW (ref 6.5–8.1)

## 2019-10-15 LAB — GLUCOSE, CAPILLARY
Glucose-Capillary: 100 mg/dL — ABNORMAL HIGH (ref 70–99)
Glucose-Capillary: 106 mg/dL — ABNORMAL HIGH (ref 70–99)
Glucose-Capillary: 104 mg/dL — ABNORMAL HIGH (ref 70–99)
Glucose-Capillary: 107 mg/dL — ABNORMAL HIGH (ref 70–99)

## 2019-10-15 LAB — CBC WITH DIFFERENTIAL/PLATELET
Abs Immature Granulocytes: 1.21 K/uL — ABNORMAL HIGH (ref 0.00–0.07)
Basophils Absolute: 0.1 K/uL (ref 0.0–0.1)
Basophils Relative: 1 %
Eosinophils Absolute: 0.3 K/uL (ref 0.0–0.5)
Eosinophils Relative: 2 %
HCT: 23.4 % — ABNORMAL LOW (ref 36.0–46.0)
Hemoglobin: 7.7 g/dL — ABNORMAL LOW (ref 12.0–15.0)
Immature Granulocytes: 9 %
Lymphocytes Relative: 15 %
Lymphs Abs: 2.2 K/uL (ref 0.7–4.0)
MCH: 36.3 pg — ABNORMAL HIGH (ref 26.0–34.0)
MCHC: 32.9 g/dL (ref 30.0–36.0)
MCV: 110.4 fL — ABNORMAL HIGH (ref 80.0–100.0)
Monocytes Absolute: 1.4 K/uL — ABNORMAL HIGH (ref 0.1–1.0)
Monocytes Relative: 10 %
Neutro Abs: 9.1 K/uL — ABNORMAL HIGH (ref 1.7–7.7)
Neutrophils Relative %: 63 %
Platelets: 250 K/uL (ref 150–400)
RBC: 2.12 MIL/uL — ABNORMAL LOW (ref 3.87–5.11)
RDW: 22.4 % — ABNORMAL HIGH (ref 11.5–15.5)
Smear Review: NORMAL
WBC: 14.3 K/uL — ABNORMAL HIGH (ref 4.0–10.5)
nRBC: 12.4 % — ABNORMAL HIGH (ref 0.0–0.2)

## 2019-10-15 LAB — AMMONIA: Ammonia: 44 umol/L — ABNORMAL HIGH (ref 9–35)

## 2019-10-15 LAB — PHOSPHORUS
Phosphorus: <1 mg/dL — CL (ref 2.5–4.6)
Phosphorus: 1.6 mg/dL — ABNORMAL LOW (ref 2.5–4.6)

## 2019-10-15 LAB — MAGNESIUM: Magnesium: 1.9 mg/dL (ref 1.7–2.4)

## 2019-10-15 MED ORDER — LEVETIRACETAM 500 MG PO TABS
500.0000 mg | ORAL_TABLET | Freq: Two times a day (BID) | ORAL | Status: AC
  Administered 2019-10-15 – 2019-10-16 (×3): 500 mg via ORAL
  Filled 2019-10-15 (×3): qty 1

## 2019-10-15 MED ORDER — LEVETIRACETAM 500 MG PO TABS
500.0000 mg | ORAL_TABLET | Freq: Two times a day (BID) | ORAL | Status: DC
  Filled 2019-10-15: qty 1

## 2019-10-15 MED ORDER — POTASSIUM PHOSPHATES 15 MMOLE/5ML IV SOLN
30.0000 mmol | Freq: Once | INTRAVENOUS | Status: DC
  Filled 2019-10-15: qty 10

## 2019-10-15 MED ORDER — K PHOS MONO-SOD PHOS DI & MONO 155-852-130 MG PO TABS
500.0000 mg | ORAL_TABLET | Freq: Four times a day (QID) | ORAL | Status: AC
  Administered 2019-10-15 (×3): 500 mg via ORAL
  Filled 2019-10-15 (×3): qty 2

## 2019-10-15 MED ORDER — LACTULOSE 10 GM/15ML PO SOLN: 20.0000 g | Freq: Two times a day (BID) | ORAL | Status: DC | PRN

## 2019-10-15 MED ORDER — POTASSIUM CHLORIDE IN NACL 40-0.9 MEQ/L-% IV SOLN
INTRAVENOUS | Status: AC
  Filled 2019-10-15: qty 1000

## 2019-10-15 NOTE — Progress Notes (Signed)
PT Cancellation Note  Patient Details Name: Ashlee Hawkins MRN: [id number] DOB: 12-23-1980   Cancelled Treatment:    Reason Eval/Treat Not Completed: Medical issues which prohibited therapy  Pt continues to have low potassium, most recent lab values in the mid 2.  Per report from rounding MD requesting K+ to be >3.0 before PT.    Dian Situ 10/15/2019, 7:43 AM

## 2019-10-15 NOTE — Progress Notes (Signed)
PROGRESS NOTE    Ashlee Hawkins  TFT:[id number] DOB: 12/12/1980 DOA: 10/12/2019 PCP: [facility] [place]   Brief Narrative:  HPI per Dr. Eugenie Norrie on 10/12/19 Ashlee Hawkins  is a 39 y.o. Caucasian female with a known history of bipolar 1 disorder, alcoholism, hypertension, chronic liver disease, posttraumatic stress disorder and seizures, who presented to the emergency room with acute onset of recurrent nausea and vomiting for which she was seen today at [facility] clinic and had a seizure episode they are in a couple of episodes in the ER.  She has been having dizziness as well as lightheadedness and epigastric and right upper quadrant abdominal discomfort with occasional loose bowel movements.  She denied any cough or wheezing or dyspnea.  She has been fairly somnolent but easily arousable.  During my interview she was very slow to respond to questions.  Last alcoholic drink was yesterday per her report.  Her father was with her in the room but states that he usually is out for work. No dysuria, oliguria or hematuria or flank pain.  Upon presentation to the emergency room, blood pressure was 168/99 with a heart rate of 53 and respiratory to 22.  Labs revealed hyponatremia 119 hypochloremia of less than 65 and hypokalemia less than 2 with CO2 of 20 and glucose of 2 3, calcium 7.8 with albumin of 3.2.  AST was 338 and ALT 79 with alk phos of 223.  Magnesium was low at 1.2 ammonia levels high at 57 and total bili was significantly high at 12.7.  CBC showed mild cytosis of 10.7 and anemia with hemoglobin 9.8 hematocrit of 27.7 compared to 16.2 and 46.6 on 08/05/2018 and to 12.9/38.2 on 01/14/2018.  Tylenol levels less than 10, PT 13.9 INR 1.1 with PTT of 30. Twelve-lead EKG showed sinus tachycardia with rate 110 with PVCs in a pattern of bigeminy and nonspecific T wave abnormalities.  The patient was given 2 g of IV magnesium sulfate, 10 mEq of IV potassium chloride and 40 mEq p.o., 1 L bolus of IV  normal saline and a banana bag 150 mL/h.  She Hawkins be admitted to medical monitored bed for further evaluation and management.  **Interim History He has alcoholic hepatitis and had symptomatic hyponatremia which is improved.  She is feeling much better today.  Repeat sodium is now 135.  LFTs are still elevated and T bili still elevated as well.  Is signed off and Hawkins continue to monitor her given her seizures.  GI recommending rifaximin twice daily and lactulose as needed to treat hepatic encephalopathy.  They have also recommended starting gabapentin 100 mg 3 times daily for alcohol dependence and preventing withdrawal symptoms.  Dr. Marius Ditch recommends continue monitor LFTs daily  10/15/2019 Patient is much more oriented and labs are improving.  Potassium is normalized now however phosphorus still continues to be extremely low.  Sodium is relatively stable around 133 now.  She has not been out of bed yet so we have consulted physical therapy.  Assessment & Plan:   Active Problems:   Alcoholic hepatitis   Hyponatremia  Symptomatic hyponatremia and hypochloremia, likely hypovolemic secondary to recurrent nausea and vomiting.  It could be related to alcoholic Potomania.,  Improved -The patient Hawkins be admitted to a medically monitored bed. -Patient sodium on admission was 119 and chloride level was less than 65 -She Hawkins be hydrated with IV normal saline and this is being continued with normal saline +40 mEq of KCl at 75 mL's  per hour.  We Hawkins follow serial BMPs. -Sodium is now improved from 119 and has trended up to 133; chloride level is now 94 -Repeat CMP this evening is pending; continue to follow serial BMPs and CMP -TSH was 0.742 -Serum osmolality was 259  Acute Hepatitis likely alcoholic with associated hepatic encephalopathy and abnormal LFTs. Hyperbilirubinemia -We Hawkins follow LFTs with hydration. -AST went from 338 and now 337 yesterday and today is now 363 -ALT went from 79 is now  78 and is now 78 again -We Hawkins obtain acute viral hepatitis panel which was negative -We Hawkins place her on scheduled lactulose and follow daily ammonia level.  Ammonia level was elevated at 57 and repeat was 53 yesterday morning and repeat ammonia level this a.m. is now 44 -[name] moderate discriminant function score was 10.9 and did not meet criteria for prednisolone -GI recommends starting gabapentin 100 g 2-3 times daily for alcohol withdrawals and starting rifaximin 550 mg p.o. twice daily along with lactulose 30 g 2 times daily -[name] T bili went from 12.7 and trended up to 16.7 and trended down to 14.6 but is now 15.8 and morning is now trending back down and today is 12.0 -GI consultation appreciate Dr. Verlin Grills evaluation; patient does not qualify for prednisolone given her discriminant function score being less than 32 -GI recommends continue monitoring and monitoring LFTs daily  Recurrent seizures.  This could be related to alcohol withdrawal. -We Hawkins obtain an alcohol level. -We Hawkins place on seizure precautions and as needed IV Ativan. -We Hawkins continue her Trileptal however she was unable to safely take p.o. so he was started on a Keppra load given IV Keppra 1500 mg x 2 and Neurology Hawkins recommend continuing IV 500 mg every 12 hours; OF note last time the patient refill Trileptal was back in 2020 November -Obtain a brain MRI without contrast specially given her slurred speech to rule out CVA; the MRI of the brain showed "Normal brain MRI for age. No acute intracranial abnormality identified." -Neurology was consulted and Dr. Cheral Marker and because the patient was unable to take p.o. yesterday and failed her swallow evaluation he recommends continuing Keppra 500 mg twice daily and when she is able to take p.o. restarting the Trileptal and overlapping the Keppra by 2 days and then stopping the Keppra -EEG was done and was suggestive of mild diffuse encephalopathy nonspecific to  etiology but no seizures or epileptiform discharges seen throughout this recording -Dr. Cheral Marker also recommends discontinuing ongoing blood total daily dose of her Ultram as it can lower the seizure threshold -Continue with inpatient seizure precautions and CIWA protocol  Hypomagnesemia -[name] magnesium level on admission was 1.2 and after repletion is improved and today is 1.9 -continue monitor and replete as necessary -Repeat Mag level in a.m.  Hypokalemia, improved -Significant and very severe -On admission patient's potassium was less than 2.0 and on repeat this morning was 3.9 -Was getting normal saline +40 mEq of KCl and reduce the rate to 50 mL/h for another 10 more hours -Continue to monitor and replete as necessary -Repeat CMP in a.m.  Macrocytic Anemia likely in the setting of alcoholism -[name] hemoglobin/hematocrit went from 9.8/27.7 and trended down to 7.0/21.1 and improved to 8.6/24.9 yesterday and today is back down again as 7.7/23.4; MCV today was 110.4 -Likely dilutional drop -Check anemia panel and showed an iron level 197, U IBC of 0, TIBC 197, saturation ratios 100%, ferritin level 4306, folate level 15.4, and vitamin B12 level  of 598 -We Hawkins obtain anemia work-up including stool Hemoccult. -Continue to monitor for signs and symptoms of bleeding; currently no overt bleeding noted Repeat CBC in a.m.  Essential Hypertension. -Continue with amlodipine 5 mg p.o. daily as well as lisinopril 10 mg p.o. daily X-continue IV hydralazine 10 mg every 6 as needed for systolic blood pressure greater than 979 or diastolic blood pressure greater than 110 -Last BP was 123/88 -Continue to Monitor BP per protocol   Hyperglycemia -Patient blood sugar has been ranging from 95-1 28 -Check hemoglobin A1c in a.m. -Continue monitor blood sugars carefully if necessary Hawkins place on a sensitive NovoLog sign scale insulin  Hypophosphatemia -[name] phosphorus level has been  less than 1.0x2 -Replete with IV K Phos 30 mmol and received a dose yesterday and another dose this morning -Also repleted with 500 mg of p.o. K-Phos Neutral 4 times daily for 3 more doses -Continue to monitor and replete as necessary -Repeat phosphorus level in a.m.  Leukocytosis -Likely reactive -Patient WBC went from 10.0 is now 14.3 Continue to monitor for signs and symptoms of infection; currently no overt infection noted Repeat CBC in the a.m. and if continues to worsen we Hawkins panculture-patient is having a lot of diarrhea likely in setting of her lactulose Hawkins cut the lactulose dose back given that she is having more than 2-3 bowel movements a day  Depression. -We Hawkins continue her antidepressants if able to take p.o. with sertraline 50 mg p.o. daily  Hypocalcemia -Paitent's Ca2+ went from 7.6 -> 7.0 -> 7.8 and is 7.8 again; albumin level was 2.6 -Given 1 g of IV calcium gluconate few days ago -Corrected calcium is now 8.9  Obesity -Estimated body mass index is 31.76 kg/m as calculated from the following:   Height as of this encounter: 5' 4"  (1.626 m).   Weight as of this encounter: 83.9 kg. -Weight Loss and Dietary Counseling  -Dietitian was consulted and recommending Ensure Enlive p.o. twice daily as well continue multivitamin with minerals, folic acid, and thiamine daily  DVT prophylaxis: Enoxaparin 40 mg sq q24h Code Status: FULL CODE  Family Communication: No family present at bedside  Disposition Plan: Pending further clinical improvement back to baseline and correction of electrolytes; her electrolytes are improving but she still not worked with physical therapy yet anticipating discharge within next 48 hours  Status is: Inpatient  Remains inpatient appropriate because:Persistent severe electrolyte disturbances, Altered mental status, Unsafe d/c plan, IV treatments appropriate due to intensity of illness or inability to take PO and Inpatient level of care  appropriate due to severity of illness   Dispo: The patient is from: Home              Anticipated d/c is to: TBD              Anticipated d/c date is: 2 days              Patient currently is not medically stable to d/c.  Consultants:   Neurology  Gastroenterology   Procedures:  EEG This study is suggestive of mild diffuse encephalopathy, nonspecific etiology.  No seizures or epileptiform discharges were seen throughout the recording.  Antimicrobials:  Anti-infectives (From admission, onward)   Start     Dose/Rate Route Frequency Ordered Stop   10/13/19 2200  rifaximin (XIFAXAN) tablet 550 mg        550 mg Oral 2 times daily 10/13/19 1549          Subjective: Seen  and examined and states that she is doing better and slept okay.  No nausea or vomiting.  Wants to get out of bed.  States that she is having a lot of bowel movements and has had 4 bowel movements already.  No chest pain, lightheadedness or dizziness.  No other concerns or complaints at this time.  Objective: Vitals:   10/14/19 2035 10/15/19 0025 10/15/19 0521 10/15/19 0805  BP: 95/72 121/77 90/63 123/88  Pulse: 96 (!) 102 92 99  Resp: 16 17 16 17   Temp: 99.1 F (37.3 C) 98.4 F (36.9 C) 98.4 F (36.9 C) 97.8 F (36.6 C)  TempSrc: Oral Oral Oral Oral  SpO2: 95% 94% 93% 98%  Weight:      Height:        Intake/Output Summary (Last 24 hours) at 10/15/2019 1217 Last data filed at 10/14/2019 1718 Gross per 24 hour  Intake 240 ml  Output --  Net 240 ml   Filed Weights   10/12/19 1516  Weight: 83.9 kg   Examination: Physical Exam:  Constitutional: WN/WD obese Caucasian female currently no acute distress appears calm and does appear comfortable.  Eyes: Lids and conjunctivae normal, sclerae is icteric ENMT: External Ears, Nose appear normal. Grossly normal hearing.  Neck: Appears normal, supple, no cervical masses, normal ROM, no appreciable thyromegaly; no JVD Respiratory: Diminished to auscultation  bilaterally, no wheezing, rales, rhonchi or crackles. Normal respiratory effort and patient is not tachypenic. No accessory muscle use.  Unlabored breathing Cardiovascular: RRR, no murmurs / rubs / gallops. S1 and S2 auscultated.  Has some trace lower extremity edema.   Abdomen: Soft, non-tender, distended secondary body habitus. Bowel sounds positive and slightly hyperactive.  GU: Deferred. Musculoskeletal: No clubbing / cyanosis of digits/nails. No joint deformity upper and lower extremities.  Skin: No rashes, lesions, ulcers on limited skin evaluation but she does have a very jaundiced hue. No induration; Warm and dry.  Neurologic: CN 2-12 grossly intact with no focal deficits. Romberg sign and cerebellar reflexes not assessed.  Psychiatric: Normal judgment and insight. Alert and oriented x 3. Normal mood and appropriate affect.   Data Reviewed: I have personally reviewed following labs and imaging studies  CBC: Recent Labs  Lab 10/12/19 1521 10/12/19 1521 10/13/19 0327 10/13/19 1900 10/14/19 0118 10/14/19 0816 10/15/19 0954  WBC 10.7*   < > 8.2 7.7 9.0 10.0 14.3*  NEUTROABS 8.9*  --   --  5.6 5.9 6.5 9.1*  HGB 9.8*   < > 8.7* 7.0* 7.2* 8.6* 7.7*  HCT 27.7*   < > 24.8* 21.1* 21.4* 24.9* 23.4*  MCV 100.7*   < > 102.1* 107.1* 108.1* 106.4* 110.4*  PLT 186   < > 179 188 209 241 250   < > = values in this interval not displayed.   Basic Metabolic Panel: Recent Labs  Lab 10/13/19 1155 10/13/19 1155 10/13/19 1900 10/14/19 0118 10/14/19 0810 10/14/19 0816 10/14/19 1842 10/15/19 0954  NA 133*   < > 133* 135 134*  --  134* 133*  K 2.1*   < > 2.7* 2.1* 2.5*  --  2.5* 3.9  CL 84*   < > 86* 88* 89*  --  89* 94*  CO2 35*   < > 32 34* 31  --  32 30  GLUCOSE 117*   < > 152* 99 92  --  166* 119*  BUN <5*   < > 5* 6 5*  --  5* 6  CREATININE <0.30*   < >  0.32* <0.30* <0.30*  --  0.32* <0.30*  CALCIUM 7.0*   < > 7.0* 7.8* 7.8*  --  8.0* 7.8*  MG 2.2  --   --  1.9  --  1.8 2.3 1.9    PHOS UNABLE TO REPORT DUE TO ICTERUS   < > UNABLE TO REPORT DUE TO ICTERUS UNABLE TO REPORT DUE TO ICTERUS  --  UNABLE TO REPORT DUE TO ICTERUS <1.0* <1.0*   < > = values in this interval not displayed.   GFR: CrCl cannot be calculated (This lab value cannot be used to calculate CrCl because it is not a number: <0.30). Liver Function Tests: Recent Labs  Lab 10/13/19 1900 10/14/19 0118 10/14/19 0810 10/14/19 1842 10/15/19 0954  AST 303* 316* 337* 333* 363*  ALT 62* 71* 78* 76* 78*  ALKPHOS 152* 176* 205* 224* 216*  BILITOT 14.6* 14.6* 15.8* 12.7* 12.0*  PROT 5.4* 5.9* 6.4* 5.6* 5.6*  ALBUMIN 2.4* 2.7* 2.9* 2.6* 2.6*   No results for input(s): LIPASE, AMYLASE in the last 168 hours. Recent Labs  Lab 10/12/19 1630 10/13/19 0327 10/14/19 0118 10/15/19 0954  AMMONIA 57* 53* 53* 44*   Coagulation Profile: Recent Labs  Lab 10/12/19 1630 10/12/19 2224  INR 1.1 1.1   Cardiac Enzymes: No results for input(s): CKTOTAL, CKMB, CKMBINDEX, TROPONINI in the last 168 hours. BNP (last 3 results) No results for input(s): PROBNP in the last 8760 hours. HbA1C: No results for input(s): HGBA1C in the last 72 hours. CBG: Recent Labs  Lab 10/14/19 0805 10/14/19 1144 10/14/19 2038 10/15/19 0803 10/15/19 1214  GLUCAP 95 104* 128* 100* 106*   Lipid Profile: No results for input(s): CHOL, HDL, LDLCALC, TRIG, CHOLHDL, LDLDIRECT in the last 72 hours. Thyroid Function Tests: Recent Labs    10/12/19 2224  TSH 0.742   Anemia Panel: Recent Labs    10/12/19 2224 10/13/19 0327  VITAMINB12 598  --   FOLATE 15.4  --   FERRITIN 4,306*  --   TIBC 197*  --   IRON 197*  --   RETICCTPCT  --  1.0   Sepsis Labs: No results for input(s): PROCALCITON, LATICACIDVEN in the last 168 hours.  Recent Results (from the past 240 hour(s))  SARS Coronavirus 2 by RT PCR (hospital order, performed in [facility] hospital lab) Nasopharyngeal Nasopharyngeal Swab     Status: None   Collection Time:  10/12/19  6:08 PM   Specimen: Nasopharyngeal Swab  Result Value Ref Range Status   SARS Coronavirus 2 NEGATIVE NEGATIVE Final    Comment: (NOTE) SARS-CoV-2 target nucleic acids are NOT DETECTED.  The SARS-CoV-2 RNA is generally detectable in upper and lower respiratory specimens during the acute phase of infection. The lowest concentration of SARS-CoV-2 viral copies this assay can detect is 250 copies / mL. A negative result does not preclude SARS-CoV-2 infection and should not be used as the sole basis for treatment or other patient management decisions.  A negative result may occur with improper specimen collection / handling, submission of specimen other than nasopharyngeal swab, presence of viral mutation(s) within the areas targeted by this assay, and inadequate number of viral copies (<250 copies / mL). A negative result must be combined with clinical observations, patient history, and epidemiological information.  Fact Sheet for Patients:   StrictlyIdeas.no  Fact Sheet for Healthcare Providers: BankingDealers.co.za  This test is not yet approved or  cleared by the [place] FDA and has been authorized for detection and/or diagnosis of  SARS-CoV-2 by FDA under an Emergency Use Authorization (EUA).  This EUA Hawkins remain in effect (meaning this test can be used) for the duration of the COVID-19 declaration under Section 564(b)(1) of the Act, 21 U.S.C. section 360bbb-3(b)(1), unless the authorization is terminated or revoked sooner.  Performed at [facility], [address] [place], [place] [address]     RN Pressure Injury Documentation:     Estimated body mass index is 31.76 kg/m as calculated from the following:   Height as of this encounter: 5' 4"  (1.626 m).   Weight as of this encounter: 83.9 kg.  Malnutrition Type:  Nutrition Problem: Inadequate oral intake Etiology: social / environmental  circumstances (EtOH abuse)   Malnutrition Characteristics:  Signs/Symptoms: per patient/family report   Nutrition Interventions:  Interventions: Ensure Enlive (each supplement provides 350kcal and 20 grams of protein), MVI  Radiology Studies: EEG  Result Date: 10/13/2019 Lora Havens, MD     10/13/2019  3:46 PM Patient Name: Ashlee Hawkins MRN: [id number] Epilepsy Attending: Lora Havens Referring Physician/Provider: Dr Eugenie Norrie Date: 10/13/2019 Duration: 20.33 mins Patient history: 39  year old female with breakthrough seizures secondary to inability to take PO anticonvulsant at home as well as hyponatremia and hypomagnesemia in the setting of a one week history of N/V. EEG to evaluate for seizure. Level of alertness: Awake AEDs during EEG study: OXC, LEV Technical aspects: This EEG study was done with scalp electrodes positioned according to the 10-20 International system of electrode placement. Electrical activity was acquired at a sampling rate of 500Hz  and reviewed with a high frequency filter of 70Hz  and a low frequency filter of 1Hz . EEG data were recorded continuously and digitally stored. Description: The posterior dominant rhythm consists of 8-9 Hz activity of moderate voltage (25-35 uV) seen predominantly in posterior head regions, symmetric and reactive to eye opening and eye closing. EEG showed intermittent generalized 3 to 6 Hz theta-delta slowing. Physiologic photic driving was not seen during photic stimulation.  Hyperventilation was not performed.   ABNORMALITY -Intermittent slow, generalized IMPRESSION: This study is suggestive of mild diffuse encephalopathy, nonspecific etiology.  No seizures or epileptiform discharges were seen throughout the recording. Lora Havens   CT HEAD WO CONTRAST  Result Date: 10/14/2019 CLINICAL DATA:  Seizure, follow-up EXAM: CT HEAD WITHOUT CONTRAST TECHNIQUE: Contiguous axial images were obtained from the base of the skull through the  vertex without intravenous contrast. COMPARISON:  06/02/2019 FINDINGS: Brain: There is no acute intracranial hemorrhage, mass effect, or edema. Gray-white differentiation is preserved. There is no extra-axial fluid collection. Ventricles and sulci are within normal limits in size and configuration. Vascular: No hyperdense vessel or unexpected calcification. Skull: Calvarium is unremarkable. Sinuses/Orbits: No acute finding. Other: None. IMPRESSION: No acute intracranial abnormality. Electronically Signed   By: Macy Mis M.D.   On: 10/14/2019 10:52   Scheduled Meds: . amLODipine  5 mg Oral Daily  . enoxaparin (LOVENOX) injection  40 mg Subcutaneous Q24H  . feeding supplement (ENSURE ENLIVE)  237 mL Oral BID BM  . folic acid  1 mg Oral Daily  . gabapentin  100 mg Oral TID  . lisinopril  10 mg Oral Daily  . melatonin  5 mg Oral QHS  . multivitamin with minerals  1 tablet Oral Daily  . oxcarbazepine  600 mg Oral BID  . pantoprazole (PROTONIX) IV  40 mg Intravenous Q12H  . phosphorus  500 mg Oral QID  . potassium chloride  40 mEq Oral  BID  . rifaximin  550 mg Oral BID  . sertraline  50 mg Oral Daily  . thiamine  100 mg Oral Daily   Or  . thiamine  100 mg Intravenous Daily   Continuous Infusions: . sodium chloride Stopped (10/14/19 0646)  . 0.9 % NaCl with KCl 40 mEq / L 50 mL/hr at 10/15/19 1108  . levETIRAcetam 500 mg (10/15/19 0443)  . potassium PHOSPHATE IVPB (in mmol)    . potassium PHOSPHATE IVPB (in mmol)      LOS: 3 days   Kerney Elbe, DO [facility] PAGER is on [place]  If 7PM-7AM, please contact night-coverage [email or web address]

## 2019-10-16 LAB — COMPREHENSIVE METABOLIC PANEL
ALT: 90 U/L — ABNORMAL HIGH (ref 0–44)
ALT: 95 U/L — ABNORMAL HIGH (ref 0–44)
AST: 352 U/L — ABNORMAL HIGH (ref 15–41)
AST: 363 U/L — ABNORMAL HIGH (ref 15–41)
Albumin: 2.7 g/dL — ABNORMAL LOW (ref 3.5–5.0)
Albumin: 2.8 g/dL — ABNORMAL LOW (ref 3.5–5.0)
Alkaline Phosphatase: 239 U/L — ABNORMAL HIGH (ref 38–126)
Alkaline Phosphatase: 247 U/L — ABNORMAL HIGH (ref 38–126)
Anion gap: 9 (ref 5–15)
Anion gap: 12 (ref 5–15)
BUN: 8 mg/dL (ref 6–20)
BUN: 8 mg/dL (ref 6–20)
CO2: 28 mmol/L (ref 22–32)
CO2: 26 mmol/L (ref 22–32)
Calcium: 8 mg/dL — ABNORMAL LOW (ref 8.9–10.3)
Calcium: 8 mg/dL — ABNORMAL LOW (ref 8.9–10.3)
Chloride: 97 mmol/L — ABNORMAL LOW (ref 98–111)
Chloride: 96 mmol/L — ABNORMAL LOW (ref 98–111)
Creatinine, Ser: <0.3 mg/dL — ABNORMAL LOW (ref 0.44–1.00)
Creatinine, Ser: 0.34 mg/dL — ABNORMAL LOW (ref 0.44–1.00)
GFR calc Af Amer: >60 mL/min (ref >60)
GFR calc non Af Amer: >60 mL/min (ref >60)
Glucose, Bld: 111 mg/dL — ABNORMAL HIGH (ref 70–99)
Glucose, Bld: 114 mg/dL — ABNORMAL HIGH (ref 70–99)
Potassium: 3.9 mmol/L (ref 3.5–5.1)
Potassium: 3.5 mmol/L (ref 3.5–5.1)
Sodium: 134 mmol/L — ABNORMAL LOW (ref 135–145)
Sodium: 134 mmol/L — ABNORMAL LOW (ref 135–145)
Total Bilirubin: 14.1 mg/dL — ABNORMAL HIGH (ref 0.3–1.2)
Total Bilirubin: 13.9 mg/dL — ABNORMAL HIGH (ref 0.3–1.2)
Total Protein: 6.1 g/dL — ABNORMAL LOW (ref 6.5–8.1)
Total Protein: 6.1 g/dL — ABNORMAL LOW (ref 6.5–8.1)

## 2019-10-16 LAB — MAGNESIUM
Magnesium: 1.6 mg/dL — ABNORMAL LOW (ref 1.7–2.4)
Magnesium: 2.1 mg/dL (ref 1.7–2.4)

## 2019-10-16 LAB — CBC WITH DIFFERENTIAL/PLATELET
Abs Immature Granulocytes: 1.11 10*3/uL — ABNORMAL HIGH (ref 0.00–0.07)
Basophils Absolute: 0.1 10*3/uL (ref 0.0–0.1)
Basophils Relative: 0 %
Eosinophils Absolute: 0.2 10*3/uL (ref 0.0–0.5)
Eosinophils Relative: 2 %
HCT: 25.5 % — ABNORMAL LOW (ref 36.0–46.0)
Hemoglobin: 7.9 g/dL — ABNORMAL LOW (ref 12.0–15.0)
Immature Granulocytes: 9 %
Lymphocytes Relative: 17 %
Lymphs Abs: 2 10*3/uL (ref 0.7–4.0)
MCH: 36.9 pg — ABNORMAL HIGH (ref 26.0–34.0)
MCHC: 31 g/dL (ref 30.0–36.0)
MCV: 119.2 fL — ABNORMAL HIGH (ref 80.0–100.0)
Monocytes Absolute: 0.9 10*3/uL (ref 0.1–1.0)
Monocytes Relative: 8 %
Neutro Abs: 7.4 10*3/uL (ref 1.7–7.7)
Neutrophils Relative %: 64 %
Platelets: 220 10*3/uL (ref 150–400)
RBC: 2.14 MIL/uL — ABNORMAL LOW (ref 3.87–5.11)
RDW: 24 % — ABNORMAL HIGH (ref 11.5–15.5)
Smear Review: NORMAL
WBC: 11.8 10*3/uL — ABNORMAL HIGH (ref 4.0–10.5)
nRBC: 11.6 % — ABNORMAL HIGH (ref 0.0–0.2)

## 2019-10-16 LAB — PHOSPHORUS
Phosphorus: UNDETERMINED mg/dL (ref 2.5–4.6)
Phosphorus: UNDETERMINED mg/dL (ref 2.5–4.6)

## 2019-10-16 LAB — GLUCOSE, CAPILLARY
Glucose-Capillary: 97 mg/dL (ref 70–99)
Glucose-Capillary: 117 mg/dL — ABNORMAL HIGH (ref 70–99)
Glucose-Capillary: 123 mg/dL — ABNORMAL HIGH (ref 70–99)
Glucose-Capillary: 115 mg/dL — ABNORMAL HIGH (ref 70–99)

## 2019-10-16 MED ORDER — MAGNESIUM SULFATE 2 GM/50ML IV SOLN
2.0000 g | Freq: Once | INTRAVENOUS | Status: AC
  Administered 2019-10-16: 2 g via INTRAVENOUS
  Filled 2019-10-16: qty 50

## 2019-10-16 MED ORDER — SODIUM PHOSPHATES 45 MMOLE/15ML IV SOLN
30.0000 mmol | Freq: Once | INTRAVENOUS | Status: AC
  Administered 2019-10-16: 30 mmol via INTRAVENOUS
  Filled 2019-10-16: qty 10

## 2019-10-16 MED ORDER — K PHOS MONO-SOD PHOS DI & MONO 155-852-130 MG PO TABS
500.0000 mg | ORAL_TABLET | Freq: Four times a day (QID) | ORAL | Status: AC
  Administered 2019-10-16 (×3): 500 mg via ORAL
  Filled 2019-10-16 (×3): qty 2

## 2019-10-16 NOTE — Evaluation (Signed)
Physical Therapy Evaluation Patient Details Name: Ashlee Hawkins MRN: [id number] DOB: 1981/01/29 Today's Date: 10/16/2019   History of Present Illness  Patient is a 39 y/o F that presents with nausea, vomiting, and seizure activity. She was found to be hypo-kalemic however this has been addressed. She presents with alcoholic hepatitis.  Clinical Impression  Patient is a 39 y/o F that presents with nausea, vomiting, and seizure activity. She is typically independent at baseline and presents with no significant deficits this date. She is able to perform transfers independently and ambulate a lap around the RN station with no balance deficits observed. She appears to be at her mobility baseline and does not appear to require any further PT services.     Follow Up Recommendations No PT follow up    Equipment Recommendations       Recommendations for Other Services       Precautions / Restrictions Precautions Precautions: None Restrictions Weight Bearing Restrictions: No      Mobility  Bed Mobility Overal bed mobility: Independent             General bed mobility comments: No deficits observed with bed mobility.  Transfers Overall transfer level: Independent               General transfer comment: No deficits observed, swift transfer.  Ambulation/Gait Ambulation/Gait assistance: Independent Gait Distance (Feet): 200 Feet   Gait Pattern/deviations: WFL(Within Functional Limits)     General Gait Details: Patient was able to ambulate independently with no deficits observed.  Stairs            Wheelchair Mobility    Modified Rankin (Stroke Patients Only)       Balance Overall balance assessment: Independent                                           Pertinent Vitals/Pain Pain Assessment: No/denies pain    Home Living Family/patient expects to be discharged to:: Private residence Living Arrangements: Spouse/significant  other Available Help at Discharge: Family Type of Home: House Home Access: Stairs to enter     Home Layout: Two level Home Equipment: None      Prior Function Level of Independence: Independent               Hand Dominance        Extremity/Trunk Assessment   Upper Extremity Assessment Upper Extremity Assessment: Overall WFL for tasks assessed    Lower Extremity Assessment Lower Extremity Assessment: Overall WFL for tasks assessed       Communication   Communication: No difficulties  Cognition Arousal/Alertness: Awake/alert Behavior During Therapy: WFL for tasks assessed/performed Overall Cognitive Status: Within Functional Limits for tasks assessed                                        General Comments      Exercises Other Exercises Other Exercises: Patient independent with bathroom transfer.   Assessment/Plan    PT Assessment Patent does not need any further PT services  PT Problem List         PT Treatment Interventions      PT Goals (Current goals can be found in the Care Plan section)  Acute Rehab PT Goals Patient Stated Goal: To return home safely PT  Goal Formulation: With patient Time For Goal Achievement: 10/30/19 Potential to Achieve Goals: Good    Frequency     Barriers to discharge        Co-evaluation               AM-PAC PT "6 Clicks" Mobility  Outcome Measure Help needed turning from your back to your side while in a flat bed without using bedrails?: None Help needed moving from lying on your back to sitting on the side of a flat bed without using bedrails?: None Help needed moving to and from a bed to a chair (including a wheelchair)?: None Help needed standing up from a chair using your arms (e.g., wheelchair or bedside chair)?: None Help needed to walk in hospital room?: None Help needed climbing 3-5 steps with a railing? : None 6 Click Score: 24    End of Session Equipment Utilized During  Treatment: Gait belt Activity Tolerance: Patient tolerated treatment well Patient left: in chair;with chair alarm set;with call bell/phone within reach Nurse Communication: Mobility status PT Visit Diagnosis: Unsteadiness on feet (R26.81)    Time: 9678-9381 PT Time Calculation (min) (ACUTE ONLY): 20 min   Charges:   PT Evaluation $PT Eval Moderate Complexity: 1 Mod     Alva Garnet PT, DPT, CSCS      10/16/2019, 1:46 PM

## 2019-10-16 NOTE — Progress Notes (Signed)
PROGRESS NOTE    Ashlee Hawkins  UYQ:[id number] DOB: 04-19-1980 DOA: 10/12/2019 [facility]: [facility]   Brief Narrative:  HPI per Dr. Eugenie Norrie on 10/12/19 Ashlee Hawkins  is a 39 y.o. Caucasian female with a known history of bipolar 1 disorder, alcoholism, hypertension, chronic liver disease, posttraumatic stress disorder and seizures, who presented to the emergency room with acute onset of recurrent nausea and vomiting for which she was seen today at [facility] clinic and had a seizure episode they are in a couple of episodes in the ER.  She has been having dizziness as well as lightheadedness and epigastric and right upper quadrant abdominal discomfort with occasional loose bowel movements.  She denied any cough or wheezing or dyspnea.  She has been fairly somnolent but easily arousable.  During my interview she was very slow to respond to questions.  Last alcoholic drink was yesterday per her report.  Her father was with her in the room but states that he usually is out for work. No dysuria, oliguria or hematuria or flank pain.  Upon presentation to the emergency room, blood pressure was 168/99 with a heart rate of 53 and respiratory to 22.  Labs revealed hyponatremia 119 hypochloremia of less than 65 and hypokalemia less than 2 with CO2 of 20 and glucose of 2 3, calcium 7.8 with albumin of 3.2.  AST was 338 and ALT 79 with alk phos of 223.  Magnesium was low at 1.2 ammonia levels high at 57 and total bili was significantly high at 12.7.  CBC showed mild cytosis of 10.7 and anemia with hemoglobin 9.8 hematocrit of 27.7 compared to 16.2 and 46.6 on 08/05/2018 and to 12.9/38.2 on 01/14/2018.  Tylenol levels less than 10, PT 13.9 INR 1.1 with PTT of 30. Twelve-lead EKG showed sinus tachycardia with rate 110 with PVCs in a pattern of bigeminy and nonspecific T wave abnormalities.  The patient was given 2 g of IV magnesium sulfate, 10 mEq of IV potassium chloride and 40 mEq p.o., 1 L bolus of IV  normal saline and a banana bag 150 mL/h.  She will be admitted to medical monitored bed for further evaluation and management.  **Interim History He has alcoholic hepatitis and had symptomatic hyponatremia which is improved.  She is feeling much better today.  Repeat sodium is now 135.  LFTs are still elevated and T bili still elevated as well.  Is signed off and will continue to monitor her given her seizures.  GI recommending rifaximin twice daily and lactulose as needed to treat hepatic encephalopathy.  They have also recommended starting gabapentin 100 mg 3 times daily for alcohol dependence and preventing withdrawal symptoms.  Dr. Marius Ditch recommends continue monitor LFTs daily  10/15/2019 Patient is much more oriented and labs are improving.  Potassium is normalized now however phosphorus still continues to be extremely low.  Sodium is relatively stable around 133 now.  She has not been out of bed yet so we have consulted physical therapy.  10/16/2019 [name] electrolytes have improved and her potassium is normalized and sodium is near normal.  Magnesium was low today so we will be repeating phosphorus was unable to be calculated and was low as well so we will give more phosphorus supplementation.  LFTs are stable and slightly improved.  T bili is still elevated.  PT OT recommending of follow-up and her white blood cell count is fine coming down now.  Assessment & Plan:   Active Problems:  Alcoholic hepatitis   Hyponatremia  Symptomatic hyponatremia and hypochloremia, likely hypovolemic secondary to recurrent nausea and vomiting.  It could be related to alcoholic Potomania.,  Improved -The patient will be admitted to a medically monitored bed. -Patient sodium on admission was 119 and chloride level was less than 65 -She will be hydrated with IV normal saline and this is being continued with normal saline +40 mEq of KCl at 75 mL's per hour.  We will follow serial BMPs. -Sodium is now improved  from 119 and has trended up to 134; chloride level is now 97 -Repeat CMP this evening is pending; continue to follow serial BMPs and CMP -TSH was 0.742 -Serum osmolality was 259  Acute Hepatitis likely alcoholic with associated hepatic encephalopathy and abnormal LFTs. Hyperbilirubinemia -We will follow LFTs with hydration. -AST went from 338 -> 337 -> 363 -> 405 -> 352 -ALT went from 79 -> 78 -> 78 -> 89 -> 90 -We will obtain acute viral hepatitis panel which was negative -We will place her on scheduled lactulose and follow daily ammonia level.  Ammonia level was elevated at 57 and repeat was 53 yesterday morning and repeat ammonia level this a.m. is now 44 yesterday -[name] moderate discriminant function score was 10.9 and did not meet criteria for prednisolone -GI recommends starting gabapentin 100 g 2-3 times daily for alcohol withdrawals and starting rifaximin 550 mg p.o. twice daily along with lactulose 30 g 2 times daily -[name] T bili is elevated and has been fluctuating and now is 14.1 -GI consultation appreciate Dr. Verlin Grills evaluation; patient does not qualify for prednisolone given her discriminant function score being less than 32 -GI recommends continue monitoring and monitoring LFTs daily and since they are relatively stable likely may discharge in the next 24 to 48 hours  Recurrent seizures.  This could be related to alcohol withdrawal. -We will obtain an alcohol level. -We will place on seizure precautions and as needed IV Ativan. -We will continue her Trileptal however she was unable to safely take p.o. so he was started on a Keppra load given IV Keppra 1500 mg x 2 and Neurology will recommend continuing IV 500 mg every 12 hours; OF note last time the patient refill Trileptal was back in 2020 November -Obtain a brain MRI without contrast specially given her slurred speech to rule out CVA; the MRI of the brain showed "Normal brain MRI for age. No acute intracranial  abnormality identified." -Neurology was consulted and Dr. Cheral Marker and because the patient was unable to take p.o. yesterday and failed her swallow evaluation he recommends continuing Keppra 500 mg twice daily and when she is able to take p.o. restarting the Trileptal and overlapping the Keppra by 2 days and then stopping the Keppra; last dose of Keppra today -EEG was done and was suggestive of mild diffuse encephalopathy nonspecific to etiology but no seizures or epileptiform discharges seen throughout this recording -Dr. Cheral Marker also recommends discontinuing ongoing blood total daily dose of her Ultram as it can lower the seizure threshold -Continue with inpatient seizure precautions and CIWA protocol  Hypomagnesemia -[name] magnesium level on admission was 1.2 and after repletion is improved; today is 1.6 so we will replete with IV mag sulfate 2 g -continue monitor and replete as necessary -Repeat Mag level in a.m.  Hypokalemia, improved -Significant and very severe -On admission patient's potassium was less than 2.0 and on repeat this morning was 3.9 again -IV fluid hydration now stopped -Continue to monitor and replete as  necessary -Repeat CMP in a.m.  Macrocytic Anemia likely in the setting of alcoholism -[name] hemoglobin/hematocrit went from 9.8/27.7 and trended down to 7.0/21.1 and improved to 8.6/24.9 yesterday and today is back down again as 7.9/25.5; MCV today was 119.2 -Likely dilutional drop -Check anemia panel and showed an iron level 197, U IBC of 0, TIBC 197, saturation ratios 100%, ferritin level 4306, folate level 15.4, and vitamin B12 level of 598 -We will obtain anemia work-up including stool Hemoccult. -Continue to monitor for signs and symptoms of bleeding; currently no overt bleeding noted Repeat CBC in a.m.  Essential Hypertension. -Continue with amlodipine 5 mg p.o. daily as well as lisinopril 10 mg p.o. daily X-continue IV hydralazine 10 mg every 6 as  needed for systolic blood pressure greater than 370 or diastolic blood pressure greater than 110 -Last BP was 107/79 -Continue to Monitor BP per protocol   Hyperglycemia -Patient blood sugar has been ranging from 97-117 -Check hemoglobin A1c in a.m. -Continue monitor blood sugars carefully if necessary will place on a sensitive NovoLog sign scale insulin  Hypophosphatemia -[name] phosphorus level has been less than 1.0x2 d improved to 1.6 bu then was unable to be calculated today -Replete with IV Sodium Phos 30 mmol -Also repleted with 500 mg of p.o. K-Phos Neutral 4 times daily for 3 more doses again  -Continue to monitor and replete as necessary -Repeat phosphorus level in a.m.  Leukocytosis -Likely reactive -Patient WBC went from 10.0 is now 14.3 yesterday and today is 11.8 Continue to monitor for signs and symptoms of infection; currently no overt infection noted Repeat CBC in the a.m. and if continues to worsen we will panculture-patient is having a lot of diarrhea likely in setting of her lactulose will cut the lactulose dose back given that she is having more than 2-3 bowel movements a day  Depression. -We will continue her antidepressants if able to take p.o. with sertraline 50 mg p.o. daily  Hypocalcemia -Paitent's Ca2+ went from 7.6 -> 7.0 -> 7.8 -> 8.0; albumin level was 2.7 -Given 1 g of IV calcium gluconate few days ago -Corrected calcium is now 9.0  Obesity -Estimated body mass index is 31.76 kg/m as calculated from the following:   Height as of this encounter: 5' 4"  (1.626 m).   Weight as of this encounter: 83.9 kg. -Weight Loss and Dietary Counseling  -Dietitian was consulted and recommending Ensure Enlive p.o. twice daily as well continue multivitamin with minerals, folic acid, and thiamine daily  DVT prophylaxis: Enoxaparin 40 mg sq q24h Code Status: FULL CODE  Family Communication: No family present at bedside  Disposition Plan: Pending further clinical  improvement back to baseline and correction of electrolytes; her electrolytes are improving and PT recommending no follow up and anticipating discharge within next 24 hours   Status is: Inpatient  Remains inpatient appropriate because:Persistent severe electrolyte disturbances, Altered mental status, Unsafe d/c plan, IV treatments appropriate due to intensity of illness or inability to take PO and Inpatient level of care appropriate due to severity of illness   Dispo: The patient is from: Home              Anticipated d/c is to: TBD              Anticipated d/c date is: 1 day              Patient currently is not medically stable to d/c.  Consultants:   Neurology  Gastroenterology   Procedures:  EEG This study is suggestive of mild diffuse encephalopathy, nonspecific etiology.  No seizures or epileptiform discharges were seen throughout the recording.  Antimicrobials:  Anti-infectives (From admission, onward)   Start     Dose/Rate Route Frequency Ordered Stop   10/13/19 2200  rifaximin (XIFAXAN) tablet 550 mg        550 mg Oral 2 times daily 10/13/19 1549          Subjective: Seen and examined and states that she is doing fairly well.  No nausea or vomiting he denied chest pain, lightheadedness or dizziness.  States that she slept okay.  Wants to go home.  No other concerns or complaints at this time.  Objective: Vitals:   10/15/19 2028 10/16/19 0117 10/16/19 0813 10/16/19 1320  BP: 95/60 111/77 107/79   Pulse: 95 89 (!) 109 (!) 105  Resp: 17 16 15    Temp: 98.3 F (36.8 C) 97.8 F (36.6 C) 97.7 F (36.5 C)   TempSrc: Oral Oral Oral   SpO2: 94% 98% 98% 97%  Weight:      Height:        Intake/Output Summary (Last 24 hours) at 10/16/2019 1421 Last data filed at 10/15/2019 1519 Gross per 24 hour  Intake 551.5 ml  Output --  Net 551.5 ml   Filed Weights   10/12/19 1516  Weight: 83.9 kg   Examination: Physical Exam:  Constitutional: WN/WD Caucasian female  currently in no acute distress appears calm and comfortable, Eyes: Lids and conjunctivae normal, sclerae are icteric ENMT: External Ears, Nose appear normal. Grossly normal hearing.  No JVD Neck: Appears normal, supple, no cervical masses, normal ROM, no appreciable thyromegaly Respiratory: Diminished to auscultation bilaterally, no wheezing, rales, rhonchi or crackles. Normal respiratory effort and patient is not tachypenic. No accessory muscle use.  Unlabored breathing Cardiovascular: RRR, no murmurs / rubs / gallops. S1 and S2 auscultated.  Some trace lower extremity edema Abdomen: Soft, non-tender, distended secondary to body habitus.  Bowel sounds positive.  GU: Deferred. Musculoskeletal: No clubbing / cyanosis of digits/nails. No joint deformity upper and lower extremities.  Skin: No rashes, lesions, ulcers on limited skin evaluation but she is jaundiced. No induration; Warm and dry.  Neurologic: CN 2-12 grossly intact with no focal deficits. Romberg sign and cerebellar reflexes not assessed.  Psychiatric: Normal judgment and insight. Alert and oriented x 3. Normal mood and appropriate affect.   Data Reviewed: I have personally reviewed following labs and imaging studies  CBC: Recent Labs  Lab 10/13/19 1900 10/14/19 0118 10/14/19 0816 10/15/19 0954 10/16/19 0935  WBC 7.7 9.0 10.0 14.3* 11.8*  NEUTROABS 5.6 5.9 6.5 9.1* 7.4  HGB 7.0* 7.2* 8.6* 7.7* 7.9*  HCT 21.1* 21.4* 24.9* 23.4* 25.5*  MCV 107.1* 108.1* 106.4* 110.4* 119.2*  PLT 188 209 241 250 537   Basic Metabolic Panel: Recent Labs  Lab 10/14/19 0118 10/14/19 0118 10/14/19 0810 10/14/19 0816 10/14/19 1842 10/15/19 0954 10/15/19 1750 10/16/19 0935  NA 135   < > 134*  --  134* 133* 134* 134*  K 2.1*   < > 2.5*  --  2.5* 3.9 4.1 3.9  CL 88*   < > 89*  --  89* 94* 95* 97*  CO2 34*   < > 31  --  32 30 28 28   GLUCOSE 99   < > 92  --  166* 119* 123* 111*  BUN 6   < > 5*  --  5* 6 7 8   CREATININE <  0.30*   < > <0.30*   --  0.32* <0.30* <0.30* <0.30*  CALCIUM 7.8*   < > 7.8*  --  8.0* 7.8* 8.1* 8.0*  MG 1.9  --   --  1.8 2.3 1.9  --  1.6*  PHOS UNABLE TO REPORT DUE TO ICTERUS   < >  --  UNABLE TO REPORT DUE TO ICTERUS <1.0* <1.0* 1.6* UNABLE TO REPORT DUE TO ICTERUS   < > = values in this interval not displayed.   GFR: CrCl cannot be calculated (This lab value cannot be used to calculate CrCl because it is not a number: <0.30). Liver Function Tests: Recent Labs  Lab 10/14/19 0810 10/14/19 1842 10/15/19 0954 10/15/19 1750 10/16/19 0935  AST 337* 333* 363* 405* 352*  ALT 78* 76* 78* 89* 90*  ALKPHOS 205* 224* 216* 261* 239*  BILITOT 15.8* 12.7* 12.0* 13.6* 14.1*  PROT 6.4* 5.6* 5.6* 6.4* 6.1*  ALBUMIN 2.9* 2.6* 2.6* 2.8* 2.7*   No results for input(s): LIPASE, AMYLASE in the last 168 hours. Recent Labs  Lab 10/12/19 1630 10/13/19 0327 10/14/19 0118 10/15/19 0954  AMMONIA 57* 53* 53* 44*   Coagulation Profile: Recent Labs  Lab 10/12/19 1630 10/12/19 2224  INR 1.1 1.1   Cardiac Enzymes: No results for input(s): CKTOTAL, CKMB, CKMBINDEX, TROPONINI in the last 168 hours. BNP (last 3 results) No results for input(s): PROBNP in the last 8760 hours. HbA1C: No results for input(s): HGBA1C in the last 72 hours. CBG: Recent Labs  Lab 10/15/19 1214 10/15/19 1623 10/15/19 2038 10/16/19 0814 10/16/19 1143  GLUCAP 106* 104* 107* 97 117*   Lipid Profile: No results for input(s): CHOL, HDL, LDLCALC, TRIG, CHOLHDL, LDLDIRECT in the last 72 hours. Thyroid Function Tests: No results for input(s): TSH, T4TOTAL, FREET4, T3FREE, THYROIDAB in the last 72 hours. Anemia Panel: No results for input(s): VITAMINB12, FOLATE, FERRITIN, TIBC, IRON, RETICCTPCT in the last 72 hours. Sepsis Labs: No results for input(s): PROCALCITON, LATICACIDVEN in the last 168 hours.  Recent Results (from the past 240 hour(s))  SARS Coronavirus 2 by RT PCR (hospital order, performed in [facility] hospital lab)  Nasopharyngeal Nasopharyngeal Swab     Status: None   Collection Time: 10/12/19  6:08 PM   Specimen: Nasopharyngeal Swab  Result Value Ref Range Status   SARS Coronavirus 2 NEGATIVE NEGATIVE Final    Comment: (NOTE) SARS-CoV-2 target nucleic acids are NOT DETECTED.  The SARS-CoV-2 RNA is generally detectable in upper and lower respiratory specimens during the acute phase of infection. The lowest concentration of SARS-CoV-2 viral copies this assay can detect is 250 copies / mL. A negative result does not preclude SARS-CoV-2 infection and should not be used as the sole basis for treatment or other patient management decisions.  A negative result may occur with improper specimen collection / handling, submission of specimen other than nasopharyngeal swab, presence of viral mutation(s) within the areas targeted by this assay, and inadequate number of viral copies (<250 copies / mL). A negative result must be combined with clinical observations, patient history, and epidemiological information.  Fact Sheet for Patients:   StrictlyIdeas.no  Fact Sheet for Healthcare Providers: BankingDealers.co.za  This test is not yet approved or  cleared by the [place] FDA and has been authorized for detection and/or diagnosis of SARS-CoV-2 by FDA under an Emergency Use Authorization (EUA).  This EUA will remain in effect (meaning this test can be used) for the duration of the COVID-19 declaration under Section  564(b)(1) of the Act, 21 U.S.C. section 360bbb-3(b)(1), unless the authorization is terminated or revoked sooner.  Performed at [facility], [address] [place], [place] [address]     RN Pressure Injury Documentation:     Estimated body mass index is 31.76 kg/m as calculated from the following:   Height as of this encounter: 5' 4"  (1.626 m).   Weight as of this encounter: 83.9 kg.  Malnutrition Type:  Nutrition  Problem: Inadequate oral intake Etiology: social / environmental circumstances (EtOH abuse)   Malnutrition Characteristics:  Signs/Symptoms: per patient/family report   Nutrition Interventions:  Interventions: Ensure Enlive (each supplement provides 350kcal and 20 grams of protein), MVI  Radiology Studies: No results found. Scheduled Meds: . amLODipine  5 mg Oral Daily  . enoxaparin (LOVENOX) injection  40 mg Subcutaneous Q24H  . feeding supplement (ENSURE ENLIVE)  237 mL Oral BID BM  . folic acid  1 mg Oral Daily  . gabapentin  100 mg Oral TID  . levETIRAcetam  500 mg Oral BID  . lisinopril  10 mg Oral Daily  . melatonin  5 mg Oral QHS  . multivitamin with minerals  1 tablet Oral Daily  . oxcarbazepine  600 mg Oral BID  . pantoprazole (PROTONIX) IV  40 mg Intravenous Q12H  . phosphorus  500 mg Oral QID  . rifaximin  550 mg Oral BID  . sertraline  50 mg Oral Daily  . thiamine  100 mg Oral Daily   Or  . thiamine  100 mg Intravenous Daily   Continuous Infusions: . sodium chloride Stopped (10/14/19 0646)  . sodium phosphate  Dextrose 5% IVPB 30 mmol (10/16/19 1151)    LOS: 4 days   Kerney Elbe, DO [facility] PAGER is on [place]  If 7PM-7AM, please contact night-coverage [email or web address]

## 2019-10-17 LAB — COMPREHENSIVE METABOLIC PANEL
ALT: 85 U/L — ABNORMAL HIGH (ref 0–44)
AST: 304 U/L — ABNORMAL HIGH (ref 15–41)
Albumin: 2.5 g/dL — ABNORMAL LOW (ref 3.5–5.0)
Alkaline Phosphatase: 212 U/L — ABNORMAL HIGH (ref 38–126)
Anion gap: 8 (ref 5–15)
BUN: 8 mg/dL (ref 6–20)
CO2: 28 mmol/L (ref 22–32)
Calcium: 7.8 mg/dL — ABNORMAL LOW (ref 8.9–10.3)
Chloride: 99 mmol/L (ref 98–111)
Creatinine, Ser: <0.3 mg/dL — ABNORMAL LOW (ref 0.44–1.00)
Glucose, Bld: 95 mg/dL (ref 70–99)
Potassium: 3.9 mmol/L (ref 3.5–5.1)
Sodium: 135 mmol/L (ref 135–145)
Total Bilirubin: 12.7 mg/dL — ABNORMAL HIGH (ref 0.3–1.2)
Total Protein: 5.6 g/dL — ABNORMAL LOW (ref 6.5–8.1)

## 2019-10-17 LAB — CBC WITH DIFFERENTIAL/PLATELET
Abs Immature Granulocytes: 0.75 10*3/uL — ABNORMAL HIGH (ref 0.00–0.07)
Basophils Absolute: 0.1 10*3/uL (ref 0.0–0.1)
Basophils Relative: 1 %
Eosinophils Absolute: 0.3 10*3/uL (ref 0.0–0.5)
Eosinophils Relative: 3 %
HCT: 23.1 % — ABNORMAL LOW (ref 36.0–46.0)
Hemoglobin: 7.1 g/dL — ABNORMAL LOW (ref 12.0–15.0)
Immature Granulocytes: 8 %
Lymphocytes Relative: 24 %
Lymphs Abs: 2.4 10*3/uL (ref 0.7–4.0)
MCH: 37.6 pg — ABNORMAL HIGH (ref 26.0–34.0)
MCHC: 30.7 g/dL (ref 30.0–36.0)
MCV: 122.2 fL — ABNORMAL HIGH (ref 80.0–100.0)
Monocytes Absolute: 1.1 10*3/uL — ABNORMAL HIGH (ref 0.1–1.0)
Monocytes Relative: 11 %
Neutro Abs: 5.4 10*3/uL (ref 1.7–7.7)
Neutrophils Relative %: 53 %
Platelets: 178 10*3/uL (ref 150–400)
RBC: 1.89 MIL/uL — ABNORMAL LOW (ref 3.87–5.11)
RDW: 25.1 % — ABNORMAL HIGH (ref 11.5–15.5)
WBC: 10 10*3/uL (ref 4.0–10.5)
nRBC: 17.3 % — ABNORMAL HIGH (ref 0.0–0.2)

## 2019-10-17 LAB — GLUCOSE, CAPILLARY
Glucose-Capillary: 106 mg/dL — ABNORMAL HIGH (ref 70–99)
Glucose-Capillary: 82 mg/dL (ref 70–99)
Glucose-Capillary: 84 mg/dL (ref 70–99)
Glucose-Capillary: 99 mg/dL (ref 70–99)

## 2019-10-17 LAB — HEMOGLOBIN A1C
Hgb A1c MFr Bld: 5.2 % (ref 4.8–5.6)
Mean Plasma Glucose: 102.54 mg/dL

## 2019-10-17 LAB — PATHOLOGIST SMEAR REVIEW

## 2019-10-17 LAB — MAGNESIUM: Magnesium: 2.1 mg/dL (ref 1.7–2.4)

## 2019-10-17 LAB — PHOSPHORUS: Phosphorus: 3.1 mg/dL (ref 2.5–4.6)

## 2019-10-17 MED ORDER — LACTULOSE 10 GM/15ML PO SOLN: 20.0000 g | Freq: Two times a day (BID) | ORAL | 0 refills | Status: AC | PRN

## 2019-10-17 MED ORDER — FOLIC ACID 1 MG PO TABS: 1.0000 mg | ORAL_TABLET | Freq: Every day | ORAL | 0 refills | Status: AC

## 2019-10-17 MED ORDER — AMLODIPINE BESYLATE 5 MG PO TABS: 5.0000 mg | ORAL_TABLET | Freq: Every day | ORAL | 0 refills | Status: AC

## 2019-10-17 MED ORDER — ENSURE ENLIVE PO LIQD: 237.0000 mL | Freq: Two times a day (BID) | ORAL | 12 refills | Status: AC

## 2019-10-17 MED ORDER — RIFAXIMIN 550 MG PO TABS: 550.0000 mg | ORAL_TABLET | Freq: Two times a day (BID) | ORAL | 0 refills | Status: AC

## 2019-10-17 MED ORDER — LISINOPRIL 10 MG PO TABS: 10.0000 mg | ORAL_TABLET | Freq: Every day | ORAL | 0 refills | Status: AC

## 2019-10-17 MED ORDER — ONDANSETRON HCL 4 MG PO TABS: 4.0000 mg | ORAL_TABLET | Freq: Four times a day (QID) | ORAL | 0 refills | Status: AC | PRN

## 2019-10-17 MED ORDER — SERTRALINE HCL 50 MG PO TABS: 50.0000 mg | ORAL_TABLET | Freq: Every day | ORAL | 0 refills | Status: AC

## 2019-10-17 MED ORDER — GABAPENTIN 100 MG PO CAPS: 100.0000 mg | ORAL_CAPSULE | Freq: Three times a day (TID) | ORAL | 0 refills | Status: AC

## 2019-10-17 MED ORDER — OXCARBAZEPINE 600 MG PO TABS: 600.0000 mg | ORAL_TABLET | Freq: Two times a day (BID) | ORAL | 0 refills | Status: AC

## 2019-10-17 NOTE — Progress Notes (Signed)
Patient discharging home. Instructions given to patient. Verbalized understanding, family coming to transport patient home.

## 2019-10-17 NOTE — Plan of Care (Signed)
  Problem: Education: Goal: Knowledge of General Education information will improve Description: Including pain rating scale, medication(s)/side effects and non-pharmacologic comfort measures Outcome: Adequate for Discharge   Problem: Activity: Goal: Risk for activity intolerance will decrease Outcome: Adequate for Discharge   Problem: Nutrition: Goal: Adequate nutrition will be maintained Outcome: Adequate for Discharge   Problem: Elimination: Goal: Will not experience complications related to urinary retention Outcome: Adequate for Discharge   Problem: Pain Managment: Goal: General experience of comfort will improve Outcome: Adequate for Discharge   Problem: Safety: Goal: Ability to remain free from injury will improve Outcome: Adequate for Discharge   Problem: Skin Integrity: Goal: Risk for impaired skin integrity will decrease Outcome: Adequate for Discharge

## 2019-10-17 NOTE — Discharge Instructions (Signed)
Alcoholic Hepatitis  Alcoholic hepatitis is liver inflammation that is caused by drinking a lot of alcohol over a long period of time. This inflammation decreases the liver's ability to function normally. This condition requires you to stop drinking alcohol permanently to prevent further damage. What are the causes? Alcoholic hepatitis is caused by long-term (chronic) heavy alcohol use. The liver filters alcohol out of the bloodstream. When alcohol gets divided into small particles (broken down) in the liver, substances are produced that can damage liver cells. This causes destruction of liver cells and inflammation. What increases the risk? The following factors may make you more likely to develop this condition:  Regularly drinking large amounts of alcohol, especially in a short amount of time (binge drinking).  Drinking heavily for years.  Being female.  Being obese.  Having had a hepatitis infection in the past.  Having a liver problem that you were born with (genetic liver disease).  Having a lack (deficiency) of certain nutrients, such as folate or thiamine.  Having a parent or sibling who has alcoholic hepatitis. What are the signs or symptoms? Symptoms of this condition include:  Pain and swelling in the abdomen.  Loss of appetite.  Losing weight without trying.  Nausea and vomiting.  Diarrhea.  Fever.  Fatigue.  Yellowing of the skin and the whites of the eyes (jaundice).  Veins that you can see ("spider veins"), especially in the abdomen.  Bleeding easily, such as excessive bleeding from a minor cut.  Itching.  Trouble thinking clearly.  Memory problems.  Mood changes.  Confusion. How is this diagnosed? This condition may be diagnosed with:  A physical exam and a review of medical history.  Blood tests to check liver function.  Tests that create detailed images of the body. These may include: ? A liver ultrasound. ? CT scan. ? MRI.  A  liver biopsy. For this test, a small sample of liver tissue is removed and checked for signs of liver damage. How is this treated? The most important part of treatment is to stop drinking alcohol. If you are addicted to alcohol, your health care provider will help you make a plan to quit. This plan may involve:  Taking medicine to decrease unpleasant symptoms that are caused by stopping or decreasing alcohol use (withdrawal symptoms).  Entering a treatment program to help you stop drinking.  Joining a support group. Treatment for alcoholic hepatitis may also include:  Steroid medicines to reduce inflammation.  Nutritional therapy. Your health care provider or a diet and nutrition specialist (dietitian) may recommend: ? Eating a healthy diet. ? Eating specific foods that contain vitamins and minerals to help you maintain nutrient levels in your body. ? Taking vitamins and dietary supplements to make sure you maintain nutrient levels in your body.  Receiving a donated liver (liver transplant). This is only done in very severe cases, and only for people who have completely stopped drinking and can commit to never drinking alcohol again. Follow these instructions at home:   Do not drink alcohol. Follow your treatment plan, and work with your health care provider as needed.  Consider joining an alcohol support group. These groups can provide emotional support and guidance.  Take over-the-counter and prescription medicines only as told by your health care provider. These include vitamins and supplements.  Do not use medicines or eat foods that contain alcohol unless told by your health care provider.  Follow instructions from your health care provider or dietitian about nutritional therapy.    Keep all follow-up visits as told by your health care provider. This is important. Contact a health care provider if:  You have a fever.  You have a decreased appetite.  You have flu-like  symptoms such as fatigue, weakness, or muscle aches.  You have nausea or vomiting.  You bruise easily.  Your urine is very dark.  You develop new pain in your abdomen. Get help right away if:  You vomit blood.  You develop jaundice.  You have severely itchy skin.  Your legs swell.  Your abdomen suddenly swells.  You have stools that are black, tar-like, or bloody.  You bleed easily, such as excessive bleeding from a minor cut.  You are confused or not thinking clearly.  You have a seizure. Summary  Alcoholic hepatitis is liver inflammation that is caused by drinking a lot of alcohol over a long period of time.  Alcoholic hepatitis is diagnosed with blood tests that check liver function.  The most important part of treatment is to stop drinking alcohol. Follow your treatment plan, and work with your health care provider as needed. This information is not intended to replace advice given to you by your health care provider. Make sure you discuss any questions you have with your health care provider. Document Revised: 05/11/2018 Document Reviewed: 10/02/2016 [employer] Patient Education  2020 [employer]  

## 2019-10-17 NOTE — Progress Notes (Signed)
RNCM acknowledged consult, per attending MD no needs.

## 2019-10-17 NOTE — Discharge Summary (Signed)
Physician Discharge Summary  DANESSA MENSCH NID:[id number] DOB: November 10, 1980 DOA: 10/12/2019  PCP: [facility]  Admit date: 10/12/2019 Discharge date: 10/17/2019  Admitted From: Home Disposition:  Home  Recommendations for Outpatient Follow-up:  1. Follow up with PCP in 1-2 weeks 2. Follow up with Gastroenterology within 1-2 weeks 3. Follow up with Neurology within 1 to 2 weeks; continue with seizure precautions 4. Avoid Alcohol  5. Please obtain CMP/CBC, Mag, Phos in one week 6. Please follow up on the following pending results:  Home Health: No Equipment/Devices: No  Discharge Condition: Stable CODE STATUS: FULL CODE  Diet recommendation: Heart Healthy Diet   Brief/Interim Summary: HPI per Dr. Eugenie Norrie on 10/12/19 MelissaMaxwellis a39 y.o.Caucasian femalewith a known history of bipolar 1 disorder, alcoholism, hypertension, chronic liver disease, posttraumatic stress disorder and seizures, who presented to the emergency room with acute onset ofrecurrent nausea and vomiting for which she was seen today at [facility] clinic and had a seizure episode they are in a couple of episodes in the ER. She has been having dizziness as well as lightheadedness and epigastric and right upper quadrant abdominal discomfort with occasional loose bowel movements. She denied any cough or wheezing or dyspnea. She has been fairly somnolent but easily arousable. During my interview she was very slow to respond to questions. Last alcoholic drink was yesterday per her report. Her father was with her in the room but states that he usually is out for work. No dysuria, oliguria or hematuria or flank pain.  Upon presentation to the emergency room, blood pressure was 168/99 with a heart rate of 53 and respiratory to 22. Labs revealed hyponatremia 119 hypochloremia of less than 65 and hypokalemia less than 2 with CO2 of 20 and glucose of 2 3, calcium 7.8 with albumin of 3.2. AST was 338 and ALT  79 with alk phos of 223. Magnesium was low at 1.2 ammonia levels high at 57 and total bili was significantly high at 12.7. CBC showed mild cytosis of 10.7 and anemia with hemoglobin 9.8 hematocrit of 27.7 compared to 16.2 and 46.6 on 7/2/2020and to 12.9/38.2 on 01/14/2018. Tylenol levels less than 10, PT 13.9 INR 1.1 with PTT of 30. Twelve-lead EKG showed sinus tachycardia with rate 110 with PVCs in a pattern of bigeminy and nonspecific T wave abnormalities.  The patient was given 2 g of IV magnesium sulfate, 10 mEq of IV potassium chloride and 40 mEq p.o., 1 L bolus of IV normal saline and a banana bag 150 mL/h. She will be admitted to medical monitored bed for further evaluation and management.  **Interim History He has alcoholic hepatitis and had symptomatic hyponatremia which is improved.  She is feeling much better today.  Repeat sodium is now 135.  LFTs are still elevated and T bili still elevated as well.  Is signed off and will continue to monitor her given her seizures.  GI recommending rifaximin twice daily and lactulose as needed to treat hepatic encephalopathy.  They have also recommended starting gabapentin 100 mg 3 times daily for alcohol dependence and preventing withdrawal symptoms.  Dr. Marius Ditch recommends continue monitor LFTs daily  10/15/2019 Patient is much more oriented and labs are improving.  Potassium is normalized now however phosphorus still continues to be extremely low.  Sodium is relatively stable around 133 now.  She has not been out of bed yet so we have consulted physical therapy.  10/16/2019 [name] electrolytes have improved and her potassium is normalized and sodium is  near normal.  Magnesium was low today so we will be repeating phosphorus was unable to be calculated and was low as well so we will give more phosphorus supplementation.  LFTs are stable and slightly improved.  T bili is still elevated.  PT OT recommending no follow-up and her white blood cell count  is fine coming down now.  10/17/2019 All her electrolytes are within normal limits now but her LFTs and T bili still remain elevated due to her alcoholic hepatitis.  She will need to follow-up with PCP in the outpatient setting and her antihypertensives were reduced to half the dose.  She is told to avoid alcohol and given seizure precautions.  At this time she is stable to be discharged home and she does not have any needs after evaluation by physical therapy.  Discharge Diagnoses:  Active Problems:   Alcoholic hepatitis   Hyponatremia  Symptomatic hyponatremia and hypochloremia, likely hypovolemic secondary to recurrent nausea and vomiting. It could be related to alcoholic Potomania.,  Improved -The patient will be admitted to a medically monitored bed. -Patient sodium on admission was 119 and chloride level was less than 65 -She will be hydrated with IV normal saline and this is being continued with normal saline +40 mEq of KCl at 75 mL's per hour. We will follow serial BMPs. -Sodium is now improved from 119 and has trended up to 134; chloride level is now 135 99 -Repeat CMP this evening is pending; continue to follow serial BMPs and CMP -TSH was 0.742 -Serum osmolality was 259 -Repeat CMP in the outpatient setting  Acute Hepatitis likely alcoholic with associated hepatic encephalopathy and abnormal LFTs. Hyperbilirubinemia -We will follow LFTs with hydration. -AST went from 338 -> 337 -> 363 -> 405 -> 352 and is now 304 -ALT went from 79 -> 78 -> 78 -> 89 -> 90 and is now 85 -We will obtain acute viral hepatitis panel which was negative -We will place her on scheduled lactulose and follow daily ammonia level.  Ammonia level was elevated at 57 and repeat was 53 yesterday morning and repeat ammonia level this a.m. is now 44 the day before yesterday -[name] moderate discriminant function score was 10.9 and did not meet criteria for prednisolone -GI recommends starting gabapentin 100  g 2-3 times daily for alcohol withdrawals and starting rifaximin 550 mg p.o. twice daily along with lactulose 30 g 2 times daily as needed; gabapentin 100 mg p.o. 3 times daily -Follow-up with gastroenterology outpatient setting -[name] T bili is elevated and has been fluctuating and now is 14.1 today and today is now 12.7 slightly improved -GI consultation appreciate Dr. Verlin Grills evaluation; patient does not qualify for prednisolone given her discriminant function score being less than 32 -GI recommends continue monitoring and monitoring LFTs daily and since they are relatively stable  -Follow-up CMP in the outpatient setting  Recurrent seizures. This could be related to alcohol withdrawal. -We will obtain an alcohol level. -We will place on seizure precautions and as needed IV Ativan. -We will continue her Trileptal however she was unable to safely take p.o. so he was started on a Keppra load given IV Keppra 1500 mg x 2 and Neurology will recommend continuing IV 500 mg every 12 hours; OF note last time the patient refill Trileptal was back in 2020 November -Obtain a brain MRI without contrast specially given her slurred speech to rule out CVA; the MRI of the brain showed "Normal brain MRI for age. No acute intracranial abnormality  identified." -Neurology was consulted and Dr. Cheral Marker and because the patient was unable to take p.o. yesterday and failed her swallow evaluation he recommends continuing Keppra 500 mg twice daily and when she is able to take p.o. restarting the Trileptal and overlapping the Keppra by 2 days and then stopping the Keppra; last dose of Keppra today -EEG was done and was suggestive of mild diffuse encephalopathy nonspecific to etiology but no seizures or epileptiform discharges seen throughout this recording -Dr. Cheral Marker also recommends discontinuing ongoing blood total daily dose of her Ultram as it can lower the seizure threshold -Continue with inpatient seizure  precautions and CIWA protocol while hospitalized -Seizure precautions were given and patient was told not to drive: Per [facility] statutes, patients with seizures are not allowed to drive until they have been seizure-free for six months. Use caution when using heavy equipment or power tools. Avoid working on ladders or at heights. Take showers instead of baths. Ensure the water temperature is not too high on the home water heater. Do not go swimming alone. Do not lock yourself in a room alone (i.e. bathroom). When caring for infants or small children, sit down when holding, feeding, or changing them to minimize risk of injury to the child in the event you have a seizure. Maintain good sleep hygiene. Avoid alcohol.   Hypomagnesemia -[name] magnesium level on admission was 1.2 and after repletion is improved; today is 1.6 so we will replete with IV mag sulfate 2 g -Now magnesium level is 2.1 today -continue monitor and replete as necessary -Repeat Mag level in a.m.  Hypokalemia, improved -Significant and very severe -On admission patient's potassium was less than 2.0 and on repeat this morning was 3.9 again today -IV fluid hydration now stopped -Continue to monitor and replete as necessary -Repeat CMP in a.m.  Macrocytic Anemia likely in the setting of alcoholism -[name] hemoglobin/hematocrit went from 9.8/27.7 and trended down to 7.0/21.1 and improved to 8.6/24.9 yesterday and today is back down again as 7.1/23.1 and MCV today was 122.2 -Likely dilutional drop in no active signs and symptoms of bleeding -Check anemia panel and showed an iron level 197, U IBC of 0, TIBC 197, saturation ratios 100%, ferritin level 4306, folate level 15.4, and vitamin B12 level of 598 -We will obtain anemia work-up including stool Hemoccult but she has not had any bleeding -Continue to monitor for signs and symptoms of bleeding; currently no overt bleeding noted -Repeat CBC in a.m. in outpatient  setting  Essential Hypertension. -Continue with amlodipine 5 mg p.o. daily as well as lisinopril 10 mg p.o. daily at discharge; no longer taking amlodipine 10 mg p.o. daily as well as lisinopril 20 mg p.o. daily -continue IV hydralazine 10 mg every 6 as needed for systolic blood pressure greater than 811 or diastolic blood pressure greater than 110 -Last BP was 107/79 -Continue to Monitor BP per protocol   Hyperglycemia likely reactive -Patient blood sugar has been ranging from 82-1 23 -Check hemoglobin A1c was 5.2 -Continue monitor blood sugars carefully if necessary will place on a sensitive NovoLog sign scale insulin  Hypophosphatemia improved -[name] phosphorus level has been less than 1.0x2 d improved to 1.6 bu then was unable to be calculated today -Replete with IV Sodium Phos 30 mmol yesterday -Also repleted with 500 mg of p.o. K-Phos Neutral 4 times daily for 3 more doses again  yesterday -Phosphorus level is now 3.1 -Continue to monitor and replete as necessary -Repeat phosphorus level in a.m.  Leukocytosis -Likely reactive and now improved -Patient WBC went from 10.0 -> 14.3 -> 11.8 and is now 10.0 Continue to monitor for signs and symptoms of infection; currently no overt infection noted Repeat CBC in the a.m. and if continues to worsen we will panculture-patient is having a lot of diarrhea likely in setting of her lactulose will cut the lactulose dose back given that she is having more than 2-3 bowel movements a day  Depression. -We will continue her antidepressants if able to take p.o. with sertraline 50 mg p.o. daily; no longer taking sertraline 100 mg p.o. daily  Hypocalcemia -Paitent's Ca2+ went from 7.6 -> 7.0 -> 7.8 -> 8.0 and today 7.8; albumin level was 2.75 -Given 1 g of IV calcium gluconate few days ago -Continue to monitor and trend in outpatient setting  Obesity -Estimated body mass index is 31.3 kg/m as calculated from the following:   Height  as of this encounter: _0  (1.626 m).   Weight as of this encounter: 82.7 kg. -Dietitian was consulted and recommending Ensure Enlive p.o. twice daily as well continue multivitamin with minerals, folic acid, and thiamine daily  Discharge Instructions  Discharge Instructions    Ambulatory referral to Gastroenterology   Complete by: As directed    What is the reason for referral?: Other Comment - Alcoholic Hepatitis   Call MD for:  difficulty breathing, headache or visual disturbances   Complete by: As directed    Call MD for:  extreme fatigue   Complete by: As directed    Call MD for:  hives   Complete by: As directed    Call MD for:  persistant dizziness or light-headedness   Complete by: As directed    Call MD for:  persistant nausea and vomiting   Complete by: As directed    Call MD for:  redness, tenderness, or signs of infection (pain, swelling, redness, odor or green/yellow discharge around incision site)   Complete by: As directed    Call MD for:  severe uncontrolled pain   Complete by: As directed    Call MD for:  temperature >100.4   Complete by: As directed    Diet - low sodium heart healthy   Complete by: As directed    Discharge instructions   Complete by: As directed    You were cared for by a hospitalist during your hospital stay. If you have any questions about your discharge medications or the care you received while you were in the hospital after you are discharged, you can call the unit and ask to speak with the hospitalist on call if the hospitalist that took care of you is not available. Once you are discharged, your primary care physician will handle any further medical issues. Please note that NO REFILLS for any discharge medications will be authorized once you are discharged, as it is imperative that you return to your primary care physician (or establish a relationship with a primary care physician if you do not have one) for your aftercare needs so that they can  reassess your need for medications and monitor your lab values.  Follow up with PCP, Gastroenterology, and Neurology in the outpatient setting. Take all medications as prescribed. If symptoms change or worsen please return to the ED for evaluation   Increase activity slowly   Complete by: As directed      Allergies as of 10/17/2019   No Known Allergies     Medication List    STOP taking  these medications   ibuprofen 600 MG tablet Commonly known as: ADVIL   naltrexone 50 MG tablet Commonly known as: DEPADE   ondansetron 4 MG disintegrating tablet Commonly known as: Zofran ODT     TAKE these medications   amLODipine 5 MG tablet Commonly known as: NORVASC Take 1 tablet (5 mg total) by mouth daily. What changed: Another medication with the same name was removed. Continue taking this medication, and follow the directions you see here.   clotrimazole 1 % external solution Commonly known as: LOTRIMIN Apply 1 application topically 2 (two) times daily.   diclofenac sodium 1 % Gel Commonly known as: VOLTAREN Apply 2 g topically 4 (four) times daily as needed (pain).   diphenhydrAMINE 25 mg capsule Commonly known as: BENADRYL Take 25 mg by mouth every 6 (six) hours as needed for itching or allergies.   DSS 100 MG Caps Take 100 mg by mouth daily.   feeding supplement (ENSURE ENLIVE) Liqd Take 237 mLs by mouth 2 (two) times daily between meals.   folic acid 1 MG tablet Commonly known as: FOLVITE Take 1 tablet (1 mg total) by mouth daily. Start taking on: October 18, 2019   gabapentin 100 MG capsule Commonly known as: NEURONTIN Take 1 capsule (100 mg total) by mouth 3 (three) times daily.   hydrOXYzine 50 MG tablet Commonly known as: ATARAX/VISTARIL Take 50 mg by mouth 3 (three) times daily as needed.   lactulose 10 GM/15ML solution Commonly known as: CHRONULAC Take 30 mLs (20 g total) by mouth 2 (two) times daily as needed for mild constipation.   lisinopril 10 MG  tablet Commonly known as: ZESTRIL Take 1 tablet (10 mg total) by mouth daily. Start taking on: October 18, 2019 What changed:   medication strength  how much to take   melatonin 3 MG Tabs tablet Take 3 mg by mouth at bedtime.   Multi-Vitamin tablet Take 1 tablet by mouth daily.   nicotine polacrilex 2 MG lozenge Commonly known as: COMMIT Place 2 mg inside cheek every 2 (two) hours as needed.   omeprazole 20 MG capsule Commonly known as: PRILOSEC Take 20 mg by mouth daily.   ondansetron 4 MG tablet Commonly known as: ZOFRAN Take 1 tablet (4 mg total) by mouth every 6 (six) hours as needed for nausea.   oxcarbazepine 600 MG tablet Commonly known as: TRILEPTAL Take 1 tablet (600 mg total) by mouth 2 (two) times daily.   rifaximin 550 MG Tabs tablet Commonly known as: XIFAXAN Take 1 tablet (550 mg total) by mouth 2 (two) times daily.   sertraline 50 MG tablet Commonly known as: ZOLOFT Take 1 tablet (50 mg total) by mouth daily. Start taking on: October 18, 2019 What changed:   medication strength  how much to take   thiamine 100 MG tablet Take 100 mg by mouth daily.   traZODone 50 MG tablet Commonly known as: DESYREL Take 50 mg by mouth at bedtime.   zinc gluconate 50 MG tablet Take 50 mg by mouth daily.       [place] [employer]. Call.   Specialty: General Practice Why: Follow up within 1-2 weeks Contact information: [address] [place] [address] [phone]        Lin Landsman, MD. Call.   Specialty: Gastroenterology Why: Follow up within 1-2 weeks Contact information: [place] [address] [phone]        [facility]  NEUROLOGY. Call.   Why: Follow up for Seizure Disorder Contact information: [place] [phone]             No Known  Allergies  Consultations:  Neurology  Gastroenterology  Procedures/Studies: EEG  Result Date: 10/13/2019 Lora Havens, MD     10/13/2019  3:46 PM Patient Name: RILYN SCROGGS MRN: [id number] Epilepsy Attending: Lora Havens Referring Physician/Provider: Dr Eugenie Norrie Date: 10/13/2019 Duration: 20.33 mins Patient history: 39  year old female with breakthrough seizures secondary to inability to take PO anticonvulsant at home as well as hyponatremia and hypomagnesemia in the setting of a one week history of N/V. EEG to evaluate for seizure. Level of alertness: Awake AEDs during EEG study: OXC, LEV Technical aspects: This EEG study was done with scalp electrodes positioned according to the 10-20 International system of electrode placement. Electrical activity was acquired at a sampling rate of _0  and reviewed with a high frequency filter of _1  and a low frequency filter of _2 . EEG data were recorded continuously and digitally stored. Description: The posterior dominant rhythm consists of 8-9 Hz activity of moderate voltage (25-35 uV) seen predominantly in posterior head regions, symmetric and reactive to eye opening and eye closing. EEG showed intermittent generalized 3 to 6 Hz theta-delta slowing. Physiologic photic driving was not seen during photic stimulation.  Hyperventilation was not performed.   ABNORMALITY -Intermittent slow, generalized IMPRESSION: This study is suggestive of mild diffuse encephalopathy, nonspecific etiology.  No seizures or epileptiform discharges were seen throughout the recording. Lora Havens   CT HEAD WO CONTRAST  Result Date: 10/14/2019 CLINICAL DATA:  Seizure, follow-up EXAM: CT HEAD WITHOUT CONTRAST TECHNIQUE: Contiguous axial images were obtained from the base of the skull through the vertex without intravenous contrast. COMPARISON:  06/02/2019 FINDINGS: Brain: There is no acute intracranial hemorrhage, mass effect, or edema. Gray-white differentiation is  preserved. There is no extra-axial fluid collection. Ventricles and sulci are within normal limits in size and configuration. Vascular: No hyperdense vessel or unexpected calcification. Skull: Calvarium is unremarkable. Sinuses/Orbits: No acute finding. Other: None. IMPRESSION: No acute intracranial abnormality. Electronically Signed   By: Macy Mis M.D.   On: 10/14/2019 10:52   MR BRAIN WO CONTRAST  Result Date: 10/13/2019 CLINICAL DATA:  Initial evaluation for acute seizure. EXAM: MRI HEAD WITHOUT CONTRAST TECHNIQUE: Multiplanar, multiecho pulse sequences of the brain and surrounding structures were obtained without intravenous contrast. COMPARISON:  Prior CT from 06/02/2019. FINDINGS: Brain: Cerebral volume within normal limits. Few scattered foci of subcentimeter T2/FLAIR hyperintensity noted within the supratentorial cerebral white matter, nonspecific, but felt to be within normal limits for age, and of doubtful significance in the acute setting. No abnormal foci of restricted diffusion to suggest acute or subacute ischemia or changes related to seizure. Gray-white matter differentiation maintained. No encephalomalacia to suggest chronic cortical infarction. No definite foci of susceptibility artifact to suggest acute or chronic intracranial hemorrhage. No mass lesion, midline shift or mass effect. No hydrocephalus or extra-axial fluid collection. Pituitary gland suprasellar region normal. Midline structures intact. Vascular: Major intracranial vascular flow voids are maintained. Skull and upper cervical spine: Craniocervical junction within normal limits. Bone marrow signal intensity normal. No scalp soft tissue abnormality. Sinuses/Orbits: Globes and orbital soft tissues demonstrate no acute finding. Mild scattered mucosal thickening noted within the ethmoidal air cells and maxillary sinuses. Small right greater than left mastoid effusions noted, of doubtful significance. Visualized nasopharynx within  normal limits. Other:  None. IMPRESSION: Normal brain MRI for age. No acute intracranial abnormality identified. Electronically Signed   By: Jeannine Boga M.D.   On: 10/13/2019 03:10   US Abdomen Limited RUQ  Result Date: 10/12/2019 CLINICAL DATA:  Elevated LFTs EXAM: ULTRASOUND ABDOMEN LIMITED RIGHT UPPER QUADRANT COMPARISON:  None. FINDINGS: Gallbladder: Prior cholecystectomy Common bile duct: Diameter: Normal caliber, 3 mm Liver: Heterogeneous echotexture compatible with fatty infiltration or intrinsic liver disease. Liver appears enlarged. No focal hepatic abnormality. Portal vein is patent on color Doppler imaging with normal direction of blood flow towards the liver. Other: None. IMPRESSION: Enlarged liver with fatty infiltration or intrinsic liver disease. Electronically Signed   By: Rolm Baptise M.D.   On: 10/12/2019 19:49    Subjective: Seen and examined at bedside and she is doing much better today.  She feels well and her electrolytes are improved and within normal limits.  LFTs still remain elevated and T bili slightly coming down but she feels that this will take some time and I advised her avoid alcohol and gave her seizure precautions.  She is stable to be discharged home at this time and has no needs and will follow up with PCP, neurology, gastroenterology in the outpatient setting.  She is understandable and agreeable with the plan of care.  Discharge Exam: Vitals:   10/17/19 0003 10/17/19 0758  BP: 100/69 108/78  Pulse: 82 78  Resp: 19 16  Temp: 97.6 F (36.4 C) 98.3 F (36.8 C)  SpO2: 98% 100%   Vitals:   10/16/19 2003 10/17/19 0003 10/17/19 0500 10/17/19 0758  BP: 100/72 100/69  108/78  Pulse: 88 82  78  Resp: _0 Temp: 97.8 F (36.6 C) 97.6 F (36.4 C)  98.3 F (36.8 C)  TempSrc: Oral Oral  Oral  SpO2: 99% 98%  100%  Weight:   82.7 kg   Height:       General: Pt is alert, awake, not in acute distress; she is jaundiced Cardiovascular: RRR, S1/S2 +,  no rubs, no gallops Respiratory: Diminished bilaterally, no wheezing, no rhonchi; unlabored breathing Abdominal: Soft, NT, distended secondary body habitus, bowel sounds + Extremities: Minimal edema, no cyanosis  The results of significant diagnostics from this hospitalization (including imaging, microbiology, ancillary and laboratory) are listed below for reference.    Microbiology: Recent Results (from the past 240 hour(s))  SARS Coronavirus 2 by RT PCR (hospital order, performed in [facility] hospital lab) Nasopharyngeal Nasopharyngeal Swab     Status: None   Collection Time: 10/12/19  6:08 PM   Specimen: Nasopharyngeal Swab  Result Value Ref Range Status   SARS Coronavirus 2 NEGATIVE NEGATIVE Final    Comment: (NOTE) SARS-CoV-2 target nucleic acids are NOT DETECTED.  The SARS-CoV-2 RNA is generally detectable in upper and lower respiratory specimens during the acute phase of infection. The lowest concentration of SARS-CoV-2 viral copies this assay can detect is 250 copies / mL. A negative result does not preclude SARS-CoV-2 infection and should not be used as the sole basis for treatment or other patient management decisions.  A negative result may occur with improper specimen collection / handling, submission of specimen other than nasopharyngeal swab, presence of viral mutation(s) within the areas targeted by this assay, and inadequate number of viral copies (<250 copies / mL). A negative result must be combined with clinical observations, patient history, and epidemiological information.  Fact Sheet for Patients:   StrictlyIdeas.no  Fact Sheet for Healthcare Providers: BankingDealers.co.za  This  test is not yet approved or  cleared by the [place] and has been authorized for detection and/or diagnosis of SARS-CoV-2 by FDA under an Emergency Use Authorization (EUA).  This EUA will remain in effect (meaning this test  can be used) for the duration of the COVID-19 declaration under Section 564(b)(1) of the Act, 21 U.S.C. section 360bbb-3(b)(1), unless the authorization is terminated or revoked sooner.  Performed at [facility], [address] [place], [place] [address]     Labs: BNP (last 3 results) No results for input(s): BNP in the last 8760 hours. Basic Metabolic Panel: Recent Labs  Lab 10/14/19 1842 10/14/19 1842 10/15/19 0954 10/15/19 1750 10/16/19 0935 10/16/19 1757 10/17/19 0329  NA 134*   < > 133* 134* 134* 134* 135  K 2.5*   < > 3.9 4.1 3.9 3.5 3.9  CL 89*   < > 94* 95* 97* 96* 99  CO2 32   < > _0 GLUCOSE 166*   < > 119* 123* 111* 114* 95  BUN 5*   < > _1 CREATININE 0.32*   < > <0.30* <0.30* <0.30* 0.34* <0.30*  CALCIUM 8.0*   < > 7.8* 8.1* 8.0* 8.0* 7.8*  MG 2.3  --  1.9  --  1.6* 2.1 2.1  PHOS <1.0*   < > <1.0* 1.6* UNABLE TO REPORT DUE TO ICTERUS UNABLE TO REPORT DUE TO ICTERUS 3.1   < > = values in this interval not displayed.   Liver Function Tests: Recent Labs  Lab 10/15/19 0954 10/15/19 1750 10/16/19 0935 10/16/19 1757 10/17/19 0329  AST 363* 405* 352* 363* 304*  ALT 78* 89* 90* 95* 85*  ALKPHOS 216* 261* 239* 247* 212*  BILITOT 12.0* 13.6* 14.1* 13.9* 12.7*  PROT 5.6* 6.4* 6.1* 6.1* 5.6*  ALBUMIN 2.6* 2.8* 2.7* 2.8* 2.5*   No results for input(s): LIPASE, AMYLASE in the last 168 hours. Recent Labs  Lab 10/12/19 1630 10/13/19 0327 10/14/19 0118 10/15/19 0954  AMMONIA 57* 53* 53* 44*   CBC: Recent Labs  Lab 10/14/19 0118 10/14/19 0816 10/15/19 0954 10/16/19 0935 10/17/19 0329  WBC 9.0 10.0 14.3* 11.8* 10.0  NEUTROABS 5.9 6.5 9.1* 7.4 5.4  HGB 7.2* 8.6* 7.7* 7.9* 7.1*  HCT 21.4* 24.9* 23.4* 25.5* 23.1*  MCV 108.1* 106.4* 110.4* 119.2* 122.2*  PLT 209 241 250 220 178   Cardiac Enzymes: No results for input(s): CKTOTAL, CKMB, CKMBINDEX, TROPONINI in the last 168 hours. BNP: Invalid input(s):  POCBNP CBG: Recent Labs  Lab 10/16/19 1646 10/16/19 2122 10/17/19 0000 10/17/19 0559 10/17/19 0758  GLUCAP 123* 115* 106* 99 82   D-Dimer No results for input(s): DDIMER in the last 72 hours. Hgb A1c Recent Labs    10/17/19 0329  HGBA1C 5.2   Lipid Profile No results for input(s): CHOL, HDL, LDLCALC, TRIG, CHOLHDL, LDLDIRECT in the last 72 hours. Thyroid function studies No results for input(s): TSH, T4TOTAL, T3FREE, THYROIDAB in the last 72 hours.  Invalid input(s): FREET3 Anemia work up No results for input(s): VITAMINB12, FOLATE, FERRITIN, TIBC, IRON, RETICCTPCT in the last 72 hours. Urinalysis No results found for: COLORURINE, APPEARANCEUR, LABSPEC, [place], GLUCOSEU, [place], [place], KETONESUR, PROTEINUR, UROBILINOGEN, NITRITE, LEUKOCYTESUR Sepsis Labs Invalid input(s): PROCALCITONIN,  WBC,  LACTICIDVEN Microbiology Recent Results (from the past 240 hour(s))  SARS Coronavirus 2 by RT PCR (hospital order, performed in [facility] hospital lab) Nasopharyngeal Nasopharyngeal Swab     Status: None  Collection Time: 10/12/19  6:08 PM   Specimen: Nasopharyngeal Swab  Result Value Ref Range Status   SARS Coronavirus 2 NEGATIVE NEGATIVE Final    Comment: (NOTE) SARS-CoV-2 target nucleic acids are NOT DETECTED.  The SARS-CoV-2 RNA is generally detectable in upper and lower respiratory specimens during the acute phase of infection. The lowest concentration of SARS-CoV-2 viral copies this assay can detect is 250 copies / mL. A negative result does not preclude SARS-CoV-2 infection and should not be used as the sole basis for treatment or other patient management decisions.  A negative result may occur with improper specimen collection / handling, submission of specimen other than nasopharyngeal swab, presence of viral mutation(s) within the areas targeted by this assay, and inadequate number of viral copies (<250 copies / mL). A negative result must be combined with  clinical observations, patient history, and epidemiological information.  Fact Sheet for Patients:   StrictlyIdeas.no  Fact Sheet for Healthcare Providers: BankingDealers.co.za  This test is not yet approved or  cleared by the [place] FDA and has been authorized for detection and/or diagnosis of SARS-CoV-2 by FDA under an Emergency Use Authorization (EUA).  This EUA will remain in effect (meaning this test can be used) for the duration of the COVID-19 declaration under Section 564(b)(1) of the Act, 21 U.S.C. section 360bbb-3(b)(1), unless the authorization is terminated or revoked sooner.  Performed at [facility], [address] [place], [place] [address]    Time coordinating discharge: 35 Minutes  SIGNED:  Kerney Elbe, DO [facility] 10/17/2019, 11:02 AM Pager is on [place]  If 7PM-7AM, please contact night-coverage [email or web address]

## 2019-10-24 ENCOUNTER — Emergency Department: Payer: No Typology Code available for payment source

## 2019-10-24 ENCOUNTER — Encounter: Payer: Self-pay | Admitting: Radiology

## 2019-10-24 ENCOUNTER — Emergency Department
Admission: EM | Admit: 2019-10-24 | Discharge: 2019-10-24 | Disposition: A | Discharge status: Home / Self Care | Payer: No Typology Code available for payment source | Attending: Emergency Medicine | Admitting: Emergency Medicine

## 2019-10-24 DIAGNOSIS — F17210 Nicotine dependence, cigarettes, uncomplicated: Secondary | ICD-10-CM | POA: Diagnosis not present

## 2019-10-24 DIAGNOSIS — R109 Unspecified abdominal pain: Secondary | ICD-10-CM | POA: Insufficient documentation

## 2019-10-24 DIAGNOSIS — F10129 Alcohol abuse with intoxication, unspecified: Secondary | ICD-10-CM | POA: Insufficient documentation

## 2019-10-24 DIAGNOSIS — Z79899 Other long term (current) drug therapy: Secondary | ICD-10-CM | POA: Diagnosis not present

## 2019-10-24 DIAGNOSIS — W19XXXA Unspecified fall, initial encounter: Secondary | ICD-10-CM

## 2019-10-24 DIAGNOSIS — Y908 Blood alcohol level of 240 mg/100 ml or more: Secondary | ICD-10-CM | POA: Diagnosis not present

## 2019-10-24 DIAGNOSIS — R7401 Elevation of levels of liver transaminase levels: Secondary | ICD-10-CM | POA: Diagnosis not present

## 2019-10-24 DIAGNOSIS — R17 Unspecified jaundice: Secondary | ICD-10-CM

## 2019-10-24 DIAGNOSIS — R296 Repeated falls: Secondary | ICD-10-CM | POA: Diagnosis not present

## 2019-10-24 DIAGNOSIS — R0789 Other chest pain: Secondary | ICD-10-CM | POA: Diagnosis present

## 2019-10-24 DIAGNOSIS — I10 Essential (primary) hypertension: Secondary | ICD-10-CM | POA: Insufficient documentation

## 2019-10-24 DIAGNOSIS — Z20822 Contact with and (suspected) exposure to covid-19: Secondary | ICD-10-CM | POA: Insufficient documentation

## 2019-10-24 DIAGNOSIS — E876 Hypokalemia: Secondary | ICD-10-CM | POA: Diagnosis not present

## 2019-10-24 LAB — TROPONIN I (HIGH SENSITIVITY)
Troponin I (High Sensitivity): 20 ng/L — ABNORMAL HIGH (ref <18)
Troponin I (High Sensitivity): 15 ng/L (ref <18)

## 2019-10-24 LAB — COMPREHENSIVE METABOLIC PANEL
ALT: 112 U/L — ABNORMAL HIGH (ref 0–44)
AST: 305 U/L — ABNORMAL HIGH (ref 15–41)
Albumin: 2.9 g/dL — ABNORMAL LOW (ref 3.5–5.0)
Alkaline Phosphatase: 230 U/L — ABNORMAL HIGH (ref 38–126)
Anion gap: 13 (ref 5–15)
BUN: <5 mg/dL — ABNORMAL LOW (ref 6–20)
CO2: 19 mmol/L — ABNORMAL LOW (ref 22–32)
Calcium: 8.1 mg/dL — ABNORMAL LOW (ref 8.9–10.3)
Chloride: 98 mmol/L (ref 98–111)
Creatinine, Ser: <0.3 mg/dL — ABNORMAL LOW (ref 0.44–1.00)
Glucose, Bld: 109 mg/dL — ABNORMAL HIGH (ref 70–99)
Potassium: 3.3 mmol/L — ABNORMAL LOW (ref 3.5–5.1)
Sodium: 130 mmol/L — ABNORMAL LOW (ref 135–145)
Total Bilirubin: 8 mg/dL — ABNORMAL HIGH (ref 0.3–1.2)
Total Protein: 6.7 g/dL (ref 6.5–8.1)

## 2019-10-24 LAB — MAGNESIUM: Magnesium: 2.3 mg/dL (ref 1.7–2.4)

## 2019-10-24 LAB — PREGNANCY, URINE: Preg Test, Ur: NEGATIVE

## 2019-10-24 LAB — URINE DRUG SCREEN, QUALITATIVE (ARMC ONLY)
Amphetamines, Ur Screen: NOT DETECTED
Barbiturates, Ur Screen: NOT DETECTED
Benzodiazepine, Ur Scrn: NOT DETECTED
Cannabinoid 50 Ng, Ur ~~LOC~~: NOT DETECTED
Cocaine Metabolite,Ur ~~LOC~~: NOT DETECTED
MDMA (Ecstasy)Ur Screen: NOT DETECTED
Methadone Scn, Ur: NOT DETECTED
Opiate, Ur Screen: NOT DETECTED
Phencyclidine (PCP) Ur S: NOT DETECTED
Tricyclic, Ur Screen: NOT DETECTED

## 2019-10-24 LAB — CBC
HCT: 29.7 % — ABNORMAL LOW (ref 36.0–46.0)
Hemoglobin: 9.2 g/dL — ABNORMAL LOW (ref 12.0–15.0)
MCH: 38.8 pg — ABNORMAL HIGH (ref 26.0–34.0)
MCHC: 31 g/dL (ref 30.0–36.0)
MCV: 125.3 fL — ABNORMAL HIGH (ref 80.0–100.0)
Platelets: 258 10*3/uL (ref 150–400)
RBC: 2.37 MIL/uL — ABNORMAL LOW (ref 3.87–5.11)
RDW: 19.6 % — ABNORMAL HIGH (ref 11.5–15.5)
WBC: 11.6 10*3/uL — ABNORMAL HIGH (ref 4.0–10.5)
nRBC: 0.3 % — ABNORMAL HIGH (ref 0.0–0.2)

## 2019-10-24 LAB — PROTIME-INR
INR: 1.1 (ref 0.8–1.2)
Prothrombin Time: 13.6 seconds (ref 11.4–15.2)

## 2019-10-24 LAB — AMMONIA: Ammonia: 31 umol/L (ref 9–35)

## 2019-10-24 LAB — LACTIC ACID, PLASMA
Lactic Acid, Venous: 3.4 mmol/L (ref 0.5–1.9)
Lactic Acid, Venous: 3.1 mmol/L (ref 0.5–1.9)

## 2019-10-24 LAB — ETHANOL: Alcohol, Ethyl (B): 309 mg/dL (ref <10)

## 2019-10-24 LAB — SARS CORONAVIRUS 2 BY RT PCR (HOSPITAL ORDER, PERFORMED IN ~~LOC~~ HOSPITAL LAB): SARS Coronavirus 2: NEGATIVE

## 2019-10-24 IMAGING — CT CT CHEST-ABD-PELV W/ CM
3 of 5 series · 15 of 36 positions shown, 17 images · IV contrast (omnipaque)
Comparison: None.

CLINICAL DATA: Confusion and multiple falls in the setting of
relapse

EXAM:
CT CHEST, ABDOMEN, AND PELVIS WITH CONTRAST
TECHNIQUE: Multidetector CT imaging of the chest, abdomen and pelvis was
performed following the standard protocol during bolus
administration of intravenous contrast.
CONTRAST:  100mL OMNIPAQUE IOHEXOL 300 MG/ML  SOLN

[Series 4: cap with · axial · 0.82mm/px · z∈[-835,-315]mm · 9 of 132 slices shown, 11 images]
[im 14/132  mediastinal]
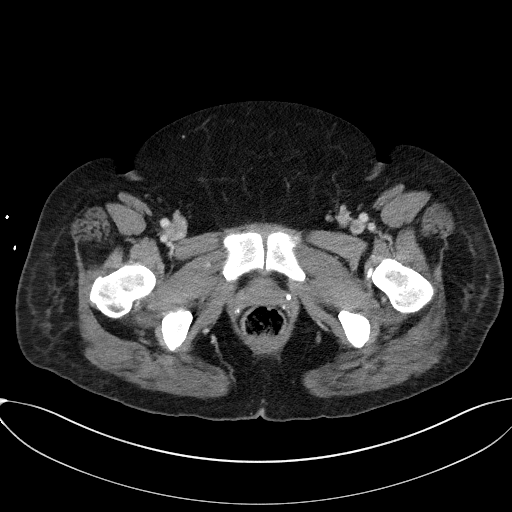
[im 14/132  bone]
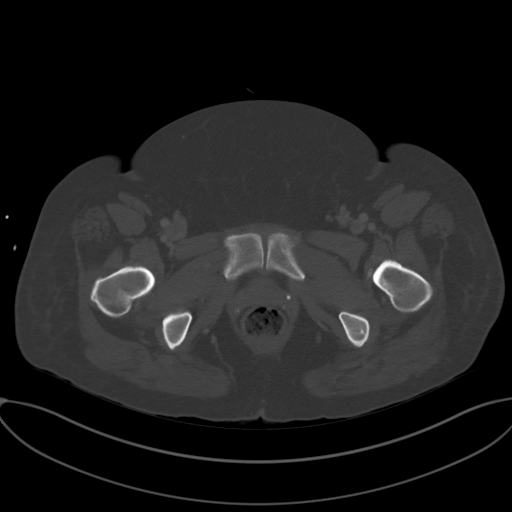
[im 27/132  mediastinal]
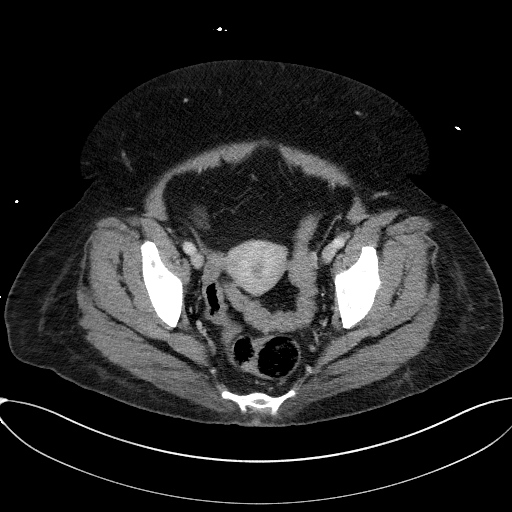
[im 40/132  mediastinal]
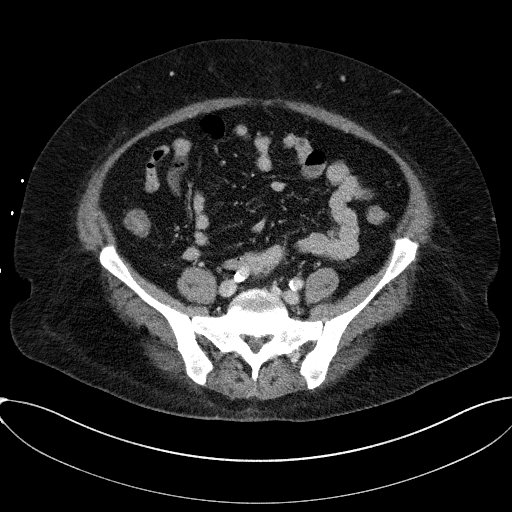
[im 53/132  mediastinal]
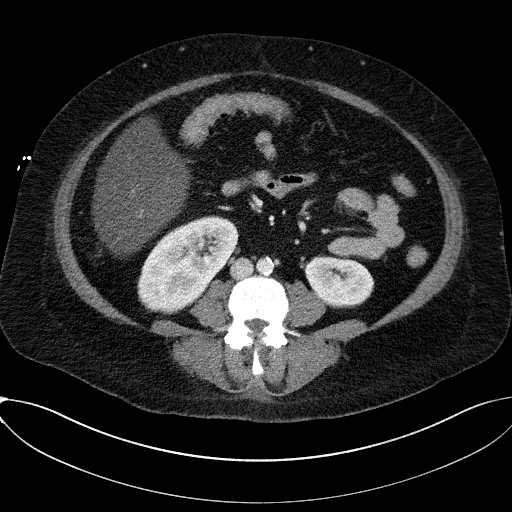
[im 66/132  mediastinal]
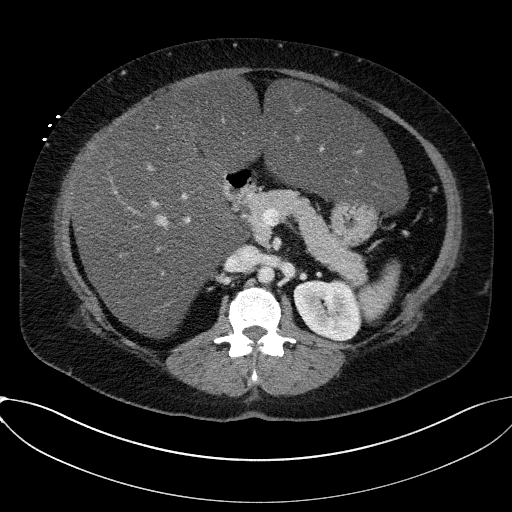
[im 79/132  mediastinal]
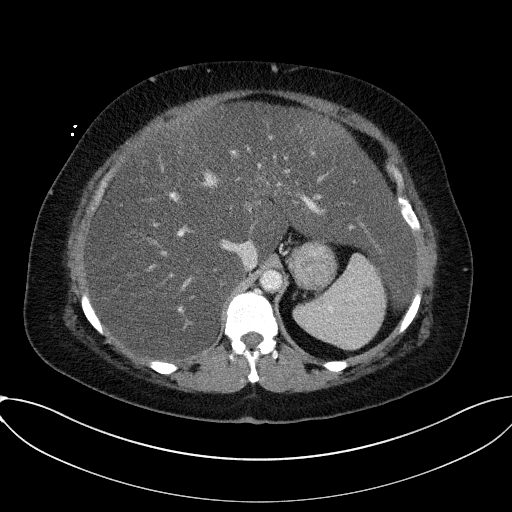
[im 92/132  mediastinal]
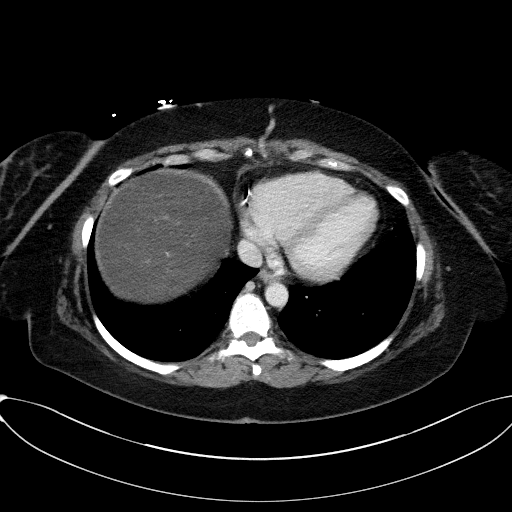
[im 105/132  mediastinal]
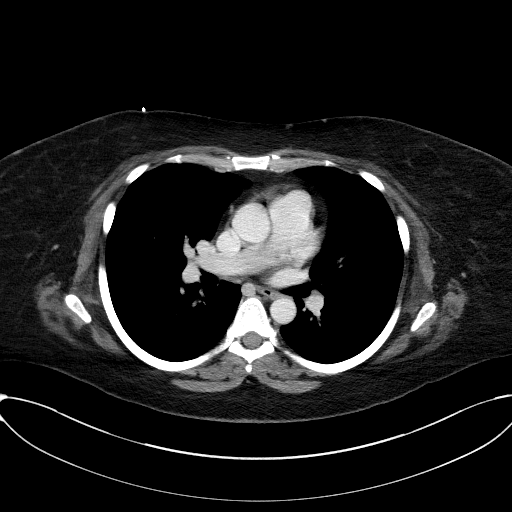
[im 118/132  mediastinal]
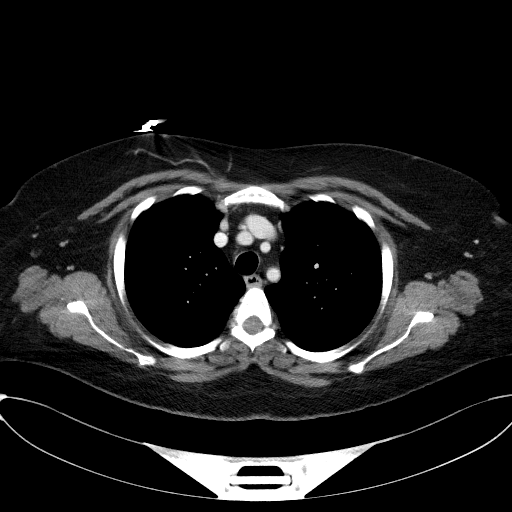
[im 118/132  bone]
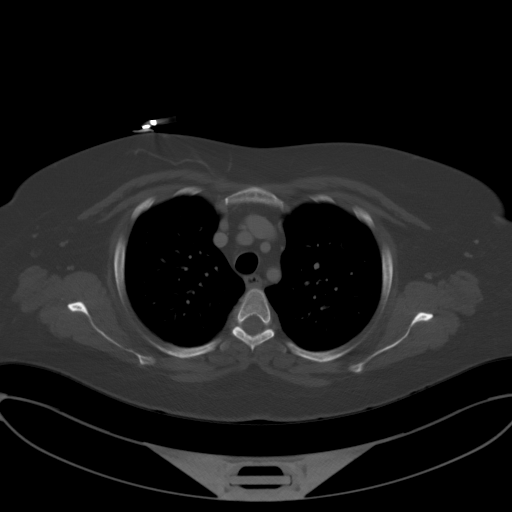

[Series 6: lung · axial · 0.82mm/px · z∈[-515,-441]mm · 3 of 148 slices shown]
[im 13/148  bone]
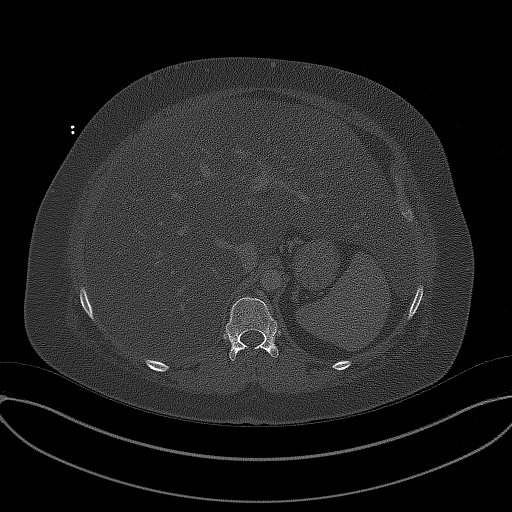
[im 37/148  bone]
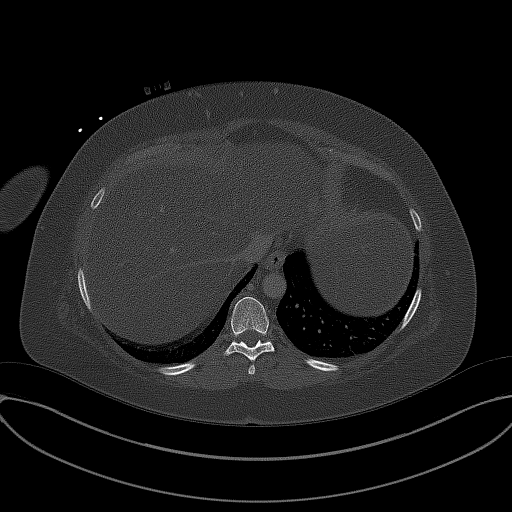
[im 50/148  bone]
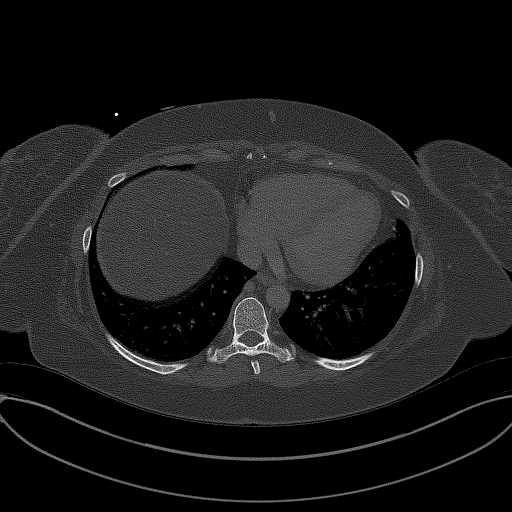

[Series 7: coronals · coronal · 0.82mm/px · 3 of 162 slices shown]
[im 33/162  mediastinal]
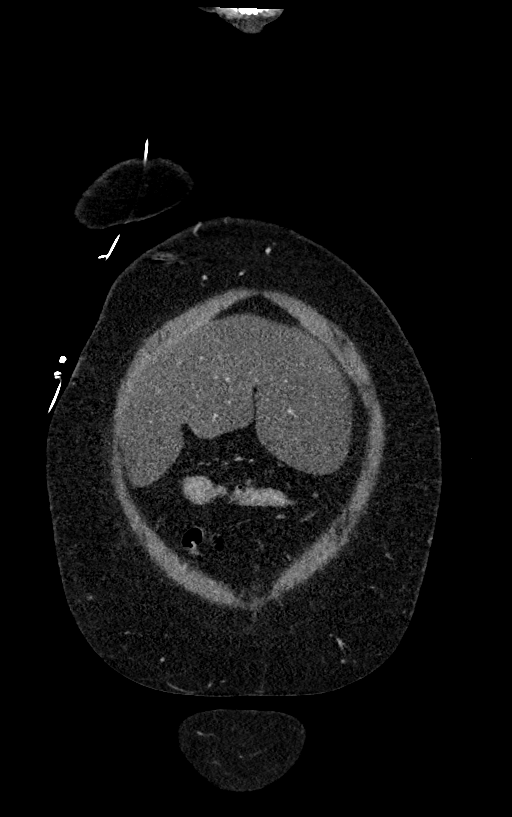
[im 65/162  mediastinal]
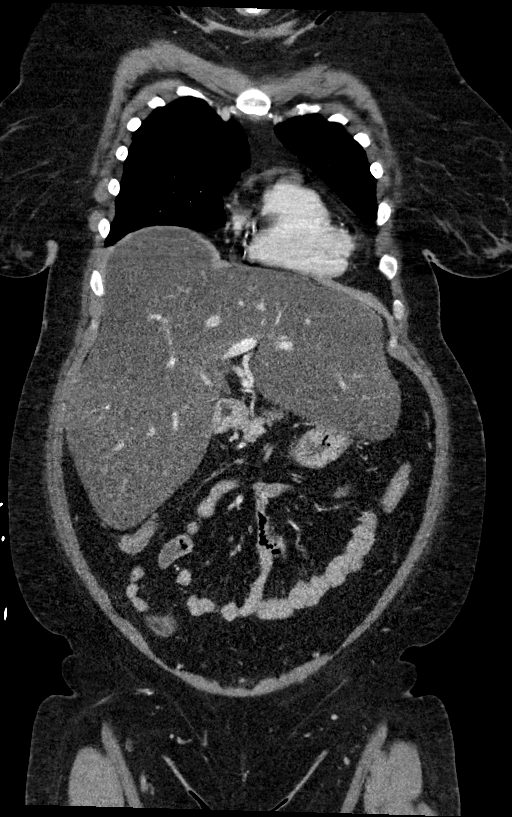
[im 97/162  mediastinal]
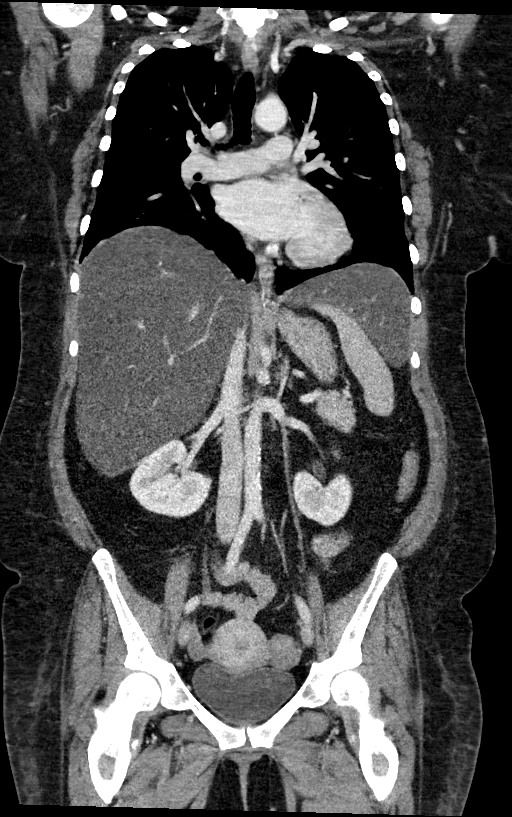

[15 of 36 positions shown; findings below may reference images not displayed]

FINDINGS: CT CHEST FINDINGS

Cardiovascular: Normal heart size. No pericardial effusion.
Extensive coronary atherosclerosis, certainly age advanced. No
evidence of great vessel injury.

Mediastinum/Nodes: No hematoma or pneumomediastinum.

Lungs/Pleura: No hemothorax, pneumothorax, or lung contusion.

Musculoskeletal: Negative for fracture or subluxation.

CT ABDOMEN PELVIS FINDINGS

Hepatobiliary: No hepatic injury or perihepatic hematoma. Severe
hepatic steatosis with enlargement of the liver crossing into the
left upper quadrant and depressing the right kidney.
Cholecystectomy.

Pancreas: Negative

Spleen: No splenic injury or perisplenic hematoma.

Adrenals/Urinary Tract: No adrenal hemorrhage or renal injury
identified. Bladder is unremarkable.

Stomach/Bowel: No evidence of injury

Vascular/Lymphatic: Scattered atheromatous calcification of the
aorta and iliacs. No acute vascular finding.

Reproductive: Negative

Other: No ascites or pneumoperitoneum

Musculoskeletal: No acute fracture or subluxation.
IMPRESSION: 1. No evidence of injury to the chest or abdomen.
2. Severe hepatic steatosis.
3. Premature atherosclerosis which is particularly extensive at the
coronaries.

## 2019-10-24 IMAGING — DX DG CHEST 1V PORT
1 series · 1 of 1 positions shown · non-contrast
Comparison: [DATE]

CLINICAL DATA: Fall, jaundice, chest injury

EXAM:
PORTABLE CHEST 1 VIEW

[chest ap]
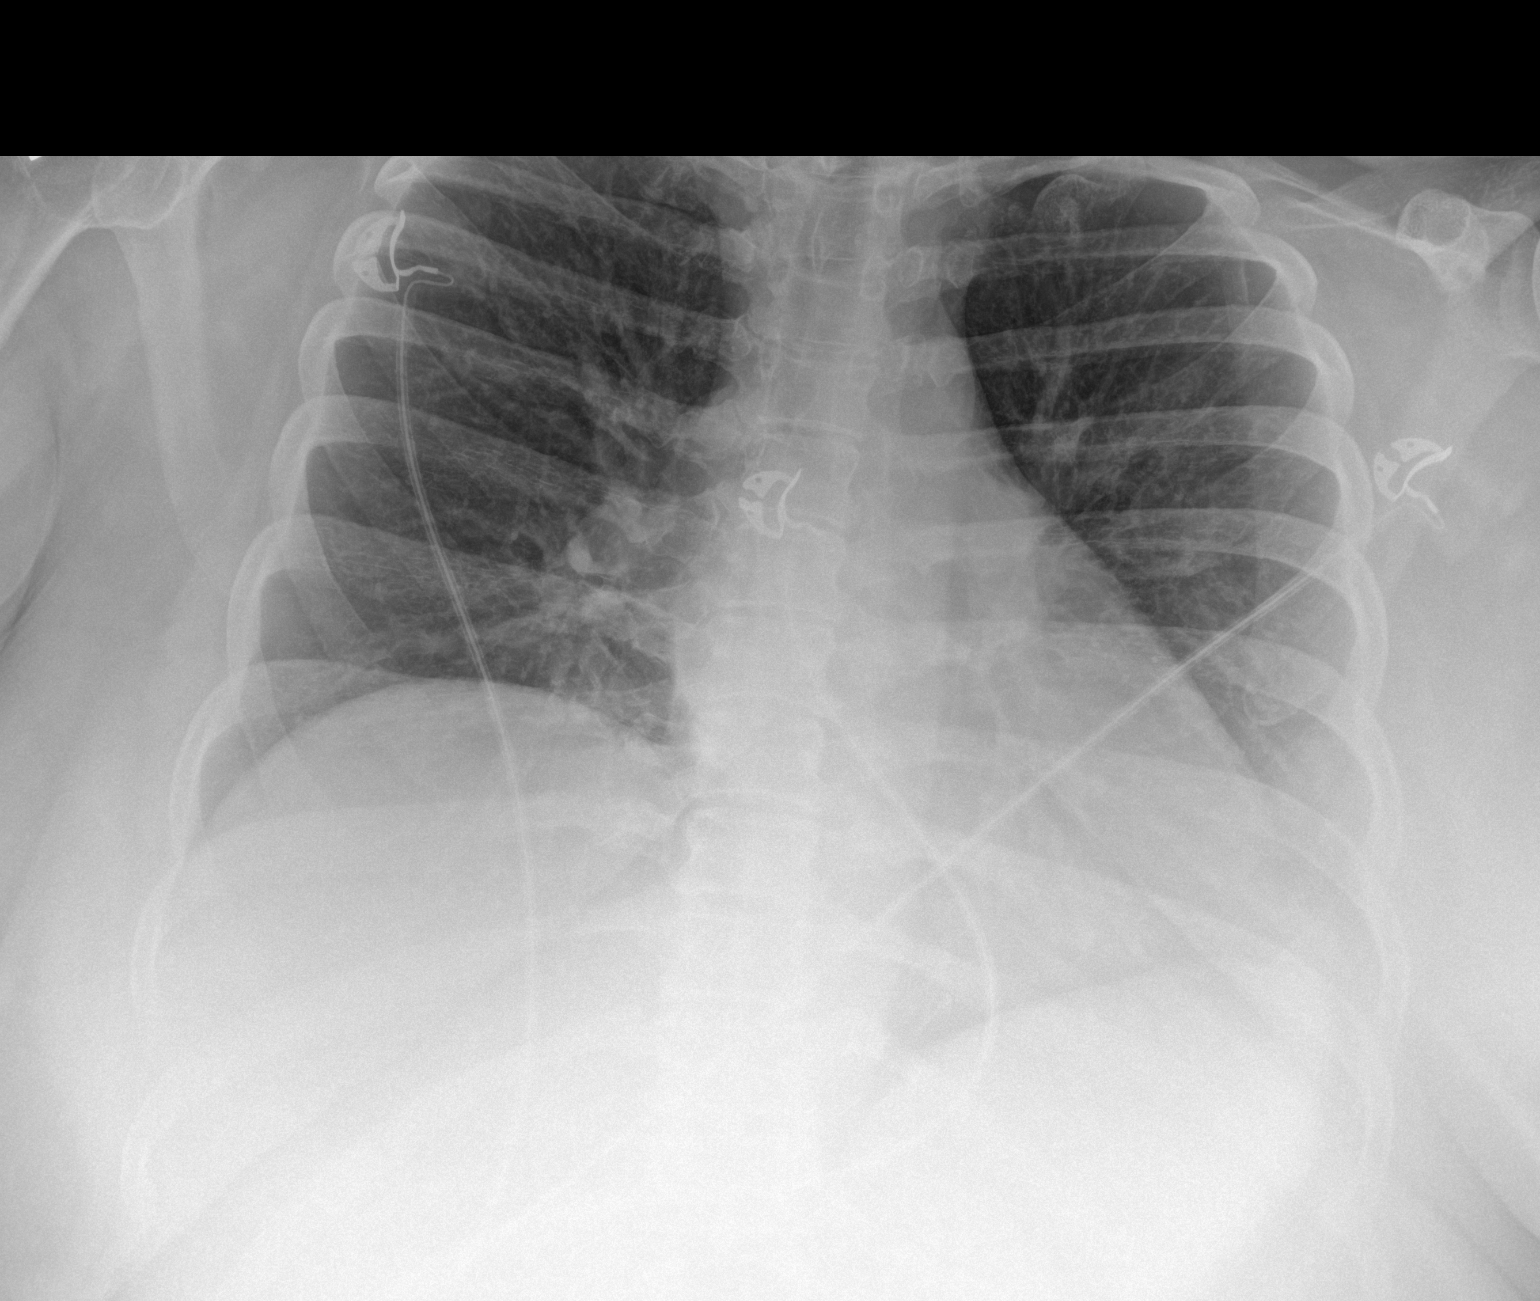

[1 of 1 positions shown; findings below may reference images not displayed]

FINDINGS: The heart size and mediastinal contours are within normal limits.
Both lungs are clear. The visualized skeletal structures are
unremarkable.
IMPRESSION: No active disease.

## 2019-10-24 IMAGING — CT CT CERVICAL SPINE W/O CM
3 of 4 series · 11 of 33 positions shown, 13 images · non-contrast
Comparison: [DATE] head CT

CLINICAL DATA: Confusion and falls.  History of alcohol abuse.

EXAM:
CT HEAD WITHOUT CONTRAST
CT CERVICAL SPINE WITHOUT CONTRAST
TECHNIQUE: Multidetector CT imaging of the head and cervical spine was
performed following the standard protocol without intravenous
contrast. Multiplanar CT image reconstructions of the cervical spine
were also generated.

[Series 7: sagittal bone · sagittal · 0.24mm/px · 5 of 63 slices shown, 6 images]
[im 21/63  bone]
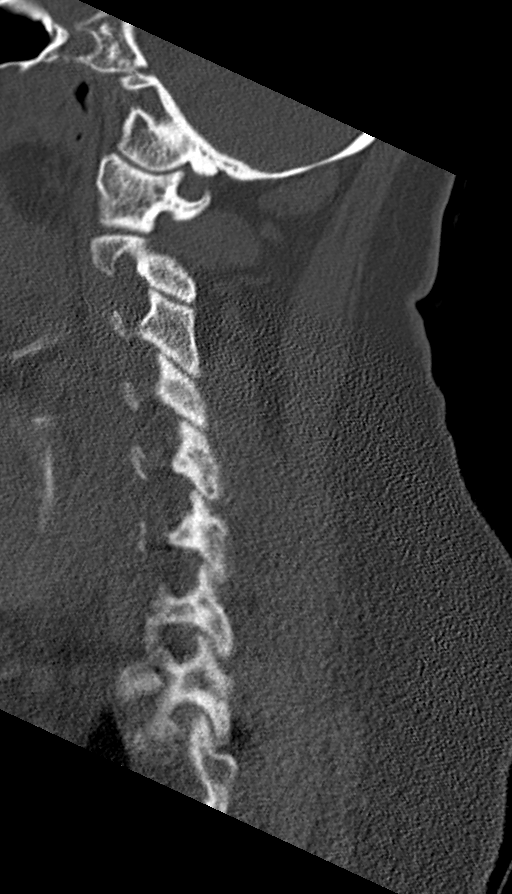
[im 26/63  bone]
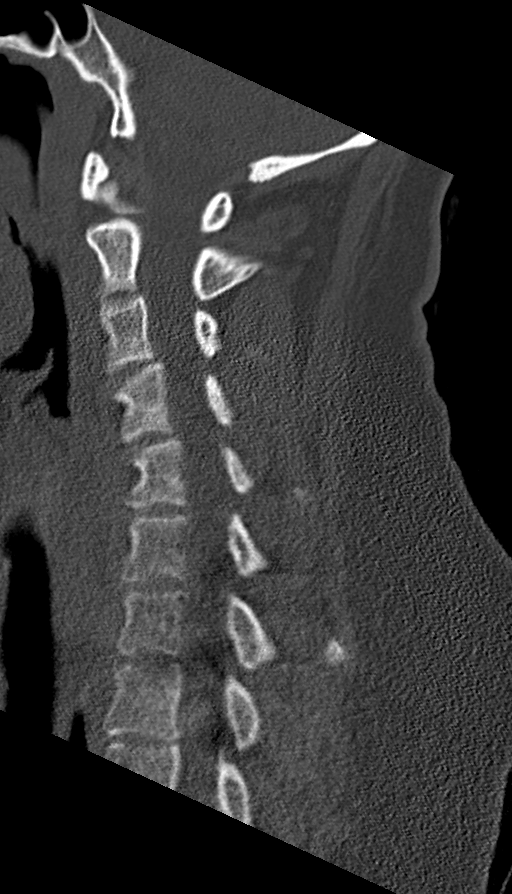
[im 32/63  soft-tissue]
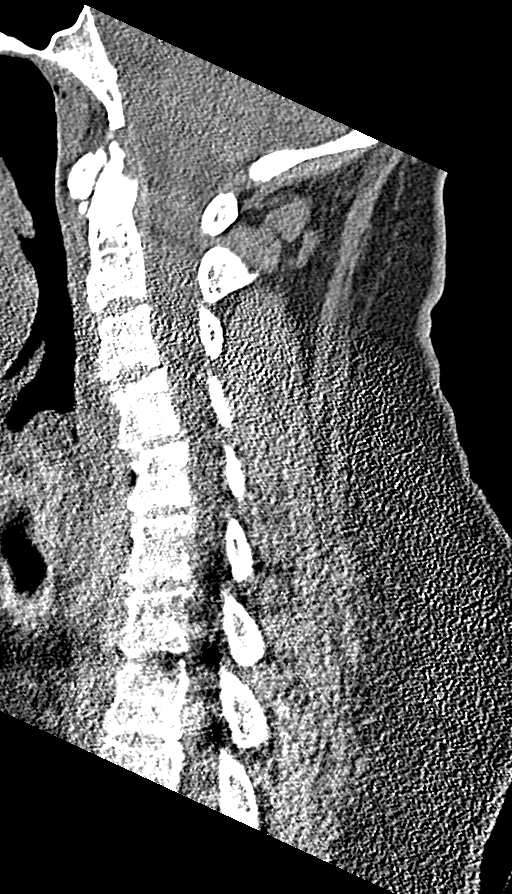
[im 32/63  bone]
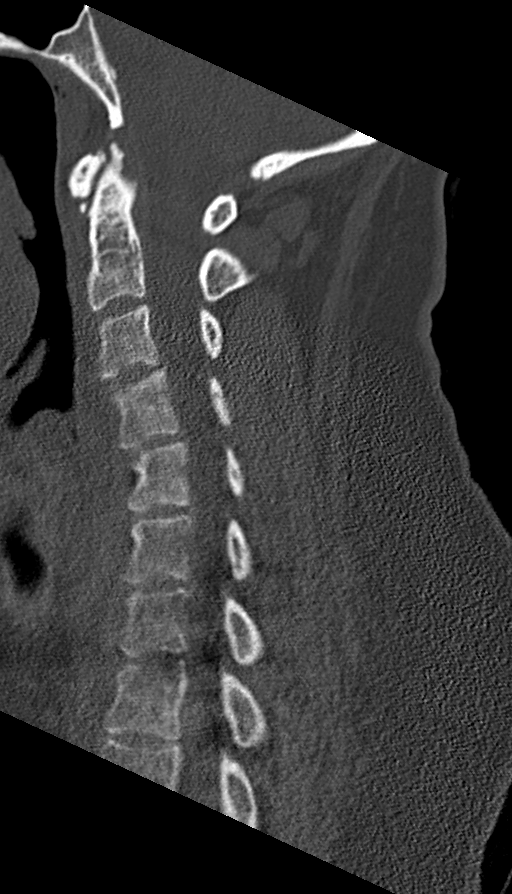
[im 37/63  bone]
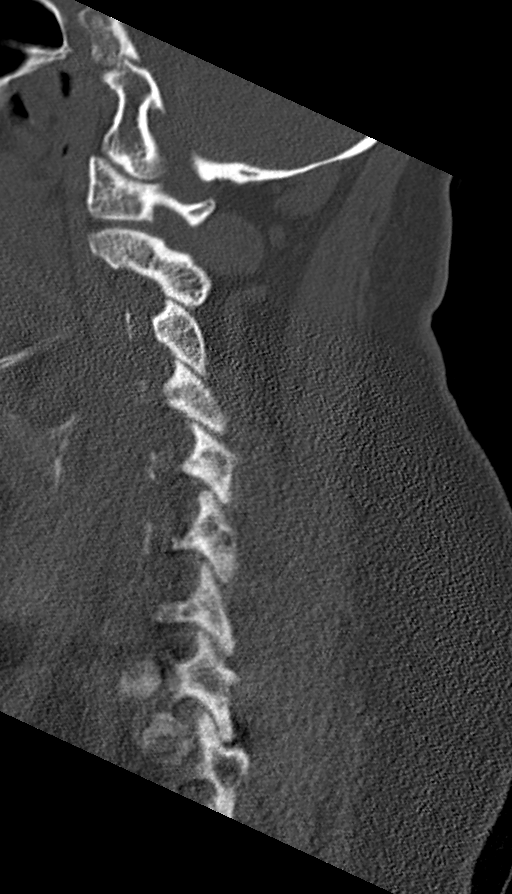
[im 42/63  bone]
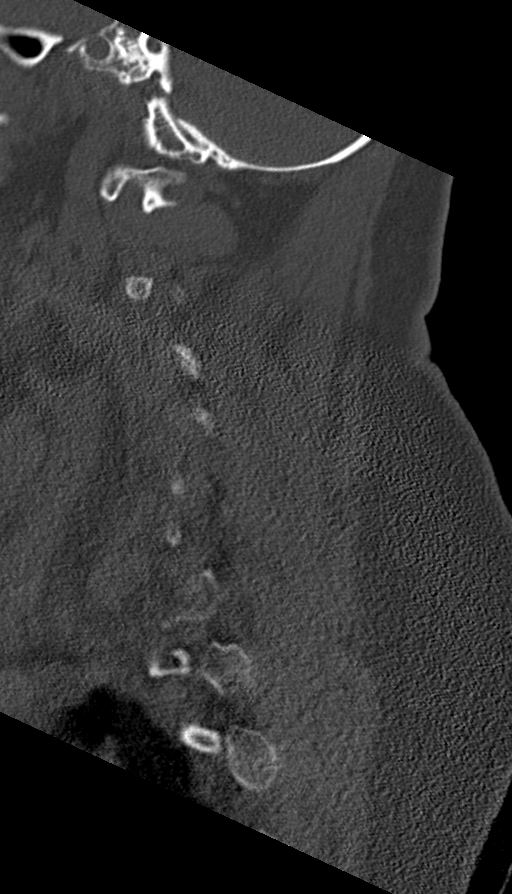

[Series 8: coronal bone · coronal · 0.24mm/px · 3 of 62 slices shown]
[im 13/62  bone]
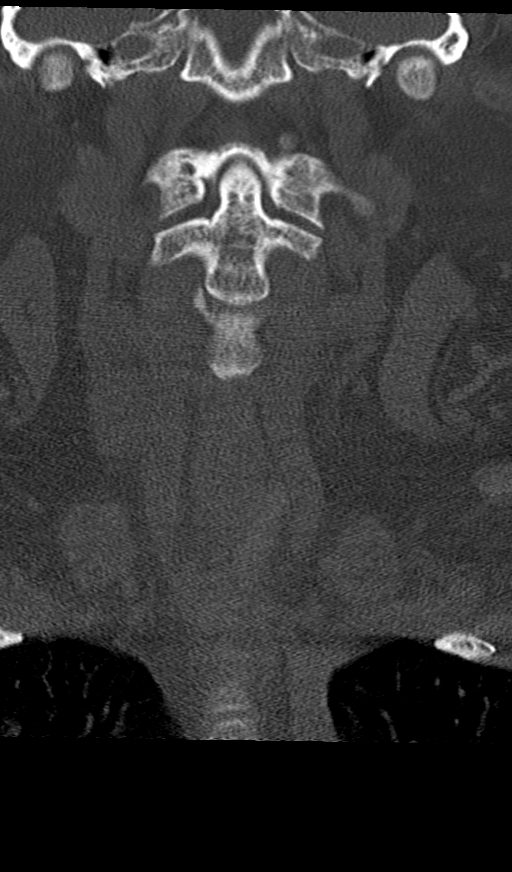
[im 25/62  bone]
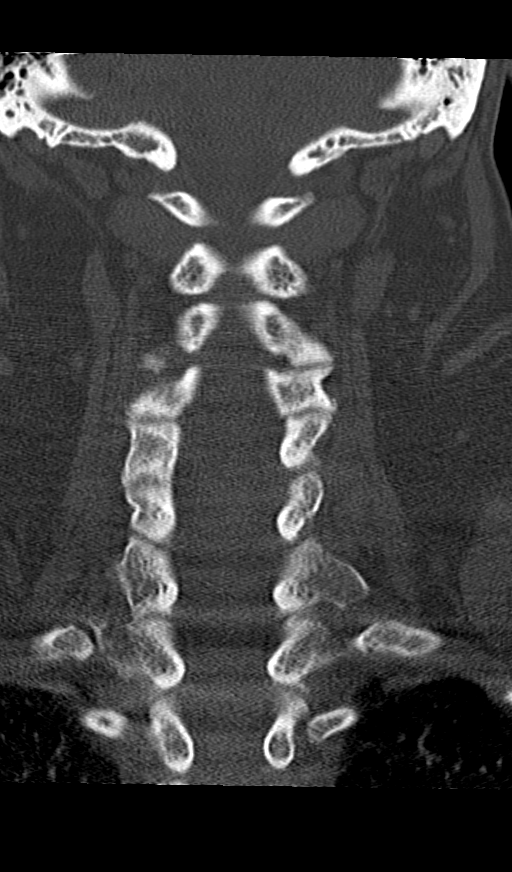
[im 37/62  bone]
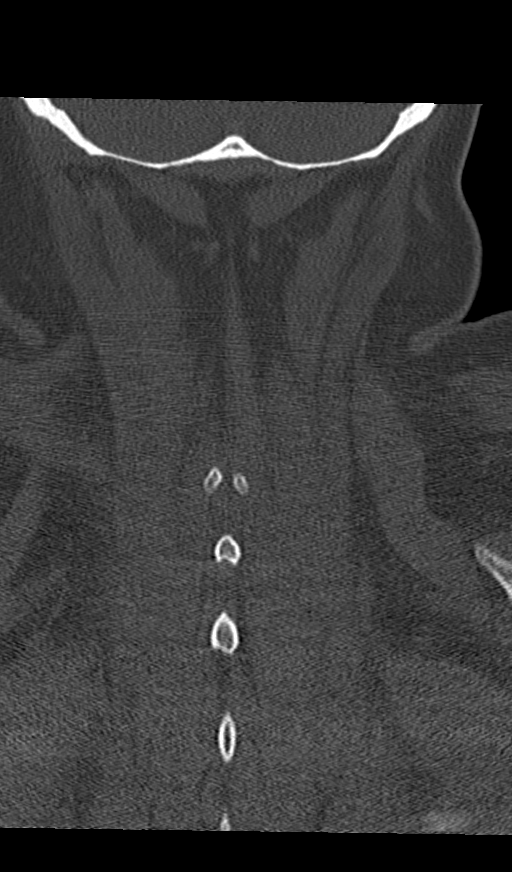

[Series 9: orthogonal bone · axial · 0.24mm/px · z∈[-283,-174]mm · 3 of 107 slices shown, 4 images]
[im 31/107  soft-tissue]
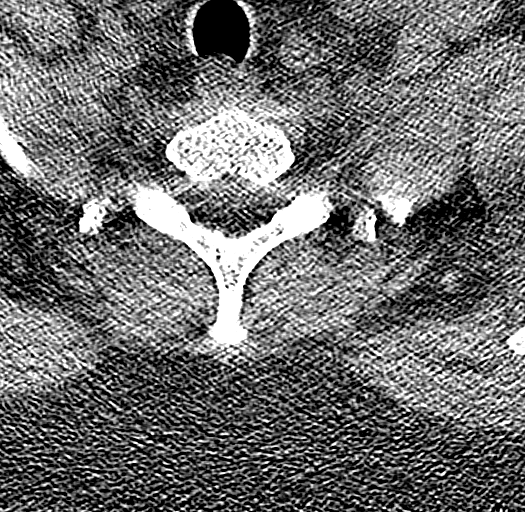
[im 31/107  bone]
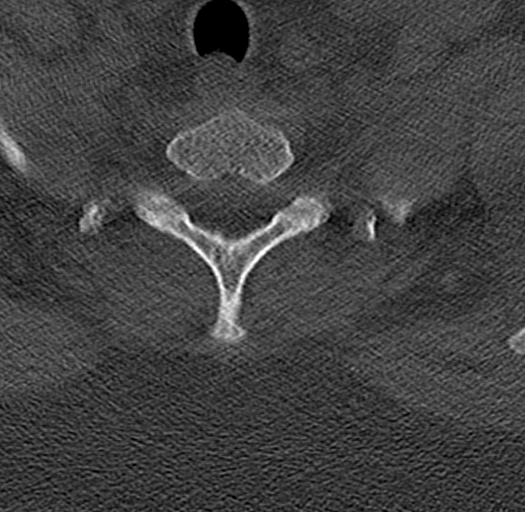
[im 61/107  bone]
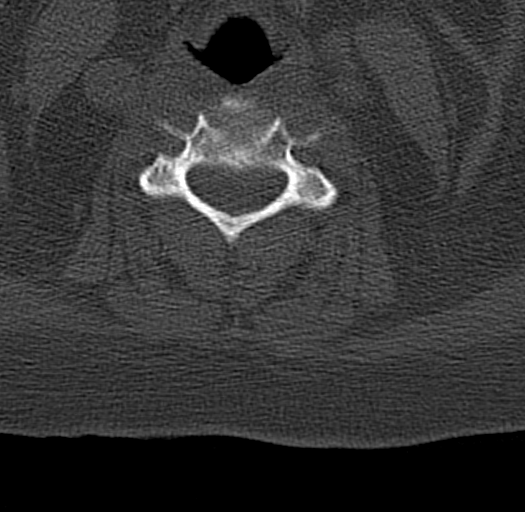
[im 91/107  bone]
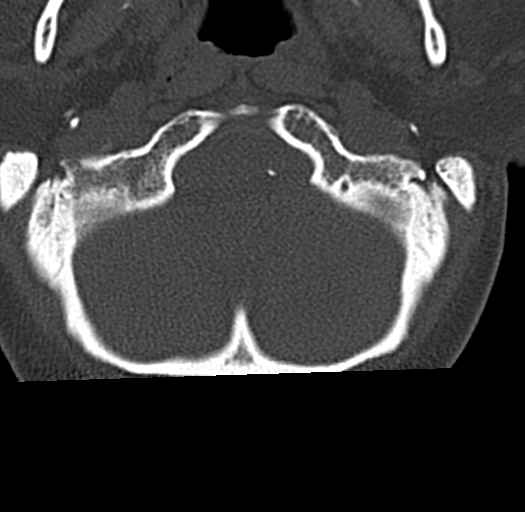

[11 of 33 positions shown; findings below may reference images not displayed]

FINDINGS: CT HEAD FINDINGS

Brain: No evidence of acute infarction, hemorrhage, hydrocephalus,
extra-axial collection or mass lesion/mass effect.

Vascular: Negative

Skull: Negative for fracture

Sinuses/Orbits: No evidence of injury

CT CERVICAL SPINE FINDINGS

Alignment: Normal.

Skull base and vertebrae: No acute fracture. No primary bone lesion
or focal pathologic process.

Soft tissues and spinal canal: No prevertebral fluid or swelling. No
visible canal hematoma.

Disc levels:  No evidence of impingement
IMPRESSION: No evidence of intracranial or cervical spine injury.

## 2019-10-24 IMAGING — CT CT L SPINE W/O CM
3 of 9 series · 14 of 33 positions shown, 17 images · IV contrast (agent unspecified)
Comparison: None

CLINICAL DATA: Multiple falls and confusion.

EXAM:
CT Thoracic and Lumbar spine with contrast
TECHNIQUE: Multiplanar CT images of the thoracic and lumbar spine were
reconstructed from contemporary CT of the Chest, Abdomen, and Pelvis
CONTRAST:  None addition

[Series 503: lspine ax st · axial · 0.45mm/px · z∈[-700,-558]mm · 6 of 101 slices shown, 8 images]
[im 15/101  soft-tissue]
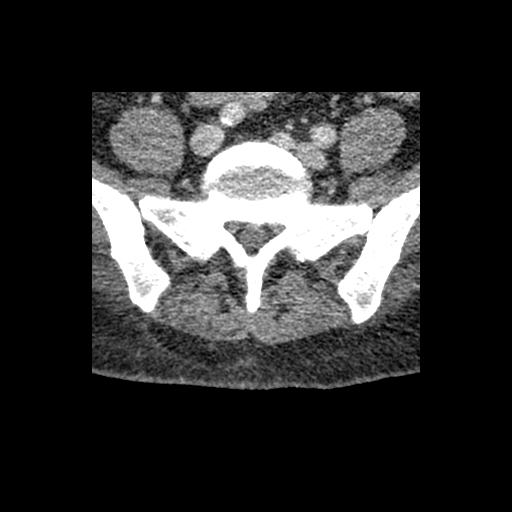
[im 15/101  bone]
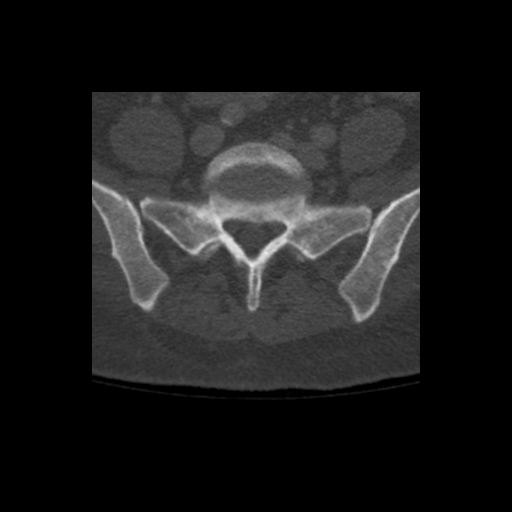
[im 29/101  bone]
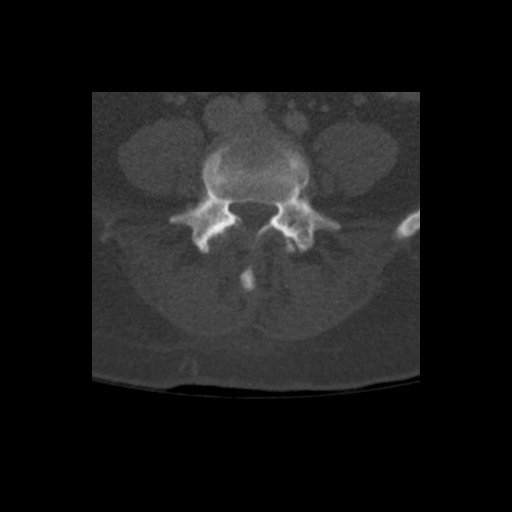
[im 43/101  bone]
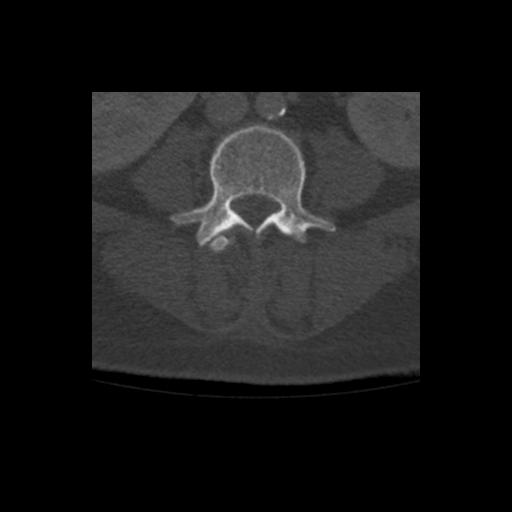
[im 58/101  bone]
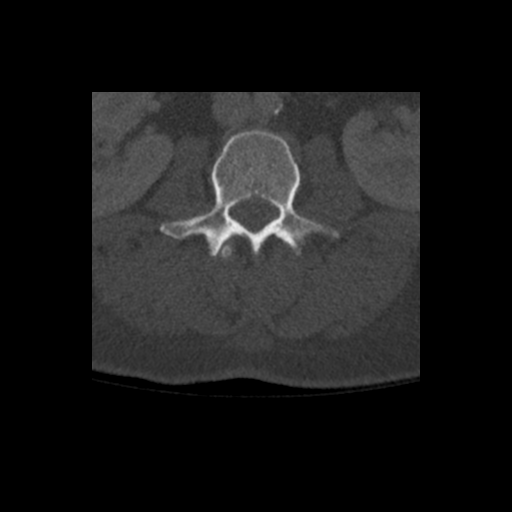
[im 72/101  soft-tissue]
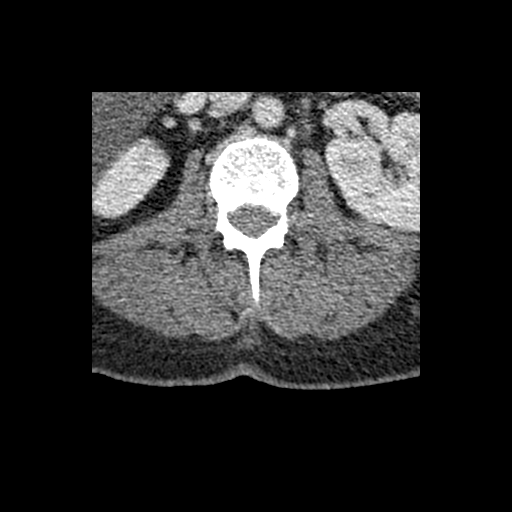
[im 72/101  bone]
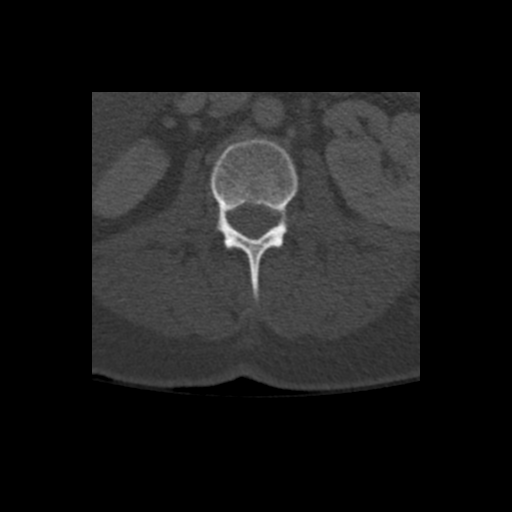
[im 86/101  bone]
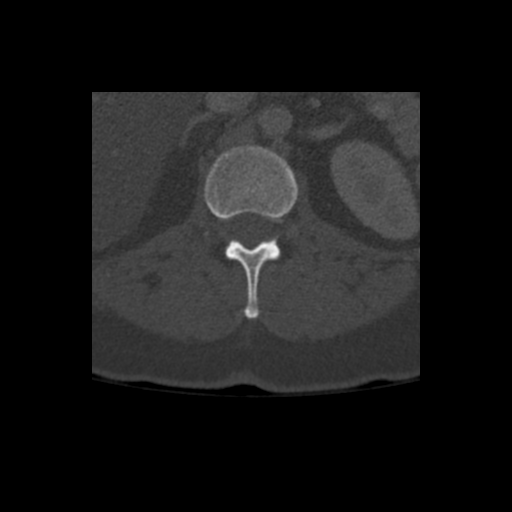

[Series 511: lspine cor bone · coronal · 0.45mm/px · 3 of 56 slices shown]
[im 12/56  bone]
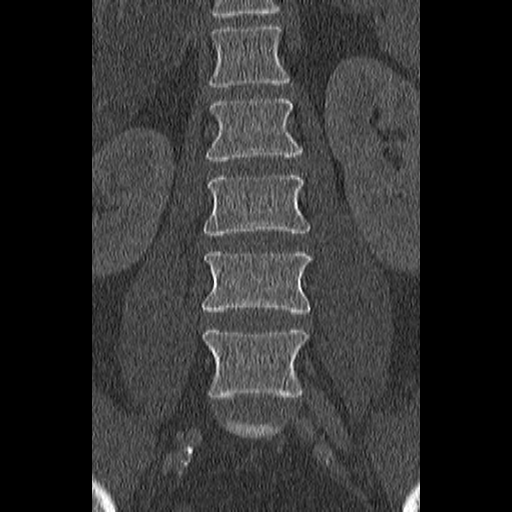
[im 23/56  bone]
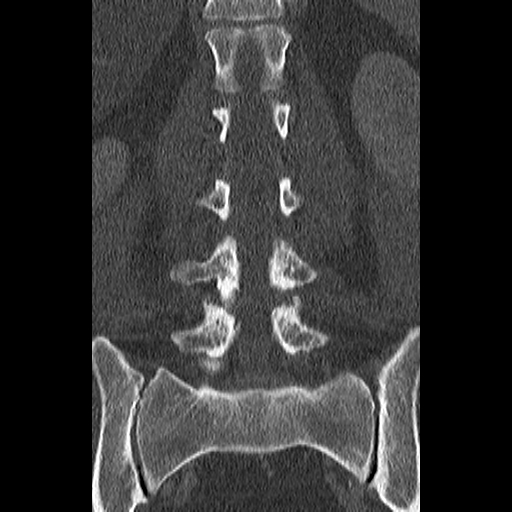
[im 34/56  bone]
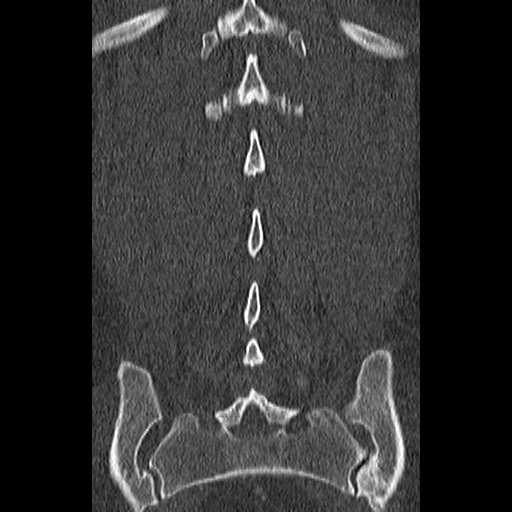

[Series 512: lspine sag bone · sagittal · 0.45mm/px · 5 of 43 slices shown, 6 images]
[im 15/43  bone]
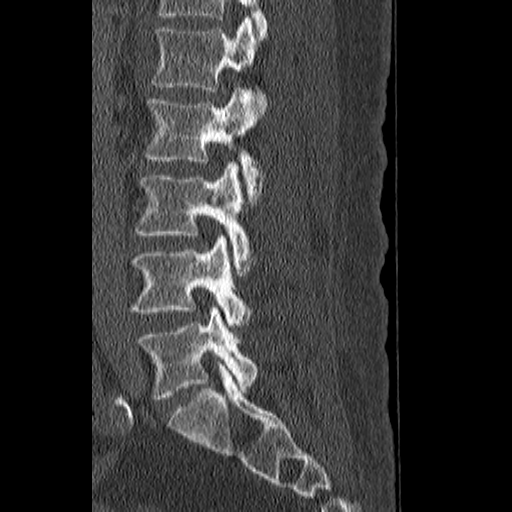
[im 18/43  bone]
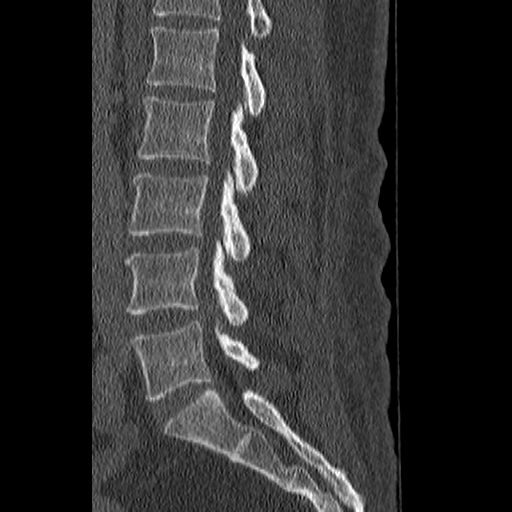
[im 22/43  soft-tissue]
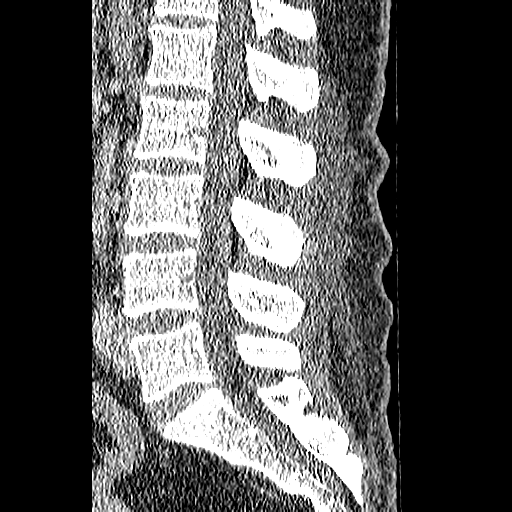
[im 22/43  bone]
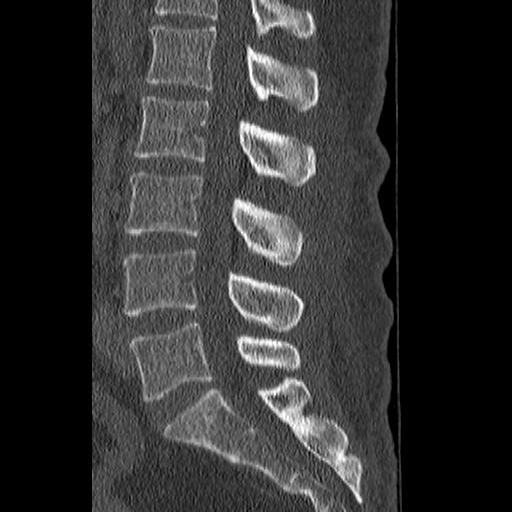
[im 25/43  bone]
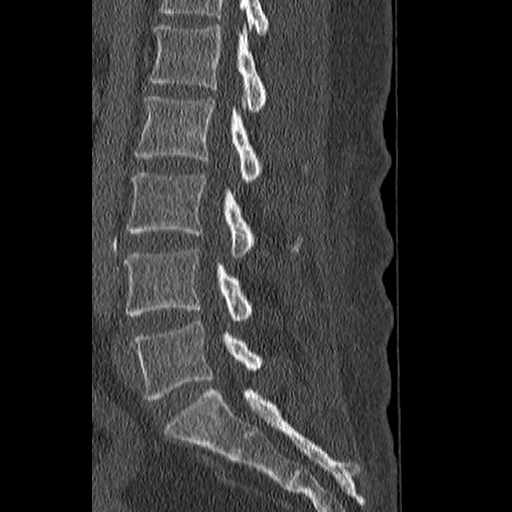
[im 29/43  bone]
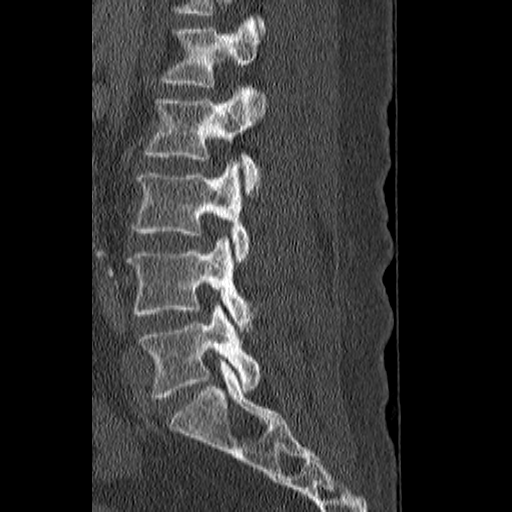

[14 of 33 positions shown; findings below may reference images not displayed]

FINDINGS: CT THORACIC SPINE FINDINGS

Alignment: Normal

Vertebrae: T3 and T4 superior endplate concavities without cortex
deformity. No fracture line is seen throughout the thoracic spine.

Paraspinal and other soft tissues: Reported separately. No
perispinal hematoma.

Disc levels: Negative for degenerative changes or visible cord
impingement

CT LUMBAR SPINE FINDINGS

Segmentation: 5 lumbar type vertebrae

Alignment: Physiologic

Vertebrae: No acute fracture or incidental bone lesion. Sacroiliac
osteoarthritis with sclerosis and spurring.

Paraspinal and other soft tissues: No evidence of swelling or
hematoma.

Disc levels: Mild degenerative facet spurring at L3-4 and L4-5.
IMPRESSION: No evidence of acute injury to the thoracic or lumbar spine.

## 2019-10-24 IMAGING — CT CT HEAD W/O CM
3 series · 16 of 46 positions shown, 19 images · non-contrast
Comparison: [DATE] head CT

CLINICAL DATA: Confusion and falls.  History of alcohol abuse.

EXAM:
CT HEAD WITHOUT CONTRAST
CT CERVICAL SPINE WITHOUT CONTRAST
TECHNIQUE: Multidetector CT imaging of the head and cervical spine was
performed following the standard protocol without intravenous
contrast. Multiplanar CT image reconstructions of the cervical spine
were also generated.

[Series 2: head wo · axial · 0.43mm/px · z∈[-139,-19]mm · 10 of 29 slices shown, 13 images]
[im 3/29  brain]
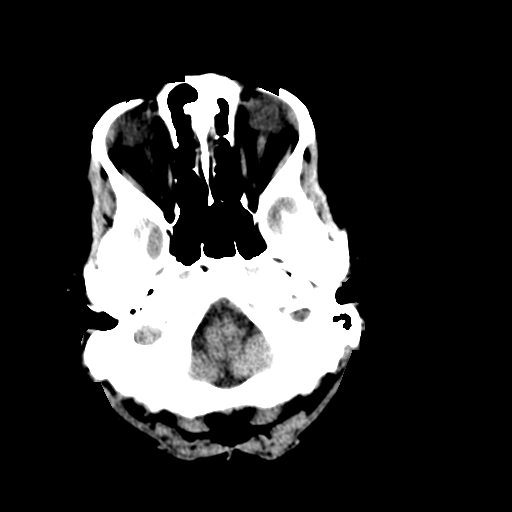
[im 3/29  bone]
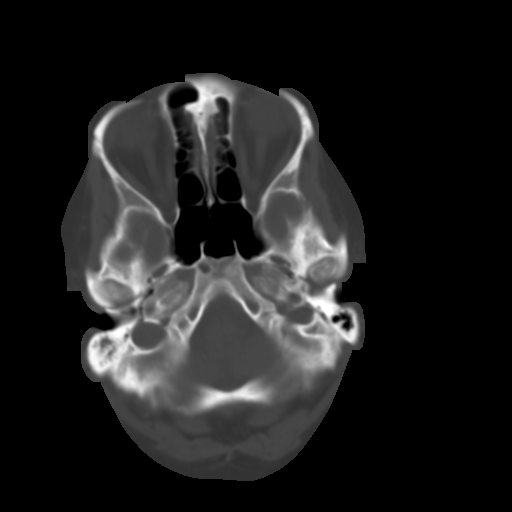
[im 6/29  brain]
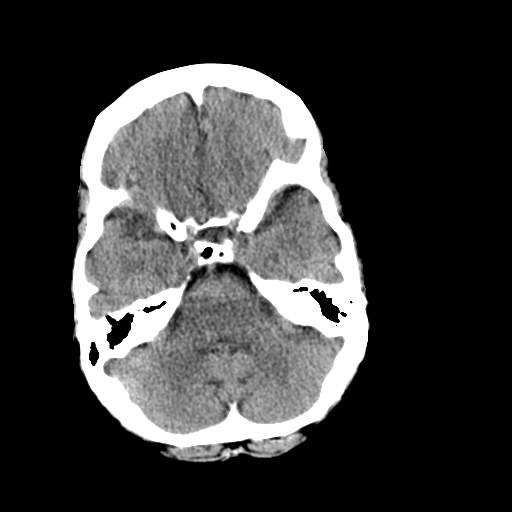
[im 8/29  brain]
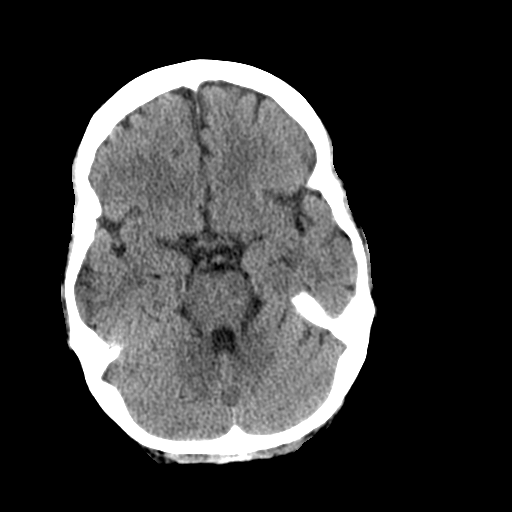
[im 11/29  brain]
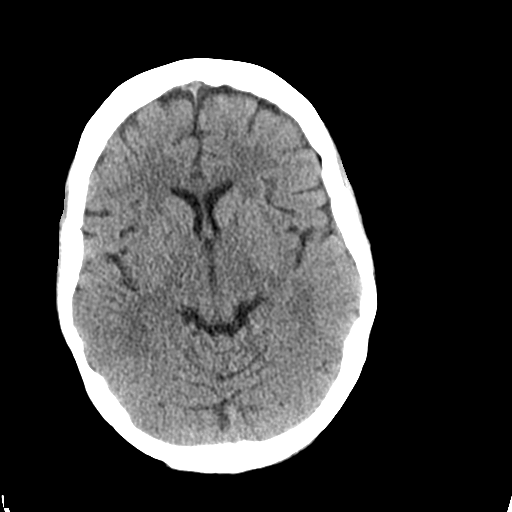
[im 14/29  brain]
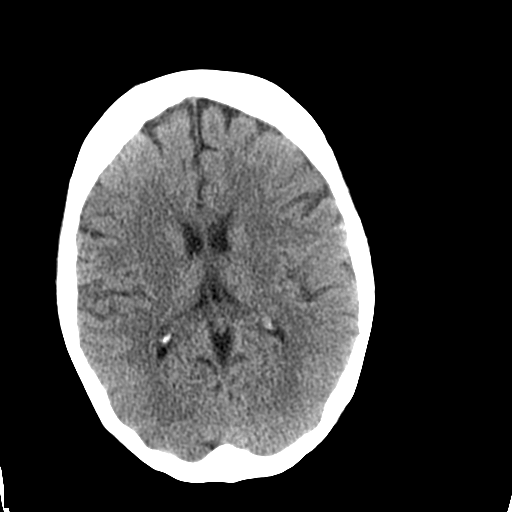
[im 14/29  bone]
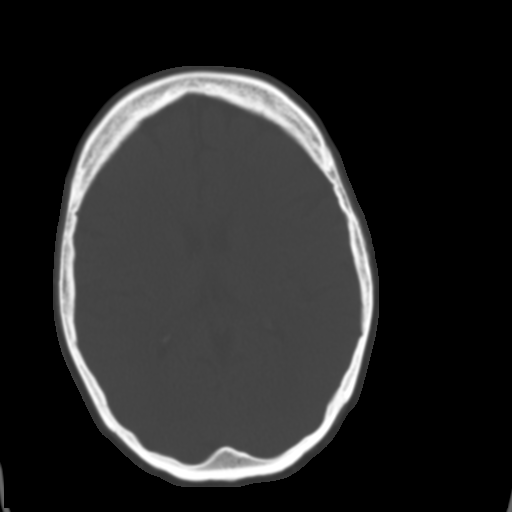
[im 16/29  brain]
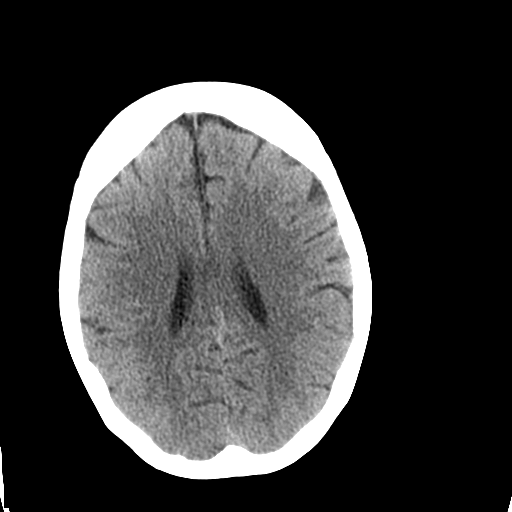
[im 19/29  brain]
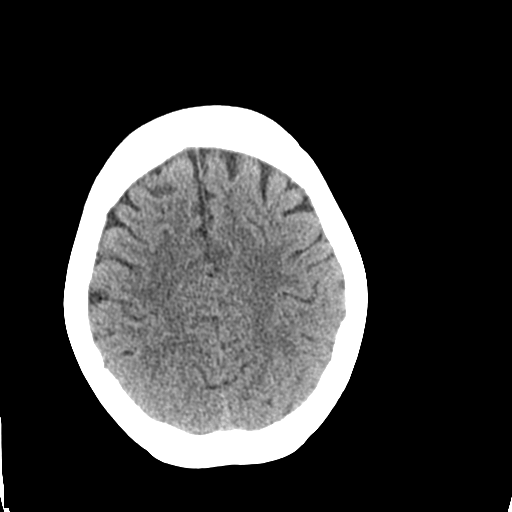
[im 22/29  brain]
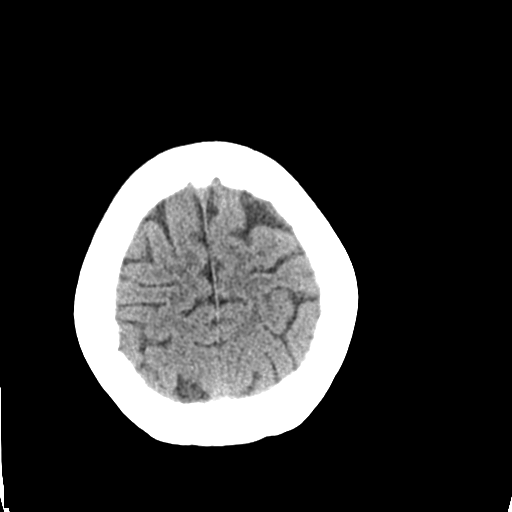
[im 24/29  brain]
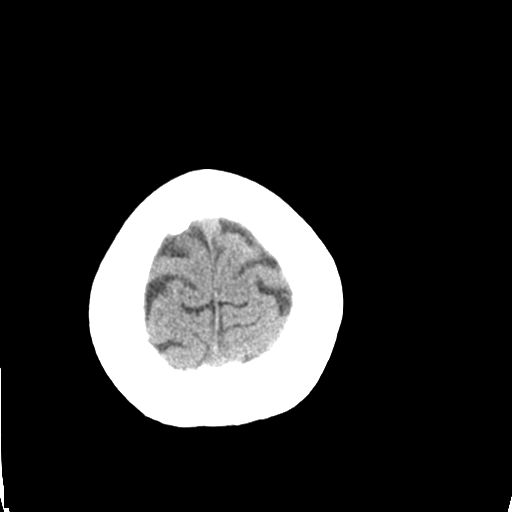
[im 24/29  bone]
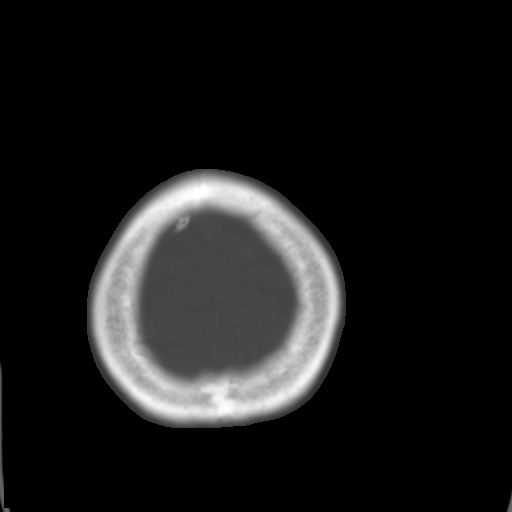
[im 27/29  brain]
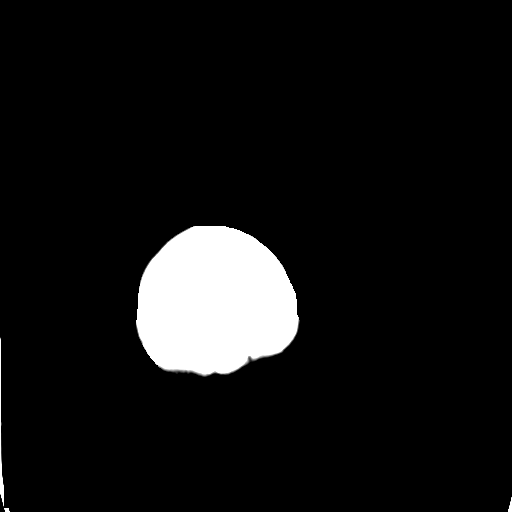

[Series 4: coronal soft tissue · coronal · 0.31mm/px · 3 of 64 slices shown]
[im 22/64  brain]
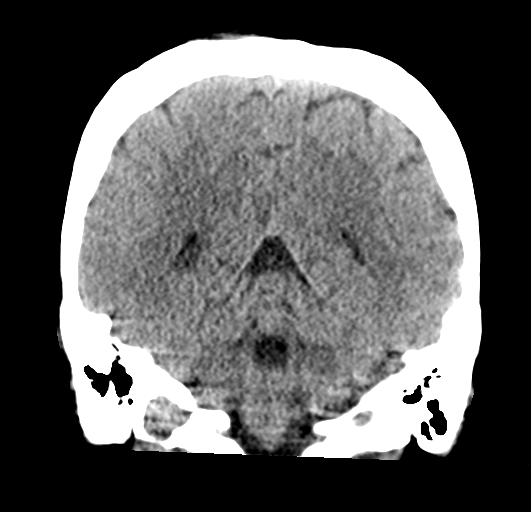
[im 29/64  brain]
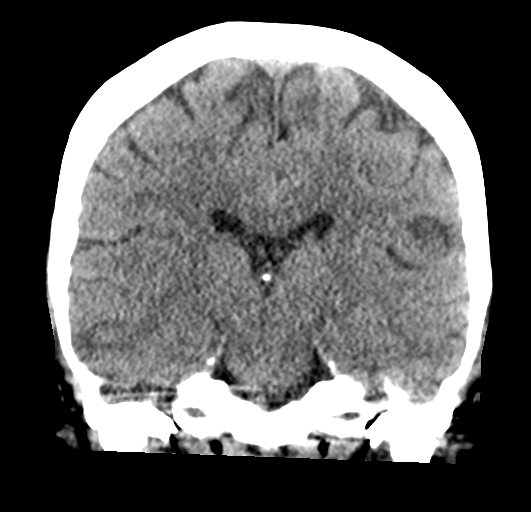
[im 36/64  brain]
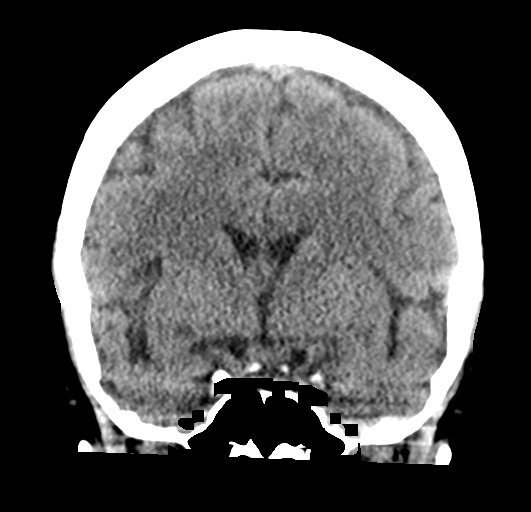

[Series 5: sagittal soft tissue · sagittal · 0.31mm/px · 3 of 56 slices shown]
[im 19/56  brain]
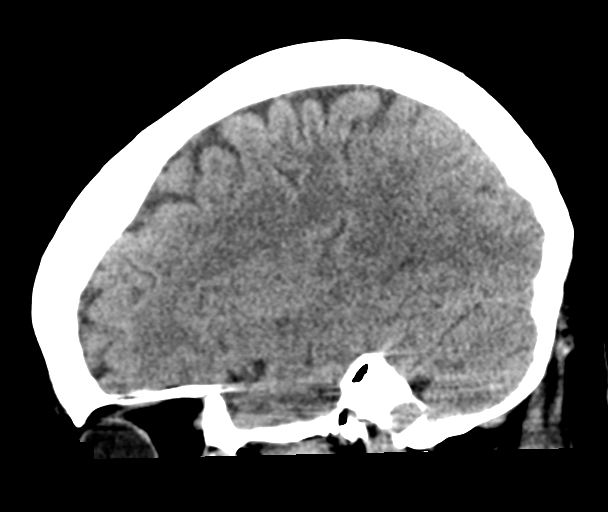
[im 28/56  brain]
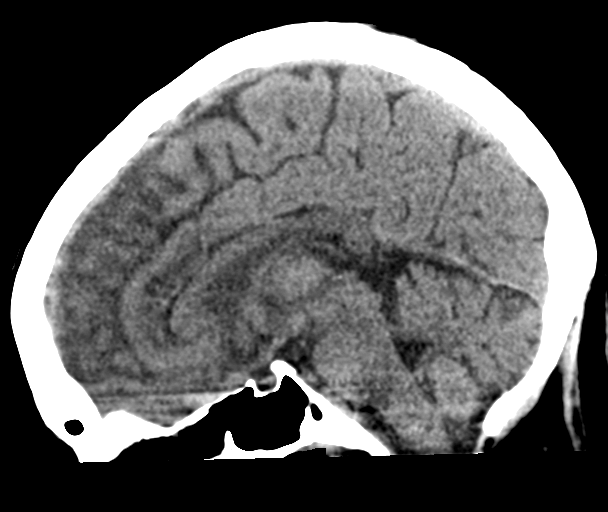
[im 37/56  brain]
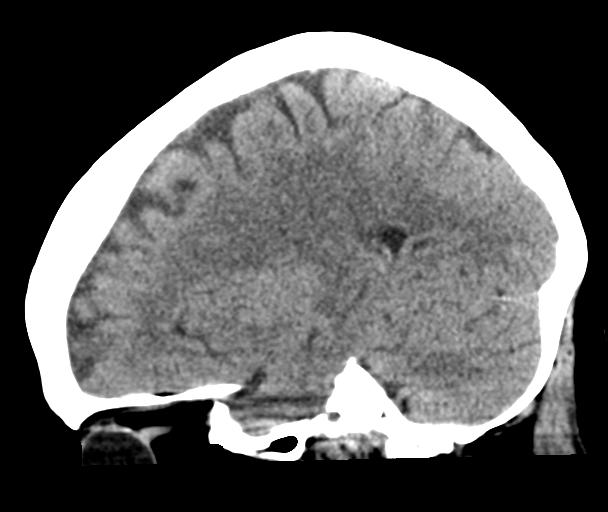

[16 of 46 positions shown; findings below may reference images not displayed]

FINDINGS: CT HEAD FINDINGS

Brain: No evidence of acute infarction, hemorrhage, hydrocephalus,
extra-axial collection or mass lesion/mass effect.

Vascular: Negative

Skull: Negative for fracture

Sinuses/Orbits: No evidence of injury

CT CERVICAL SPINE FINDINGS

Alignment: Normal.

Skull base and vertebrae: No acute fracture. No primary bone lesion
or focal pathologic process.

Soft tissues and spinal canal: No prevertebral fluid or swelling. No
visible canal hematoma.

Disc levels:  No evidence of impingement
IMPRESSION: No evidence of intracranial or cervical spine injury.

## 2019-10-24 IMAGING — CT CT T SPINE W/O CM
4 series · 16 of 33 positions shown, 19 images · IV contrast (agent unspecified)
Comparison: None

CLINICAL DATA: Multiple falls and confusion.

EXAM:
CT Thoracic and Lumbar spine with contrast
TECHNIQUE: Multiplanar CT images of the thoracic and lumbar spine were
reconstructed from contemporary CT of the Chest, Abdomen, and Pelvis
CONTRAST:  None addition

[Series 503: tspine st ax · axial · 0.64mm/px · z∈[-525,-309]mm · 5 of 162 slices shown, 7 images]
[im 27/162  soft-tissue]
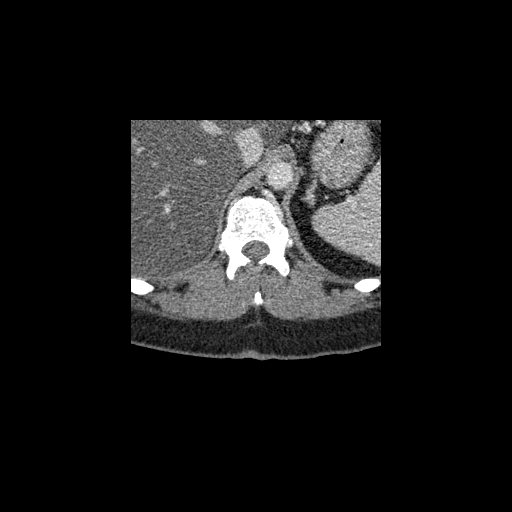
[im 27/162  bone]
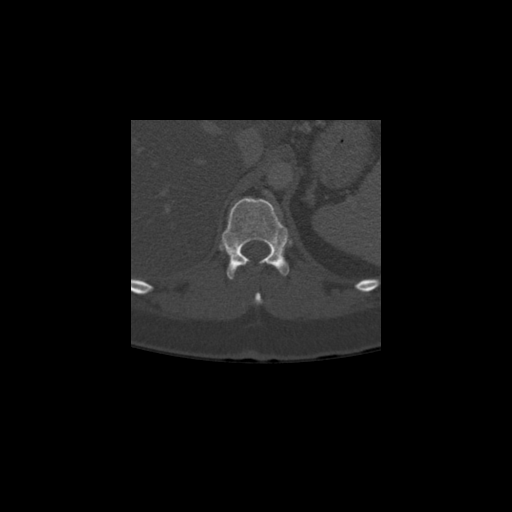
[im 54/162  bone]
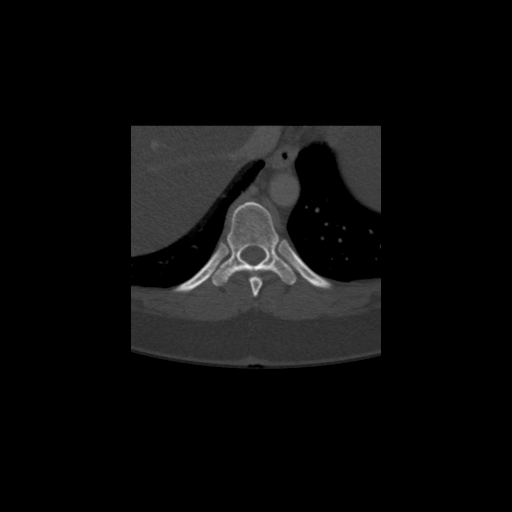
[im 81/162  bone]
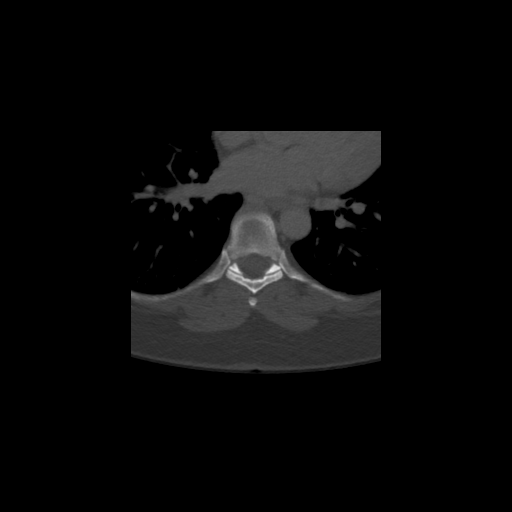
[im 108/162  bone]
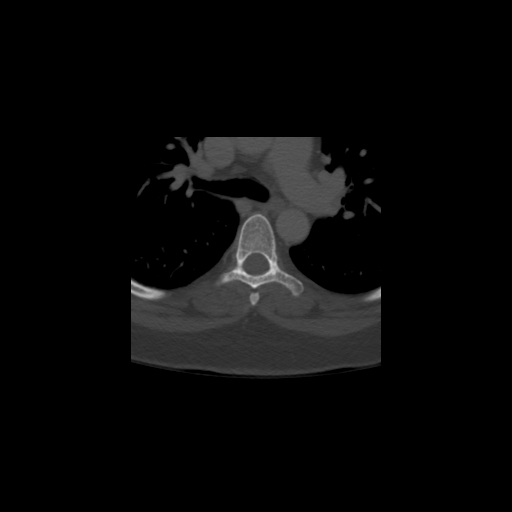
[im 135/162  soft-tissue]
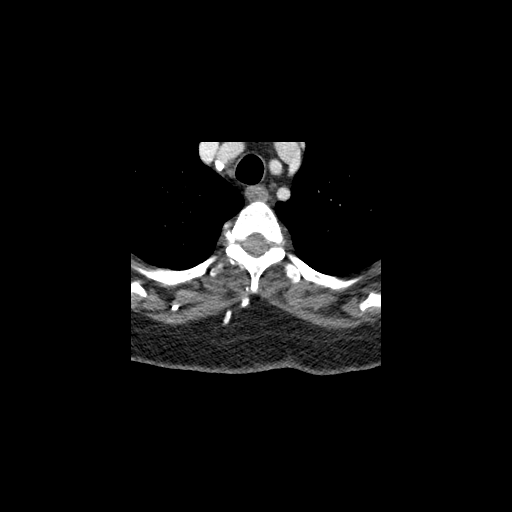
[im 135/162  bone]
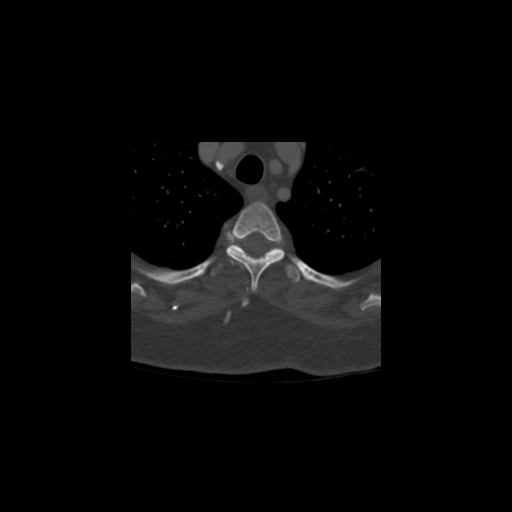

[Series 504: tspine cor bone · coronal · 0.64mm/px · 3 of 53 slices shown]
[im 11/53  bone]
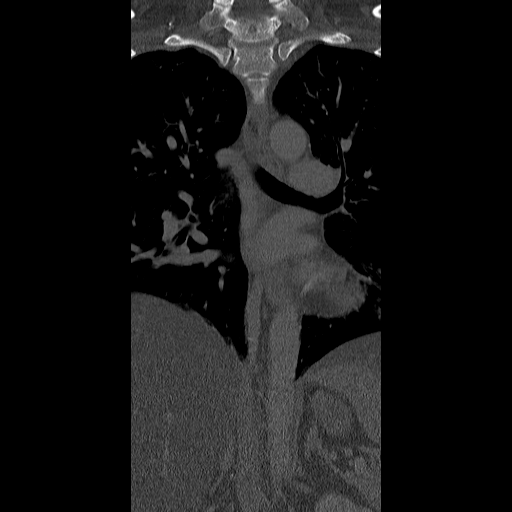
[im 21/53  bone]
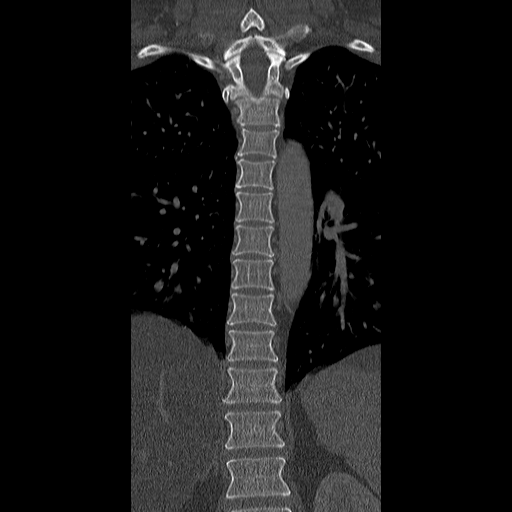
[im 32/53  bone]
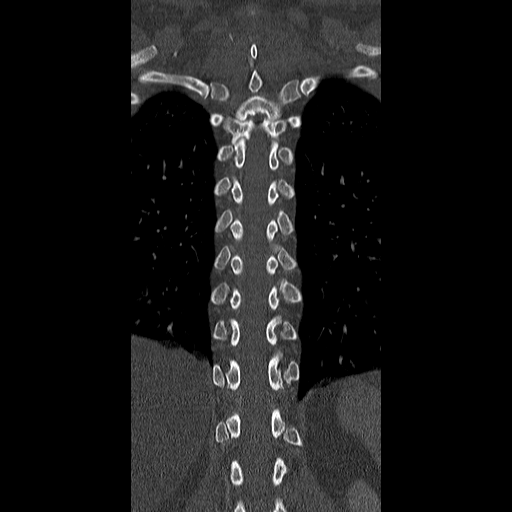

[Series 505: tspine bone ax · axial · 0.64mm/px · z∈[-519,-414]mm · 3 of 157 slices shown]
[im 27/157  bone]
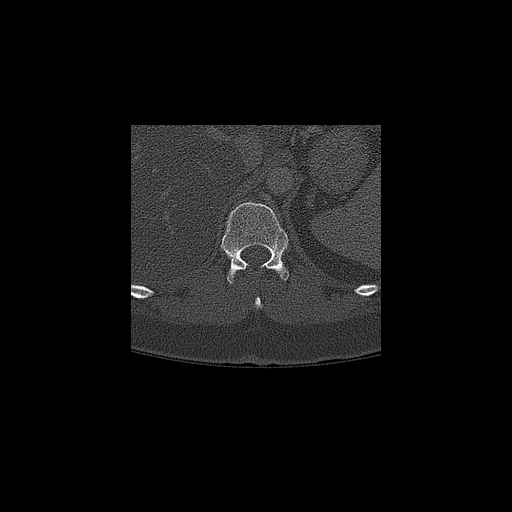
[im 53/157  bone]
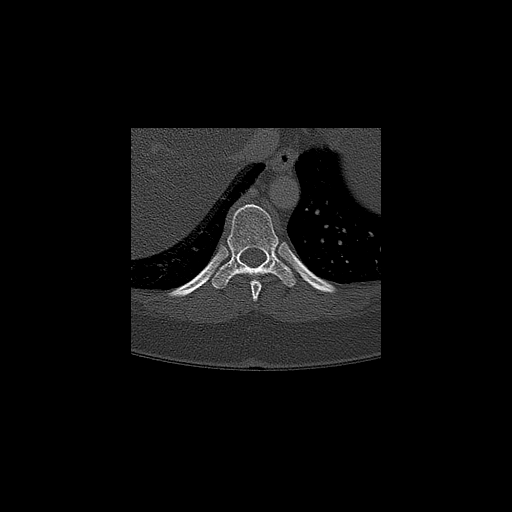
[im 79/157  bone]
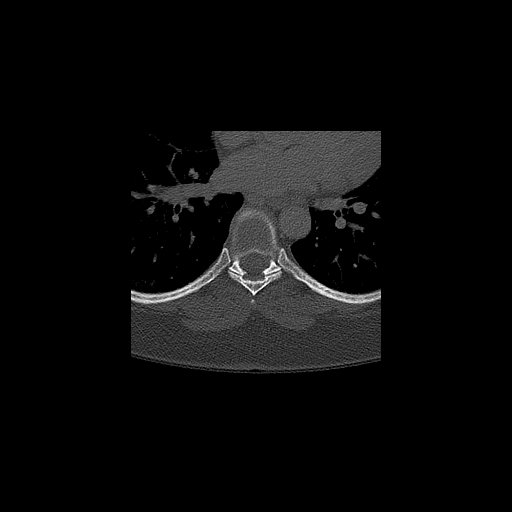

[Series 506: tspine sag bone · sagittal · 0.64mm/px · 5 of 43 slices shown, 6 images]
[im 15/43  bone]
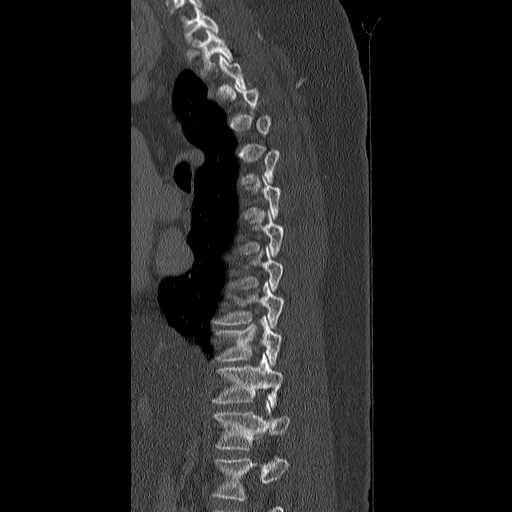
[im 18/43  bone]
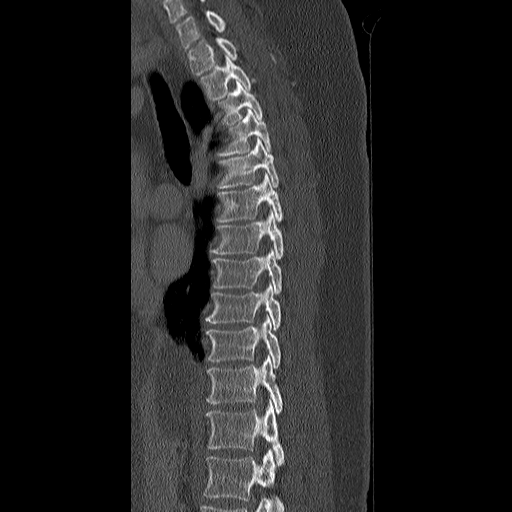
[im 22/43  soft-tissue]
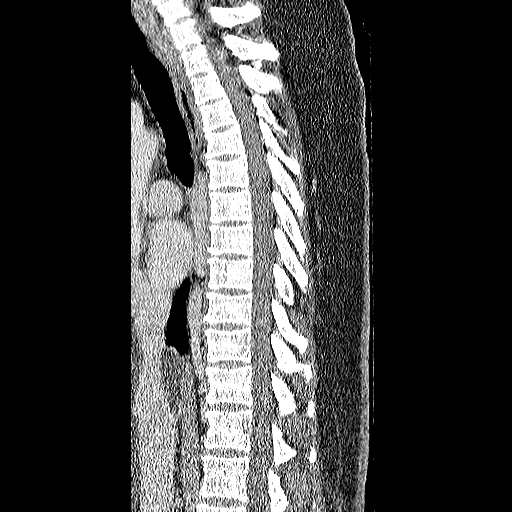
[im 22/43  bone]
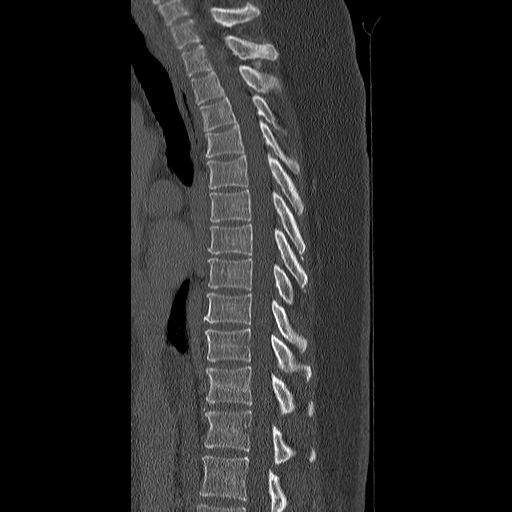
[im 25/43  bone]
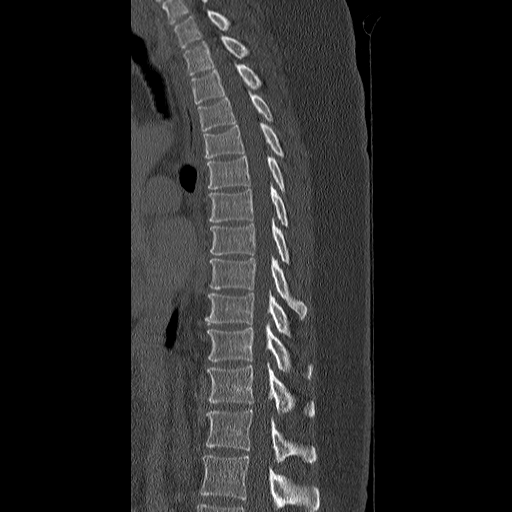
[im 29/43  bone]
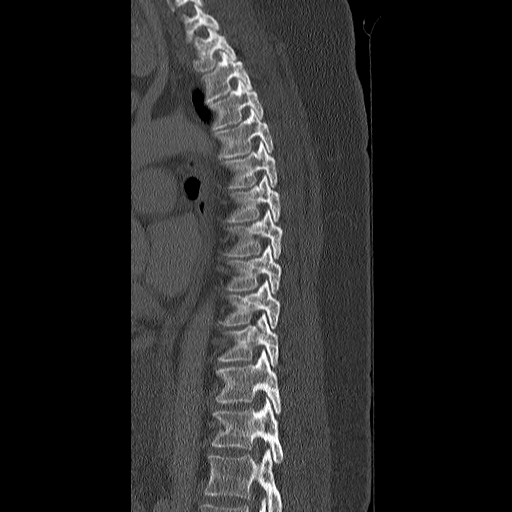

[16 of 33 positions shown; findings below may reference images not displayed]

FINDINGS: CT THORACIC SPINE FINDINGS

Alignment: Normal

Vertebrae: T3 and T4 superior endplate concavities without cortex
deformity. No fracture line is seen throughout the thoracic spine.

Paraspinal and other soft tissues: Reported separately. No
perispinal hematoma.

Disc levels: Negative for degenerative changes or visible cord
impingement

CT LUMBAR SPINE FINDINGS

Segmentation: 5 lumbar type vertebrae

Alignment: Physiologic

Vertebrae: No acute fracture or incidental bone lesion. Sacroiliac
osteoarthritis with sclerosis and spurring.

Paraspinal and other soft tissues: No evidence of swelling or
hematoma.

Disc levels: Mild degenerative facet spurring at L3-4 and L4-5.
IMPRESSION: No evidence of acute injury to the thoracic or lumbar spine.

## 2019-10-24 MED ORDER — IOHEXOL 300 MG/ML  SOLN
100.0000 mL | Freq: Once | INTRAMUSCULAR | Status: AC | PRN
  Administered 2019-10-24: 100 mL via INTRAVENOUS

## 2019-10-24 MED ORDER — POTASSIUM CHLORIDE CRYS ER 20 MEQ PO TBCR
40.0000 meq | EXTENDED_RELEASE_TABLET | Freq: Once | ORAL | Status: AC
  Administered 2019-10-24: 40 meq via ORAL
  Filled 2019-10-24: qty 2

## 2019-10-24 MED ORDER — LACTATED RINGERS IV BOLUS
1000.0000 mL | Freq: Once | INTRAVENOUS | Status: AC
  Administered 2019-10-24: 1000 mL via INTRAVENOUS

## 2019-10-24 NOTE — ED Triage Notes (Signed)
EMS pt to triage via wheelchair. Pt from home with report of numerous falls over the last day. Hx of cirrhosis and alcohol abuse. Pt appears jaundiced to her skin and eyes. Pt reports she bumped her head and thinks she may have passed out. Pt is messing with the IV this RN just placed and thinks it is a cigarette. Charge RN called for a bed.

## 2019-10-24 NOTE — ED Notes (Signed)
Date and time results received: 10/24/19 0720   Test: Lactic Acid Critical Value: 3.4  Name of Provider Notified: Dr. Katrinka Blazing  Orders Received? Or Actions Taken?: Acknowledged. LR to be hung when patient returns from CT.

## 2019-10-24 NOTE — ED Notes (Signed)
EDP Dr Katrinka Blazing informed of critical alcohol level of 309.

## 2019-10-24 NOTE — ED Provider Notes (Signed)
CT scans are reassuring, repeat troponin has decreased, repeat lactic has decreased.  She feels well and is anxious to go home no indication for admission at this time   Jene Every, MD 10/24/19 [phone]

## 2019-10-24 NOTE — ED Provider Notes (Signed)
[facility] Emergency Department Provider Note  ____________________________________________   First MD Initiated Contact with Patient 10/24/19 [phone]     (approximate)  I have reviewed the triage vital signs and the nursing notes.   HISTORY  Chief Complaint Fall and Hypotension   HPI Ashlee Hawkins is a 39 y.o. female with a past medical history of HTN, bipolar disorder, PTSD, seizure disorder, and alcohol abuse who presents for assessment of confusion as well as several falls that occurred earlier today in the setting of relapsing with drinking alcohol.  Patient states she has been a daily drinker for a long time but had quit for several days and then started drinking again today and had at least 5 shots of liquor.  Patient states that she fell and hit her head but is not sure if she tripped or slipped or fell because she was intoxicated.  She endorses some nausea, chest pain, diarrhea, and abdominal pain.  Denies any current headache, vision changes, extremity pain, rash, recent fevers, cough, dysuria, diarrhea, or other acute symptoms.  She denies taking any illegal drugs.  Denies being on a blood thinner.         Past Medical History:  Diagnosis Date  . Alcoholism (HCC)   . Bipolar 1 disorder (HCC)   . Hypertension   . Liver disease   . PTSD (post-traumatic stress disorder)   . Seizures [facility])     Patient Active Problem List   Diagnosis Date Noted  . Alcoholic hepatitis 10/12/2019  . Hyponatremia 10/12/2019    Past Surgical History:  Procedure Laterality Date  . CHOLECYSTECTOMY      Prior to Admission medications   Medication Sig Start Date End Date Taking? Authorizing Provider  amLODipine (NORVASC) 5 MG tablet Take 1 tablet (5 mg total) by mouth daily. 10/17/19 11/16/19  Marguerita Merles Latif, DO  clotrimazole (LOTRIMIN) 1 % external solution Apply 1 application topically 2 (two) times daily.    [provider]  diclofenac sodium  (VOLTAREN) 1 % GEL Apply 2 g topically 4 (four) times daily as needed (pain).    [provider]  diphenhydrAMINE (BENADRYL) 25 mg capsule Take 25 mg by mouth every 6 (six) hours as needed for itching or allergies.  05/28/16   [provider]  Docusate Sodium (DSS) 100 MG CAPS Take 100 mg by mouth daily.     [provider]  feeding supplement, ENSURE ENLIVE, (ENSURE ENLIVE) LIQD Take 237 mLs by mouth 2 (two) times daily between meals. 10/17/19   Marguerita Merles Latif, DO  folic acid (FOLVITE) 1 MG tablet Take 1 tablet (1 mg total) by mouth daily. 10/18/19   Marguerita Merles Latif, DO  gabapentin (NEURONTIN) 100 MG capsule Take 1 capsule (100 mg total) by mouth 3 (three) times daily. 10/17/19   Marguerita Merles Latif, DO  hydrOXYzine (ATARAX/VISTARIL) 50 MG tablet Take 50 mg by mouth 3 (three) times daily as needed.    [provider]  lactulose (CHRONULAC) 10 GM/15ML solution Take 30 mLs (20 g total) by mouth 2 (two) times daily as needed for mild constipation. 10/17/19   Marguerita Merles Latif, DO  lisinopril (ZESTRIL) 10 MG tablet Take 1 tablet (10 mg total) by mouth daily. 10/18/19   Marguerita Merles Latif, DO  Melatonin 3 MG TABS Take 3 mg by mouth at bedtime.    [provider]  Multiple Vitamin (MULTI-VITAMIN) tablet Take 1 tablet by mouth daily.    [provider]  nicotine polacrilex (COMMIT) 2 MG lozenge Place 2 mg inside cheek every 2 (two) hours as needed.     [provider]  omeprazole (PRILOSEC) 20 MG capsule Take 20 mg by mouth daily.     [provider]  ondansetron (ZOFRAN) 4 MG tablet Take 1 tablet (4 mg total) by mouth every 6 (six) hours as needed for nausea. 10/17/19   Marguerita MerlesSheikh, Omair Latif, DO  oxcarbazepine (TRILEPTAL) 600 MG tablet Take 1 tablet (600 mg total) by mouth 2 (two) times daily. 10/17/19   Sheikh, Omair Latif, DO  rifaximin (XIFAXAN) 550 MG TABS tablet Take 1 tablet (550 mg total) by mouth 2 (two) times daily. 10/17/19    Marguerita MerlesSheikh, Omair Latif, DO  sertraline (ZOLOFT) 50 MG tablet Take 1 tablet (50 mg total) by mouth daily. 10/18/19   Marguerita MerlesSheikh, Omair Latif, DO  thiamine 100 MG tablet Take 100 mg by mouth daily.     [provider]  traZODone (DESYREL) 50 MG tablet Take 50 mg by mouth at bedtime.     [provider]  zinc gluconate 50 MG tablet Take 50 mg by mouth daily.     [provider]    Allergies Patient has no known allergies.  Family History  Problem Relation Age of Onset  . Heart failure Mother     Social History Social History   Tobacco Use  . Smoking status: Current Every Day Smoker    Packs/day: 1.00    Types: Cigarettes  . Smokeless tobacco: Never Used  Vaping Use  . Vaping Use: Never used  Substance Use Topics  . Alcohol use: Yes    Alcohol/week: 36.0 standard drinks    Types: 36 Shots of liquor per week  . Drug use: Not Currently    Types: Cocaine, Marijuana    Review of Systems  Review of Systems  Constitutional: Negative for chills and fever.  HENT: Negative for sore throat.   Eyes: Negative for pain.  Respiratory: Negative for cough and stridor.   Cardiovascular: Positive for chest pain.  Gastrointestinal: Positive for abdominal pain, diarrhea and nausea. Negative for vomiting.  Genitourinary: Negative for dysuria.  Musculoskeletal: Negative for myalgias.  Skin: Negative for rash.  Neurological: Negative for seizures, loss of consciousness and headaches.  Psychiatric/Behavioral: Negative for suicidal ideas.  All other systems reviewed and are negative.     ____________________________________________   PHYSICAL EXAM:  VITAL SIGNS: ED Triage Vitals  Enc Vitals Group     BP 10/24/19 0524 90/60     Pulse Rate 10/24/19 0524 84     Resp 10/24/19 0524 18     Temp 10/24/19 0524 97.8 F (36.6 C)     Temp Source 10/24/19 0524 Oral     SpO2 10/24/19 0524 98 %     Weight 10/24/19 0546 181 lb (82.1 kg)     Height 10/24/19 0546 5\' 3"  (1.6  m)     Head Circumference --      Peak Flow --      Pain Score 10/24/19 0546 0     Pain Loc --      Pain Edu? --      Excl. in GC? --    Vitals:   10/24/19 0524  BP: 90/60  Pulse: 84  Resp: 18  Temp: 97.8 F (36.6 C)  SpO2: 98%   Physical Exam Vitals and nursing note reviewed.  Constitutional:      General: She is not in acute distress.  Appearance: She is well-developed.  HENT:     Head: Normocephalic and atraumatic.     Right Ear: External ear normal.     Left Ear: External ear normal.     Nose: Nose normal.  Eyes:     General: Scleral icterus present.     Conjunctiva/sclera: Conjunctivae normal.  Cardiovascular:     Rate and Rhythm: Normal rate and regular rhythm.     Heart sounds: No murmur heard.   Pulmonary:     Effort: Pulmonary effort is normal. No respiratory distress.     Breath sounds: Normal breath sounds.  Abdominal:     Palpations: Abdomen is soft.     Tenderness: There is no abdominal tenderness.  Musculoskeletal:     Cervical back: Neck supple.     Right lower leg: No edema.     Left lower leg: No edema.  Skin:    General: Skin is warm and dry.     Capillary Refill: Capillary refill takes 2 to 3 seconds.     Coloration: Skin is jaundiced.  Neurological:     Mental Status: She is alert. She is disoriented and confused.     Patient is able to give this examiner thumbs up with both hands and move both feet on command.  Sensation is intact to light touch all extremities.  PERRLA.  EOMI.  Patient does not have a tremor.  No point tenderness over the C/T/L-spine. 2+ bilateral radial and DP pulses. No significant effusion, tenderness, or overlying skin changes over the bilateral shoulders, elbows, wrists, knees, or ankles. ____________________________________________   LABS (all labs ordered are listed, but only abnormal results are displayed)  Labs Reviewed  COMPREHENSIVE METABOLIC PANEL - Abnormal; Notable for the following components:       Result Value   Sodium 130 (*)    Potassium 3.3 (*)    CO2 19 (*)    Glucose, Bld 109 (*)    BUN <5 (*)    Creatinine, Ser <0.30 (*)    Calcium 8.1 (*)    Albumin 2.9 (*)    AST 305 (*)    ALT 112 (*)    Alkaline Phosphatase 230 (*)    Total Bilirubin 8.0 (*)    All other components within normal limits  CBC - Abnormal; Notable for the following components:   WBC 11.6 (*)    RBC 2.37 (*)    Hemoglobin 9.2 (*)    HCT 29.7 (*)    MCV 125.3 (*)    MCH 38.8 (*)    RDW 19.6 (*)    nRBC 0.3 (*)    All other components within normal limits  ETHANOL - Abnormal; Notable for the following components:   Alcohol, Ethyl (B) 309 (*)    All other components within normal limits  LACTIC ACID, PLASMA - Abnormal; Notable for the following components:   Lactic Acid, Venous 3.4 (*)    All other components within normal limits  TROPONIN I (HIGH SENSITIVITY) - Abnormal; Notable for the following components:   Troponin I (High Sensitivity) 20 (*)    All other components within normal limits  SARS CORONAVIRUS 2 BY RT PCR (HOSPITAL ORDER, PERFORMED IN [facility])  MAGNESIUM  URINE DRUG SCREEN, QUALITATIVE ([facility] ONLY)  PREGNANCY, URINE  PROTIME-INR  AMMONIA  LACTIC ACID, PLASMA  LACTIC ACID, PLASMA  SAMPLE TO BLOOD BANK   ____________________________________________  ____________________________________________  RADIOLOGY  ED MD interpretation: No evidence of rib fracture, pneumothorax, edema,  effusion, or focal consolidation.  Official radiology report(s): DG Chest Port 1 View  Result Date: 10/24/2019 CLINICAL DATA:  Fall, jaundice, chest injury EXAM: PORTABLE CHEST 1 VIEW COMPARISON:  02/27/2016 FINDINGS: The heart size and mediastinal contours are within normal limits. Both lungs are clear. The visualized skeletal structures are unremarkable. IMPRESSION: No active disease. Electronically Signed   By: Helyn Numbers MD   On: 10/24/2019 06:31     ____________________________________________   PROCEDURES  Procedure(s) performed (including Critical Care):  .1-3 Lead EKG Interpretation Performed by: Gilles Chiquito, MD Authorized by: Gilles Chiquito, MD     Interpretation: normal     ECG rate assessment: normal     Rhythm: sinus rhythm     Ectopy: none     Conduction: normal       ____________________________________________   INITIAL IMPRESSION / ASSESSMENT AND PLAN / ED COURSE        Patient presents with above to history exam after reportedly drinking significant mount of alcohol today and falling several times per her and her husband. Patient states she hit her head and is not sure if she had LOC. She denies any other acute physical pain from her fall but does endorse some chest pain nausea, diarrhea, abdominal pain. Patient is confused but otherwise has a nonfocal neuro exam. No obvious trauma to the patient's extremities, chest, abdomen, back, or oropharynx.  Chest x-ray obtained immediately on patient presentation does not show evidence of acute thoracic injury. Initial labs remarkable for significant elevated EtOH, hyponatremia, hypokalemia, anemia which appears at baseline when compared to prior as well as elevated bilirubin and transaminitis which all appear very similar to labs obtained during patient's recent hospitalization with discharge on 9/13.  No obvious foci of infection on exam. [name] lactic acid is significantly elevated she is not obviously septic and this could very likely be related to dehydration and chronic liver failure with decreased ability to metabolize lactic acid..  Care patient signed over to oncoming provider at approximately [phone]. Please see their note for further details regarding reassessment and remainder of work-up. In brief at time of signout patient is pending CT head, C-spine, chest, abdomen, and pelvis as well as EKG, repeat lactic acid, ammonia, and urine  studies.  ____________________________________________   FINAL CLINICAL IMPRESSION(S) / ED DIAGNOSES  Final diagnoses:  Fall  Blood alcohol level of 240 mg/100 ml or more  Transaminitis  Hypokalemia  High bilirubin    Medications  lactated ringers bolus 1,000 mL (has no administration in time range)  potassium chloride SA (KLOR-CON) CR tablet 40 mEq (has no administration in time range)  iohexol (OMNIPAQUE) 300 MG/ML solution 100 mL (100 mLs Intravenous Contrast Given 10/24/19 0717)     ED Discharge Orders    None       Note:  This document was prepared using Dragon voice recognition software and may include unintentional dictation errors.   Gilles Chiquito, MD 10/24/19 [phone]

## 2019-11-21 ENCOUNTER — Encounter: Payer: Self-pay | Admitting: Emergency Medicine

## 2019-11-21 ENCOUNTER — Emergency Department: Payer: No Typology Code available for payment source

## 2019-11-21 ENCOUNTER — Inpatient Hospital Stay
Admission: EM | Admit: 2019-11-21 | Discharge: 2020-01-04 | DRG: 640 | Disposition: E | Payer: No Typology Code available for payment source | Attending: Hospitalist | Admitting: Hospitalist

## 2019-11-21 ENCOUNTER — Inpatient Hospital Stay: Payer: No Typology Code available for payment source

## 2019-11-21 ENCOUNTER — Other Ambulatory Visit: Payer: Self-pay

## 2019-11-21 DIAGNOSIS — Y905 Blood alcohol level of 100-119 mg/100 ml: Secondary | ICD-10-CM | POA: Diagnosis present

## 2019-11-21 DIAGNOSIS — K7031 Alcoholic cirrhosis of liver with ascites: Secondary | ICD-10-CM | POA: Diagnosis present

## 2019-11-21 DIAGNOSIS — E871 Hypo-osmolality and hyponatremia: Secondary | ICD-10-CM | POA: Diagnosis present

## 2019-11-21 DIAGNOSIS — G928 Other toxic encephalopathy: Secondary | ICD-10-CM | POA: Diagnosis present

## 2019-11-21 DIAGNOSIS — E861 Hypovolemia: Secondary | ICD-10-CM | POA: Diagnosis present

## 2019-11-21 DIAGNOSIS — E878 Other disorders of electrolyte and fluid balance, not elsewhere classified: Secondary | ICD-10-CM | POA: Diagnosis present

## 2019-11-21 DIAGNOSIS — R569 Unspecified convulsions: Secondary | ICD-10-CM | POA: Diagnosis not present

## 2019-11-21 DIAGNOSIS — E870 Hyperosmolality and hypernatremia: Secondary | ICD-10-CM | POA: Diagnosis not present

## 2019-11-21 DIAGNOSIS — K7290 Hepatic failure, unspecified without coma: Secondary | ICD-10-CM | POA: Diagnosis not present

## 2019-11-21 DIAGNOSIS — J9811 Atelectasis: Secondary | ICD-10-CM | POA: Diagnosis present

## 2019-11-21 DIAGNOSIS — E876 Hypokalemia: Secondary | ICD-10-CM | POA: Diagnosis present

## 2019-11-21 DIAGNOSIS — E43 Unspecified severe protein-calorie malnutrition: Secondary | ICD-10-CM | POA: Diagnosis present

## 2019-11-21 DIAGNOSIS — G40409 Other generalized epilepsy and epileptic syndromes, not intractable, without status epilepticus: Secondary | ICD-10-CM | POA: Diagnosis present

## 2019-11-21 DIAGNOSIS — D696 Thrombocytopenia, unspecified: Secondary | ICD-10-CM | POA: Diagnosis present

## 2019-11-21 DIAGNOSIS — K5730 Diverticulosis of large intestine without perforation or abscess without bleeding: Secondary | ICD-10-CM | POA: Diagnosis present

## 2019-11-21 DIAGNOSIS — J690 Pneumonitis due to inhalation of food and vomit: Secondary | ICD-10-CM | POA: Diagnosis not present

## 2019-11-21 DIAGNOSIS — K7011 Alcoholic hepatitis with ascites: Secondary | ICD-10-CM

## 2019-11-21 DIAGNOSIS — E440 Moderate protein-calorie malnutrition: Secondary | ICD-10-CM | POA: Insufficient documentation

## 2019-11-21 DIAGNOSIS — R791 Abnormal coagulation profile: Secondary | ICD-10-CM | POA: Diagnosis present

## 2019-11-21 DIAGNOSIS — I10 Essential (primary) hypertension: Secondary | ICD-10-CM | POA: Diagnosis present

## 2019-11-21 DIAGNOSIS — K7682 Hepatic encephalopathy: Secondary | ICD-10-CM

## 2019-11-21 DIAGNOSIS — K7010 Alcoholic hepatitis without ascites: Secondary | ICD-10-CM

## 2019-11-21 DIAGNOSIS — R1312 Dysphagia, oropharyngeal phase: Secondary | ICD-10-CM | POA: Diagnosis present

## 2019-11-21 DIAGNOSIS — J810 Acute pulmonary edema: Secondary | ICD-10-CM | POA: Diagnosis not present

## 2019-11-21 DIAGNOSIS — K7040 Alcoholic hepatic failure without coma: Secondary | ICD-10-CM | POA: Diagnosis present

## 2019-11-21 DIAGNOSIS — R0902 Hypoxemia: Secondary | ICD-10-CM

## 2019-11-21 DIAGNOSIS — Z7189 Other specified counseling: Secondary | ICD-10-CM | POA: Diagnosis not present

## 2019-11-21 DIAGNOSIS — Z91128 Patient's intentional underdosing of medication regimen for other reason: Secondary | ICD-10-CM

## 2019-11-21 DIAGNOSIS — J9601 Acute respiratory failure with hypoxia: Secondary | ICD-10-CM | POA: Diagnosis not present

## 2019-11-21 DIAGNOSIS — Z515 Encounter for palliative care: Secondary | ICD-10-CM | POA: Diagnosis not present

## 2019-11-21 DIAGNOSIS — T421X6A Underdosing of iminostilbenes, initial encounter: Secondary | ICD-10-CM | POA: Diagnosis present

## 2019-11-21 DIAGNOSIS — F4310 Post-traumatic stress disorder, unspecified: Secondary | ICD-10-CM | POA: Diagnosis present

## 2019-11-21 DIAGNOSIS — D6959 Other secondary thrombocytopenia: Secondary | ICD-10-CM | POA: Diagnosis present

## 2019-11-21 DIAGNOSIS — R1310 Dysphagia, unspecified: Secondary | ICD-10-CM

## 2019-11-21 DIAGNOSIS — E872 Acidosis, unspecified: Secondary | ICD-10-CM | POA: Diagnosis present

## 2019-11-21 DIAGNOSIS — F10231 Alcohol dependence with withdrawal delirium: Secondary | ICD-10-CM | POA: Diagnosis not present

## 2019-11-21 DIAGNOSIS — R296 Repeated falls: Secondary | ICD-10-CM | POA: Diagnosis present

## 2019-11-21 DIAGNOSIS — F17210 Nicotine dependence, cigarettes, uncomplicated: Secondary | ICD-10-CM | POA: Diagnosis present

## 2019-11-21 DIAGNOSIS — D638 Anemia in other chronic diseases classified elsewhere: Secondary | ICD-10-CM | POA: Diagnosis present

## 2019-11-21 DIAGNOSIS — Z66 Do not resuscitate: Secondary | ICD-10-CM | POA: Diagnosis not present

## 2019-11-21 DIAGNOSIS — Z20822 Contact with and (suspected) exposure to covid-19: Secondary | ICD-10-CM | POA: Diagnosis present

## 2019-11-21 DIAGNOSIS — Z8249 Family history of ischemic heart disease and other diseases of the circulatory system: Secondary | ICD-10-CM

## 2019-11-21 DIAGNOSIS — F10229 Alcohol dependence with intoxication, unspecified: Secondary | ICD-10-CM | POA: Diagnosis present

## 2019-11-21 DIAGNOSIS — N179 Acute kidney failure, unspecified: Secondary | ICD-10-CM | POA: Diagnosis not present

## 2019-11-21 DIAGNOSIS — D649 Anemia, unspecified: Secondary | ICD-10-CM | POA: Diagnosis present

## 2019-11-21 DIAGNOSIS — Z9049 Acquired absence of other specified parts of digestive tract: Secondary | ICD-10-CM

## 2019-11-21 DIAGNOSIS — K7460 Unspecified cirrhosis of liver: Secondary | ICD-10-CM | POA: Diagnosis present

## 2019-11-21 DIAGNOSIS — Z6835 Body mass index (BMI) 35.0-35.9, adult: Secondary | ICD-10-CM

## 2019-11-21 DIAGNOSIS — F319 Bipolar disorder, unspecified: Secondary | ICD-10-CM | POA: Diagnosis present

## 2019-11-21 DIAGNOSIS — R748 Abnormal levels of other serum enzymes: Secondary | ICD-10-CM | POA: Diagnosis present

## 2019-11-21 DIAGNOSIS — Z79899 Other long term (current) drug therapy: Secondary | ICD-10-CM

## 2019-11-21 DIAGNOSIS — K7200 Acute and subacute hepatic failure without coma: Secondary | ICD-10-CM | POA: Diagnosis present

## 2019-11-21 LAB — OSMOLALITY: Osmolality: 245 mOsm/kg — CL (ref 275–295)

## 2019-11-21 LAB — CBC
HCT: 26.3 % — ABNORMAL LOW (ref 36.0–46.0)
Hemoglobin: 9.6 g/dL — ABNORMAL LOW (ref 12.0–15.0)
MCH: 37.1 pg — ABNORMAL HIGH (ref 26.0–34.0)
MCHC: 36.5 g/dL — ABNORMAL HIGH (ref 30.0–36.0)
MCV: 101.5 fL — ABNORMAL HIGH (ref 80.0–100.0)
Platelets: 158 10*3/uL (ref 150–400)
RBC: 2.59 MIL/uL — ABNORMAL LOW (ref 3.87–5.11)
RDW: 15.9 % — ABNORMAL HIGH (ref 11.5–15.5)
WBC: 11.9 10*3/uL — ABNORMAL HIGH (ref 4.0–10.5)
nRBC: 0.2 % (ref 0.0–0.2)

## 2019-11-21 LAB — COMPREHENSIVE METABOLIC PANEL
ALT: 66 U/L — ABNORMAL HIGH (ref 0–44)
AST: 313 U/L — ABNORMAL HIGH (ref 15–41)
Albumin: 2.7 g/dL — ABNORMAL LOW (ref 3.5–5.0)
Alkaline Phosphatase: 476 U/L — ABNORMAL HIGH (ref 38–126)
BUN: <5 mg/dL — ABNORMAL LOW (ref 6–20)
CO2: 24 mmol/L (ref 22–32)
Calcium: 7.8 mg/dL — ABNORMAL LOW (ref 8.9–10.3)
Chloride: <65 mmol/L — CL (ref 98–111)
Creatinine, Ser: <0.3 mg/dL — ABNORMAL LOW (ref 0.44–1.00)
Glucose, Bld: 147 mg/dL — ABNORMAL HIGH (ref 70–99)
Potassium: <2 mmol/L — CL (ref 3.5–5.1)
Sodium: 108 mmol/L — CL (ref 135–145)
Total Bilirubin: 20.1 mg/dL (ref 0.3–1.2)
Total Protein: 6.3 g/dL — ABNORMAL LOW (ref 6.5–8.1)

## 2019-11-21 LAB — URINE DRUG SCREEN, QUALITATIVE (ARMC ONLY)
Amphetamines, Ur Screen: NOT DETECTED
Barbiturates, Ur Screen: NOT DETECTED
Benzodiazepine, Ur Scrn: NOT DETECTED
Cannabinoid 50 Ng, Ur ~~LOC~~: NOT DETECTED
Cocaine Metabolite,Ur ~~LOC~~: NOT DETECTED
MDMA (Ecstasy)Ur Screen: NOT DETECTED
Methadone Scn, Ur: NOT DETECTED
Opiate, Ur Screen: NOT DETECTED
Phencyclidine (PCP) Ur S: NOT DETECTED
Tricyclic, Ur Screen: NOT DETECTED

## 2019-11-21 LAB — URINALYSIS, COMPLETE (UACMP) WITH MICROSCOPIC
Glucose, UA: NEGATIVE mg/dL
Ketones, ur: 20 mg/dL — AB
Leukocytes,Ua: NEGATIVE
Nitrite: POSITIVE — AB
Protein, ur: 30 mg/dL — AB
Specific Gravity, Urine: 1.025 (ref 1.005–1.030)
pH: 5 (ref 5.0–8.0)

## 2019-11-21 LAB — MAGNESIUM
Magnesium: 1.7 mg/dL (ref 1.7–2.4)
Magnesium: 1.5 mg/dL — ABNORMAL LOW (ref 1.7–2.4)

## 2019-11-21 LAB — AMMONIA: Ammonia: 106 umol/L — ABNORMAL HIGH (ref 9–35)

## 2019-11-21 LAB — BLOOD GAS, VENOUS
Acid-Base Excess: 6.1 mmol/L — ABNORMAL HIGH (ref 0.0–2.0)
Bicarbonate: 29.5 mmol/L — ABNORMAL HIGH (ref 20.0–28.0)
O2 Saturation: 78.6 %
Patient temperature: 37
pCO2, Ven: 37 mmHg — ABNORMAL LOW (ref 44.0–60.0)
pH, Ven: 7.51 — ABNORMAL HIGH (ref 7.250–7.430)
pO2, Ven: 38 mmHg (ref 32.0–45.0)

## 2019-11-21 LAB — SALICYLATE LEVEL: Salicylate Lvl: 11.8 mg/dL (ref 7.0–30.0)

## 2019-11-21 LAB — ACETAMINOPHEN LEVEL: Acetaminophen (Tylenol), Serum: <10 ug/mL — ABNORMAL LOW (ref 10–30)

## 2019-11-21 LAB — SODIUM
Sodium: 111 mmol/L — CL (ref 135–145)
Sodium: 116 mmol/L — CL (ref 135–145)

## 2019-11-21 LAB — BASIC METABOLIC PANEL
BUN: <5 mg/dL — ABNORMAL LOW (ref 6–20)
CO2: 23 mmol/L (ref 22–32)
Calcium: 7.8 mg/dL — ABNORMAL LOW (ref 8.9–10.3)
Chloride: <65 mmol/L — CL (ref 98–111)
Creatinine, Ser: <0.3 mg/dL — ABNORMAL LOW (ref 0.44–1.00)
Glucose, Bld: 127 mg/dL — ABNORMAL HIGH (ref 70–99)
Potassium: <2 mmol/L — CL (ref 3.5–5.1)
Sodium: 111 mmol/L — CL (ref 135–145)

## 2019-11-21 LAB — POTASSIUM: Potassium: 2 mmol/L — CL (ref 3.5–5.1)

## 2019-11-21 LAB — ETHANOL: Alcohol, Ethyl (B): 119 mg/dL — ABNORMAL HIGH (ref <10)

## 2019-11-21 LAB — GLUCOSE, CAPILLARY: Glucose-Capillary: 147 mg/dL — ABNORMAL HIGH (ref 70–99)

## 2019-11-21 LAB — PROTIME-INR
INR: 1.3 — ABNORMAL HIGH (ref 0.8–1.2)
Prothrombin Time: 16.1 seconds — ABNORMAL HIGH (ref 11.4–15.2)

## 2019-11-21 LAB — OSMOLALITY, URINE: Osmolality, Ur: 499 mOsm/kg (ref 300–900)

## 2019-11-21 LAB — RESPIRATORY PANEL BY RT PCR (FLU A&B, COVID)
Influenza A by PCR: NEGATIVE
Influenza B by PCR: NEGATIVE
SARS Coronavirus 2 by RT PCR: NEGATIVE

## 2019-11-21 LAB — LACTIC ACID, PLASMA
Lactic Acid, Venous: >11 mmol/L (ref 0.5–1.9)
Lactic Acid, Venous: >11 mmol/L (ref 0.5–1.9)
Lactic Acid, Venous: 10.9 mmol/L (ref 0.5–1.9)

## 2019-11-21 LAB — PROCALCITONIN: Procalcitonin: 1.2 ng/mL

## 2019-11-21 IMAGING — CT CT HEAD W/O CM
3 series · 16 of 47 positions shown, 19 images · non-contrast
Comparison: [DATE]

CLINICAL DATA: Seizure.

EXAM:
CT HEAD WITHOUT CONTRAST
TECHNIQUE: Contiguous axial images were obtained from the base of the skull
through the vertex without intravenous contrast.

[Series 3: head wo · axial · 0.42mm/px · z∈[+653,+778]mm · 10 of 30 slices shown, 13 images]
[im 3/30  brain]
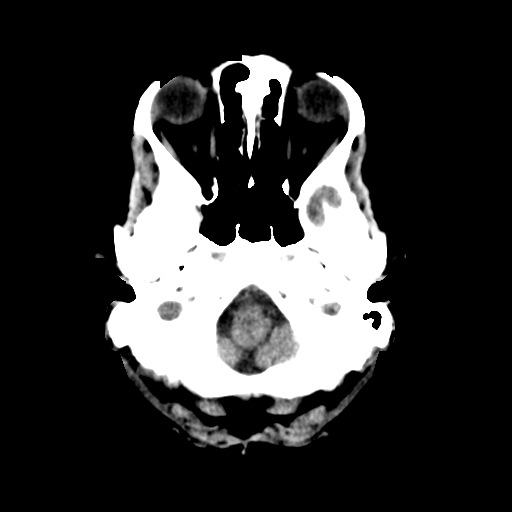
[im 3/30  bone]
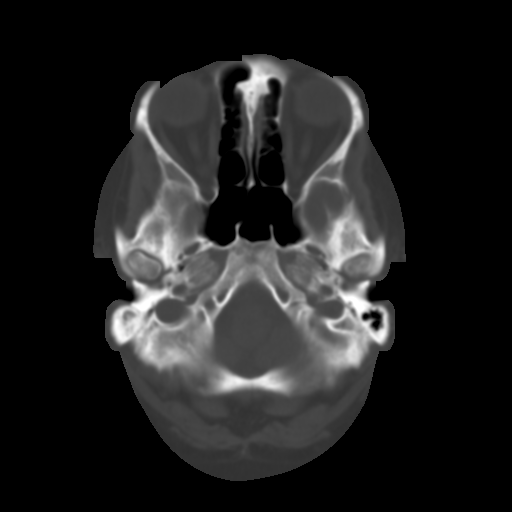
[im 6/30  brain]
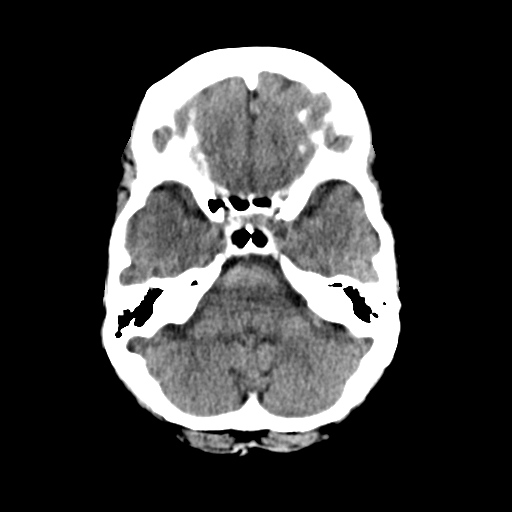
[im 9/30  brain]
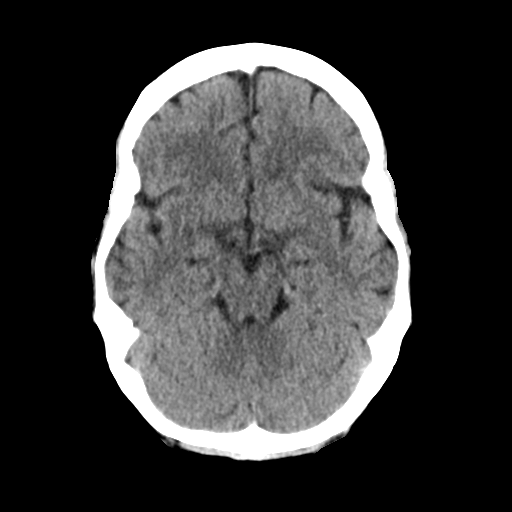
[im 11/30  brain]
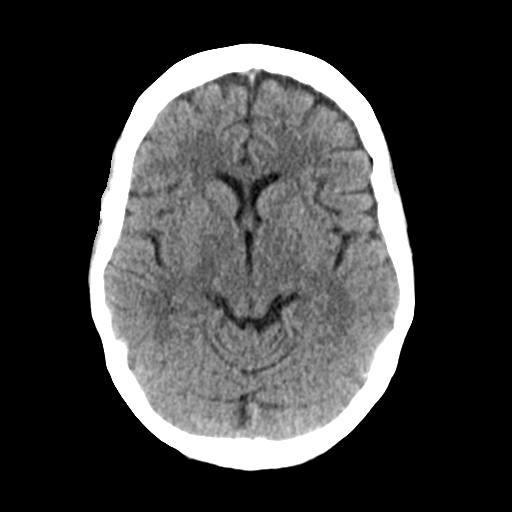
[im 14/30  brain]
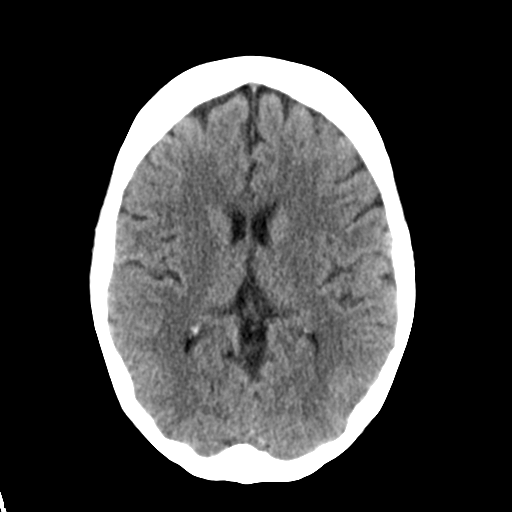
[im 14/30  bone]
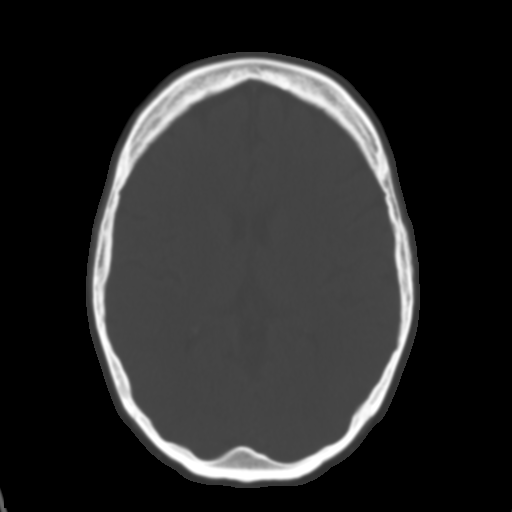
[im 17/30  brain]
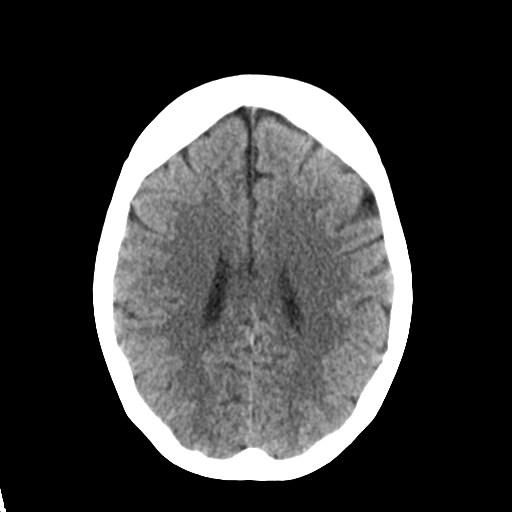
[im 20/30  brain]
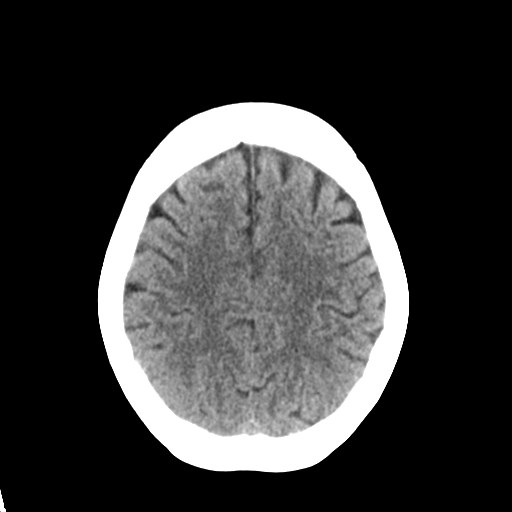
[im 23/30  brain]
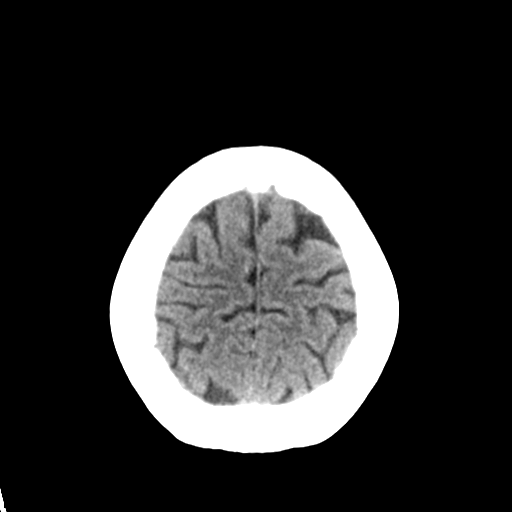
[im 25/30  brain]
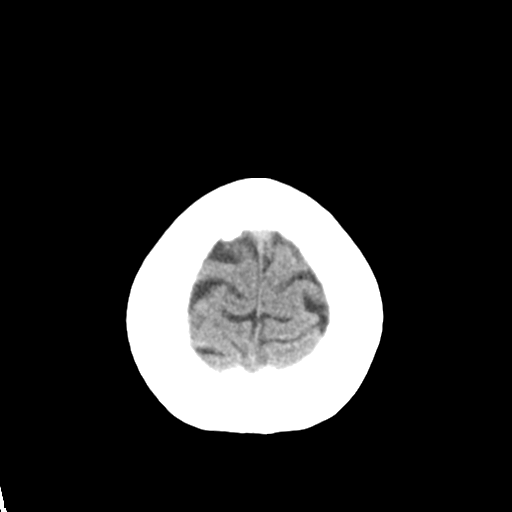
[im 25/30  bone]
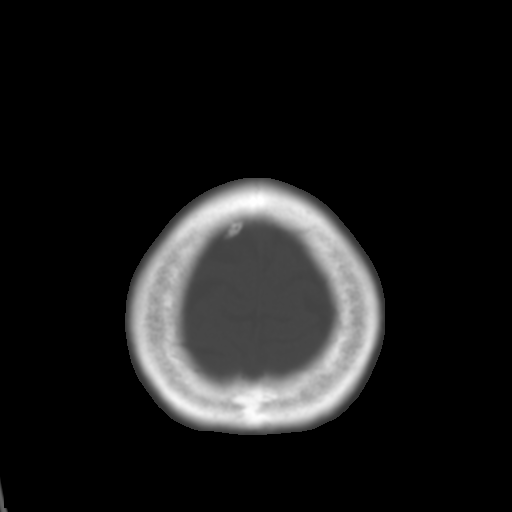
[im 28/30  brain]
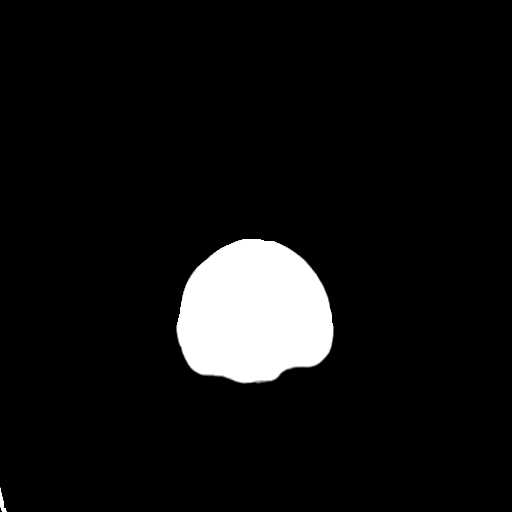

[Series 4: coronal soft tissue · coronal · 0.32mm/px · 3 of 64 slices shown]
[im 22/64  brain]
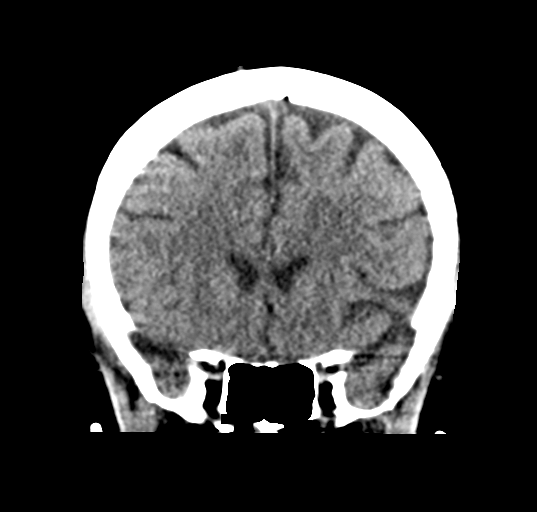
[im 29/64  brain]
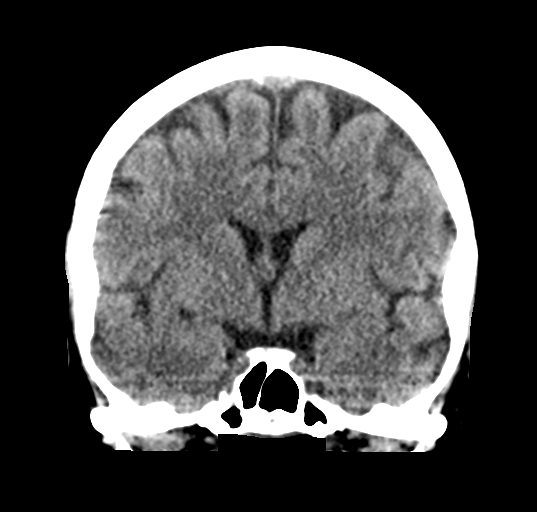
[im 36/64  brain]
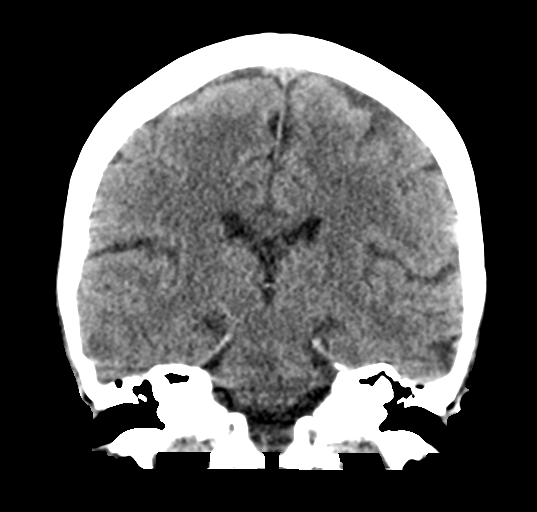

[Series 5: sagittal soft tissue · sagittal · 0.32mm/px · 3 of 51 slices shown]
[im 17/51  brain]
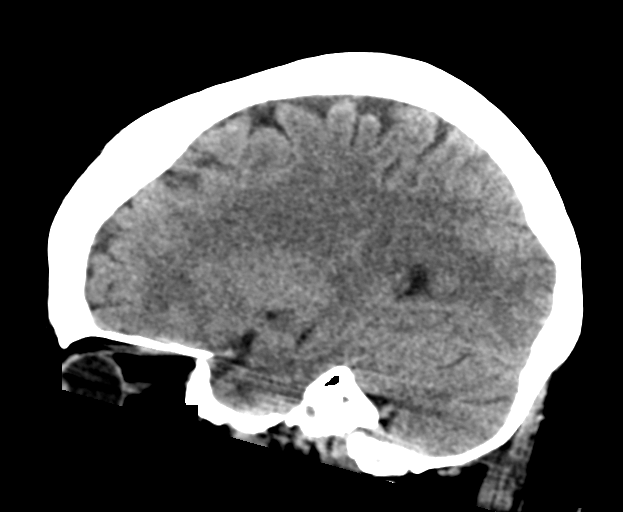
[im 26/51  brain]
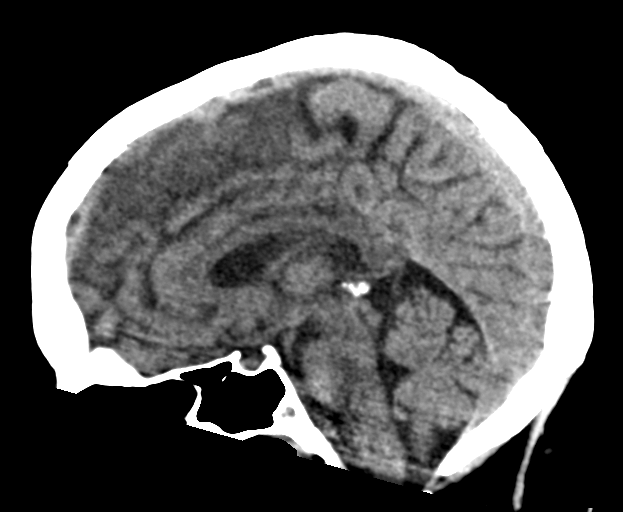
[im 34/51  brain]
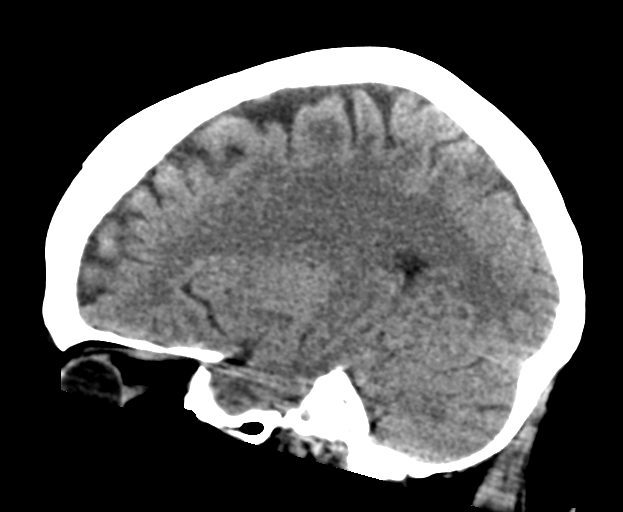

[16 of 47 positions shown; findings below may reference images not displayed]

FINDINGS: Brain: No evidence of acute infarction, hemorrhage, hydrocephalus,
extra-axial collection or mass lesion/mass effect.

Vascular: No hyperdense vessel or unexpected calcification.

Skull: Normal. Negative for fracture or focal lesion.

Sinuses/Orbits: No acute finding.

Other: None.
IMPRESSION: No acute intracranial pathology.

## 2019-11-21 IMAGING — US US ABDOMEN LIMITED
1 series · 14 of 25 positions shown · non-contrast
Comparison: [DATE] CT chest, abdomen and pelvis. [DATE]
abdominal ultrasound and prior.

CLINICAL DATA: Bilirubinemia, alcoholism.

EXAM:
ULTRASOUND ABDOMEN LIMITED RIGHT UPPER QUADRANT

[Series 1: us abdomen limited ruq (liver/gb) · 14 of 31 slices shown]
[im 1/31]
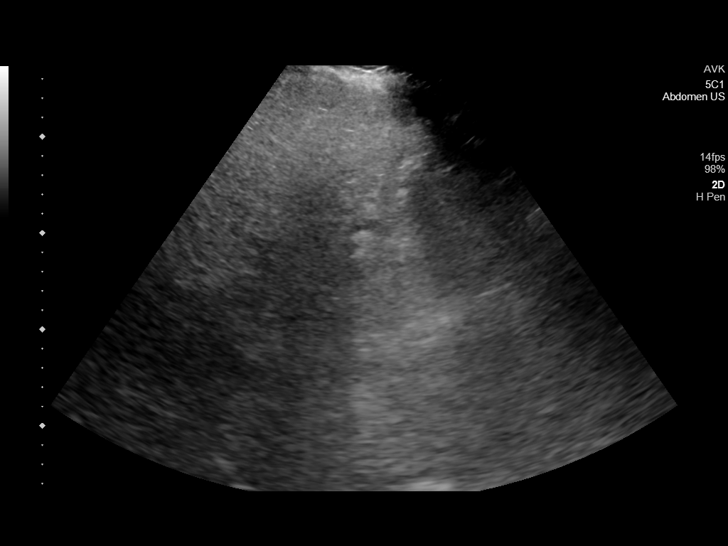
[im 3/31]
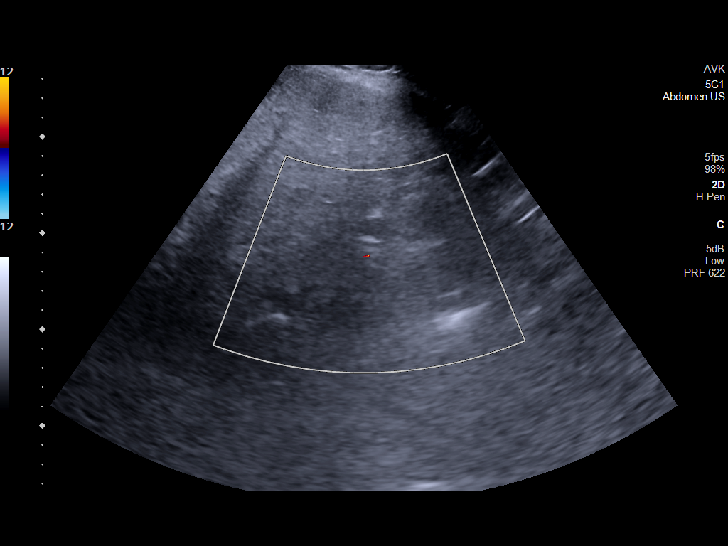
[im 6/31]
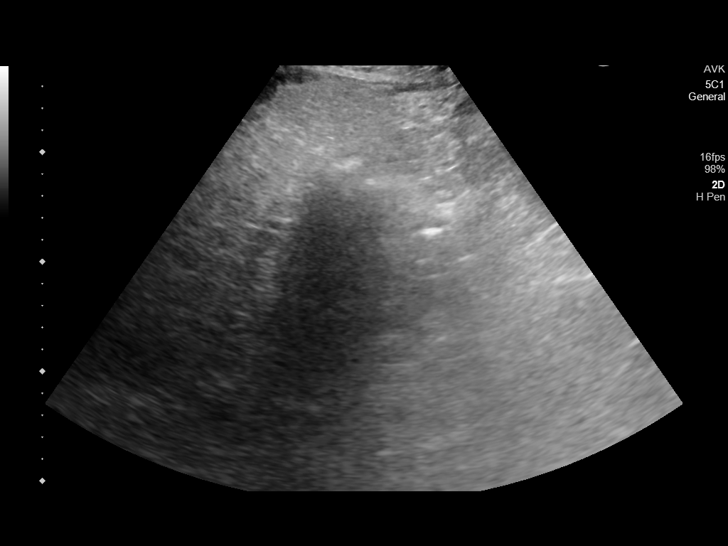
[im 8/31]
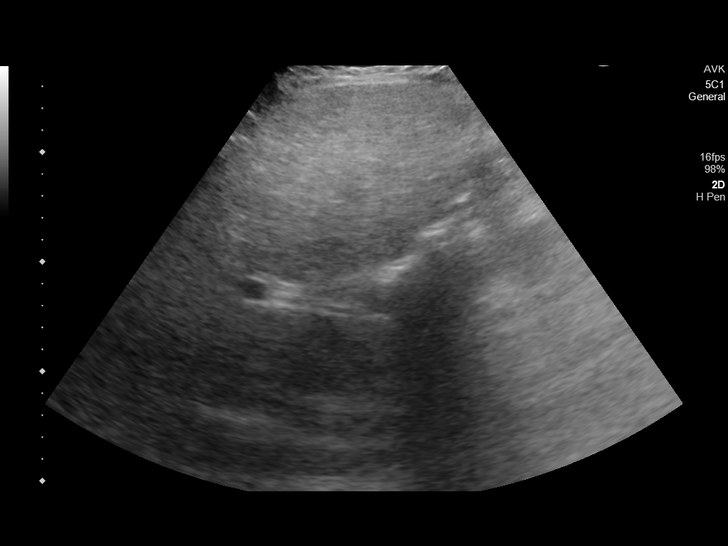
[im 11/31]
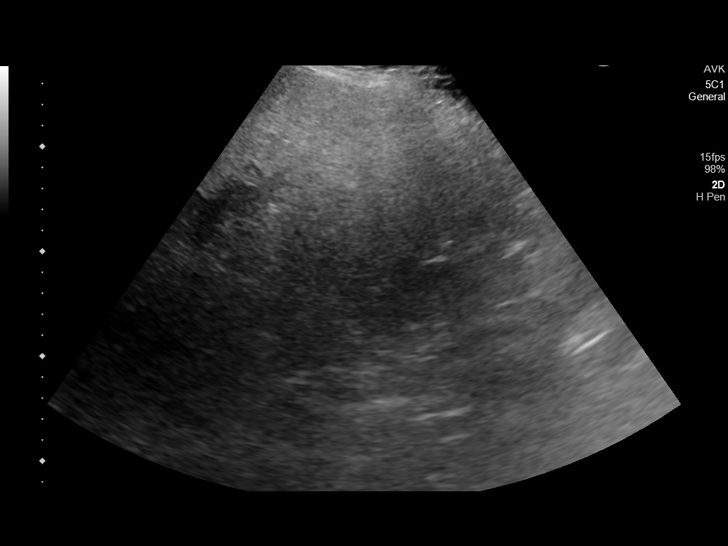
[im 12/31]
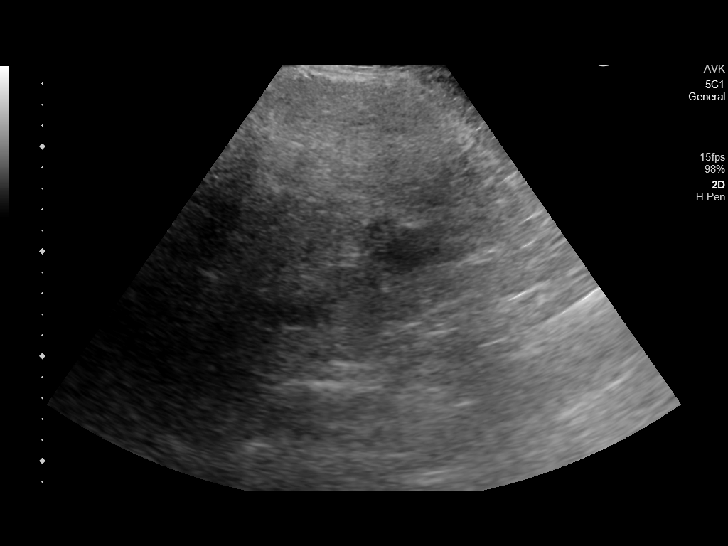
[im 14/31]
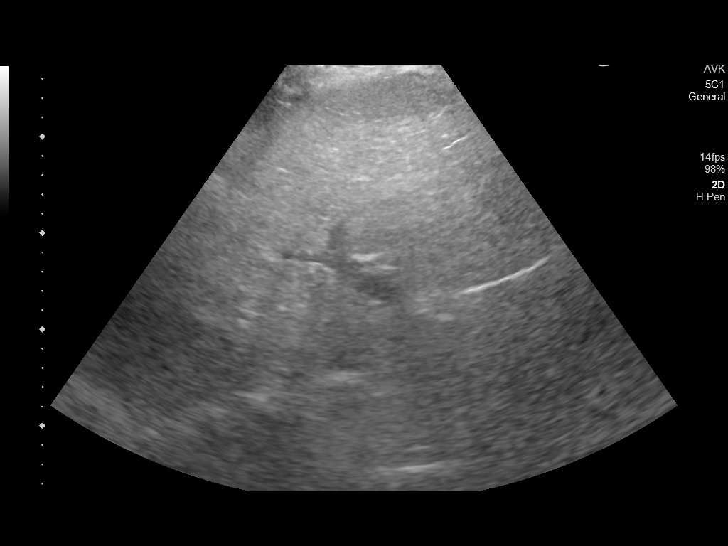
[im 17/31]
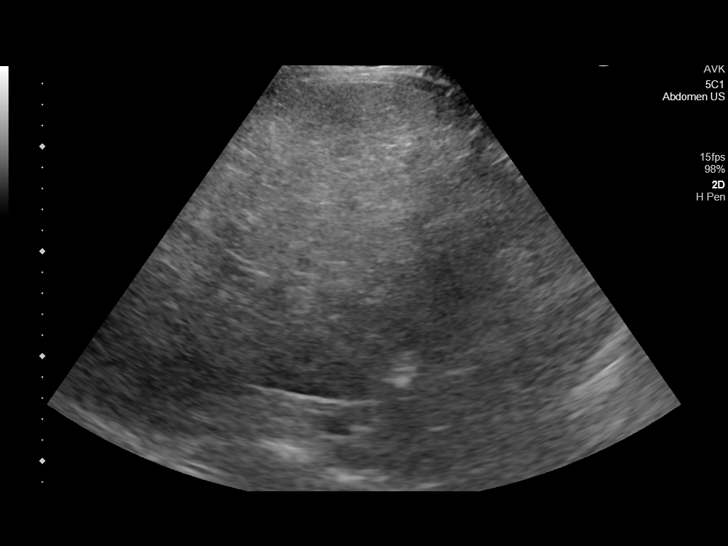
[im 19/31]
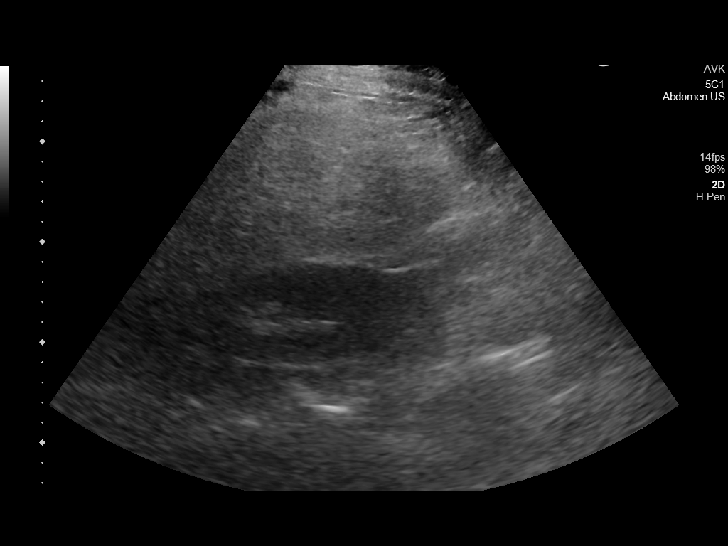
[im 21/31]
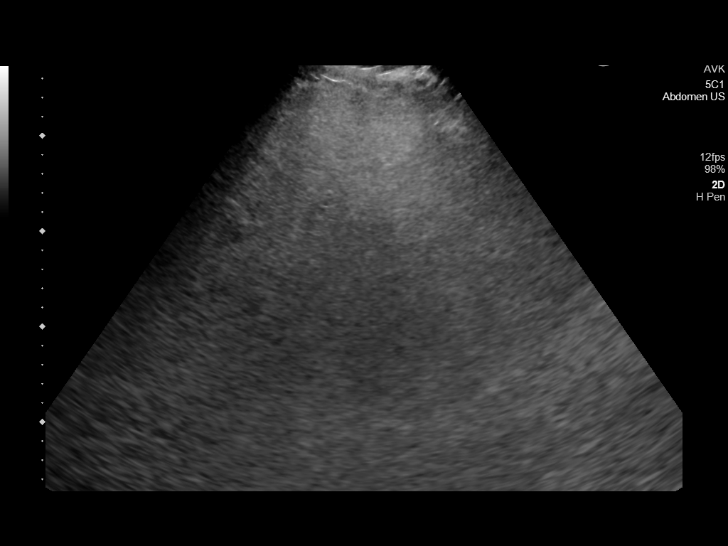
[im 23/31]
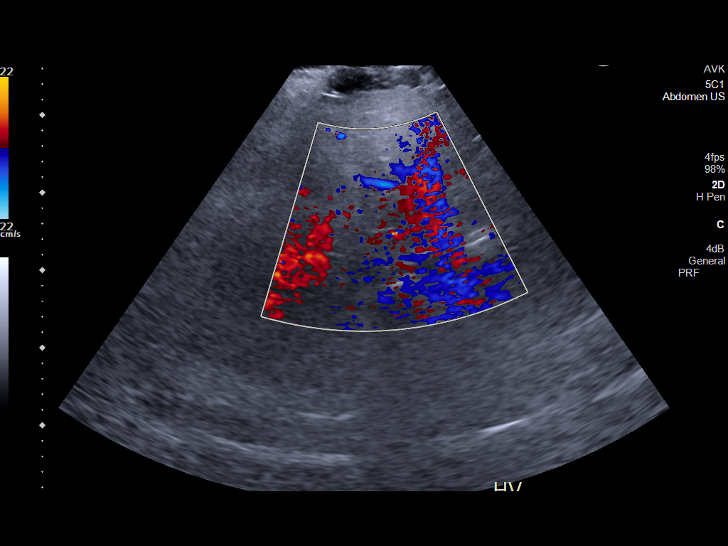
[im 26/31]
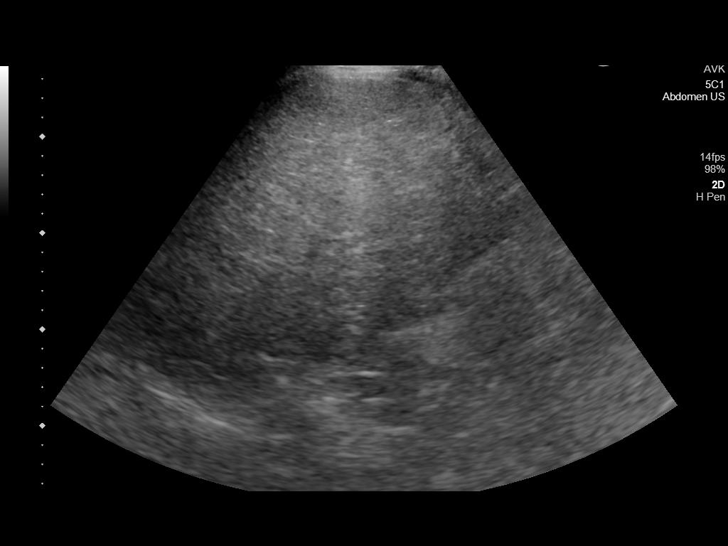
[im 28/31]
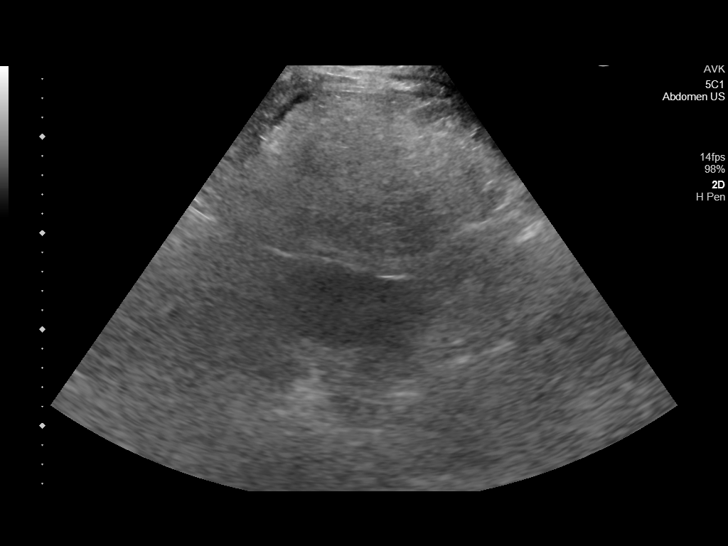
[im 31/31]
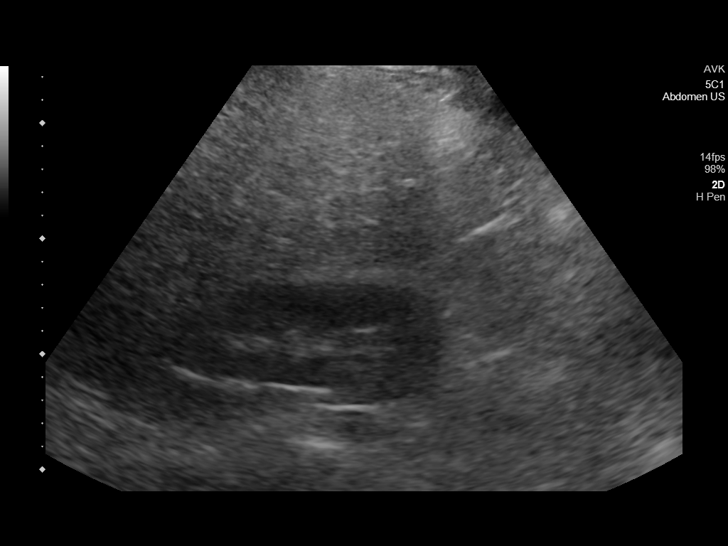

[14 of 25 positions shown; findings below may reference images not displayed]

FINDINGS: Gallbladder:

Surgically absent.

Common bile duct:

Diameter: 13 mm, compatible with post cholecystectomy sequela.

Liver:

No focal lesion identified. Heterogenous, echogenic parenchyma.
Please note that ultrasound is limited in its sensitivity to
evaluate a heterogenous liver for focal lesions. Portal vein is
patent on color Doppler imaging with normal direction of blood flow
towards the liver.

Other: None.
IMPRESSION: Heterogenous, echogenic hepatic parenchyma. No focal hepatic lesion.

Post cholecystectomy sequela.

## 2019-11-21 IMAGING — DX DG CHEST 1V PORT
1 series · 1 of 1 positions shown · non-contrast
Comparison: [DATE]

CLINICAL DATA: Weakness.  Liver failure, confusion.

EXAM:
PORTABLE CHEST 1 VIEW

[chest ap]
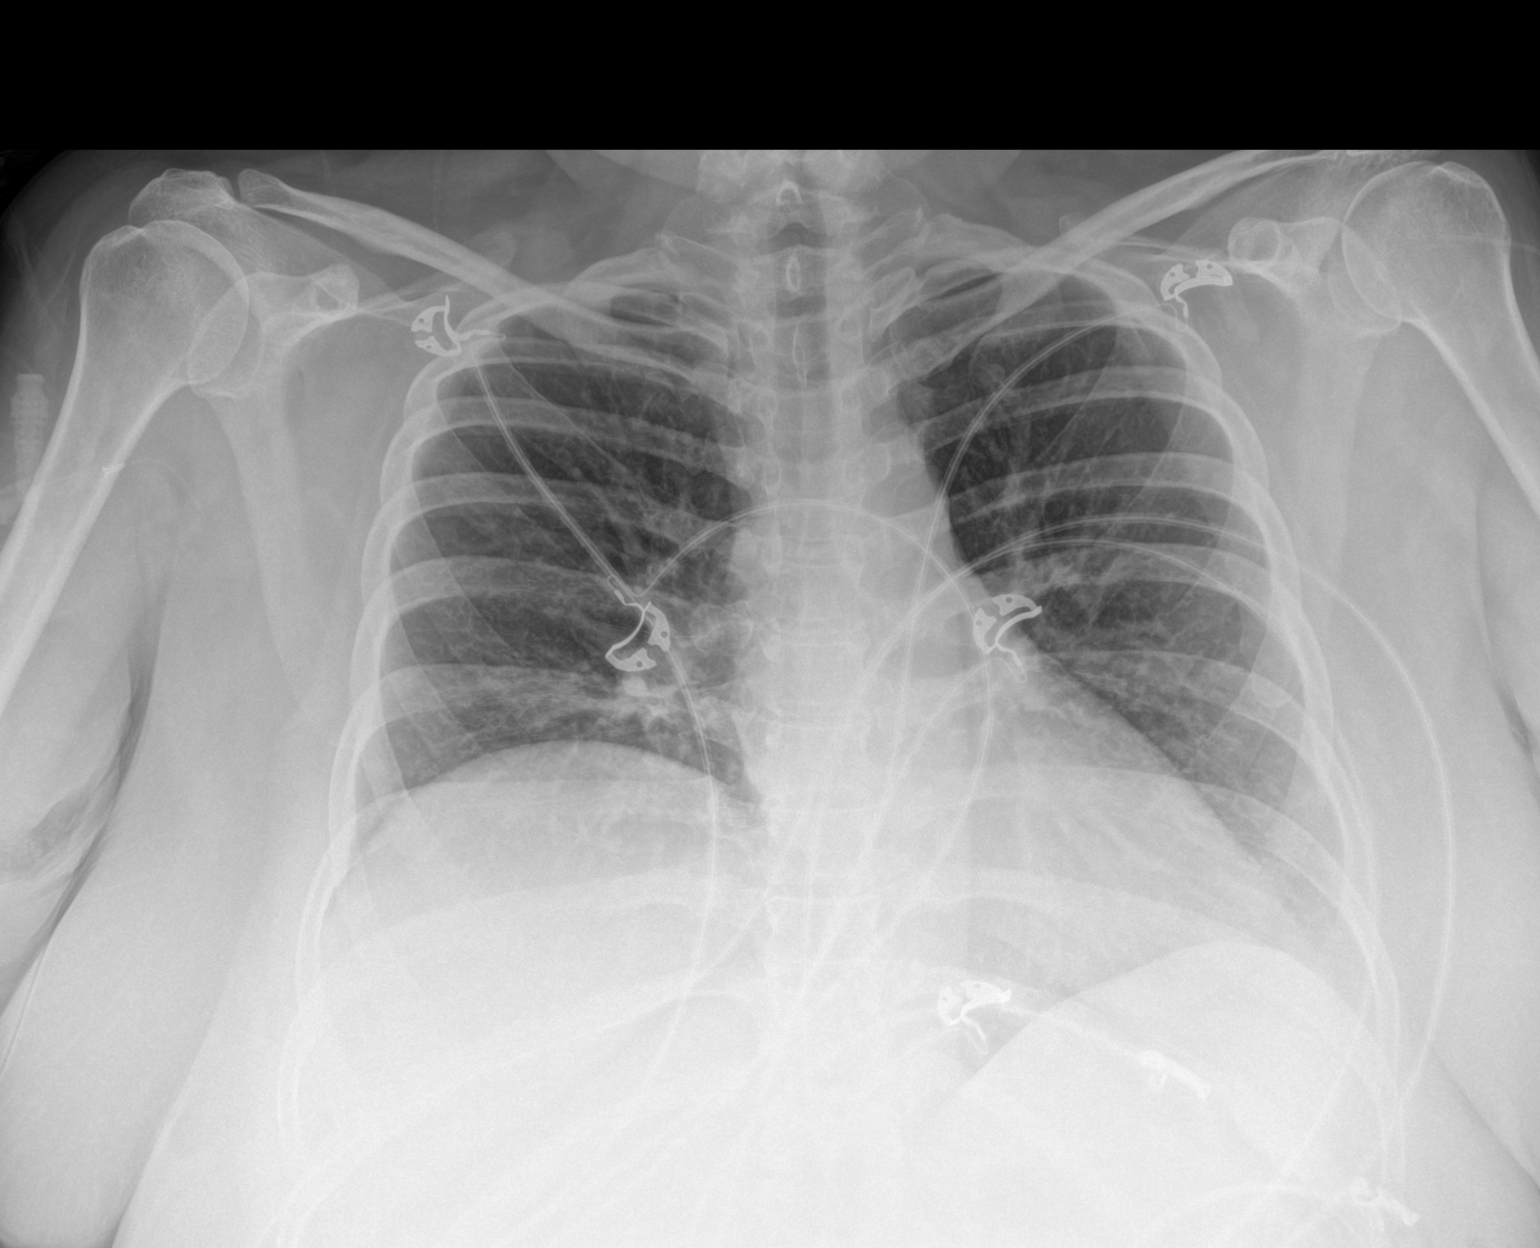

[1 of 1 positions shown; findings below may reference images not displayed]

FINDINGS: Elevated right hemidiaphragm with right lower lobe atelectasis. Left
lung is clear. Heart size and vascularity normal.
IMPRESSION: Right lower lobe atelectasis.  No acute abnormality.

## 2019-11-21 IMAGING — CT CT ABD-PELV W/O CM
2 of 5 series · 15 of 46 positions shown, 17 images · non-contrast
Comparison: [DATE]

CLINICAL DATA: Left upper quadrant pain.

EXAM:
CT ABDOMEN AND PELVIS WITHOUT CONTRAST
TECHNIQUE: Multidetector CT imaging of the abdomen and pelvis was performed
following the standard protocol without IV contrast.

[Series 2: routine abd/pel wo · axial · 0.74mm/px · z∈[-81,+399]mm · 12 of 114 slices shown, 14 images]
[im 9/114  soft-tissue]
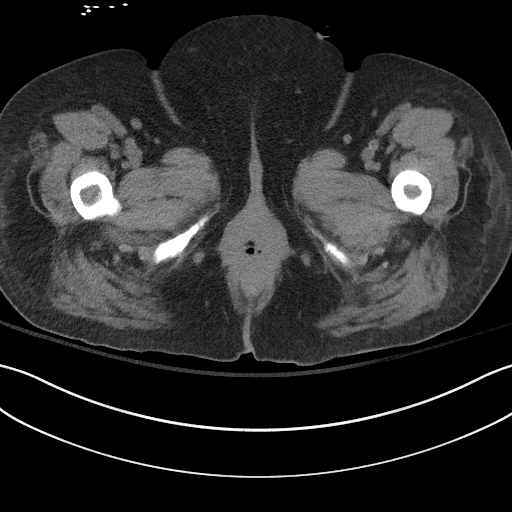
[im 9/114  bone]
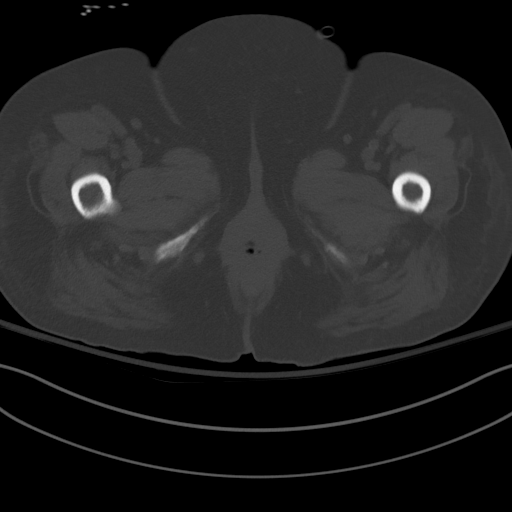
[im 18/114  soft-tissue]
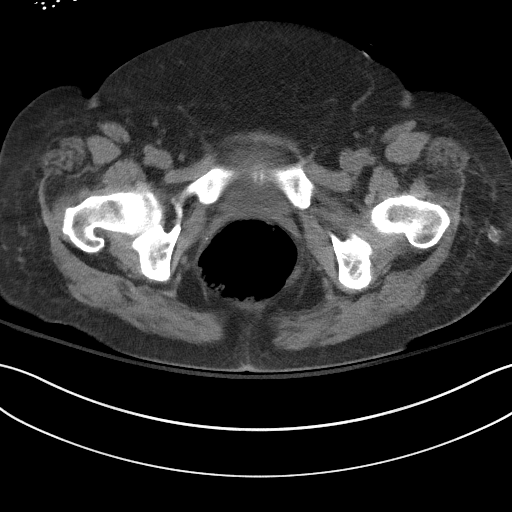
[im 27/114  soft-tissue]
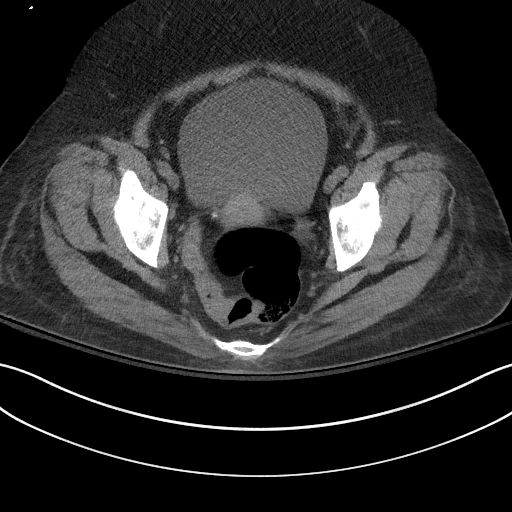
[im 35/114  soft-tissue]
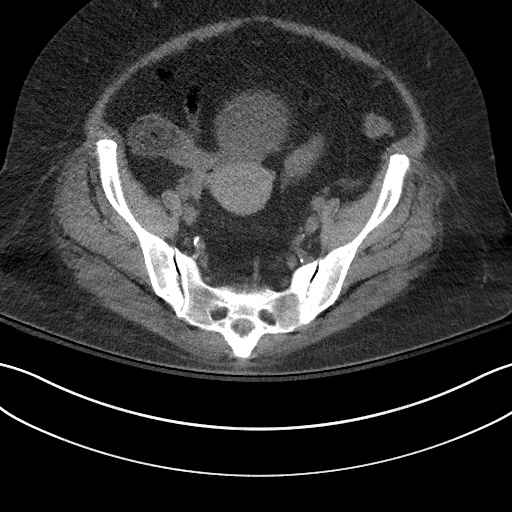
[im 44/114  soft-tissue]
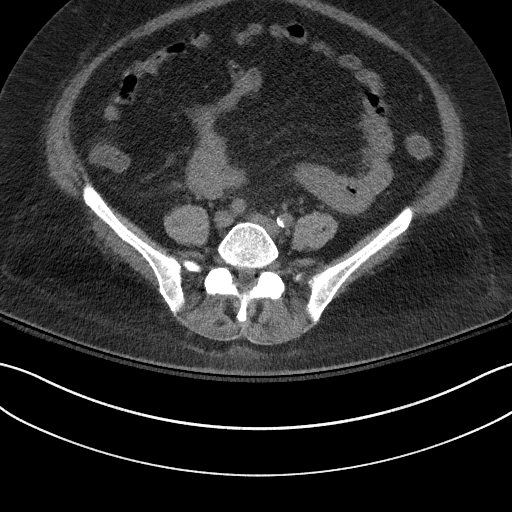
[im 53/114  soft-tissue]
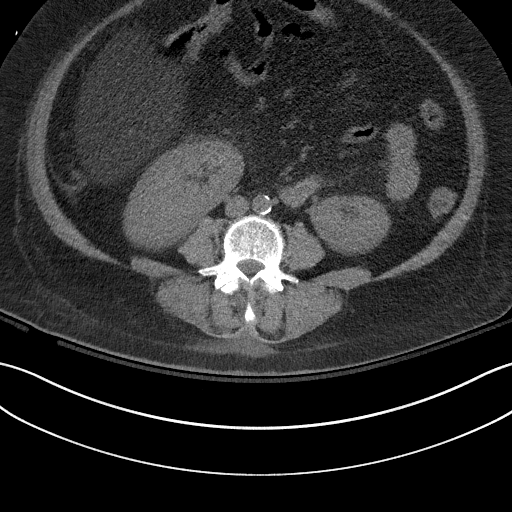
[im 61/114  soft-tissue]
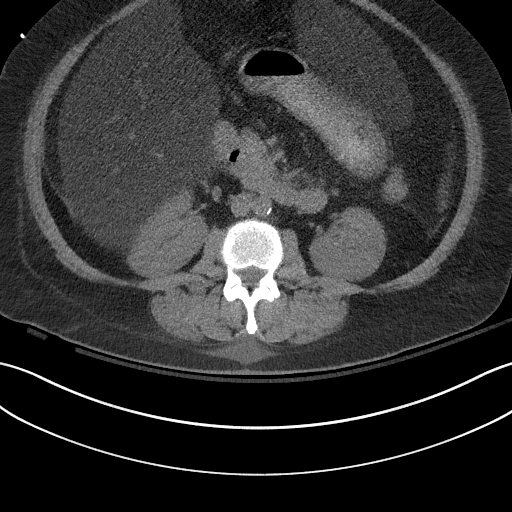
[im 70/114  soft-tissue]
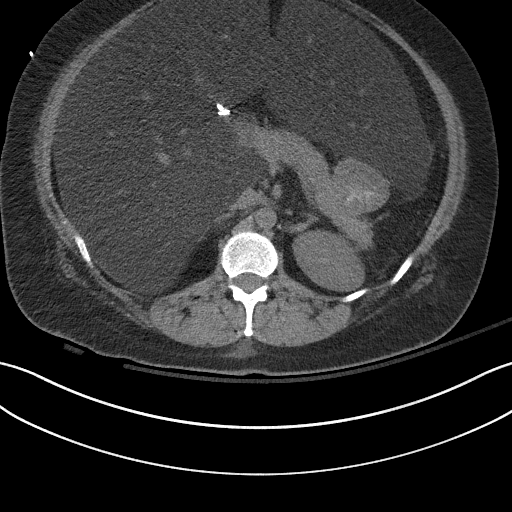
[im 79/114  soft-tissue]
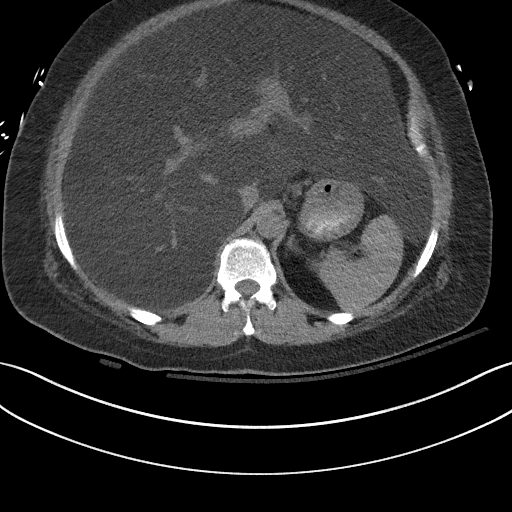
[im 79/114  bone]
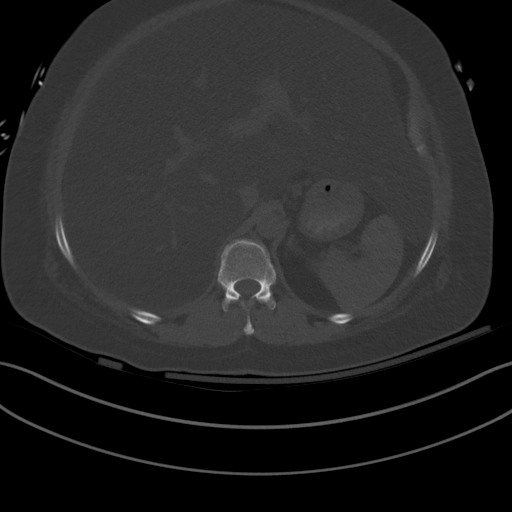
[im 87/114  soft-tissue]
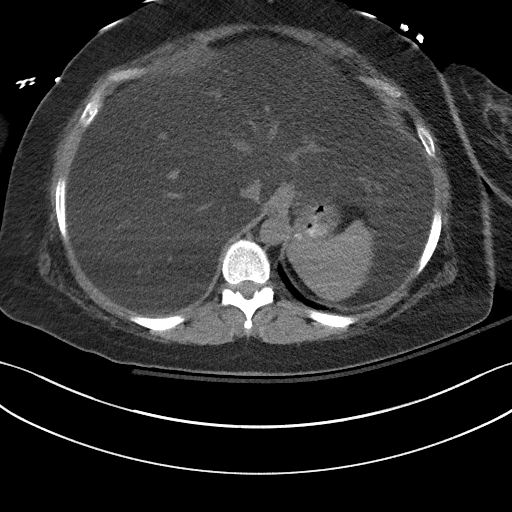
[im 96/114  soft-tissue]
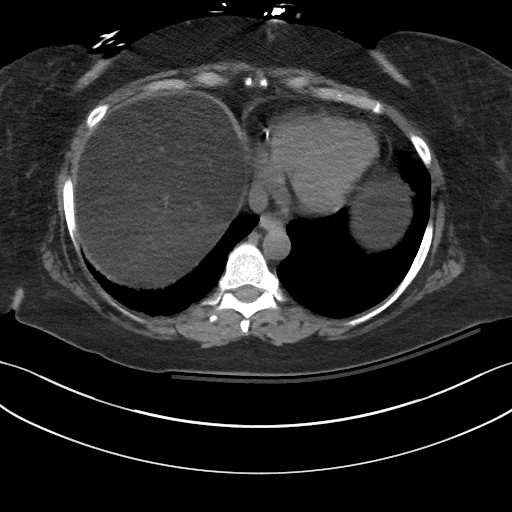
[im 105/114  soft-tissue]
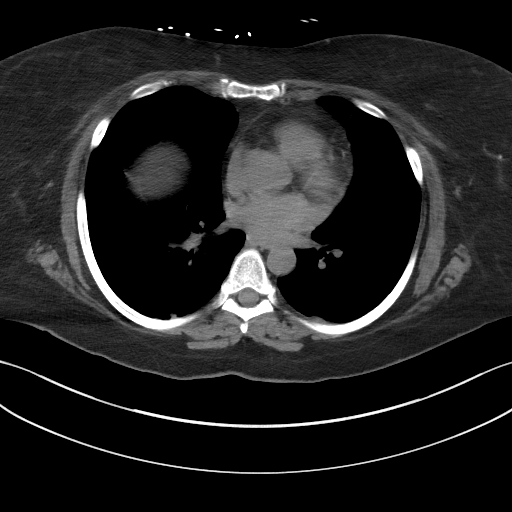

[Series 6: coronal st · coronal · 0.88mm/px · 3 of 114 slices shown]
[im 38/114  soft-tissue]
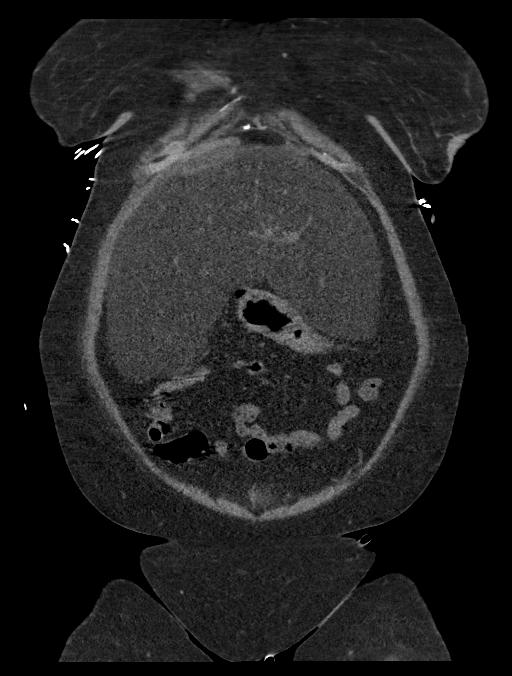
[im 51/114  soft-tissue]
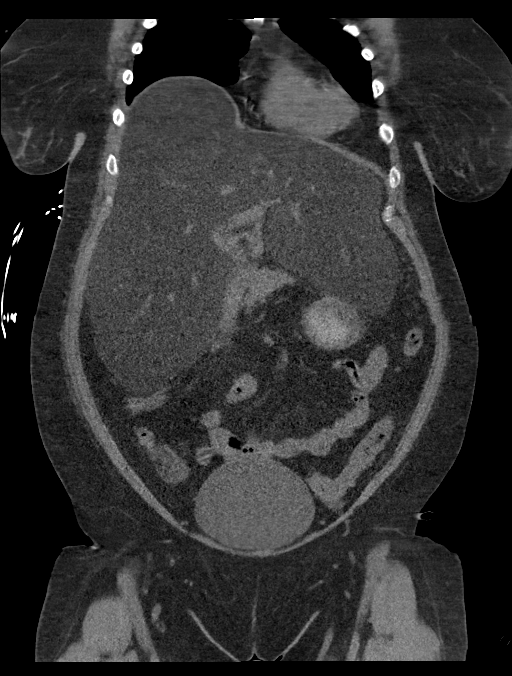
[im 63/114  soft-tissue]
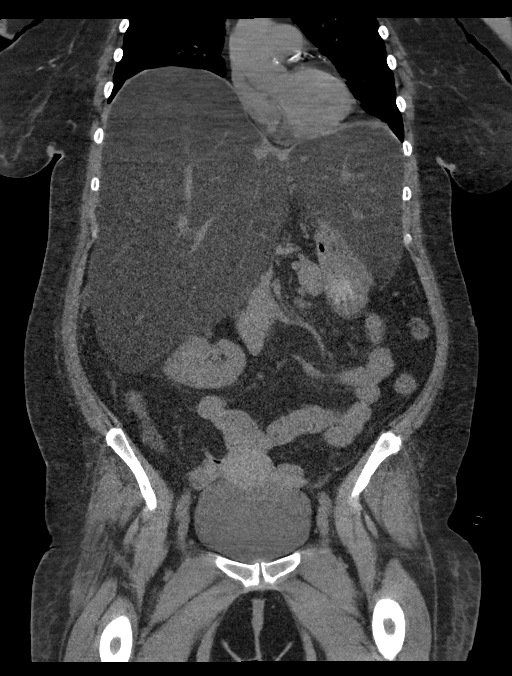

[15 of 46 positions shown; findings below may reference images not displayed]

FINDINGS: Lower chest: No acute abnormality.

Hepatobiliary: The liver is enlarged with diffuse fatty infiltration
of the liver parenchyma. No focal liver abnormality is seen. Status
post cholecystectomy. No biliary dilatation.

Pancreas: Unremarkable. No pancreatic ductal dilatation or
surrounding inflammatory changes.

Spleen: Normal in size without focal abnormality.

Adrenals/Urinary Tract: Adrenal glands are unremarkable. Kidneys are
normal, without renal calculi, focal lesion, or hydronephrosis.
Bladder is unremarkable.

Stomach/Bowel: Stomach is within normal limits. Appendix appears
normal. No evidence of bowel wall thickening, distention, or
inflammatory changes. Noninflamed diverticula are seen within the
descending and sigmoid colon.

Vascular/Lymphatic: There is moderate severity calcification of the
abdominal aorta and bilateral common iliac arteries, without
evidence of aneurysmal dilatation. No enlarged abdominal or pelvic
lymph nodes.

Reproductive: Uterus and bilateral adnexa are unremarkable.

Other: No abdominal wall hernia or abnormality. No abdominopelvic
ascites.

Musculoskeletal: No acute or significant osseous findings.
IMPRESSION: 1. Enlarged fatty liver.
2. Colonic diverticulosis.
3. Aortic atherosclerosis.

Aortic Atherosclerosis ([V0]-[V0]).

## 2019-11-21 MED ORDER — SODIUM CHLORIDE 0.9 % IV SOLN
2.0000 g | Freq: Once | INTRAVENOUS | Status: AC
  Administered 2019-11-21: 2 g via INTRAVENOUS
  Filled 2019-11-21: qty 20

## 2019-11-21 MED ORDER — MAGNESIUM SULFATE 2 GM/50ML IV SOLN
2.0000 g | INTRAVENOUS | Status: AC
  Administered 2019-11-21: 2 g via INTRAVENOUS
  Filled 2019-11-21: qty 50

## 2019-11-21 MED ORDER — SODIUM CHLORIDE 3 % IV SOLN
INTRAVENOUS | Status: DC
  Filled 2019-11-21 (×4): qty 500

## 2019-11-21 MED ORDER — THIAMINE HCL 100 MG/ML IJ SOLN
100.0000 mg | Freq: Every day | INTRAMUSCULAR | Status: DC
  Administered 2019-11-21 – 2019-11-26 (×3): 100 mg via INTRAVENOUS
  Filled 2019-11-21 (×5): qty 2

## 2019-11-21 MED ORDER — LORAZEPAM 2 MG/ML IJ SOLN: 0.0000 mg | Freq: Two times a day (BID) | INTRAMUSCULAR | Status: AC

## 2019-11-21 MED ORDER — FOLIC ACID 1 MG PO TABS
1.0000 mg | ORAL_TABLET | Freq: Every day | ORAL | Status: DC
  Administered 2019-11-21 – 2019-11-30 (×9): 1 mg via ORAL
  Filled 2019-11-21 (×10): qty 1

## 2019-11-21 MED ORDER — POLYETHYLENE GLYCOL 3350 17 G PO PACK: 17.0000 g | PACK | Freq: Every day | ORAL | Status: DC | PRN

## 2019-11-21 MED ORDER — OXCARBAZEPINE 300 MG PO TABS
600.0000 mg | ORAL_TABLET | Freq: Two times a day (BID) | ORAL | Status: DC
  Administered 2019-11-21 – 2019-11-30 (×18): 600 mg via ORAL
  Filled 2019-11-21 (×21): qty 2

## 2019-11-21 MED ORDER — MAGNESIUM SULFATE 2 GM/50ML IV SOLN
2.0000 g | Freq: Once | INTRAVENOUS | Status: AC
  Administered 2019-11-21: 2 g via INTRAVENOUS
  Filled 2019-11-21: qty 50

## 2019-11-21 MED ORDER — RIFAXIMIN 550 MG PO TABS
550.0000 mg | ORAL_TABLET | Freq: Two times a day (BID) | ORAL | Status: DC
  Administered 2019-11-21 – 2019-11-30 (×17): 550 mg via ORAL
  Filled 2019-11-21 (×20): qty 1

## 2019-11-21 MED ORDER — DOCUSATE SODIUM 100 MG PO CAPS: 100.0000 mg | ORAL_CAPSULE | Freq: Two times a day (BID) | ORAL | Status: DC | PRN

## 2019-11-21 MED ORDER — HEPARIN SODIUM (PORCINE) 5000 UNIT/ML IJ SOLN
5000.0000 [IU] | Freq: Three times a day (TID) | INTRAMUSCULAR | Status: DC
  Administered 2019-11-21 – 2019-12-05 (×40): 5000 [IU] via SUBCUTANEOUS
  Filled 2019-11-21 (×40): qty 1

## 2019-11-21 MED ORDER — PIPERACILLIN-TAZOBACTAM 3.375 G IVPB
3.3750 g | Freq: Three times a day (TID) | INTRAVENOUS | Status: DC
  Administered 2019-11-22 – 2019-11-25 (×11): 3.375 g via INTRAVENOUS
  Filled 2019-11-21 (×11): qty 50

## 2019-11-21 MED ORDER — LORAZEPAM 2 MG PO TABS
0.0000 mg | ORAL_TABLET | Freq: Two times a day (BID) | ORAL | Status: AC
  Administered 2019-11-23 – 2019-11-24 (×2): 2 mg via ORAL
  Filled 2019-11-21 (×2): qty 1

## 2019-11-21 MED ORDER — LACTATED RINGERS IV BOLUS
1000.0000 mL | Freq: Once | INTRAVENOUS | Status: AC
  Administered 2019-11-21: 1000 mL via INTRAVENOUS

## 2019-11-21 MED ORDER — LACTULOSE 10 GM/15ML PO SOLN
20.0000 g | Freq: Two times a day (BID) | ORAL | Status: DC
  Administered 2019-11-21 – 2019-11-25 (×9): 20 g via ORAL
  Filled 2019-11-21 (×9): qty 30

## 2019-11-21 MED ORDER — THIAMINE HCL 100 MG PO TABS
100.0000 mg | ORAL_TABLET | Freq: Every day | ORAL | Status: DC
  Administered 2019-11-23 – 2019-11-30 (×6): 100 mg via ORAL
  Filled 2019-11-21 (×8): qty 1

## 2019-11-21 MED ORDER — POTASSIUM CHLORIDE 10 MEQ/100ML IV SOLN
10.0000 meq | INTRAVENOUS | Status: AC
  Administered 2019-11-21 (×3): 10 meq via INTRAVENOUS
  Filled 2019-11-21 (×3): qty 100

## 2019-11-21 MED ORDER — POTASSIUM CHLORIDE 10 MEQ/100ML IV SOLN
10.0000 meq | INTRAVENOUS | Status: AC
  Administered 2019-11-21 – 2019-11-22 (×4): 10 meq via INTRAVENOUS
  Filled 2019-11-21 (×4): qty 100

## 2019-11-21 MED ORDER — LORAZEPAM 2 MG PO TABS
0.0000 mg | ORAL_TABLET | Freq: Four times a day (QID) | ORAL | Status: AC
  Administered 2019-11-21 – 2019-11-22 (×3): 1 mg via ORAL
  Administered 2019-11-22 (×2): 2 mg via ORAL
  Filled 2019-11-21 (×5): qty 1

## 2019-11-21 MED ORDER — ONDANSETRON HCL 4 MG/2ML IJ SOLN: 4.0000 mg | Freq: Four times a day (QID) | INTRAMUSCULAR | Status: DC | PRN

## 2019-11-21 MED ORDER — LORAZEPAM 2 MG/ML IJ SOLN
0.0000 mg | Freq: Four times a day (QID) | INTRAMUSCULAR | Status: AC
  Administered 2019-11-21 – 2019-11-23 (×2): 2 mg via INTRAVENOUS
  Filled 2019-11-21 (×2): qty 1

## 2019-11-21 NOTE — ED Notes (Signed)
Date and time results received: 11/28/19 1720 (use smartphrase ".now" to insert current time)  Test: Sodium Critical Value: 111  Name of Provider Notified: Scotty Court MD  Orders Received? Or Actions Taken?: See Orders

## 2019-11-21 NOTE — ED Notes (Signed)
Pt O2 saturation 88% on room air, placed on 2L nasal cannula and O2 saturation improved to 95%.

## 2019-11-21 NOTE — H&P (Signed)
NAME:  Ashlee Hawkins, MRN:  [id number], DOB:  [birth year], LOS: 0 ADMISSION DATE:  12/06/19, CONSULTATION DATE:  06-Dec-2019 REFERRING MD: Dr. Scotty Court  , CHIEF COMPLAINT:     Brief History   39 yo female with a significant history of alcohol hepatitis and seizures presenting to the ED with LUQ pain and reports of a seizure at home admitted to the ICU for monitoring.  History of present illness   39 yo female with a significant history of alcohol abuse, seizure disorder and alcoholic hepatitis presenting to the ED reporting LUQ abdominal pain and a seizure at home on the morning of 06-Dec-2019.  Patient reports last alcoholic beverage was the morning of 2019/12/06.  She reports drinking a 1/5th of liquor daily.  ED workup included labs significant for: Na+108, K+ <2.0, Cl <65, Total Bili 20.1, AST 313, ALT 66, Ammonia 106, WBC 11.9, Lactic >11.  Patient was started on NaCL 3% infusion, but Na+ corrected to 116 within 12 hours, and was discontinued.  The patient's husband reported that she stopped taking her anti-convulsant medication 2 weeks ago and began drinking.  He reported witnessing "small" seizure like activity during this time where the patient would stare off and slur her words. She experienced multiple falls during these past two weeks, with reports of hitting her head.  She also developed nausea/vomiting and diarrhea with a temperature of 102 within the past 5 days.  Her husband stated he called EMS when the patient had what appeared to him as a grand mal seizure the morning of 12-06-2019.  PCCM was consulted for further workup and management and the patient was admitted to the ICU.  Past Medical History  HTN Bipolar disorder PTSD Seizure disorder ETOH abuse Liver disease  Significant Hospital Events   2019/12/06 Started on 3% NaCl in ED, admit to ICU  Consults:  Neurology Nephrology  Procedures:    Significant Diagnostic Tests:  12/06/2019 CT head > negative Dec 06, 2019 CT abdomen >  enlarged fatty liver, colonic diverticulosis, aortic atherosclerosis  Micro Data:  10/18 BC >> 10/18 COVID 19 >> negative 10/18 Influenza A/B >> negative 10/18 UA >>   Antimicrobials:  10/18 Zosyn >>  Interim history/subjective:  Patient lying in bed, drowsy with slurred words, points to her upper abdomen when asked if she's hurting.  Labs/ Imaging personally reviewed Na+/ K+: 116/ 2.0 - 3% saline on hold, K replaced BUN/Cr.: <5/ <0.30 Hgb: 9.6 WBC/ TMAX: 11.9/ 37.1 Lactic: 10.9 Osmolality: 245 PCT: 1.20  CXR 10/18: Right lower lobe atelectasis, low lung volumes.  Objective   Blood pressure 122/81, pulse (!) 103, temperature 98.7 F (37.1 C), temperature source Oral, resp. rate (!) 21, height 5\' 3"  (1.6 m), weight 82.1 kg, SpO2 97 %.       No intake or output data in the 24 hours ending 12-06-19 1953 Filed Weights   12/06/2019 1328  Weight: 82.1 kg    Examination: General: Adult female, critically ill, lying in bed, NAD HEENT: MM pink/moist, icteric, atraumatic, neck supple Neuro: drowsy, oriented but with very delayed responses, able to follow commands, PERRL +3, MAE CV: s1s2, RRR, ST on monitor, no r/m/g Pulm: Regular, non labored on room air, breath sounds clear, diminished throughout GI: distended/ taut/ tender: BUQ & soft/ rounded/ non tender: BLQ, bs x 4 GU: purewick in place Skin: jaundice, scattered ecchymosis, no rashes/lesions noted Extremities: warm/dry, pulses + 2 R/P, no edema noted  Resolved Hospital Problem list     Assessment & Plan:  Severe Hyponatremia with reported seizure PMHx: seizure disorder, ETOH abuse Na+108 at admission, patient & husband reported a "grand mal"-like seizure at home on the morning of 10/18. Patient takes Oxcarbazepine at home, but husband states she stopped taking it 2 weeks ago. - Q 4 Sodium levels until stabilized - 3% NaCl at 75 mL/h discontinued after Na+ increased by 6 points in < 24 hours (108 ~ 116), consider  restarting at a lower rate if needed - nephrology consulted, appreciate input  Alcoholic Hepatitis PMHx: ETOH abuse Per pt.'s husband, pt began drinking again 2 weeks prior to admission. LUQ pain- Abdominal CT showed enlarged fatty liver. - Trend ammonia & hepatic panel - CIWA & ativan per protocol - restarted home medications: Rifaximin, lactulose - continue thiamine & folic acid   Sepsis of unknown origin  Lactic Acidosis Lactic > 11 on admission ~ 7.6, PCT: 1.20, abdominal CT negative for acute infection - f/u blood cultures, UA ordered- will culture if needed - Zosyn ordered for empiric coverage - trend PCT, trend lactic, monitor WBC/ fever curve  Seizures PMHx: seizure disorder, ETOH abuse Multifactorial in the setting of hyponatremia & ETOH abuse. Per husband patient stopped taking home anti-convulsant 2 weeks ago and began drinking.  Na+: 108 on arrival to ED. - NaCl 3% discontinued as Na+ increased from 108 to 116 - restart home medication oxcarbazepine - consult neurology, appreciate input  Best practice:  Diet: NPO, sips with meds Pain/Anxiety/Delirium protocol (if indicated): CIWA with ativan VAP protocol (if indicated): N/A DVT prophylaxis: heparin SQ & SCD's GI prophylaxis: protonix  Glucose control: monitor Q 4, target range 140-180 Mobility: bedrest Code Status: FULL Family Communication: patient updated bedside, husband updated via phone- 10/18  Disposition: ICU  Labs   CBC: Recent Labs  Lab 12-15-2019 1333  WBC 11.9*  HGB 9.6*  HCT 26.3*  MCV 101.5*  PLT 158    Basic Metabolic Panel: Recent Labs  Lab 2019/12/15 1333 Dec 15, 2019 1456 12/15/19 1527 12-15-19 1814  NA 108*  --  111* 111*  K <2.0*  --  <2.0*  --   CL <65*  --  <65*  --   CO2 24  --  23  --   GLUCOSE 147*  --  127*  --   BUN <5*  --  <5*  --   CREATININE <0.30*  --  <0.30*  --   CALCIUM 7.8*  --  7.8*  --   MG  --  1.7  --   --    GFR: CrCl cannot be calculated (This lab value  cannot be used to calculate CrCl because it is not a number: <0.30). Recent Labs  Lab 12/15/19 1333 2019-12-15 1549 12-15-2019 1804  WBC 11.9*  --   --   LATICACIDVEN  --  >11.0* >11.0*    Liver Function Tests: Recent Labs  Lab 12-15-19 1333  AST 313*  ALT 66*  ALKPHOS 476*  BILITOT 20.1*  PROT 6.3*  ALBUMIN 2.7*   No results for input(s): LIPASE, AMYLASE in the last 168 hours. Recent Labs  Lab 2019/12/15 1334  AMMONIA 106*    ABG    Component Value Date/Time   HCO3 29.5 (H) 12/15/2019 1804   O2SAT 78.6 December 15, 2019 1804     Coagulation Profile: Recent Labs  Lab 12-15-19 1456  INR 1.3*    Cardiac Enzymes: No results for input(s): CKTOTAL, CKMB, CKMBINDEX, TROPONINI in the last 168 hours.  HbA1C: Hgb A1c MFr Bld  Date/Time Value Ref Range  Status  10/17/2019 03:29 AM 5.2 4.8 - 5.6 % Final    Comment:    (NOTE) Pre diabetes:          5.7%-6.4%  Diabetes:              >6.4%  Glycemic control for   <7.0% adults with diabetes     CBG: No results for input(s): GLUCAP in the last 168 hours.  Review of Systems: positives in BOLD  Gen: Denies fever, chills, weight change, fatigue, night sweats HEENT: Denies blurred vision, double vision, hearing loss, tinnitus, sinus congestion, rhinorrhea, sore throat, neck stiffness, dysphagia PULM: Denies shortness of breath, cough, sputum production, hemoptysis, wheezing CV: Denies chest pain, edema, orthopnea, paroxysmal nocturnal dyspnea, palpitations GI: Denies abdominal pain, nausea, vomiting, diarrhea, hematochezia, melena, constipation, change in bowel habits GU: Denies dysuria, hematuria, polyuria, oliguria, urethral discharge Endocrine: Denies hot or cold intolerance, polyuria, polyphagia or appetite change Derm: Denies rash, dry skin, scaling or peeling skin change Heme: Denies easy bruising, bleeding, bleeding gums Neuro: Denies headache, numbness, weakness, slurred speech, loss of memory or consciousness  Past  Medical History  She,  has a past medical history of Alcoholism (HCC), Bipolar 1 disorder (HCC), Hypertension, Liver disease, PTSD (post-traumatic stress disorder), and Seizures (HCC).   Surgical History    Past Surgical History:  Procedure Laterality Date  . CHOLECYSTECTOMY       Social History   reports that she has been smoking cigarettes. She has been smoking about 1.00 pack per day. She has never used smokeless tobacco. She reports current alcohol use of about 36.0 standard drinks of alcohol per week. She reports previous drug use. Drugs: Cocaine and Marijuana.   Family History   Her family history includes Heart failure in her mother.   Allergies No Known Allergies   Home Medications  Prior to Admission medications   Medication Sig Start Date End Date Taking? Authorizing Provider  amLODipine (NORVASC) 5 MG tablet Take 1 tablet (5 mg total) by mouth daily. 10/17/19 01/09/2020 Yes Sheikh, Omair Latif, DO  Docusate Sodium (DSS) 100 MG CAPS Take 100 mg by mouth daily.    Yes [provider]  folic acid (FOLVITE) 1 MG tablet Take 1 tablet (1 mg total) by mouth daily. 10/18/19  Yes Sheikh, Omair Latif, DO  gabapentin (NEURONTIN) 100 MG capsule Take 1 capsule (100 mg total) by mouth 3 (three) times daily. 10/17/19  Yes Sheikh, Omair Latif, DO  hydrOXYzine (ATARAX/VISTARIL) 50 MG tablet Take 50 mg by mouth 3 (three) times daily as needed.   Yes [provider]  lisinopril (ZESTRIL) 10 MG tablet Take 1 tablet (10 mg total) by mouth daily. 10/18/19  Yes Sheikh, Omair Latif, DO  Melatonin 3 MG TABS Take 3 mg by mouth at bedtime.   Yes [provider]  Multiple Vitamin (MULTI-VITAMIN) tablet Take 1 tablet by mouth daily.   Yes [provider]  omeprazole (PRILOSEC) 20 MG capsule Take 20 mg by mouth daily.    Yes [provider]  ondansetron (ZOFRAN) 4 MG tablet Take 1 tablet (4 mg total) by mouth every 6 (six) hours as needed for nausea. 10/17/19  Yes  Sheikh, Omair Latif, DO  oxcarbazepine (TRILEPTAL) 600 MG tablet Take 1 tablet (600 mg total) by mouth 2 (two) times daily. 10/17/19  Yes Sheikh, Omair Latif, DO  rifaximin (XIFAXAN) 550 MG TABS tablet Take 1 tablet (550 mg total) by mouth 2 (two) times daily. 10/17/19  Yes Marguerita MerlesSheikh, Omair [place]if[place] HeightsLatif,  DO  sertraline (ZOLOFT) 50 MG tablet Take 1 tablet (50 mg total) by mouth daily. 10/18/19  Yes Sheikh, Omair Latif, DO  thiamine 100 MG tablet Take 100 mg by mouth daily.    Yes [provider]  zinc gluconate 50 MG tablet Take 50 mg by mouth daily.    Yes [provider]  clotrimazole (LOTRIMIN) 1 % external solution Apply 1 application topically 2 (two) times daily.    [provider]  diclofenac sodium (VOLTAREN) 1 % GEL Apply 2 g topically 4 (four) times daily as needed (pain).    [provider]  diphenhydrAMINE (BENADRYL) 25 mg capsule Take 25 mg by mouth every 6 (six) hours as needed for itching or allergies.  05/28/16   [provider]  feeding supplement, ENSURE ENLIVE, (ENSURE ENLIVE) LIQD Take 237 mLs by mouth 2 (two) times daily between meals. 10/17/19   Marguerita Merles Latif, DO  lactulose (CHRONULAC) 10 GM/15ML solution Take 30 mLs (20 g total) by mouth 2 (two) times daily as needed for mild constipation. 10/17/19   Merlene Laughter, DO     Critical care time: 45 minutes     Cheryll Cockayne Rust-Chester, AGACNP-BC [facility]    Please see Amion for pager details.

## 2019-11-21 NOTE — ED Notes (Signed)
Patient transported to CT 

## 2019-11-21 NOTE — ED Notes (Signed)
Pt stuck multiple times for 2nd IV to obtain needed labs. Unable to get any blood at this time. 2nd IV started and flushing. Lab called and spoke with Jaynie Collins they will send up someone to obtain needed labs.

## 2019-11-21 NOTE — Progress Notes (Signed)
PHARMACY CONSULT NOTE  Pharmacy Consult for Sodium monitoring for hypertonic saline  Recent Labs: Potassium (mmol/L)  Date Value  12/09/2019 <2.0 (LL)   Magnesium (mg/dL)  Date Value  49/67/5916 1.7   Calcium (mg/dL)  Date Value  38/46/6599 7.8 (L)   Albumin (g/dL)  Date Value  35/70/1779 2.7 (L)   Phosphorus (mg/dL)  Date Value  39/04/90 3.1   Sodium (mmol/L)  Date Value  12/09/2019 111 (LL)     Assessment: 39yo female with PMH bipolar disorder, HTN, alcohol abuse with alcoholic hepatitis who came to ED c/o seizure occurring ~0900 10/18. Patient reports drinking her usual amount including 5 shots this morning. Patient speech noted to be slow and deliberate. Skin and eyes noted to have yellow tone. Pharmacy has been consulted for monitoring serum Na while patient is on hypertonic saline.   10/18 @1333  Na 108 10/18 @1527  Na 111 10/18 @1814  Na 111  Goal of Therapy:  Increase Na by 4-77mEq/L in 4-6 hours  Plan:  Continue NaCL 3% infusion per MD at 75 ml/hr x 24 hrs (drip started at 1752 today 10/18 per [facility]) Monitor Na every 2 hours x 2 occurrences, then q4h Next Na at 2100  **Call RN to stop infusion and notify MD if Na increases by 4 mEq/L or more in the first 2 hours or if Na increases by 6 mEq/L or more in the first 4 hours   11/18, PharmD Pharmacy Resident  December 09, 2019 7:20 PM

## 2019-11-21 NOTE — ED Notes (Signed)
Date and time results received: December 01, 2019 "6:57 PM" (use smartphrase ".now" to insert current time)  Test: sodium Critical Value: 111  Name of Provider Notified: Scotty Court MD  Orders Received? Or Actions Taken?: Orders Received - See Orders for details

## 2019-11-21 NOTE — ED Notes (Signed)
Date and time results received: 2019/12/11 1701 (use smartphrase ".now" to insert current time)  Test: Lactic Critical Value: >11  Name of Provider Notified: Scotty Court MD  Orders Received? Or Actions Taken?: Orders Received - See Orders for details

## 2019-11-21 NOTE — ED Notes (Signed)
Date and time results received: 11/25/2019 "5:22 PM" (use smartphrase ".now" to insert current time)  Test: Potassium Critical Value: <2.0  Name of Provider Notified: Scotty Court MD  Orders Received? Or Actions Taken?: Orders Received - See Orders for details

## 2019-11-21 NOTE — Progress Notes (Addendum)
PHARMACY CONSULT NOTE  Pharmacy Consult for Sodium monitoring for hypertonic saline  Recent Labs: Potassium (mmol/L)  Date Value  11/25/2019 <2.0 (LL)   Magnesium (mg/dL)  Date Value  47/10/2955 2.3   Calcium (mg/dL)  Date Value  47/34/0370 7.8 (L)   Albumin (g/dL)  Date Value  96/43/8381 2.7 (L)   Phosphorus (mg/dL)  Date Value  84/04/7541 3.1   Sodium (mmol/L)  Date Value  11/29/2019 108 (LL)     Assessment: 39yo female with PMH bipolar disorder, HTN, alcohol abuse with alcoholic hepatitis who came to ED c/o seizure occurring ~0900 10/18. Patient reports drinking her usual amount including 5 shots this morning. Patient speech noted to be slow and deliberate. Skin and eyes noted to have yellow tone. Pharmacy has been consulted for monitoring serum Na while patient is on hypertonic saline.   10/18 @1333  Na 108 10/18 @1527  Na 111  Goal of Therapy:  Increase Na by 4-14mEq/L in 4-6 hours  Plan:  Start NaCL 3% infusion per MD at 75 ml/hr x 24 hrs (drip started at 1752 today 10/18 per MAR) Monitor Na every 2 hours x 2 occurrences, then q4h  **Call RN to stop infusion and notify MD if Na increases by 4 mEq/L or more in the first 2 hours or if Na increases by 6 mEq/L or more in the first 4 hours   11m, PharmD Pharmacy Resident  11/12/2019 3:35 PM

## 2019-11-21 NOTE — Consult Note (Addendum)
PHARMACY CONSULT NOTE - FOLLOW UP  Pharmacy Consult for Electrolyte Monitoring and Replacement   Recent Labs: Potassium (mmol/L)  Date Value  11/29/2019 2.0 (LL)   Magnesium (mg/dL)  Date Value  70/78/6754 1.5 (L)   Calcium (mg/dL)  Date Value  49/20/1007 7.8 (L)   Albumin (g/dL)  Date Value  01/22/7587 2.7 (L)   Phosphorus (mg/dL)  Date Value  32/54/9826 3.1   Sodium (mmol/L)  Date Value  11-29-2019 116 (LL)     Assessment: 39yo female with PMH bipolar disorder, HTN, alcohol abuse with alcoholic hepatitis who came to ED c/o seizure occurring ~0900 10/18. Patient reports drinking her usual amount including 5 shots this morning. Patient speech noted to be slow and deliberate. Skin and eyes noted to have yellow tone. Pharmacy has been consulted for monitoring serum Na while patient is on hypertonic saline and monitoring electrolytes.   Goal of Therapy:  WNL  Plan:  Holding 3% NaCl sodium bumped from 111 to 116. Recheck sodium in 2 hours.   Potassium is 2 and only 3 doses were given out of 6. Will order 10 mEq IV x 4.  Will give Mg 2g IV x 1. Pt received Mg 2 g at 2015.   Ronnald Ramp ,PharmD, BCPS Clinical Pharmacist November 29, 2019 10:00 PM

## 2019-11-21 NOTE — ED Notes (Signed)
Meal given at this time

## 2019-11-21 NOTE — ED Notes (Signed)
Date and time results received: 11/05/2019 "5:21 PM" (use smartphrase ".now" to insert current time)  Test: Chloride Critical Value: <65  Name of Provider Notified: Scotty Court MD  Orders Received? Or Actions Taken?: See Orders

## 2019-11-21 NOTE — ED Notes (Signed)
EDP Stafford at bedside at this time  °

## 2019-11-21 NOTE — Progress Notes (Signed)
eLink Physician-Brief Progress Note Patient Name: Ashlee Hawkins DOB: 10/27/1980 MRN: [id number]   Date of Service  11/24/2019  HPI/Events of Note  39yo female with PMH bipolar disorder, HTN, alcohol abuse with alcoholic hepatitis who came to ED c/o seizure occurring ~0900 10/18  On 3 % NS  Camera eval done. Comfortable. Obese, on 2 lit nasal o2.VS stable.  Data reviewed. Covid neg. Sodium 116 from 111  2hrs.  Pro cal 1.2 Flu neg. Tox neg. Urine osm 499, LA 10.9, down from 11. Low K/Mag. Anemia at 9.6, was 7.7 10/15/2019 CxR atelectasis rt lower zone. Obese.   1. Seizure from symptomatic Hyponatremia: could be combination of beer /etoh. Elevated LA could be from seizure and sepsis? Source not clear. CTH neg. CT abdomen: no acute findings.  2. BiPolar. Obese.  eICU Interventions  - as per bed side RN, NP critical care is following. - goal of correction sodium not more than 8 meq in first 24 hrs. - replace electrolytes. - follow UOP - on antibiotics. Trend LA. - keep MAP > 65. - on SQ heparin as VTE.  - aspiration and seizure precautions     Intervention Category Intermediate Interventions: Electrolyte abnormality - evaluation and management Evaluation Type: New Patient Evaluation  Ranee Gosselin 11/05/2019, 11:54 PM

## 2019-11-21 NOTE — ED Provider Notes (Signed)
[facility] Emergency Department Provider Note  ____________________________________________  Time seen: Approximately 3:20 PM  I have reviewed the triage vital signs and the nursing notes.   HISTORY  Chief Complaint Seizures, Alcohol Intoxication, and Jaundice    HPI Ashlee Hawkins is a 39 y.o. female with a history of bipolar disorder, hypertension, alcohol abuse with alcoholic hepatitis who comes the ED complaining of a seizure occurring at about 9 AM today.  She reports trying to detox, but then she started feeling bad so she resumed drinking her usual amount including 5 shots this morning.  Complains of left upper quadrant abdominal pain which is constant nonradiating moderate intensity without aggravating or alleviating factors, started this morning after drinking.  Denies hematemesis or black or bloody stool.      Past Medical History:  Diagnosis Date  . Alcoholism (HCC)   . Bipolar 1 disorder (HCC)   . Hypertension   . Liver disease   . PTSD (post-traumatic stress disorder)   . Seizures [facility])      Patient Active Problem List   Diagnosis Date Noted  . Alcoholic hepatitis 10/12/2019  . Hyponatremia 10/12/2019     Past Surgical History:  Procedure Laterality Date  . CHOLECYSTECTOMY       Prior to Admission medications   Medication Sig Start Date End Date Taking? Authorizing Provider  amLODipine (NORVASC) 5 MG tablet Take 1 tablet (5 mg total) by mouth daily. 10/17/19 11/16/19  Marguerita Merles Latif, DO  clotrimazole (LOTRIMIN) 1 % external solution Apply 1 application topically 2 (two) times daily.    [provider]  diclofenac sodium (VOLTAREN) 1 % GEL Apply 2 g topically 4 (four) times daily as needed (pain).    [provider]  diphenhydrAMINE (BENADRYL) 25 mg capsule Take 25 mg by mouth every 6 (six) hours as needed for itching or allergies.  05/28/16   [provider]  Docusate Sodium (DSS) 100 MG CAPS  Take 100 mg by mouth daily.     [provider]  feeding supplement, ENSURE ENLIVE, (ENSURE ENLIVE) LIQD Take 237 mLs by mouth 2 (two) times daily between meals. 10/17/19   Marguerita Merles Latif, DO  folic acid (FOLVITE) 1 MG tablet Take 1 tablet (1 mg total) by mouth daily. 10/18/19   Marguerita Merles Latif, DO  gabapentin (NEURONTIN) 100 MG capsule Take 1 capsule (100 mg total) by mouth 3 (three) times daily. 10/17/19   Marguerita Merles Latif, DO  hydrOXYzine (ATARAX/VISTARIL) 50 MG tablet Take 50 mg by mouth 3 (three) times daily as needed.    [provider]  lactulose (CHRONULAC) 10 GM/15ML solution Take 30 mLs (20 g total) by mouth 2 (two) times daily as needed for mild constipation. 10/17/19   Marguerita Merles Latif, DO  lisinopril (ZESTRIL) 10 MG tablet Take 1 tablet (10 mg total) by mouth daily. 10/18/19   Marguerita Merles Latif, DO  Melatonin 3 MG TABS Take 3 mg by mouth at bedtime.    [provider]  Multiple Vitamin (MULTI-VITAMIN) tablet Take 1 tablet by mouth daily.    [provider]  nicotine polacrilex (COMMIT) 2 MG lozenge Place 2 mg inside cheek every 2 (two) hours as needed.     [provider]  omeprazole (PRILOSEC) 20 MG capsule Take 20 mg by mouth daily.     [provider]  ondansetron (ZOFRAN) 4 MG tablet Take 1 tablet (4 mg total) by mouth every 6 (six) hours as needed for nausea.  10/17/19   Marguerita MerlesSheikh, Omair Latif, DO  oxcarbazepine (TRILEPTAL) 600 MG tablet Take 1 tablet (600 mg total) by mouth 2 (two) times daily. 10/17/19   Sheikh, Omair Latif, DO  rifaximin (XIFAXAN) 550 MG TABS tablet Take 1 tablet (550 mg total) by mouth 2 (two) times daily. 10/17/19   Marguerita MerlesSheikh, Omair Latif, DO  sertraline (ZOLOFT) 50 MG tablet Take 1 tablet (50 mg total) by mouth daily. 10/18/19   Marguerita MerlesSheikh, Omair Latif, DO  thiamine 100 MG tablet Take 100 mg by mouth daily.     [provider]  traZODone (DESYREL) 50 MG tablet Take 50 mg by mouth at bedtime.      [provider]  zinc gluconate 50 MG tablet Take 50 mg by mouth daily.     [provider]     Allergies Patient has no known allergies.   Family History  Problem Relation Age of Onset  . Heart failure Mother     Social History Social History   Tobacco Use  . Smoking status: Current Every Day Smoker    Packs/day: 1.00    Types: Cigarettes  . Smokeless tobacco: Never Used  Vaping Use  . Vaping Use: Never used  Substance Use Topics  . Alcohol use: Yes    Alcohol/week: 36.0 standard drinks    Types: 36 Shots of liquor per week  . Drug use: Not Currently    Types: Cocaine, Marijuana    Review of Systems  Constitutional:   No fever or chills.  ENT:   No sore throat. No rhinorrhea. Cardiovascular:   No chest pain or syncope. Respiratory:   No dyspnea or cough. Gastrointestinal: Positive as above for abdominal pain.  No diarrhea Musculoskeletal:   Negative for focal pain or swelling All other systems reviewed and are negative except as documented above in ROS and HPI.  ____________________________________________   PHYSICAL EXAM:  VITAL SIGNS: ED Triage Vitals  Enc Vitals Group     BP 12-09-19 1327 115/77     Pulse Rate 12-09-19 1327 (!) 115     Resp 12-09-19 1327 18     Temp 12-09-19 1327 98.7 F (37.1 C)     Temp Source 12-09-19 1327 Oral     SpO2 12-09-19 1327 97 %     Weight 12-09-19 1328 181 lb (82.1 kg)     Height 12-09-19 1328 5\' 3"  (1.6 m)     Head Circumference --      Peak Flow --      Pain Score 12-09-19 1327 0     Pain Loc --      Pain Edu? --      Excl. in GC? --     Vital signs reviewed, nursing assessments reviewed.   Constitutional:   Alert and oriented.  Ill-appearing Eyes:   Conjunctivae are icteric. EOMI. PERRL. ENT      Head:   Normocephalic and atraumatic.      Nose: Normal.      Mouth/Throat:   Dry mucous membranes      Neck:   No meningismus. Full ROM. Hematological/Lymphatic/Immunilogical:   No cervical  lymphadenopathy. Cardiovascular:   Tachycardia heart rate 110. Symmetric bilateral radial and DP pulses.  No murmurs. Cap refill less than 2 seconds. Respiratory:   Normal respiratory effort without tachypnea/retractions. Breath sounds are clear and equal bilaterally. No wheezes/rales/rhonchi. Gastrointestinal:   Soft with mild LUQ tenderness.  Mildly distended with ascites . Marland Kitchen.  No rebound, rigidity, or guarding.  Musculoskeletal:  Normal range of motion in all extremities. No joint effusions.  No lower extremity tenderness.  Trace bilateral lower extremity pitting edema Neurologic:   Normal speech and language.  Motor grossly intact. No acute focal neurologic deficits are appreciated.  Skin:    Skin is warm, dry and intact. No rash noted.  No petechiae, purpura, or bullae.  ____________________________________________    LABS (pertinent positives/negatives) (all labs ordered are listed, but only abnormal results are displayed) Labs Reviewed  COMPREHENSIVE METABOLIC PANEL - Abnormal; Notable for the following components:      Result Value   Sodium 108 (*)    Potassium <2.0 (*)    Chloride <65 (*)    Glucose, Bld 147 (*)    BUN <5 (*)    Creatinine, Ser <0.30 (*)    Calcium 7.8 (*)    Total Protein 6.3 (*)    Albumin 2.7 (*)    AST 313 (*)    ALT 66 (*)    Alkaline Phosphatase 476 (*)    Total Bilirubin 20.1 (*)    All other components within normal limits  ETHANOL - Abnormal; Notable for the following components:   Alcohol, Ethyl (B) 119 (*)    All other components within normal limits  ACETAMINOPHEN LEVEL - Abnormal; Notable for the following components:   Acetaminophen (Tylenol), Serum <10 (*)    All other components within normal limits  CBC - Abnormal; Notable for the following components:   WBC 11.9 (*)    RBC 2.59 (*)    Hemoglobin 9.6 (*)    HCT 26.3 (*)    MCV 101.5 (*)    MCH 37.1 (*)    MCHC 36.5 (*)    RDW 15.9 (*)    All other components within normal  limits  AMMONIA - Abnormal; Notable for the following components:   Ammonia 106 (*)    All other components within normal limits  PROTIME-INR - Abnormal; Notable for the following components:   Prothrombin Time 16.1 (*)    INR 1.3 (*)    All other components within normal limits  BLOOD GAS, VENOUS - Abnormal; Notable for the following components:   pH, Ven 7.51 (*)    pCO2, Ven 37 (*)    Bicarbonate 29.5 (*)    Acid-Base Excess 6.1 (*)    All other components within normal limits  LACTIC ACID, PLASMA - Abnormal; Notable for the following components:   Lactic Acid, Venous >11.0 (*)    All other components within normal limits  LACTIC ACID, PLASMA - Abnormal; Notable for the following components:   Lactic Acid, Venous >11.0 (*)    All other components within normal limits  BASIC METABOLIC PANEL - Abnormal; Notable for the following components:   Sodium 111 (*)    Potassium <2.0 (*)    Chloride <65 (*)    Glucose, Bld 127 (*)    BUN <5 (*)    Creatinine, Ser <0.30 (*)    Calcium 7.8 (*)    All other components within normal limits  OSMOLALITY - Abnormal; Notable for the following components:   Osmolality 245 (*)    All other components within normal limits  SODIUM - Abnormal; Notable for the following components:   Sodium 111 (*)    All other components within normal limits  CULTURE, BLOOD (ROUTINE X 2)  CULTURE, BLOOD (ROUTINE X 2)  RESPIRATORY PANEL BY RT PCR (FLU A&B, COVID)  SALICYLATE LEVEL  MAGNESIUM  URINE DRUG SCREEN, QUALITATIVE (  [facility] ONLY)  URINALYSIS, COMPLETE (UACMP) WITH MICROSCOPIC  SODIUM  OSMOLALITY, URINE  SODIUM, URINE, RANDOM  POC URINE PREG, ED   ____________________________________________   EKG  Interpreted by me Sinus tachycardia rate 103.  Normal axis.  Prolonged QTC of 576 ms.  Poor R wave progression.  Normal ST segments and T waves.  ____________________________________________    RADIOLOGY  DG Chest Portable 1 View  Result Date:  12-12-19 CLINICAL DATA:  Weakness.  Liver failure, confusion. EXAM: PORTABLE CHEST 1 VIEW COMPARISON:  10/24/2019 FINDINGS: Elevated right hemidiaphragm with right lower lobe atelectasis. Left lung is clear. Heart size and vascularity normal. IMPRESSION: Right lower lobe atelectasis.  No acute abnormality. Electronically Signed   By: Marlan Palau M.D.   On: Dec 12, 2019 17:39   US Abdomen Limited RUQ (LIVER/GB)  Result Date: 12-Dec-2019 CLINICAL DATA:  Bilirubinemia, alcoholism. EXAM: ULTRASOUND ABDOMEN LIMITED RIGHT UPPER QUADRANT COMPARISON:  10/24/2019 CT chest, abdomen and pelvis. 10/12/2019 abdominal ultrasound and prior. FINDINGS: Gallbladder: Surgically absent. Common bile duct: Diameter: 13 mm, compatible with post cholecystectomy sequela. Liver: No focal lesion identified. Heterogenous, echogenic parenchyma. Please note that ultrasound is limited in its sensitivity to evaluate a heterogenous liver for focal lesions. Portal vein is patent on color Doppler imaging with normal direction of blood flow towards the liver. Other: None. IMPRESSION: Heterogenous, echogenic hepatic parenchyma. No focal hepatic lesion. Post cholecystectomy sequela. Electronically Signed   By: Stana Bunting M.D.   On: Dec 12, 2019 15:45    ____________________________________________   PROCEDURES .Critical Care Performed by: Sharman Cheek, MD Authorized by: Sharman Cheek, MD   Critical care provider statement:    Critical care time (minutes):  35   Critical care time was exclusive of:  Separately billable procedures and treating other patients   Critical care was necessary to treat or prevent imminent or life-threatening deterioration of the following conditions:  Metabolic crisis, hepatic failure and CNS failure or compromise   Critical care was time spent personally by me on the following activities:  Development of treatment plan with patient or surrogate, discussions with consultants, evaluation of  patient's response to treatment, examination of patient, obtaining history from patient or surrogate, ordering and performing treatments and interventions, ordering and review of laboratory studies, ordering and review of radiographic studies, pulse oximetry, re-evaluation of patient's condition and review of old charts    ____________________________________________  DIFFERENTIAL DIAGNOSIS   Alcohol withdrawal, severe symptomatic hyponatremia, hepatic encephalopathy, alcoholic ketoacidosis and dehydration  CLINICAL IMPRESSION / ASSESSMENT AND PLAN / ED COURSE  Medications ordered in the ED: Medications  potassium chloride 10 mEq in 100 mL IVPB (10 mEq Intravenous New Bag/Given 2019-12-12 1900)  LORazepam (ATIVAN) injection 0-4 mg (2 mg Intravenous Given 12/12/19 1630)    Or  LORazepam (ATIVAN) tablet 0-4 mg ( Oral See Alternative 12-12-19 1630)  LORazepam (ATIVAN) injection 0-4 mg (has no administration in time range)    Or  LORazepam (ATIVAN) tablet 0-4 mg (has no administration in time range)  thiamine tablet 100 mg ( Oral See Alternative Dec 12, 2019 1632)    Or  thiamine (B-1) injection 100 mg (100 mg Intravenous Given 12-12-2019 1632)  sodium chloride (hypertonic) 3 % solution ( Intravenous New Bag/Given 12-Dec-2019 1752)  lactated ringers bolus 1,000 mL (1,000 mLs Intravenous New Bag/Given 12/12/2019 1637)  cefTRIAXone (ROCEPHIN) 2 g in sodium chloride 0.9 % 100 mL IVPB (2 g Intravenous New Bag/Given 2019-12-12 1738)    Pertinent labs & imaging results that were available during my care of the patient  were reviewed by me and considered in my medical decision making (see chart for details).  ILLYANA SCHORSCH was evaluated in Emergency Department on 11/30/2019 for the symptoms described in the history of present illness. She was evaluated in the context of the global COVID-19 pandemic, which necessitated consideration that the patient might be at risk for infection with the SARS-CoV-2 virus  that causes COVID-19. Institutional protocols and algorithms that pertain to the evaluation of patients at risk for COVID-19 are in a state of rapid change based on information released by regulatory bodies including the CDC and federal and state organizations. These policies and algorithms were followed during the patient's care in the ED.   Patient presents with bright icterus, high suspicion for liver failure with abdominal pain and seizure this morning.  She has had recent alcohol intake, and her ethanol level is 100.  Labs show that her sodium level is 108, seizure worrisome for symptomatic hyponatremia, requiring hypertonic saline to stabilize.  Labs further show a total bilirubin of 20, ammonia of 106, consistent with acute liver failure from continued drinking.  Potassium is less than 2, will need to give potassium replacement.  Will recheck chemistry panel now to ensure electrolyte results are accurate.   Will give IV fluids for hydration and plan to admit.  No apparent infectious source.  Doubt SBP.  Patient is not septic.   ----------------------------------------- 5:15 PM on 11/06/2019 -----------------------------------------  The patient is noted to have a lactate>4. With the current information available to me, I don't think the patient is in septic shock. The lactate>4, is related to seizure activity and liver failure .  We will obtain chest x-ray and urinalysis, give empiric ceftriaxone. Clinical Course as of Nov 20 1898  Mon Nov 21, 2019  1729 Repeat BMP confirms multiple severe electrolyte abnormalities.    [PS]    Clinical Course User Index [PS] Sharman Cheek, MD     ----------------------------------------- 7:00 PM on 11/17/2019 -----------------------------------------  Hypertonic saline infusing.  ICU team will evaluate for admission.  We will add on magnesium level and give initial 2 g magnesium replacement in addition to saline and IV  potassium.  ____________________________________________   FINAL CLINICAL IMPRESSION(S) / ED DIAGNOSES    Final diagnoses:  Bilirubinemia  Alcoholic cirrhosis of liver with ascites (HCC)  Acute hyponatremia  Hepatic encephalopathy [facility])     ED Discharge Orders    None      Portions of this note were generated with dragon dictation software. Dictation errors may occur despite best attempts at proofreading.   Sharman Cheek, MD 12/01/2019 1902

## 2019-11-21 NOTE — ED Triage Notes (Signed)
Pt to ER from home via EMS with reports of seizure today.  Pt reports drinking this AM.  Pt states she was drinking "no more than normal".  Pt speech is slow and deliberate, pt having difficulty answering questions.  Pt noted to have yellow tone to skin and eyes.  Pt states last drink 9AM.

## 2019-11-21 NOTE — ED Notes (Signed)
Date and time results received: 11/14/2019 2135 (use smartphrase ".now" to insert current time)  Test: Potassium 2.0, Sodium 116, Lactic 10.9 Critical Value: See above  Name of Provider Notified: Kasa MD

## 2019-11-21 NOTE — Progress Notes (Signed)
PHARMACY CONSULT NOTE  Pharmacy Consult for Sodium monitoring for hypertonic saline  Recent Labs: Potassium (mmol/L)  Date Value  11/26/2019 <2.0 (LL)   Magnesium (mg/dL)  Date Value  85/46/2703 1.7   Calcium (mg/dL)  Date Value  50/10/3816 7.8 (L)   Albumin (g/dL)  Date Value  29/93/7169 2.7 (L)   Phosphorus (mg/dL)  Date Value  67/89/3810 3.1   Sodium (mmol/L)  Date Value  11/15/2019 111 (LL)     Assessment: 39yo female with PMH bipolar disorder, HTN, alcohol abuse with alcoholic hepatitis who came to ED c/o seizure occurring ~0900 10/18. Patient reports drinking her usual amount including 5 shots this morning. Patient speech noted to be slow and deliberate. Skin and eyes noted to have yellow tone. Pharmacy has been consulted for monitoring serum Na while patient is on hypertonic saline.   10/18 @1333  Na 108 10/18 @1527  Na 111  Goal of Therapy:  Increase Na by 4-82mEq/L in 4-6 hours  Plan:  Start NaCL 3% infusion per MD at 75 ml/hr x 24 hrs (drip started at 1752 today 10/18 per [facility]) Monitor Na every 2 hours x 2 occurrences, then q4h Next Na at 1930  **Call RN to stop infusion and notify MD if Na increases by 4 mEq/L or more in the first 2 hours or if Na increases by 6 mEq/L or more in the first 4 hours   11/18 PharmD Clinical Pharmacist 11/15/2019

## 2019-11-21 NOTE — Progress Notes (Signed)
Pharmacy Antibiotic Note  Ashlee Hawkins is a 39 y.o. female admitted on 11/17/2019 with sepsis.  Pharmacy has been consulted for Zosyn dosing.  Plan: Zosyn 3.375g IV q8h (4 hour infusion).  Height: 5\' 3"  (160 cm) Weight: 82.1 kg (181 lb) IBW/kg (Calculated) : 52.4  Temp (24hrs), Avg:98.7 F (37.1 C), Min:98.7 F (37.1 C), Max:98.7 F (37.1 C)  Recent Labs  Lab 12/04/2019 1333 11/27/2019 1527 11/09/2019 1549 11/18/2019 1804 11/10/2019 2036  WBC 11.9*  --   --   --   --   CREATININE <0.30* <0.30*  --   --   --   LATICACIDVEN  --   --  >11.0* >11.0* 10.9*    CrCl cannot be calculated (This lab value cannot be used to calculate CrCl because it is not a number: <0.30).    No Known Allergies  Antimicrobials this admission:   >>    >>   Dose adjustments this admission:   Microbiology results:  BCx:   UCx:    Sputum:    MRSA PCR:   Thank you for allowing pharmacy to be a part of this patient's care.  [name] D 11/29/2019 11:34 PM

## 2019-11-21 NOTE — ED Triage Notes (Signed)
Pt in via EMS from home with c/o seizure 2.5 hours ago. Pt has hx of seizures and is non complaint with meds so she can drink alcohol. EMS reports pt is weak. Pt took 5 shots of ETOH this am and is attempting to detox. 150/78, FSBS 196, HR 110

## 2019-11-22 DIAGNOSIS — K7460 Unspecified cirrhosis of liver: Secondary | ICD-10-CM | POA: Diagnosis present

## 2019-11-22 DIAGNOSIS — K7031 Alcoholic cirrhosis of liver with ascites: Secondary | ICD-10-CM | POA: Diagnosis not present

## 2019-11-22 DIAGNOSIS — K7290 Hepatic failure, unspecified without coma: Secondary | ICD-10-CM

## 2019-11-22 DIAGNOSIS — R569 Unspecified convulsions: Secondary | ICD-10-CM | POA: Diagnosis not present

## 2019-11-22 DIAGNOSIS — E871 Hypo-osmolality and hyponatremia: Secondary | ICD-10-CM | POA: Diagnosis not present

## 2019-11-22 LAB — COMPREHENSIVE METABOLIC PANEL
ALT: 65 U/L — ABNORMAL HIGH (ref 0–44)
AST: 303 U/L — ABNORMAL HIGH (ref 15–41)
Albumin: 2.5 g/dL — ABNORMAL LOW (ref 3.5–5.0)
Alkaline Phosphatase: 432 U/L — ABNORMAL HIGH (ref 38–126)
Anion gap: 24 — ABNORMAL HIGH (ref 5–15)
BUN: <5 mg/dL — ABNORMAL LOW (ref 6–20)
CO2: 28 mmol/L (ref 22–32)
Calcium: 7.6 mg/dL — ABNORMAL LOW (ref 8.9–10.3)
Chloride: 65 mmol/L — ABNORMAL LOW (ref 98–111)
Creatinine, Ser: <0.3 mg/dL — ABNORMAL LOW (ref 0.44–1.00)
Glucose, Bld: 119 mg/dL — ABNORMAL HIGH (ref 70–99)
Potassium: 2.1 mmol/L — CL (ref 3.5–5.1)
Sodium: 117 mmol/L — CL (ref 135–145)
Total Bilirubin: 20.1 mg/dL (ref 0.3–1.2)
Total Protein: 6.2 g/dL — ABNORMAL LOW (ref 6.5–8.1)

## 2019-11-22 LAB — GLUCOSE, CAPILLARY
Glucose-Capillary: 106 mg/dL — ABNORMAL HIGH (ref 70–99)
Glucose-Capillary: 109 mg/dL — ABNORMAL HIGH (ref 70–99)
Glucose-Capillary: 121 mg/dL — ABNORMAL HIGH (ref 70–99)
Glucose-Capillary: 136 mg/dL — ABNORMAL HIGH (ref 70–99)
Glucose-Capillary: 137 mg/dL — ABNORMAL HIGH (ref 70–99)
Glucose-Capillary: 142 mg/dL — ABNORMAL HIGH (ref 70–99)

## 2019-11-22 LAB — SODIUM, URINE, RANDOM: Sodium, Ur: <10 mmol/L

## 2019-11-22 LAB — CBC
HCT: 25.9 % — ABNORMAL LOW (ref 36.0–46.0)
Hemoglobin: 9.3 g/dL — ABNORMAL LOW (ref 12.0–15.0)
MCH: 37.1 pg — ABNORMAL HIGH (ref 26.0–34.0)
MCHC: 35.9 g/dL (ref 30.0–36.0)
MCV: 103.2 fL — ABNORMAL HIGH (ref 80.0–100.0)
Platelets: 142 10*3/uL — ABNORMAL LOW (ref 150–400)
RBC: 2.51 MIL/uL — ABNORMAL LOW (ref 3.87–5.11)
RDW: 15.8 % — ABNORMAL HIGH (ref 11.5–15.5)
WBC: 10 10*3/uL (ref 4.0–10.5)
nRBC: 0.2 % (ref 0.0–0.2)

## 2019-11-22 LAB — BASIC METABOLIC PANEL
Anion gap: 16 — ABNORMAL HIGH (ref 5–15)
BUN: <5 mg/dL — ABNORMAL LOW (ref 6–20)
CO2: 34 mmol/L — ABNORMAL HIGH (ref 22–32)
Calcium: 7.5 mg/dL — ABNORMAL LOW (ref 8.9–10.3)
Chloride: 66 mmol/L — ABNORMAL LOW (ref 98–111)
Creatinine, Ser: 0.34 mg/dL — ABNORMAL LOW (ref 0.44–1.00)
GFR, Estimated: >60 mL/min (ref >60)
Glucose, Bld: 123 mg/dL — ABNORMAL HIGH (ref 70–99)
Potassium: 2.2 mmol/L — CL (ref 3.5–5.1)
Sodium: 116 mmol/L — CL (ref 135–145)

## 2019-11-22 LAB — POTASSIUM: Potassium: 2.4 mmol/L — CL (ref 3.5–5.1)

## 2019-11-22 LAB — SODIUM
Sodium: 114 mmol/L — CL (ref 135–145)
Sodium: 117 mmol/L — CL (ref 135–145)
Sodium: 118 mmol/L — CL (ref 135–145)

## 2019-11-22 LAB — PROCALCITONIN: Procalcitonin: 1.38 ng/mL

## 2019-11-22 LAB — MAGNESIUM: Magnesium: 2.9 mg/dL — ABNORMAL HIGH (ref 1.7–2.4)

## 2019-11-22 LAB — MRSA PCR SCREENING: MRSA by PCR: NEGATIVE

## 2019-11-22 LAB — LACTIC ACID, PLASMA: Lactic Acid, Venous: 7.6 mmol/L (ref 0.5–1.9)

## 2019-11-22 MED ORDER — POTASSIUM CHLORIDE IN NACL 20-0.9 MEQ/L-% IV SOLN
INTRAVENOUS | Status: DC
  Filled 2019-11-22 (×5): qty 1000

## 2019-11-22 MED ORDER — CHLORHEXIDINE GLUCONATE CLOTH 2 % EX PADS
6.0000 | MEDICATED_PAD | Freq: Every day | CUTANEOUS | Status: DC
  Administered 2019-11-22 – 2019-12-04 (×12): 6 via TOPICAL

## 2019-11-22 MED ORDER — SODIUM CHLORIDE 0.9% FLUSH: 10.0000 mL | INTRAVENOUS | Status: DC | PRN

## 2019-11-22 MED ORDER — POTASSIUM CHLORIDE 10 MEQ/100ML IV SOLN
10.0000 meq | INTRAVENOUS | Status: AC
  Administered 2019-11-22 (×6): 10 meq via INTRAVENOUS
  Filled 2019-11-22 (×6): qty 100

## 2019-11-22 MED ORDER — PANTOPRAZOLE SODIUM 40 MG IV SOLR
40.0000 mg | INTRAVENOUS | Status: DC
  Administered 2019-11-22 – 2019-12-01 (×11): 40 mg via INTRAVENOUS
  Filled 2019-11-22 (×11): qty 40

## 2019-11-22 MED ORDER — SODIUM CHLORIDE 0.9% FLUSH
10.0000 mL | Freq: Two times a day (BID) | INTRAVENOUS | Status: DC
  Administered 2019-11-22 – 2019-12-04 (×23): 10 mL

## 2019-11-22 MED ORDER — POTASSIUM CHLORIDE 10 MEQ/100ML IV SOLN
10.0000 meq | INTRAVENOUS | Status: AC
  Administered 2019-11-22 – 2019-11-23 (×4): 10 meq via INTRAVENOUS
  Filled 2019-11-22 (×4): qty 100

## 2019-11-22 MED ORDER — DEXTROSE 5 % IV SOLN: INTRAVENOUS | Status: DC

## 2019-11-22 NOTE — Consult Note (Addendum)
NEURO HOSPITALIST CONSULT NOTE   Requestig physician: Dr. Belia Heman  Reason for Consult: Breakthrough seizure  History obtained from:   Chart     HPI:                                                                                                                                          Ashlee Hawkins is an 39 y.o. female with a history of seizures, EtOH abuse and alcoholic hepatitis who presented to the ED after a seizure at home on Monday morning. Her last drink of alcohol was the same morning (5 shots Monday AM). Her EtOH use was endorsed as being a fifth of liquor per day (25 oz). She was severely hyponatremic in the ED, with a Na of 108. Also was hypokalemic with K of < 2. LFTs were consistent with her known diagnosis of alcoholic hepatitis. Ammonia was elevated at 106 and lactate was > 11. She was started on 3% saline solution IV but due to correction to 116 within 12 hours, it was discontinued.   Of note, her husband reported that she has stopped taking her anticonvulsant (Trileptal 600 mg BID) 2 weeks previously and then began drinking. Husband had witnessed what he termed "small" seizure like activity during this time where the patient would stare off and slur her words. She has also experienced multiple falls during the past two weeks, with reports of hitting her head. She has had LUQ pain and developed N/V and diarrhea with fever over the past 5 days.      EMS was called by husband after the patient had a GTC seizure on Monday morning.    Past Medical History:  Diagnosis Date  . Alcoholism (HCC)   . Bipolar 1 disorder (HCC)   . Hypertension   . Liver disease   . PTSD (post-traumatic stress disorder)   . Seizures (HCC)     Past Surgical History:  Procedure Laterality Date  . CHOLECYSTECTOMY      Family History  Problem Relation Age of Onset  . Heart failure Mother               Social History:  reports that she has been smoking cigarettes. She has  been smoking about 1.00 pack per day. She has never used smokeless tobacco. She reports current alcohol use of about 36.0 standard drinks of alcohol per week. She reports previous drug use. Drugs: Cocaine and Marijuana.  No Known Allergies  MEDICATIONS:  Prior to Admission:  Medications Prior to Admission  Medication Sig Dispense Refill Last Dose  . amLODipine (NORVASC) 5 MG tablet Take 1 tablet (5 mg total) by mouth daily. 30 tablet 0 Past Month at Unknown time  . Docusate Sodium (DSS) 100 MG CAPS Take 100 mg by mouth daily.    Past Month at Unknown time  . folic acid (FOLVITE) 1 MG tablet Take 1 tablet (1 mg total) by mouth daily. 30 tablet 0 Past Month at Unknown time  . gabapentin (NEURONTIN) 100 MG capsule Take 1 capsule (100 mg total) by mouth 3 (three) times daily. 90 capsule 0 Past Month at Unknown time  . hydrOXYzine (ATARAX/VISTARIL) 50 MG tablet Take 50 mg by mouth 3 (three) times daily as needed.   Past Month at Unknown time  . lisinopril (ZESTRIL) 10 MG tablet Take 1 tablet (10 mg total) by mouth daily. 30 tablet 0 Past Month at Unknown time  . Melatonin 3 MG TABS Take 3 mg by mouth at bedtime.   Past Month at Unknown time  . Multiple Vitamin (MULTI-VITAMIN) tablet Take 1 tablet by mouth daily.   Past Month at Unknown time  . omeprazole (PRILOSEC) 20 MG capsule Take 20 mg by mouth daily.    Past Month at Unknown time  . ondansetron (ZOFRAN) 4 MG tablet Take 1 tablet (4 mg total) by mouth every 6 (six) hours as needed for nausea. 20 tablet 0 Past Month at Unknown time  . oxcarbazepine (TRILEPTAL) 600 MG tablet Take 1 tablet (600 mg total) by mouth 2 (two) times daily. 30 tablet 0 Past Month at Unknown time  . rifaximin (XIFAXAN) 550 MG TABS tablet Take 1 tablet (550 mg total) by mouth 2 (two) times daily. 60 tablet 0 Past Month at Unknown time  . sertraline (ZOLOFT) 50 MG  tablet Take 1 tablet (50 mg total) by mouth daily. 30 tablet 0 Past Month at Unknown time  . thiamine 100 MG tablet Take 100 mg by mouth daily.    Past Month at Unknown time  . zinc gluconate 50 MG tablet Take 50 mg by mouth daily.    Past Month at Unknown time  . clotrimazole (LOTRIMIN) 1 % external solution Apply 1 application topically 2 (two) times daily.   prn at prn  . diclofenac sodium (VOLTAREN) 1 % GEL Apply 2 g topically 4 (four) times daily as needed (pain).   prn at prn  . diphenhydrAMINE (BENADRYL) 25 mg capsule Take 25 mg by mouth every 6 (six) hours as needed for itching or allergies.    prn at prn  . feeding supplement, ENSURE ENLIVE, (ENSURE ENLIVE) LIQD Take 237 mLs by mouth 2 (two) times daily between meals. 237 mL 12   . lactulose (CHRONULAC) 10 GM/15ML solution Take 30 mLs (20 g total) by mouth 2 (two) times daily as needed for mild constipation. 236 mL 0 prn at prn   Scheduled: . Chlorhexidine Gluconate Cloth  6 each Topical Daily  . folic acid  1 mg Oral Daily  . heparin  5,000 Units Subcutaneous Q8H  . lactulose  20 g Oral BID  . LORazepam  0-4 mg Intravenous Q6H   Or  . LORazepam  0-4 mg Oral Q6H  . [START ON 11/23/2019] LORazepam  0-4 mg Intravenous Q12H   Or  . [START ON 11/23/2019] LORazepam  0-4 mg Oral Q12H  . oxcarbazepine  600 mg Oral BID  . pantoprazole (PROTONIX) IV  40 mg Intravenous Q24H  .  rifaximin  550 mg Oral BID  . thiamine  100 mg Oral Daily   Or  . thiamine  100 mg Intravenous Daily   Continuous: . dextrose 50 mL/hr at 11/22/19 0600  . piperacillin-tazobactam (ZOSYN)  IV 3.375 g (11/22/19 0743)  . potassium chloride 10 mEq (11/22/19 0844)     ROS:                                                                                                                                       Unable to obtain due to AMS.    Blood pressure 127/78, pulse (!) 112, temperature 98.4 F (36.9 C), temperature source Axillary, resp. rate (!) 26, height 5'  3" (1.6 m), weight 90.1 kg, SpO2 94 %.   General Examination:                                                                                                       Physical Exam  HEENT-  Normocephalic. Moon facies noted. Scleral icterus and jaundice noted.    Lungs- Respirations unlabored Extremities- No edema  Neurological Examination Mental Status: Drowsy to somnolent. Speech is slow and mildly dysarthric. Increased latencies of motor and verbal responses. Able to follow all basic motor commands and answer some simple questions. Not oriented to day of week or year, but is oriented to the month. Perseverates at times. Has difficulty answering orientation questions regarding the state and city.  Cranial Nerves: II: Visual fields grossly with no extinction to DSS. Pupils 5 mm and reactive bilaterally.  III,IV, VI: No ptosis. Gaze is conjugate.  V,VII: Smile symmetric, facial temp sensation equal bilaterally VIII: Hearing intact to voice IX,X: No hypophonia XI: Symmetric shoulder shrug XII: Midline tongue extension Motor: Right : Upper extremity   4+/5    Left:     Upper extremity   4+/5  Lower extremity   4+/5    Lower extremity   4+/5 Sensory: Temp and light touch intact throughout, bilaterally. No extinction to DSS.  Deep Tendon Reflexes: 3+ and symmetric brachioradialis and patellae Plantars: Right: downgoing   Left: downgoing Cerebellar: Bradykinetic with no gross ataxia when performing FNF bilaterally  Gait: Deferred   Lab Results: Basic Metabolic Panel: Recent Labs  Lab 11/27/2019 1333 12/01/2019 1333 11/16/2019 1456 11/30/2019 1527 11/23/2019 1814 11/10/2019 2036 11/22/19 0048 11/22/19 0428  NA 108*   < >  --  111* 111* 116* 117* 116*  K <2.0*  --   --  <2.0*  --  2.0* 2.1* 2.2*  CL <65*  --   --  <65*  --   --  65* 66*  CO2 24  --   --  23  --   --  28 34*  GLUCOSE 147*  --   --  127*  --   --  119* 123*  BUN <5*  --   --  <5*  --   --  <5* <5*  CREATININE <0.30*  --   --   <0.30*  --   --  <0.30* 0.34*  CALCIUM 7.8*   < >  --  7.8*  --   --  7.6* 7.5*  MG  --   --  1.7  --   --  1.5* 2.9*  --    < > = values in this interval not displayed.    CBC: Recent Labs  Lab 12/19/2019 1333 11/22/19 0048  WBC 11.9* 10.0  HGB 9.6* 9.3*  HCT 26.3* 25.9*  MCV 101.5* 103.2*  PLT 158 142*    Cardiac Enzymes: No results for input(s): CKTOTAL, CKMB, CKMBINDEX, TROPONINI in the last 168 hours.  Lipid Panel: No results for input(s): CHOL, TRIG, HDL, CHOLHDL, VLDL, LDLCALC in the last 168 hours.  Imaging: CT ABDOMEN PELVIS WO CONTRAST  Result Date: 12/19/19 CLINICAL DATA:  Left upper quadrant pain. EXAM: CT ABDOMEN AND PELVIS WITHOUT CONTRAST TECHNIQUE: Multidetector CT imaging of the abdomen and pelvis was performed following the standard protocol without IV contrast. COMPARISON:  October 24, 2019 FINDINGS: Lower chest: No acute abnormality. Hepatobiliary: The liver is enlarged with diffuse fatty infiltration of the liver parenchyma. No focal liver abnormality is seen. Status post cholecystectomy. No biliary dilatation. Pancreas: Unremarkable. No pancreatic ductal dilatation or surrounding inflammatory changes. Spleen: Normal in size without focal abnormality. Adrenals/Urinary Tract: Adrenal glands are unremarkable. Kidneys are normal, without renal calculi, focal lesion, or hydronephrosis. Bladder is unremarkable. Stomach/Bowel: Stomach is within normal limits. Appendix appears normal. No evidence of bowel wall thickening, distention, or inflammatory changes. Noninflamed diverticula are seen within the descending and sigmoid colon. Vascular/Lymphatic: There is moderate severity calcification of the abdominal aorta and bilateral common iliac arteries, without evidence of aneurysmal dilatation. No enlarged abdominal or pelvic lymph nodes. Reproductive: Uterus and bilateral adnexa are unremarkable. Other: No abdominal wall hernia or abnormality. No abdominopelvic ascites.  Musculoskeletal: No acute or significant osseous findings. IMPRESSION: 1. Enlarged fatty liver. 2. Colonic diverticulosis. 3. Aortic atherosclerosis. Aortic Atherosclerosis (ICD10-I70.0). Electronically Signed   By: Aram Candela M.D.   On: 2019/12/19 21:50   CT Head Wo Contrast  Result Date: 12/19/19 CLINICAL DATA:  Seizure. EXAM: CT HEAD WITHOUT CONTRAST TECHNIQUE: Contiguous axial images were obtained from the base of the skull through the vertex without intravenous contrast. COMPARISON:  October 24, 2019 FINDINGS: Brain: No evidence of acute infarction, hemorrhage, hydrocephalus, extra-axial collection or mass lesion/mass effect. Vascular: No hyperdense vessel or unexpected calcification. Skull: Normal. Negative for fracture or focal lesion. Sinuses/Orbits: No acute finding. Other: None. IMPRESSION: No acute intracranial pathology. Electronically Signed   By: Aram Candela M.D.   On: 12-19-2019 21:42   DG Chest Portable 1 View  Result Date: 12-19-2019 CLINICAL DATA:  Weakness.  Liver failure, confusion. EXAM: PORTABLE CHEST 1 VIEW COMPARISON:  10/24/2019 FINDINGS: Elevated right hemidiaphragm with right lower lobe atelectasis. Left lung is clear. Heart size and vascularity normal. IMPRESSION: Right lower lobe atelectasis.  No acute abnormality. Electronically Signed   By: Marlan Palau M.D.   On: Dec 19, 2019  17:39   US Abdomen Limited RUQ (LIVER/GB)  Result Date: 11/19/2019 CLINICAL DATA:  Bilirubinemia, alcoholism. EXAM: ULTRASOUND ABDOMEN LIMITED RIGHT UPPER QUADRANT COMPARISON:  10/24/2019 CT chest, abdomen and pelvis. 10/12/2019 abdominal ultrasound and prior. FINDINGS: Gallbladder: Surgically absent. Common bile duct: Diameter: 13 mm, compatible with post cholecystectomy sequela. Liver: No focal lesion identified. Heterogenous, echogenic parenchyma. Please note that ultrasound is limited in its sensitivity to evaluate a heterogenous liver for focal lesions. Portal vein is patent  on color Doppler imaging with normal direction of blood flow towards the liver. Other: None. IMPRESSION: Heterogenous, echogenic hepatic parenchyma. No focal hepatic lesion. Post cholecystectomy sequela. Electronically Signed   By: Stana Bunting M.D.   On: 11/27/2019 15:45    Assessment: 39 year old female with breakthrough seizure in the setting of heavy EtOH abuse and severe electrolyte disturbance, as well as noncompliance with her anticonvulsant 1. Encephalopathic on exam today. No lateralized motor or sensory deficit seen.  2. CT head shows no acute intracranial abnormality  Recommendations: 1. EtOH withdrawal treatment per ICU team  2. Gradual Na correction. Do not correct by > 10 meq/L per day or by > 0.5 meq/L/hr.  3. Trileptal has been restarted at her home dosage regimen 4. EEG  5. Continue home Neurontin.  6. Monitor ammonia levels. Treatment of hyperammonemia per ICU team 7. Inpatient seizure precautions. 8. Outpatient seizure precautions: Per [facility] statutes, patients with seizures are not allowed to drive until  they have been seizure-free for six months. Use caution when using heavy equipment or power tools. Avoid working on ladders or at heights. Take showers instead of baths. Ensure the water temperature is not too high on the home water heater. Do not go swimming alone. When caring for infants or small children, sit down when holding, feeding, or changing them to minimize risk of injury to the child in the event you have a seizure. Also, Maintain good sleep hygiene. Avoid alcohol.  40 minutes spent in the neurological evaluation and management of this critically ill patient.   Addendum: EEG findings: ABNORMALITY: Continuous  slow, generalized IMPRESSION: This study is suggestive of mild diffuse encephalopathy, nonspecific etiology. No seizures or epileptiform discharges were seen throughout the recording.   Electronically signed: Dr. Caryl Pina 11/22/2019, 8:39 AM

## 2019-11-22 NOTE — Consult Note (Signed)
PHARMACY CONSULT NOTE - FOLLOW UP  Pharmacy Consult for Electrolyte Monitoring and Replacement   Recent Labs: Potassium (mmol/L)  Date Value  11/22/2019 2.2 (LL)   Magnesium (mg/dL)  Date Value  54/62/7035 2.9 (H)   Calcium (mg/dL)  Date Value  00/93/8182 7.5 (L)   Albumin (g/dL)  Date Value  99/37/1696 2.5 (L)   Phosphorus (mg/dL)  Date Value  78/93/8101 3.1   Sodium (mmol/L)  Date Value  11/22/2019 114 (LL)     Assessment: 39yo female with PMH bipolar disorder, HTN, alcohol abuse with alcoholic hepatitis who came to ED c/o seizure occurring ~0900 10/18. Patient reports drinking her usual amount including 5 shots this morning. Patient speech noted to be slow and deliberate. Skin and eyes noted to have yellow tone. Pharmacy has been consulted for monitoring serum Na while patient is on hypertonic saline and monitoring electrolytes.   Goal of Therapy:  WNL  Plan:  Hypertonic saline on hold for rise in Na 111>116. Patient was placed on D5. Stat Na level ~ 1000 at 114. D5 discontinued, nephrology consult. Patient placed on NS at 75 ml/hr. Na checks q4h.  Pricilla Riffle ,PharmD Clinical Pharmacist 11/22/2019 3:56 PM

## 2019-11-22 NOTE — Consult Note (Signed)
PHARMACY CONSULT NOTE - FOLLOW UP  Pharmacy Consult for Electrolyte Monitoring and Replacement   Recent Labs: Potassium (mmol/L)  Date Value  11/22/2019 2.2 (LL)   Magnesium (mg/dL)  Date Value  11/24/1171 2.9 (H)   Calcium (mg/dL)  Date Value  56/70/1410 7.5 (L)   Albumin (g/dL)  Date Value  30/13/1438 2.5 (L)   Phosphorus (mg/dL)  Date Value  88/75/7972 3.1   Sodium (mmol/L)  Date Value  11/22/2019 116 (LL)     Assessment: 39yo female with PMH bipolar disorder, HTN, alcohol abuse with alcoholic hepatitis who came to ED c/o seizure occurring ~0900 10/18. Patient reports drinking her usual amount including 5 shots this morning. Patient speech noted to be slow and deliberate. Skin and eyes noted to have yellow tone. Pharmacy has been consulted for monitoring serum Na while patient is on hypertonic saline and monitoring electrolytes.   Goal of Therapy:  WNL  Plan:  Holding 3% NaCl sodium bumped from 111 to 116. Recheck sodium in 2 hours.   Potassium is 2 and only 3 doses were given out of 6. Will order 10 mEq IV x 4.  Will give Mg 2g IV x 1. Pt received Mg 2 g at 2015.   10/19:  K @ 0428 = 2.2  Will order KCl 10 mEq IV X 6 to start on 10/19 @ 0615.  Will recheck K on 10/19 @ 1400.   Scherrie Gerlach ,PharmD Clinical Pharmacist 11/22/2019 5:27 AM

## 2019-11-22 NOTE — Progress Notes (Signed)
Critical potassium of 2.2 and NA of 116, reported to [facility] NP, and to Pharmacy for electrolyte replacement.

## 2019-11-22 NOTE — Procedures (Signed)
Patient Name: Ashlee Hawkins  MRN: [id number]  Epilepsy Attending: Charlsie Quest  Referring Physician/Provider: Harlon Ditty, NP Date: 11/22/2019 Duration: 26.14 mins  Patient history: 39 year old female with breakthrough seizure in the setting of heavy EtOH abuse and severe electrolyte disturbance, as well as noncompliance with her anticonvulsant. EEG to evaluate for seizure  Level of alertness: Awake  AEDs during EEG study: OXC, Ativan  Technical aspects: This EEG study was done with scalp electrodes positioned according to the 10-20 International system of electrode placement. Electrical activity was acquired at a sampling rate of 500Hz  and reviewed with a high frequency filter of 70Hz  and a low frequency filter of 1Hz . EEG data were recorded continuously and digitally stored.   Description: The posterior dominant rhythm consists of 9 Hz activity of moderate voltage (25-35 uV) seen predominantly in posterior head regions, symmetric and reactive to eye opening and eye closing.  EEG showed continuous generalized 3 to 6 Hz theta-delta slowing. Hyperventilation and photic stimulation were not performed.     ABNORMALITY -Continuous  slow, generalized  IMPRESSION: This study is suggestive of mild diffuse encephalopathy, nonspecific etiology. No seizures or epileptiform discharges were seen throughout the recording.   Dong Nimmons 

## 2019-11-22 NOTE — Progress Notes (Signed)
eeg done °

## 2019-11-22 NOTE — Consult Note (Signed)
PHARMACY CONSULT NOTE - FOLLOW UP  Pharmacy Consult for Electrolyte Monitoring and Replacement   Recent Labs: Potassium (mmol/L)  Date Value  11/22/2019 2.2 (LL)   Magnesium (mg/dL)  Date Value  01/22/7587 2.9 (H)   Calcium (mg/dL)  Date Value  32/54/9826 7.5 (L)   Albumin (g/dL)  Date Value  41/58/3094 2.5 (L)   Phosphorus (mg/dL)  Date Value  07/68/0881 3.1   Sodium (mmol/L)  Date Value  11/22/2019 114 (LL)     Assessment: 39yo female with PMH bipolar disorder, HTN, alcohol abuse with alcoholic hepatitis who came to ED c/o seizure occurring ~0900 10/18. Patient reports drinking her usual amount including 5 shots this morning. Patient speech noted to be slow and deliberate. Skin and eyes noted to have yellow tone. Pharmacy has been consulted for monitoring serum Na while patient is on hypertonic saline and monitoring electrolytes.   RN called and stated K+ was 2.4 and Na was 117.   Goal of Therapy:  WNL  Plan:  Hypertonic saline on hold. Nephrology consult. Started on NS with KCl 20 mEq at 75 mL/hr. Na checks q4h.  KCl 10 mEq x 4   F/u with electrolytes in the AM.   Ronnald Ramp ,PharmD, BCPS Clinical Pharmacist 11/22/2019 7:55 PM

## 2019-11-22 NOTE — Consult Note (Signed)
[address] [place], [place] [address] Phone [phone]. Fax [phone]  Date: 11/22/2019                  Patient Name:  Ashlee GinMelissa N Hawkins  MRN: [id number]  DOB: 08/22/1980  Age / Sex: 39 y.o., female         [facility]                 Service Requesting Consult: IM/ Erin FullingKasa, Kurian, MD                 Reason for Consult:  Hyponatremia, hypokalemia            History of Present Illness: Patient is a 39 y.o. female with medical problems of alcohol abuse, who was [facility] to [facility] on 06-Mar-2019 for evaluation of seizures.  Patient is very lethargic and information is difficult to obtain.  Most of the information is obtained from the chart.  Patient reports drinking at least 20 drinks in a day. Nephrology consult was requested for severe electrolytes abnormalities including hyponatremia, hypokalemia, low chloride, hypocalcemia.  Upon arrival patient's lactic acid was elevated at greater than 11.  She was initially placed on 3% saline but that was discontinued, due to faster rise of sodium.  She was then placed on D5.  Now her sodium level is at 114, therefore nephrology consult has been requested for further recommendations   Medications: Outpatient medications: Medications Prior to Admission  Medication Sig Dispense Refill Last Dose  . amLODipine (NORVASC) 5 MG tablet Take 1 tablet (5 mg total) by mouth daily. 30 tablet 0 Past Month at Unknown time  . Docusate Sodium (DSS) 100 MG CAPS Take 100 mg by mouth daily.    Past Month at Unknown time  . folic acid (FOLVITE) 1 MG tablet Take 1 tablet (1 mg total) by mouth daily. 30 tablet 0 Past Month at Unknown time  . gabapentin (NEURONTIN) 100 MG capsule Take 1 capsule (100 mg total) by mouth 3 (three) times daily. 90 capsule 0 Past Month at Unknown time  . hydrOXYzine (ATARAX/VISTARIL) 50 MG tablet Take 50 mg by mouth 3 (three) times daily as needed.   Past Month at Unknown time  . lisinopril (ZESTRIL) 10  MG tablet Take 1 tablet (10 mg total) by mouth daily. 30 tablet 0 Past Month at Unknown time  . Melatonin 3 MG TABS Take 3 mg by mouth at bedtime.   Past Month at Unknown time  . Multiple Vitamin (MULTI-VITAMIN) tablet Take 1 tablet by mouth daily.   Past Month at Unknown time  . omeprazole (PRILOSEC) 20 MG capsule Take 20 mg by mouth daily.    Past Month at Unknown time  . ondansetron (ZOFRAN) 4 MG tablet Take 1 tablet (4 mg total) by mouth every 6 (six) hours as needed for nausea. 20 tablet 0 Past Month at Unknown time  . oxcarbazepine (TRILEPTAL) 600 MG tablet Take 1 tablet (600 mg total) by mouth 2 (two) times daily. 30 tablet 0 Past Month at Unknown time  . rifaximin (XIFAXAN) 550 MG TABS tablet Take 1 tablet (550 mg total) by mouth 2 (two) times daily. 60 tablet 0 Past Month at Unknown time  . sertraline (ZOLOFT) 50 MG tablet Take 1 tablet (50 mg total) by mouth daily. 30 tablet 0 Past Month at Unknown time  . thiamine 100 MG tablet Take 100 mg by mouth daily.    Past Month at Unknown time  .  zinc gluconate 50 MG tablet Take 50 mg by mouth daily.    Past Month at Unknown time  . clotrimazole (LOTRIMIN) 1 % external solution Apply 1 application topically 2 (two) times daily.   prn at prn  . diclofenac sodium (VOLTAREN) 1 % GEL Apply 2 g topically 4 (four) times daily as needed (pain).   prn at prn  . diphenhydrAMINE (BENADRYL) 25 mg capsule Take 25 mg by mouth every 6 (six) hours as needed for itching or allergies.    prn at prn  . feeding supplement, ENSURE ENLIVE, (ENSURE ENLIVE) LIQD Take 237 mLs by mouth 2 (two) times daily between meals. 237 mL 12   . lactulose (CHRONULAC) 10 GM/15ML solution Take 30 mLs (20 g total) by mouth 2 (two) times daily as needed for mild constipation. 236 mL 0 prn at prn    Current medications: Current Facility-Administered Medications  Medication Dose Route Frequency Provider Last Rate Last Admin  . 0.9 % NaCl with KCl 20 mEq/ L  infusion   Intravenous  Continuous Mosetta Pigeon, MD      . Chlorhexidine Gluconate Cloth 2 % PADS 6 each  6 each Topical Daily Erin Fulling, MD   6 each at 11/22/19 0952  . docusate sodium (COLACE) capsule 100 mg  100 mg Oral BID PRN Rust-Chester, Cecelia Byars, NP      . folic acid (FOLVITE) tablet 1 mg  1 mg Oral Daily Rust-Chester, Britton L, NP   1 mg at 11/22/19 0953  . heparin injection 5,000 Units  5,000 Units Subcutaneous Q8H Rust-Chester, Britton L, NP   5,000 Units at 11/22/19 0559  . lactulose (CHRONULAC) 10 GM/15ML solution 20 g  20 g Oral BID Rust-Chester, Britton L, NP   20 g at 11/22/19 0954  . LORazepam (ATIVAN) injection 0-4 mg  0-4 mg Intravenous Q6H Gilles Chiquito, MD   2 mg at 11/07/2019 1630   Or  . LORazepam (ATIVAN) tablet 0-4 mg  0-4 mg Oral Q6H Gilles Chiquito, MD   2 mg at 11/22/19 0954  . [START ON 11/23/2019] LORazepam (ATIVAN) injection 0-4 mg  0-4 mg Intravenous Q12H Gilles Chiquito, MD       Or  . Melene Muller ON 11/23/2019] LORazepam (ATIVAN) tablet 0-4 mg  0-4 mg Oral Q12H Gilles Chiquito, MD      . ondansetron [facility]) injection 4 mg  4 mg Intravenous Q6H PRN Rust-Chester, Cecelia Byars, NP      . Oxcarbazepine (TRILEPTAL) tablet 600 mg  600 mg Oral BID Rust-Chester, Britton L, NP   600 mg at 11/22/19 0954  . pantoprazole (PROTONIX) injection 40 mg  40 mg Intravenous Q24H Rust-Chester, Britton L, NP   40 mg at 11/22/19 0348  . piperacillin-tazobactam (ZOSYN) IVPB 3.375 g  3.375 g Intravenous Q8H Kasa, Wallis Bamberg, MD 12.5 mL/hr at 11/22/19 0743 3.375 g at 11/22/19 0743  . polyethylene glycol (MIRALAX / GLYCOLAX) packet 17 g  17 g Oral Daily PRN Rust-Chester, Britton L, NP      . rifaximin (XIFAXAN) tablet 550 mg  550 mg Oral BID Rust-Chester, Britton L, NP   550 mg at 11/22/19 0954  . thiamine tablet 100 mg  100 mg Oral Daily Gilles Chiquito, MD       Or  . thiamine (B-1) injection 100 mg  100 mg Intravenous Daily Gilles Chiquito, MD   100 mg at 11/22/19 0950      Allergies: No Known Allergies  Past Medical History: Past Medical History:  Diagnosis Date  . Alcoholism (HCC)   . Bipolar 1 disorder (HCC)   . Hypertension   . Liver disease   . PTSD (post-traumatic stress disorder)   . Seizures (HCC)      Past Surgical History: Past Surgical History:  Procedure Laterality Date  . CHOLECYSTECTOMY       Family History: Family History  Problem Relation Age of Onset  . Heart failure Mother      Social History: Social History   Socioeconomic History  . Marital status: Married    Spouse name: Not on file  . Number of children: Not on file  . Years of education: Not on file  . Highest education level: Not on file  Occupational History  . Not on file  Tobacco Use  . Smoking status: Current Every Day Smoker    Packs/day: 1.00    Types: Cigarettes  . Smokeless tobacco: Never Used  Vaping Use  . Vaping Use: Never used  Substance and Sexual Activity  . Alcohol use: Yes    Alcohol/week: 36.0 standard drinks    Types: 36 Shots of liquor per week  . Drug use: Not Currently    Types: Cocaine, Marijuana  . Sexual activity: Not on file  Other Topics Concern  . Not on file  Social History Narrative  . Not on file   Social Determinants of Health   Financial Resource Strain:   . Difficulty of Paying Living Expenses: Not on file  Food Insecurity:   . Worried About Programme researcher, broadcasting/film/video in the Last Year: Not on file  . Ran Out of Food in the Last Year: Not on file  Transportation Needs:   . Lack of Transportation (Medical): Not on file  . Lack of Transportation (Non-Medical): Not on file  Physical Activity:   . Days of Exercise per Week: Not on file  . Minutes of Exercise per Session: Not on file  Stress:   . Feeling of Stress : Not on file  Social Connections:   . Frequency of Communication with Friends and Family: Not on file  . Frequency of Social Gatherings with Friends and Family: Not on file  . Attends Religious Services: Not on file  . Active Member  of Clubs or Organizations: Not on file  . Attends Banker Meetings: Not on file  . Marital Status: Not on file  Intimate Partner Violence:   . Fear of Current or Ex-Partner: Not on file  . Emotionally Abused: Not on file  . Physically Abused: Not on file  . Sexually Abused: Not on file     Review of Systems: Not reliably obtained as patient is very lethargic and slow to answer Gen:  HEENT:  CV:  Resp:  GI: GU :  MS:  Derm:    Psych: Heme:  Neuro:  Endocrine  Vital Signs: Blood pressure 127/78, pulse (!) 106, temperature 98.2 F (36.8 C), temperature source Axillary, resp. rate (!) 26, height 5\' 3"  (1.6 m), weight 90.1 kg, SpO2 94 %.   Intake/Output Summary (Last 24 hours) at 11/22/2019 1318 Last data filed at 11/22/2019 1201 Gross per 24 hour  Intake 1435.29 ml  Output 295 ml  Net 1140.29 ml    Weight trends: Filed Weights   11/16/2019 1328 11/28/2019 2348 11/22/19 0441  Weight: 82.1 kg 90.1 kg 90.1 kg    Physical Exam: General:  Ill-appearing, laying in the bed  HEENT  scleral icterus, dry  oral mucous membranes  Lungs:  Coarse breath sounds  Heart::  No rub  Abdomen:  Soft, nontender  Extremities:  Trace edema  Neurologic:  Lethargic, arousable  Skin:  Jaundiced    Lab results: Basic Metabolic Panel: Recent Labs  Lab 13-Dec-2019 1333 2019-12-13 1456 13-Dec-2019 1527 13-Dec-2019 1814 12/13/2019 2036 December 13, 2019 2036 11/22/19 0048 11/22/19 0428 11/22/19 1024  NA   < >  --  111*   < > 116*   < > 117* 116* 114*  K   < >  --  <2.0*   < > 2.0*  --  2.1* 2.2*  --   CL   < >  --  <65*  --   --   --  65* 66*  --   CO2   < >  --  23  --   --   --  28 34*  --   GLUCOSE   < >  --  127*  --   --   --  119* 123*  --   BUN   < >  --  <5*  --   --   --  <5* <5*  --   CREATININE   < >  --  <0.30*  --   --   --  <0.30* 0.34*  --   CALCIUM   < >  --  7.8*  --   --   --  7.6* 7.5*  --   MG  --  1.7  --   --  1.5*  --  2.9*  --   --    < > = values in this interval  not displayed.    Liver Function Tests: Recent Labs  Lab 11/22/19 0048  AST 303*  ALT 65*  ALKPHOS 432*  BILITOT 20.1*  PROT 6.2*  ALBUMIN 2.5*   No results for input(s): LIPASE, AMYLASE in the last 168 hours. Recent Labs  Lab 12-13-19 1334  AMMONIA 106*    CBC: Recent Labs  Lab 12-13-2019 1333 11/22/19 0048  WBC 11.9* 10.0  HGB 9.6* 9.3*  HCT 26.3* 25.9*  MCV 101.5* 103.2*  PLT 158 142*    Cardiac Enzymes: No results for input(s): CKTOTAL, TROPONINI in the last 168 hours.  BNP: Invalid input(s): POCBNP  CBG: Recent Labs  Lab Dec 13, 2019 2351 11/22/19 0326 11/22/19 0707 11/22/19 1107  GLUCAP 147* 142* 136* 137*    Microbiology: Recent Results (from the past 720 hour(s))  SARS Coronavirus 2 by RT PCR (hospital order, performed in [facility] hospital lab) Nasopharyngeal Nasopharyngeal Swab     Status: None   Collection Time: 10/24/19  6:24 AM   Specimen: Nasopharyngeal Swab  Result Value Ref Range Status   SARS Coronavirus 2 NEGATIVE NEGATIVE Final    Comment: (NOTE) SARS-CoV-2 target nucleic acids are NOT DETECTED.  The SARS-CoV-2 RNA is generally detectable in upper and lower respiratory specimens during the acute phase of infection. The lowest concentration of SARS-CoV-2 viral copies this assay can detect is 250 copies / mL. A negative result does not preclude SARS-CoV-2 infection and should not be used as the sole basis for treatment or other patient management decisions.  A negative result may occur with improper specimen collection / handling, submission of specimen other than nasopharyngeal swab, presence of viral mutation(s) within the areas targeted by this assay, and inadequate number of viral copies (<250 copies / mL). A negative result must be combined with clinical observations, patient history, and epidemiological information.  Fact Sheet for Patients:   BoilerBrush.com.cy  Fact Sheet for Healthcare  Providers: https://pope.com/  This test is not yet approved or  cleared by the [place] FDA and has been authorized for detection and/or diagnosis of SARS-CoV-2 by FDA under an Emergency Use Authorization (EUA).  This EUA will remain in effect (meaning this test can be used) for the duration of the COVID-19 declaration under Section 564(b)(1) of the Act, 21 U.S.C. section 360bbb-3(b)(1), unless the authorization is terminated or revoked sooner.  Performed at [facility], [address] [place], [place] [address]   Blood culture (routine x 2)     Status: None (Preliminary result)   Collection Time: 11/10/2019  3:49 PM   Specimen: BLOOD  Result Value Ref Range Status   Specimen Description BLOOD BLOOD RIGHT HAND  Final   Special Requests   Final    BOTTLES DRAWN AEROBIC AND ANAEROBIC Blood Culture adequate volume   Culture   Final    NO GROWTH < 24 HOURS Performed at [facility], [address] [place], [place] [address]    Report Status PENDING  Incomplete  Blood culture (routine x 2)     Status: None (Preliminary result)   Collection Time: 11/18/2019  4:40 PM   Specimen: BLOOD  Result Value Ref Range Status   Specimen Description BLOOD LEFT SHOULDER  Final   Special Requests   Final    BOTTLES DRAWN AEROBIC AND ANAEROBIC Blood Culture adequate volume   Culture   Final    NO GROWTH < 24 HOURS Performed at [facility], [address], [place], [place] [address]    Report Status PENDING  Incomplete  Respiratory Panel by RT PCR (Flu A&B, Covid) - Nasopharyngeal Swab     Status: None   Collection Time: 11/15/2019  8:45 PM   Specimen: Nasopharyngeal Swab  Result Value Ref Range Status   SARS Coronavirus 2 by RT PCR NEGATIVE NEGATIVE Final    Comment: (NOTE) SARS-CoV-2 target nucleic acids are NOT DETECTED.  The SARS-CoV-2 RNA is generally detectable in upper respiratoy specimens during the acute phase of infection. The  lowest concentration of SARS-CoV-2 viral copies this assay can detect is 131 copies/mL. A negative result does not preclude SARS-Cov-2 infection and should not be used as the sole basis for treatment or other patient management decisions. A negative result may occur with  improper specimen collection/handling, submission of specimen other than nasopharyngeal swab, presence of viral mutation(s) within the areas targeted by this assay, and inadequate number of viral copies (<131 copies/mL). A negative result must be combined with clinical observations, patient history, and epidemiological information. The expected result is Negative.  Fact Sheet for Patients:  https://www.moore.com/  Fact Sheet for Healthcare Providers:  https://www.young.biz/  This test is no t yet approved or cleared by the [place] FDA and  has been authorized for detection and/or diagnosis of SARS-CoV-2 by FDA under an Emergency Use Authorization (EUA). This EUA will remain  in effect (meaning this test can be used) for the duration of the COVID-19 declaration under Section 564(b)(1) of the Act, 21 U.S.C. section 360bbb-3(b)(1), unless the authorization is terminated or revoked sooner.     Influenza A by PCR NEGATIVE NEGATIVE Final   Influenza B by PCR NEGATIVE NEGATIVE Final    Comment: (NOTE) The Xpert Xpress SARS-CoV-2/FLU/RSV assay is intended as an aid in  the diagnosis of influenza from Nasopharyngeal swab specimens and  should not be used as a  sole basis for treatment. Nasal washings and  aspirates are unacceptable for Xpert Xpress SARS-CoV-2/FLU/RSV  testing.  Fact Sheet for Patients: https://www.moore.com/  Fact Sheet for Healthcare Providers: https://www.young.biz/  This test is not yet approved or cleared by the [place] FDA and  has been authorized for detection and/or diagnosis of SARS-CoV-2 by  FDA under  an Emergency Use Authorization (EUA). This EUA will remain  in effect (meaning this test can be used) for the duration of the  Covid-19 declaration under Section 564(b)(1) of the Act, 21  U.S.C. section 360bbb-3(b)(1), unless the authorization is  terminated or revoked. Performed at [facility], [address] [place], [place] [address]   MRSA PCR Screening     Status: None   Collection Time: 11/22/19 12:40 AM   Specimen: Nasal Mucosa; Nasopharyngeal  Result Value Ref Range Status   MRSA by PCR NEGATIVE NEGATIVE Final    Comment:        The GeneXpert MRSA Assay (FDA approved for NASAL specimens only), is one component of a comprehensive MRSA colonization surveillance program. It is not intended to diagnose MRSA infection nor to guide or monitor treatment for MRSA infections. Performed at [facility], [address] [place], [place] [address]      Coagulation Studies: Recent Labs    16-Dec-2019 1456  LABPROT 16.1*  INR 1.3*    Urinalysis: Recent Labs    16-Dec-2019 2311  COLORURINE AMBER*  LABSPEC 1.025  PHURINE 5.0  GLUCOSEU NEGATIVE  HGBUR SMALL*  BILIRUBINUR MODERATE*  KETONESUR 20*  PROTEINUR 30*  NITRITE POSITIVE*  LEUKOCYTESUR NEGATIVE        Imaging: CT ABDOMEN PELVIS WO CONTRAST  Result Date: December 16, 2019 CLINICAL DATA:  Left upper quadrant pain. EXAM: CT ABDOMEN AND PELVIS WITHOUT CONTRAST TECHNIQUE: Multidetector CT imaging of the abdomen and pelvis was performed following the standard protocol without IV contrast. COMPARISON:  October 24, 2019 FINDINGS: Lower chest: No acute abnormality. Hepatobiliary: The liver is enlarged with diffuse fatty infiltration of the liver parenchyma. No focal liver abnormality is seen. Status post cholecystectomy. No biliary dilatation. Pancreas: Unremarkable. No pancreatic ductal dilatation or surrounding inflammatory changes. Spleen: Normal in size without focal abnormality. Adrenals/Urinary Tract:  Adrenal glands are unremarkable. Kidneys are normal, without renal calculi, focal lesion, or hydronephrosis. Bladder is unremarkable. Stomach/Bowel: Stomach is within normal limits. Appendix appears normal. No evidence of bowel wall thickening, distention, or inflammatory changes. Noninflamed diverticula are seen within the descending and sigmoid colon. Vascular/Lymphatic: There is moderate severity calcification of the abdominal aorta and bilateral common iliac arteries, without evidence of aneurysmal dilatation. No enlarged abdominal or pelvic lymph nodes. Reproductive: Uterus and bilateral adnexa are unremarkable. Other: No abdominal wall hernia or abnormality. No abdominopelvic ascites. Musculoskeletal: No acute or significant osseous findings. IMPRESSION: 1. Enlarged fatty liver. 2. Colonic diverticulosis. 3. Aortic atherosclerosis. Aortic Atherosclerosis (ICD10-I70.0). Electronically Signed   By: Aram Candela M.D.   On: December 16, 2019 21:50   CT Head Wo Contrast  Result Date: 16-Dec-2019 CLINICAL DATA:  Seizure. EXAM: CT HEAD WITHOUT CONTRAST TECHNIQUE: Contiguous axial images were obtained from the base of the skull through the vertex without intravenous contrast. COMPARISON:  October 24, 2019 FINDINGS: Brain: No evidence of acute infarction, hemorrhage, hydrocephalus, extra-axial collection or mass lesion/mass effect. Vascular: No hyperdense vessel or unexpected calcification. Skull: Normal. Negative for fracture or focal lesion. Sinuses/Orbits: No acute finding. Other: None. IMPRESSION: No acute intracranial pathology. Electronically Signed   By: Demetrius Revel.D.  On: 11/19/2019 21:42   DG Chest Portable 1 View  Result Date: 11/06/2019 CLINICAL DATA:  Weakness.  Liver failure, confusion. EXAM: PORTABLE CHEST 1 VIEW COMPARISON:  10/24/2019 FINDINGS: Elevated right hemidiaphragm with right lower lobe atelectasis. Left lung is clear. Heart size and vascularity normal. IMPRESSION: Right lower  lobe atelectasis.  No acute abnormality. Electronically Signed   By: Marlan Palau M.D.   On: 12/01/2019 17:39   US Abdomen Limited RUQ (LIVER/GB)  Result Date: 12/01/2019 CLINICAL DATA:  Bilirubinemia, alcoholism. EXAM: ULTRASOUND ABDOMEN LIMITED RIGHT UPPER QUADRANT COMPARISON:  10/24/2019 CT chest, abdomen and pelvis. 10/12/2019 abdominal ultrasound and prior. FINDINGS: Gallbladder: Surgically absent. Common bile duct: Diameter: 13 mm, compatible with post cholecystectomy sequela. Liver: No focal lesion identified. Heterogenous, echogenic parenchyma. Please note that ultrasound is limited in its sensitivity to evaluate a heterogenous liver for focal lesions. Portal vein is patent on color Doppler imaging with normal direction of blood flow towards the liver. Other: None. IMPRESSION: Heterogenous, echogenic hepatic parenchyma. No focal hepatic lesion. Post cholecystectomy sequela. Electronically Signed   By: Stana Bunting M.D.   On: 11/05/2019 15:45      Assessment & Plan: Pt is a 39 y.o.  Caucasian  female with alcohol abuse, alcohol hepatitis, bipolar disorder, PTSD, seizure disorder, was admitted on 11/19/2019 with Hepatic encephalopathy (HCC) [K72.90] Acute hyponatremia [E87.1] Bilirubinemia [E80.6] Hyponatremia with decreased serum osmolality [E87.1] Alcoholic cirrhosis of liver with ascites (HCC) [K70.31]   #Severe hyponatremia and hypokalemia Review of electrolytes, patient's clinical condition, dry mouth, Urine Na < 10, suggests that hypokalemia and hyponatremia could be secondary to severe volume depletion and dehydration.  Review of home medications was conducted-patient is not on diuretic but takes docusate and lactulose.  This along with alcohol abuse could have contributed to severe dehydration. She also has severe jaundice and hyperbilirubinemia with bilirubin level of 20 CT of the abdomen and pelvis without contrast on October 18 shows enlarged fatty liver, colon  diverticulosis and aortic atherosclerosis  Plan: Hydrate with IV normal saline with potassium supplementation Goal for sodium correction by tomorrow morning is 124 Follow sodium levels closely  #Severe alcohol abuse #Alcohol hepatitis #Jaundice - withdrawal precautions - Management as per ICU team   LOS: 1 Sasha Rogel 10/19/20211:18 PM    Note: This note was prepared with Dragon dictation. Any transcription errors are unintentional

## 2019-11-23 DIAGNOSIS — E871 Hypo-osmolality and hyponatremia: Secondary | ICD-10-CM | POA: Diagnosis not present

## 2019-11-23 DIAGNOSIS — Z515 Encounter for palliative care: Secondary | ICD-10-CM

## 2019-11-23 DIAGNOSIS — Z7189 Other specified counseling: Secondary | ICD-10-CM | POA: Diagnosis not present

## 2019-11-23 DIAGNOSIS — K7290 Hepatic failure, unspecified without coma: Secondary | ICD-10-CM | POA: Diagnosis not present

## 2019-11-23 DIAGNOSIS — K7031 Alcoholic cirrhosis of liver with ascites: Secondary | ICD-10-CM | POA: Diagnosis not present

## 2019-11-23 LAB — GLUCOSE, CAPILLARY
Glucose-Capillary: 74 mg/dL (ref 70–99)
Glucose-Capillary: 81 mg/dL (ref 70–99)
Glucose-Capillary: 90 mg/dL (ref 70–99)
Glucose-Capillary: 91 mg/dL (ref 70–99)
Glucose-Capillary: 94 mg/dL (ref 70–99)
Glucose-Capillary: 97 mg/dL (ref 70–99)

## 2019-11-23 LAB — PROCALCITONIN: Procalcitonin: 1.47 ng/mL

## 2019-11-23 LAB — CBC WITH DIFFERENTIAL/PLATELET
Abs Immature Granulocytes: 0.04 10*3/uL (ref 0.00–0.07)
Basophils Absolute: 0 10*3/uL (ref 0.0–0.1)
Basophils Relative: 0 %
Eosinophils Absolute: 0.1 10*3/uL (ref 0.0–0.5)
Eosinophils Relative: 2 %
HCT: 23.2 % — ABNORMAL LOW (ref 36.0–46.0)
Hemoglobin: 8.4 g/dL — ABNORMAL LOW (ref 12.0–15.0)
Immature Granulocytes: 1 %
Lymphocytes Relative: 12 %
Lymphs Abs: 1 10*3/uL (ref 0.7–4.0)
MCH: 37.7 pg — ABNORMAL HIGH (ref 26.0–34.0)
MCHC: 36.2 g/dL — ABNORMAL HIGH (ref 30.0–36.0)
MCV: 104 fL — ABNORMAL HIGH (ref 80.0–100.0)
Monocytes Absolute: 0.8 10*3/uL (ref 0.1–1.0)
Monocytes Relative: 9 %
Neutro Abs: 6.7 10*3/uL (ref 1.7–7.7)
Neutrophils Relative %: 76 %
Platelets: 146 10*3/uL — ABNORMAL LOW (ref 150–400)
RBC: 2.23 MIL/uL — ABNORMAL LOW (ref 3.87–5.11)
RDW: 16.8 % — ABNORMAL HIGH (ref 11.5–15.5)
WBC: 8.7 10*3/uL (ref 4.0–10.5)
nRBC: 0 % (ref 0.0–0.2)

## 2019-11-23 LAB — MAGNESIUM: Magnesium: 2.4 mg/dL (ref 1.7–2.4)

## 2019-11-23 LAB — COMPREHENSIVE METABOLIC PANEL
ALT: 62 U/L — ABNORMAL HIGH (ref 0–44)
AST: 317 U/L — ABNORMAL HIGH (ref 15–41)
Albumin: 2.3 g/dL — ABNORMAL LOW (ref 3.5–5.0)
Alkaline Phosphatase: 357 U/L — ABNORMAL HIGH (ref 38–126)
Anion gap: 12 (ref 5–15)
BUN: <5 mg/dL — ABNORMAL LOW (ref 6–20)
CO2: 35 mmol/L — ABNORMAL HIGH (ref 22–32)
Calcium: 7.1 mg/dL — ABNORMAL LOW (ref 8.9–10.3)
Chloride: 74 mmol/L — ABNORMAL LOW (ref 98–111)
Creatinine, Ser: <0.3 mg/dL — ABNORMAL LOW (ref 0.44–1.00)
Glucose, Bld: 91 mg/dL (ref 70–99)
Potassium: 2.8 mmol/L — ABNORMAL LOW (ref 3.5–5.1)
Sodium: 121 mmol/L — ABNORMAL LOW (ref 135–145)
Total Bilirubin: 21.5 mg/dL (ref 0.3–1.2)
Total Protein: 5.6 g/dL — ABNORMAL LOW (ref 6.5–8.1)

## 2019-11-23 LAB — PHOSPHORUS: Phosphorus: UNDETERMINED mg/dL (ref 2.5–4.6)

## 2019-11-23 LAB — SODIUM
Sodium: 123 mmol/L — ABNORMAL LOW (ref 135–145)
Sodium: 124 mmol/L — ABNORMAL LOW (ref 135–145)
Sodium: 126 mmol/L — ABNORMAL LOW (ref 135–145)

## 2019-11-23 LAB — POTASSIUM: Potassium: 3.2 mmol/L — ABNORMAL LOW (ref 3.5–5.1)

## 2019-11-23 LAB — URINE CULTURE

## 2019-11-23 MED ORDER — KCL IN DEXTROSE-NACL 20-5-0.9 MEQ/L-%-% IV SOLN
INTRAVENOUS | Status: DC
  Filled 2019-11-23 (×6): qty 1000

## 2019-11-23 MED ORDER — POTASSIUM CHLORIDE 10 MEQ/100ML IV SOLN
10.0000 meq | INTRAVENOUS | Status: AC
  Administered 2019-11-23 (×6): 10 meq via INTRAVENOUS
  Filled 2019-11-23 (×6): qty 100

## 2019-11-23 MED ORDER — POTASSIUM CHLORIDE 10 MEQ/100ML IV SOLN
10.0000 meq | INTRAVENOUS | Status: AC
  Administered 2019-11-23 (×3): 10 meq via INTRAVENOUS
  Filled 2019-11-23 (×3): qty 100

## 2019-11-23 MED ORDER — ORAL CARE MOUTH RINSE
15.0000 mL | Freq: Two times a day (BID) | OROMUCOSAL | Status: DC
  Administered 2019-11-23 – 2019-12-05 (×22): 15 mL via OROMUCOSAL

## 2019-11-23 MED ORDER — SPIRONOLACTONE 25 MG PO TABS
25.0000 mg | ORAL_TABLET | Freq: Every day | ORAL | Status: DC
  Administered 2019-11-23 – 2019-11-30 (×7): 25 mg via ORAL
  Filled 2019-11-23 (×8): qty 1

## 2019-11-23 NOTE — Consult Note (Signed)
Consultation Note Date: 11/23/2019   Patient Name: Ashlee Hawkins  DOB: 1980/10/14  MRN: [id number]  Age / Sex: 39 y.o., female  PCP: [facility] Referring Physician: Erin Fulling, MD  Reason for Consultation: Establishing goals of care  HPI/Patient Profile: 39 y.o. female  with past medical history of ETOH abuse, seizures, and alcoholic hepatitis admitted on 2019/12/04 with LUQ pain and seizure at home. CT of abdomen revealed enlarged fatty liver. Found to have severe hyponatremia. She is at high risk for aspiration and intubation. PMT consulted to discuss GOC.   Clinical Assessment and Goals of Care: I have reviewed medical records including EPIC notes, labs and imaging, received report from RN, assessed the patient and then spoke with her spouse Jonny Ruiz to discuss diagnosis prognosis, GOC, EOL wishes, disposition and options.  Patient unable to participate in GOC conversation d/t AMS.   I introduced Palliative Medicine as specialized medical care for people living with serious illness. It focuses on providing relief from the symptoms and stress of a serious illness. The goal is to improve quality of life for both the patient and the family.  We discussed a brief life review of the patient. John tells me patient has 2 children - one is an adult and the other is 40 years old.  When I ask about Ms. Hanko's baseline status Jonny Ruiz becomes tearful and tells me she has not been doing well - tells me about significant alcohol use "excessive" and tells me she takes 26 different medications. She was able to care for herself and help care for son. He tells me about her abrupt cessation of medications 2 weeks ago. He is unsure why.    We discussed patient's current illness and what it means in the larger context of patient's on-going co-morbidities.  Natural disease trajectory and expectations at EOL were discussed. We discussed severity of  patient's illness and concern about both acute recovery and long-term outcomes. John expresses understanding.   I attempted to elicit values and goals of care important to the patient. John indicates Aveya would be interested in all aggressive medical interventions at this point as she would "want to live for our son".     The difference between aggressive medical intervention and comfort care was considered in light of the patient's goals of care. Advance directives, concepts specific to code status, artificial feeding and hydration, and rehospitalization were considered and discussed. Discussed with John consideration of DNR/DNI status understanding evidenced based poor outcomes in similar hospitalized patients. He shares he will consider but for now would like to maintain full code status.   Discussed with Jonny Ruiz the importance of continued conversation with family and the medical providers regarding overall plan of care and treatment options, ensuring decisions are within the context of the patient's values and GOCs.    Questions and concerns were addressed. The family was encouraged to call with questions or concerns.   Primary Decision Maker NEXT OF KIN - spouse John   SUMMARY OF RECOMMENDATIONS   - full code/full scope - ongoing GOC conversations - will continue regular follow ups with spouse  Code Status/Advance Care Planning:  Full code  Prognosis:   Unable to determine  Discharge Planning: To Be Determined      Primary Diagnoses: Present on Admission: . Hyponatremia with decreased serum osmolality   I have reviewed the medical record, interviewed the patient and family, and examined the patient. The following aspects are pertinent.  Past Medical History:  Diagnosis  Date  . Alcoholism (HCC)   . Bipolar 1 disorder (HCC)   . Hypertension   . Liver disease   . PTSD (post-traumatic stress disorder)   . Seizures (HCC)    Social History   Socioeconomic History  .  Marital status: Married    Spouse name: Not on file  . Number of children: Not on file  . Years of education: Not on file  . Highest education level: Not on file  Occupational History  . Not on file  Tobacco Use  . Smoking status: Current Every Day Smoker    Packs/day: 1.00    Types: Cigarettes  . Smokeless tobacco: Never Used  Vaping Use  . Vaping Use: Never used  Substance and Sexual Activity  . Alcohol use: Yes    Alcohol/week: 36.0 standard drinks    Types: 36 Shots of liquor per week  . Drug use: Not Currently    Types: Cocaine, Marijuana  . Sexual activity: Not on file  Other Topics Concern  . Not on file  Social History Narrative  . Not on file   Social Determinants of Health   Financial Resource Strain:   . Difficulty of Paying Living Expenses: Not on file  Food Insecurity:   . Worried About Programme researcher, broadcasting/film/video in the Last Year: Not on file  . Ran Out of Food in the Last Year: Not on file  Transportation Needs:   . Lack of Transportation (Medical): Not on file  . Lack of Transportation (Non-Medical): Not on file  Physical Activity:   . Days of Exercise per Week: Not on file  . Minutes of Exercise per Session: Not on file  Stress:   . Feeling of Stress : Not on file  Social Connections:   . Frequency of Communication with Friends and Family: Not on file  . Frequency of Social Gatherings with Friends and Family: Not on file  . Attends Religious Services: Not on file  . Active Member of Clubs or Organizations: Not on file  . Attends Banker Meetings: Not on file  . Marital Status: Not on file   Family History  Problem Relation Age of Onset  . Heart failure Mother    Scheduled Meds: . Chlorhexidine Gluconate Cloth  6 each Topical Daily  . folic acid  1 mg Oral Daily  . heparin  5,000 Units Subcutaneous Q8H  . lactulose  20 g Oral BID  . LORazepam  0-4 mg Intravenous Q6H   Or  . LORazepam  0-4 mg Oral Q6H  . LORazepam  0-4 mg Intravenous  Q12H   Or  . LORazepam  0-4 mg Oral Q12H  . oxcarbazepine  600 mg Oral BID  . pantoprazole (PROTONIX) IV  40 mg Intravenous Q24H  . rifaximin  550 mg Oral BID  . sodium chloride flush  10-40 mL Intracatheter Q12H  . spironolactone  25 mg Oral Daily  . thiamine  100 mg Oral Daily   Or  . thiamine  100 mg Intravenous Daily   Continuous Infusions: . 0.9 % NaCl with KCl 20 mEq / L 75 mL/hr at 11/23/19 0600  . piperacillin-tazobactam (ZOSYN)  IV 3.375 g (11/23/19 0830)  . potassium chloride 10 mEq (11/23/19 1112)   PRN Meds:.docusate sodium, ondansetron (ZOFRAN) IV, polyethylene glycol, sodium chloride flush No Known Allergies Review of Systems  Unable to perform ROS: Mental status change    Physical Exam Constitutional:      General: She is not  in acute distress.    Appearance: She is ill-appearing.     Comments: lethargic  Pulmonary:     Effort: Pulmonary effort is normal. No respiratory distress.  Skin:    General: Skin is warm and dry.  Psychiatric:     Comments: restless     Vital Signs: BP 105/69 (BP Location: Right Wrist)   Pulse 96   Temp 98.3 F (36.8 C) (Oral)   Resp (!) 24   Ht 5\' 3"  (1.6 m)   Wt 89.7 kg   LMP  (LMP Unknown)   SpO2 99%   BMI 35.03 kg/m  Pain Scale: 0-10   Pain Score: 0-No pain   SpO2: SpO2: 99 % O2 Device:SpO2: 99 % O2 Flow Rate: .O2 Flow Rate (L/min): 1 L/min  IO: Intake/output summary:   Intake/Output Summary (Last 24 hours) at 11/23/2019 1153 Last data filed at 11/23/2019 0600 Gross per 24 hour  Intake 2531.49 ml  Output 704 ml  Net 1827.49 ml    LBM: Last BM Date: 11/23/19 Baseline Weight: Weight: 82.1 kg Most recent weight: Weight: 89.7 kg     Palliative Assessment/Data: PPS 20%    Time Total: 60 minutes Greater than 50%  of this time was spent counseling and coordinating care related to the above assessment and plan.  11/25/19, DNP, AGNP-C Palliative Medicine Team [phone] Pager: [phone]

## 2019-11-23 NOTE — Progress Notes (Signed)
11/22/19@2000 Annabelle Harman NP aware of critical NA of 117 and critical potassium of 2.4, pharmacy also aware as they are managing electrolyte replacement.@2340 NP aware of critical Na of 118.

## 2019-11-23 NOTE — Progress Notes (Signed)
60- Pharmacy aware of potassium of 2.8 and replacement needed.

## 2019-11-23 NOTE — Consult Note (Signed)
PHARMACY CONSULT NOTE - FOLLOW UP  Pharmacy Consult for Electrolyte Monitoring and Replacement   Recent Labs: Potassium (mmol/L)  Date Value  11/23/2019 3.2 (L)   Magnesium (mg/dL)  Date Value  62/13/0865 2.4   Calcium (mg/dL)  Date Value  78/46/9629 7.1 (L)   Albumin (g/dL)  Date Value  52/84/1324 2.3 (L)   Phosphorus (mg/dL)  Date Value  40/11/2723 UNABLE TO REPORT DUE TO ICTERUS   Sodium (mmol/L)  Date Value  11/23/2019 124 (L)     Assessment: 39yo female with PMH bipolar disorder, HTN, alcohol abuse with alcoholic hepatitis who came to ED c/o seizure occurring ~0900 10/18. Patient reports drinking her usual amount including 5 shots this morning. Patient speech noted to be slow and deliberate. Skin and eyes noted to have yellow tone. Pharmacy has been consulted for monitoring electrolytes.   Hypertonic saline on hold. Nephro is following.  MIVF NS w/ 20 mEq K at 75 ml/hr K remains low despite aggressive replacement  Goal of Therapy:  WNL  Plan:  K 3.2 s/p 6 runs of potassium 10 mEq IV; patient is also still has MIVF with K @75ml /hr.  Will give additional KCl IV x 3  Monitor electrolytes with morning labs.  , PharmD Pharmacy Resident  11/23/2019 6:26 PM

## 2019-11-23 NOTE — Progress Notes (Signed)
GOALS OF CARE DISCUSSION  The Clinical status was relayed to family in [place], over the phone.  Updated and notified of patients medical condition.   patient with increased WOB and using accessory muscles to breathe Explained to family course of therapy and the modalities Progressive Liver failure     Patient with Progressive liver failure with very low chance of meaningful recovery despite all aggressive and optimal medical therapy.   Husband understands the situation.I dicussed CODE status   Family are satisfied with Plan of action and management. All questions answered  Additional CC time 32 mins   Helmuth Recupero Santiago Glad, M.D.  [facility]  Medical Director [facility] Medical Director [facility] Cardio-Pulmonary Department

## 2019-11-23 NOTE — Consult Note (Signed)
PHARMACY CONSULT NOTE - FOLLOW UP  Pharmacy Consult for Electrolyte Monitoring and Replacement   Recent Labs: Potassium (mmol/L)  Date Value  11/23/2019 2.8 (L)   Magnesium (mg/dL)  Date Value  96/78/9381 2.4   Calcium (mg/dL)  Date Value  01/75/1025 7.1 (L)   Albumin (g/dL)  Date Value  85/27/7824 2.3 (L)   Phosphorus (mg/dL)  Date Value  23/53/6144 UNABLE TO REPORT DUE TO ICTERUS   Sodium (mmol/L)  Date Value  11/23/2019 121 (L)     Assessment: 39yo female with PMH bipolar disorder, HTN, alcohol abuse with alcoholic hepatitis who came to ED c/o seizure occurring ~0900 10/18. Patient reports drinking her usual amount including 5 shots this morning. Patient speech noted to be slow and deliberate. Skin and eyes noted to have yellow tone. Pharmacy has been consulted for monitoring electrolytes.   MIVF NS w/ 20 mEq K at 75 ml/hr  Goal of Therapy:  WNL  Plan:  Patient on MIVF with potassium as well as receiving 6 runs of potassium 10 mEq IV. Will plan to follow up potassium this evening as it has been consistently low despite replacement. All electrolytes with morning labs.  Pricilla Riffle ,PharmD Clinical Pharmacist 11/23/2019 10:47 AM

## 2019-11-23 NOTE — Progress Notes (Signed)
CRITICAL CARE NOTE 39 yo female with a significant history of alcohol hepatitis and seizures presenting to the ED with LUQ pain and reports of a seizure at home admitted to the ICU for monitoring.  Significant Hospital Events   Dec 18, 2019 Started on 3% NaCl in ED, admit to ICU  Consults:  Neurology Nephrology  Procedures:    Significant Diagnostic Tests:  12-18-2019 CT head > negative 12/18/2019 CT abdomen > enlarged fatty liver, colonic diverticulosis, aortic atherosclerosis 10/19 severe DT's Micro Data:  10/18 BC >> 10/18 COVID 19 >> negative 10/18 Influenza A/B >> negative 10/18 UA >>   CC  encephalopathy  SUBJECTIVE Patient remains critically ill Prognosis is guarded High risk for aspiration/intubation   BP 107/70   Pulse 96   Temp 97.9 F (36.6 C) (Oral)   Resp (!) 24   Ht 5\' 3"  (1.6 m)   Wt 89.7 kg   LMP  (LMP Unknown)   SpO2 99%   BMI 35.03 kg/m    I/O last 3 completed shifts: In: 3121.9 [I.V.:1605.1; IV Piggyback:1516.8] Out: 999 [Urine:497; Stool:502] No intake/output data recorded.  SpO2: 99 % O2 Flow Rate (L/min): 2 L/min  Estimated body mass index is 35.03 kg/m as calculated from the following:   Height as of this encounter: 5\' 3"  (1.6 m).   Weight as of this encounter: 89.7 kg.  SIGNIFICANT EVENTS   REVIEW OF SYSTEMS  PATIENT IS UNABLE TO PROVIDE COMPLETE REVIEW OF SYSTEMS DUE TO SEVERE CRITICAL ILLNESS        PHYSICAL EXAMINATION:  GENERAL:critically ill appearing,  HEAD: Normocephalic, atraumatic.  EYES:+ scleral icterus.  MOUTH: Moist mucosal membrane. NECK: Supple.  PULMONARY: +rhonchi, CARDIOVASCULAR: S1 and S2. Regular rate and rhythm. No murmurs, rubs, or gallops.  GASTROINTESTINAL: Soft, nontender, -distended.  Positive bowel sounds.   MUSCULOSKELETAL: No swelling, clubbing, or edema.  NEUROLOGIC:lethargic SKIN:intact,warm,dry  MEDICATIONS: I have reviewed all medications and confirmed regimen as  documented   CULTURE RESULTS   Recent Results (from the past 240 hour(s))  Blood culture (routine x 2)     Status: None (Preliminary result)   Collection Time: 12/18/19  3:49 PM   Specimen: BLOOD  Result Value Ref Range Status   Specimen Description BLOOD BLOOD RIGHT HAND  Final   Special Requests   Final    BOTTLES DRAWN AEROBIC AND ANAEROBIC Blood Culture adequate volume   Culture   Final    NO GROWTH < 24 HOURS Performed at [facility], [address], [place], [address] [place]    Report Status PENDING  Incomplete  Blood culture (routine x 2)     Status: None (Preliminary result)   Collection Time: 12/18/19  4:40 PM   Specimen: BLOOD  Result Value Ref Range Status   Specimen Description BLOOD LEFT SHOULDER  Final   Special Requests   Final    BOTTLES DRAWN AEROBIC AND ANAEROBIC Blood Culture adequate volume   Culture   Final    NO GROWTH < 24 HOURS Performed at [facility], [address] [place], [address] [place]    Report Status PENDING  Incomplete  Respiratory Panel by RT PCR (Flu A&B, Covid) - Nasopharyngeal Swab     Status: None   Collection Time: 12-18-2019  8:45 PM   Specimen: Nasopharyngeal Swab  Result Value Ref Range Status   SARS Coronavirus 2 by RT PCR NEGATIVE NEGATIVE Final    Comment: (NOTE) SARS-CoV-2 target nucleic acids are NOT DETECTED.  The SARS-CoV-2 RNA is  generally detectable in upper respiratoy specimens during the acute phase of infection. The lowest concentration of SARS-CoV-2 viral copies this assay can detect is 131 copies/mL. A negative result does not preclude SARS-Cov-2 infection and should not be used as the sole basis for treatment or other patient management decisions. A negative result may occur with  improper specimen collection/handling, submission of specimen other than nasopharyngeal swab, presence of viral mutation(s) within the areas targeted by this assay, and inadequate number of viral copies (<131  copies/mL). A negative result must be combined with clinical observations, patient history, and epidemiological information. The expected result is Negative.  Fact Sheet for Patients:  https://www.moore.com/  Fact Sheet for Healthcare Providers:  https://www.young.biz/  This test is no t yet approved or cleared by the [place] FDA and  has been authorized for detection and/or diagnosis of SARS-CoV-2 by FDA under an Emergency Use Authorization (EUA). This EUA will remain  in effect (meaning this test can be used) for the duration of the COVID-19 declaration under Section 564(b)(1) of the Act, 21 U.S.C. section 360bbb-3(b)(1), unless the authorization is terminated or revoked sooner.     Influenza A by PCR NEGATIVE NEGATIVE Final   Influenza B by PCR NEGATIVE NEGATIVE Final    Comment: (NOTE) The Xpert Xpress SARS-CoV-2/FLU/RSV assay is intended as an aid in  the diagnosis of influenza from Nasopharyngeal swab specimens and  should not be used as a sole basis for treatment. Nasal washings and  aspirates are unacceptable for Xpert Xpress SARS-CoV-2/FLU/RSV  testing.  Fact Sheet for Patients: https://www.moore.com/  Fact Sheet for Healthcare Providers: https://www.young.biz/  This test is not yet approved or cleared by the [place] FDA and  has been authorized for detection and/or diagnosis of SARS-CoV-2 by  FDA under an Emergency Use Authorization (EUA). This EUA will remain  in effect (meaning this test can be used) for the duration of the  Covid-19 declaration under Section 564(b)(1) of the Act, 21  U.S.C. section 360bbb-3(b)(1), unless the authorization is  terminated or revoked. Performed at [facility], [address] [place], [place] [address]   MRSA PCR Screening     Status: None   Collection Time: 11/22/19 12:40 AM   Specimen: Nasal Mucosa; Nasopharyngeal  Result Value  Ref Range Status   MRSA by PCR NEGATIVE NEGATIVE Final    Comment:        The GeneXpert MRSA Assay (FDA approved for NASAL specimens only), is one component of a comprehensive MRSA colonization surveillance program. It is not intended to diagnose MRSA infection nor to guide or monitor treatment for MRSA infections. Performed at [facility], [address] [place] [place], [place] [address]           IMAGING    EEG  Result Date: 11/22/2019 Charlsie Quest, MD     11/22/2019  5:15 PM Patient Name: Ashlee Hawkins MRN: [id number] Epilepsy Attending: Charlsie Quest Referring Physician/Provider: Harlon Ditty, NP Date: 11/22/2019 Duration: 26.14 mins Patient history: 39 year old female with breakthrough seizure in the setting of heavy EtOH abuse and severe electrolyte disturbance, as well as noncompliance with her anticonvulsant. EEG to evaluate for seizure Level of alertness: Awake AEDs during EEG study: OXC, Ativan Technical aspects: This EEG study was done with scalp electrodes positioned according to the 10-20 International system of electrode placement. Electrical activity was acquired at a sampling rate of 500Hz  and reviewed with a high frequency filter of 70Hz  and a low frequency filter  of 1Hz . EEG data were recorded continuously and digitally stored. Description: The posterior dominant rhythm consists of 9 Hz activity of moderate voltage (25-35 uV) seen predominantly in posterior head regions, symmetric and reactive to eye opening and eye closing. EEG showed continuous generalized 3 to 6 Hz theta-delta slowing. Hyperventilation and photic stimulation were not performed.   ABNORMALITY -Continuous  slow, generalized IMPRESSION: This study is suggestive of mild diffuse encephalopathy, nonspecific etiology. No seizures or epileptiform discharges were seen throughout the recording.    CMP Latest Ref Rng & Units 11/23/2019 11/22/2019 11/22/2019  Glucose 70 - 99  mg/dL 91 - -  BUN 6 - 20 mg/dL 11/24/2019) - -  Creatinine <4(E - 1.00 mg/dL 3.15) - -  Sodium <4.00(Q - 145 mmol/L 121(L) 118(LL) 117(LL)  Potassium 3.5 - 5.1 mmol/L 2.8(L) - 2.4(LL)  Chloride 98 - 111 mmol/L 74(L) - -  CO2 22 - 32 mmol/L 35(H) - -  Calcium 8.9 - 10.3 mg/dL 7.1(L) - -  Total Protein 6.5 - 8.1 g/dL 676) - -  Total Bilirubin 0.3 - 1.2 mg/dL 21.5(HH) - -  Alkaline Phos 38 - 126 U/L 357(H) - -  AST 15 - 41 U/L 317(H) - -  ALT 0 - 44 U/L 62(H) - -       ASSESSMENT AND PLAN SYNOPSIS  Severe Hyponatremia with reported seizure PMHx: seizure disorder, ETOH abuse with DT's  SEVERE ALCOHOL WITHDRAWAL -Therapy with Thiamine and MVI -CIWA Protocol -Precedex as needed -High risk for intubation  -high risk for aspiration  SEVERE HYPONATREMIA Follow up cardiology recs  ETOH hepatitis and liver failure TB 21 Prognosis is very poor   Morbid obesity, possible OSA.   Will certainly impact respiratory mechanics    NEUROLOGY Encephalopathy and DT's High risk for intubation Acute toxic metabolic encephalopathy   CARDIAC ICU monitoring  ID -continue IV abx as prescibed -follow up cultures  GI GI PROPHYLAXIS as indicated   DIET-->as tolerated Constipation protocol as indicated  ENDO - will use ICU hypoglycemic\Hyperglycemia protocol if indicated     ELECTROLYTES -follow labs as needed -replace as needed -pharmacy consultation and following   DVT/GI PRX ordered and assessed TRANSFUSIONS AS NEEDED MONITOR FSBS I Assessed the need for Labs I Assessed the need for Foley I Assessed the need for Central Venous Line Family Discussion when available I Assessed the need for Mobilization I made an Assessment of medications to be adjusted accordingly Safety Risk assessment completed   CASE DISCUSSED IN MULTIDISCIPLINARY ROUNDS WITH ICU TEAM  Critical Care Time devoted to patient care services described in this note is 75 minutes.   Overall, patient  is critically ill, prognosis is guarded.  Patient with Multiorgan failure and at high risk for cardiac arrest and death.    1.9(J, M.D.  [facility]  Medical Director [facility] Medical Director [facility] Cardio-Pulmonary Department

## 2019-11-23 NOTE — Progress Notes (Signed)
[facility]  ROUNDING NOTE   Subjective:  Ashlee Hawkins is a 38 y.o. female with medical problems of alcohol abuse, who was admitted to [facility] on 11/30/2019 for evaluation of seizures.  Patient more alert, and awake today.No acute distress noted.   Objective:  Vital signs in last 24 hours:  Temp:  [97.6 F (36.4 C)-98.7 F (37.1 C)] 98.7 F (37.1 C) (10/20 1200) Pulse Rate:  [90-105] 96 (10/20 0700) Resp:  [14-24] 24 (10/20 0700) BP: (100-134)/(67-97) 105/69 (10/20 0800) SpO2:  [92 %-99 %] 99 % (10/20 0700) Weight:  [89.7 kg] 89.7 kg (10/20 0347)  Weight change: 7.6 kg Filed Weights   12/03/2019 2348 11/22/19 0441 11/23/19 0347  Weight: 90.1 kg 90.1 kg 89.7 kg    Intake/Output: I/O last 3 completed shifts: In: 3121.9 [I.V.:1605.1; IV Piggyback:1516.8] Out: 999 [Urine:497; Stool:502]   Intake/Output this shift:  Total I/O In: 820.5 [I.V.:473.4; IV Piggyback:347.2] Out: 3 [Urine:2; Stool:1]  Physical Exam: General: Resting in bed, in no acute distress or agitation  Head: Moist oral mucosal membranes  Eyes: Sclerae and conjunctivae clear  Neck: Supple, trachea at midline  Lungs:  Clear to auscultation bilaterally  Heart: Regular rate and rhythm  Abdomen:  Soft, nontender,   Extremities:  No  peripheral edema.  Neurologic:  slow mentation, moving all four extremities  Skin: jaundiced    Basic Metabolic Panel: Recent Labs  Lab 11/09/2019 1333 11/16/2019 1333 12/03/2019 1456 11/27/2019 1527 11/15/2019 1527 11/25/2019 1814 11/08/2019 2036 11/22/19 0048 11/22/19 0048 11/22/19 0428 11/22/19 1024 11/22/19 1907 11/22/19 2305 11/23/19 0341  NA 108*   < >  --  111*   < >   < > 116* 117*   < > 116* 114* 117* 118* 121*  K <2.0*   < >  --  <2.0*  --    < > 2.0* 2.1*  --  2.2*  --  2.4*  --  2.8*  CL <65*  --   --  <65*  --   --   --  65*  --  66*  --   --   --  74*  CO2 24  --   --  23  --   --   --  28  --  34*  --   --   --  35*  GLUCOSE 147*  --   --  127*  --   --    --  119*  --  123*  --   --   --  91  BUN <5*  --   --  <5*  --   --   --  <5*  --  <5*  --   --   --  <5*  CREATININE <0.30*  --   --  <0.30*  --   --   --  <0.30*  --  0.34*  --   --   --  <0.30*  CALCIUM 7.8*   < >  --  7.8*   < >  --   --  7.6*  --  7.5*  --   --   --  7.1*  MG  --   --  1.7  --   --   --  1.5* 2.9*  --   --   --   --   --  2.4  PHOS  --   --   --   --   --   --   --   --   --   --   --   --   --  UNABLE TO REPORT DUE TO ICTERUS   < > = values in this interval not displayed.    Liver Function Tests: Recent Labs  Lab 12/13/2019 1333 11/22/19 0048 11/23/19 0341  AST 313* 303* 317*  ALT 66* 65* 62*  ALKPHOS 476* 432* 357*  BILITOT 20.1* 20.1* 21.5*  PROT 6.3* 6.2* 5.6*  ALBUMIN 2.7* 2.5* 2.3*   No results for input(s): LIPASE, AMYLASE in the last 168 hours. Recent Labs  Lab 2019/12/13 1334  AMMONIA 106*    CBC: Recent Labs  Lab 12-13-19 1333 11/22/19 0048 11/23/19 0341  WBC 11.9* 10.0 8.7  NEUTROABS  --   --  6.7  HGB 9.6* 9.3* 8.4*  HCT 26.3* 25.9* 23.2*  MCV 101.5* 103.2* 104.0*  PLT 158 142* 146*    Cardiac Enzymes: No results for input(s): CKTOTAL, CKMB, CKMBINDEX, TROPONINI in the last 168 hours.  BNP: Invalid input(s): POCBNP  CBG: Recent Labs  Lab 11/22/19 1939 11/22/19 2334 11/23/19 0343 11/23/19 0731 11/23/19 1126  GLUCAP 109* 106* 97 90 94    Microbiology: Results for orders placed or performed during the hospital encounter of 12-13-19  Blood culture (routine x 2)     Status: None (Preliminary result)   Collection Time: 13-Dec-2019  3:49 PM   Specimen: BLOOD  Result Value Ref Range Status   Specimen Description BLOOD BLOOD RIGHT HAND  Final   Special Requests   Final    BOTTLES DRAWN AEROBIC AND ANAEROBIC Blood Culture adequate volume   Culture   Final    NO GROWTH 2 DAYS Performed at [facility], [address], [place], [place] [address]    Report Status PENDING  Incomplete  Blood culture (routine x 2)      Status: None (Preliminary result)   Collection Time: December 13, 2019  4:40 PM   Specimen: BLOOD  Result Value Ref Range Status   Specimen Description BLOOD LEFT SHOULDER  Final   Special Requests   Final    BOTTLES DRAWN AEROBIC AND ANAEROBIC Blood Culture adequate volume   Culture   Final    NO GROWTH 2 DAYS Performed at [facility], [address], [place], [place] [address]    Report Status PENDING  Incomplete  Respiratory Panel by RT PCR (Flu A&B, Covid) - Nasopharyngeal Swab     Status: None   Collection Time: 12-13-19  8:45 PM   Specimen: Nasopharyngeal Swab  Result Value Ref Range Status   SARS Coronavirus 2 by RT PCR NEGATIVE NEGATIVE Final    Comment: (NOTE) SARS-CoV-2 target nucleic acids are NOT DETECTED.  The SARS-CoV-2 RNA is generally detectable in upper respiratoy specimens during the acute phase of infection. The lowest concentration of SARS-CoV-2 viral copies this assay can detect is 131 copies/mL. A negative result does not preclude SARS-Cov-2 infection and should not be used as the sole basis for treatment or other patient management decisions. A negative result may occur with  improper specimen collection/handling, submission of specimen other than nasopharyngeal swab, presence of viral mutation(s) within the areas targeted by this assay, and inadequate number of viral copies (<131 copies/mL). A negative result must be combined with clinical observations, patient history, and epidemiological information. The expected result is Negative.  Fact Sheet for Patients:  https://www.moore.com/  Fact Sheet for Healthcare Providers:  https://www.young.biz/  This test is no t yet approved or cleared by the [place] FDA and  has been authorized for detection and/or diagnosis of SARS-CoV-2 by FDA under an Emergency Use  Authorization (EUA). This EUA will remain  in effect (meaning this test can be used) for the duration of  the COVID-19 declaration under Section 564(b)(1) of the Act, 21 U.S.C. section 360bbb-3(b)(1), unless the authorization is terminated or revoked sooner.     Influenza A by PCR NEGATIVE NEGATIVE Final   Influenza B by PCR NEGATIVE NEGATIVE Final    Comment: (NOTE) The Xpert Xpress SARS-CoV-2/FLU/RSV assay is intended as an aid in  the diagnosis of influenza from Nasopharyngeal swab specimens and  should not be used as a sole basis for treatment. Nasal washings and  aspirates are unacceptable for Xpert Xpress SARS-CoV-2/FLU/RSV  testing.  Fact Sheet for Patients: https://www.moore.com/https://www.fda.gov/media/142436/download  Fact Sheet for Healthcare Providers: https://www.young.biz/https://www.fda.gov/media/142435/download  This test is not yet approved or cleared by the [place] FDA and  has been authorized for detection and/or diagnosis of SARS-CoV-2 by  FDA under an Emergency Use Authorization (EUA). This EUA will remain  in effect (meaning this test can be used) for the duration of the  Covid-19 declaration under Section 564(b)(1) of the Act, 21  U.S.C. section 360bbb-3(b)(1), unless the authorization is  terminated or revoked. Performed at [facility], [address] [place], [place] [address]   MRSA PCR Screening     Status: None   Collection Time: 11/22/19 12:40 AM   Specimen: Nasal Mucosa; Nasopharyngeal  Result Value Ref Range Status   MRSA by PCR NEGATIVE NEGATIVE Final    Comment:        The GeneXpert MRSA Assay (FDA approved for NASAL specimens only), is one component of a comprehensive MRSA colonization surveillance program. It is not intended to diagnose MRSA infection nor to guide or monitor treatment for MRSA infections. Performed at [facility], [address] [place], [place] [address]   Urine Culture     Status: Abnormal   Collection Time: 11/22/19  1:50 AM   Specimen: Urine, Random  Result Value Ref Range Status   Specimen Description   Final    URINE,  RANDOM Performed at [facility], [address] [place], [place] [address]    Special Requests   Final    NONE Performed at [facility], [address] [place], [place] [address]    Culture MULTIPLE SPECIES PRESENT, SUGGEST RECOLLECTION (A)  Final   Report Status 11/23/2019 FINAL  Final    Coagulation Studies: Recent Labs    Oct 06, 2019 1456  LABPROT 16.1*  INR 1.3*    Urinalysis: Recent Labs    Oct 06, 2019 2311  COLORURINE AMBER*  LABSPEC 1.025  PHURINE 5.0  GLUCOSEU NEGATIVE  HGBUR SMALL*  BILIRUBINUR MODERATE*  KETONESUR 20*  PROTEINUR 30*  NITRITE POSITIVE*  LEUKOCYTESUR NEGATIVE      Imaging: EEG  Result Date: 11/22/2019 Charlsie QuestYadav, Priyanka O, MD     11/22/2019  5:15 PM Patient Name: Ashlee Hawkins MRN: [id number] Epilepsy Attending: Charlsie QuestPriyanka O Yadav Referring Physician/Provider: Harlon DittyJeremiah Keene, NP Date: 11/22/2019 Duration: 26.14 mins Patient history: 39 year old female with breakthrough seizure in the setting of heavy EtOH abuse and severe electrolyte disturbance, as well as noncompliance with her anticonvulsant. EEG to evaluate for seizure Level of alertness: Awake AEDs during EEG study: OXC, Ativan Technical aspects: This EEG study was done with scalp electrodes positioned according to the 10-20 International system of electrode placement. Electrical activity was acquired at a sampling rate of 500Hz  and reviewed with a high frequency filter of 70Hz  and a low frequency filter of 1Hz . EEG data  were recorded continuously and digitally stored. Description: The posterior dominant rhythm consists of 9 Hz activity of moderate voltage (25-35 uV) seen predominantly in posterior head regions, symmetric and reactive to eye opening and eye closing. EEG showed continuous generalized 3 to 6 Hz theta-delta slowing. Hyperventilation and photic stimulation were not performed.   ABNORMALITY -Continuous  slow, generalized IMPRESSION: This study is suggestive of mild  diffuse encephalopathy, nonspecific etiology. No seizures or epileptiform discharges were seen throughout the recording. Priyanka Annabelle Harman   CT ABDOMEN PELVIS WO CONTRAST  Result Date: 12/01/2019 CLINICAL DATA:  Left upper quadrant pain. EXAM: CT ABDOMEN AND PELVIS WITHOUT CONTRAST TECHNIQUE: Multidetector CT imaging of the abdomen and pelvis was performed following the standard protocol without IV contrast. COMPARISON:  October 24, 2019 FINDINGS: Lower chest: No acute abnormality. Hepatobiliary: The liver is enlarged with diffuse fatty infiltration of the liver parenchyma. No focal liver abnormality is seen. Status post cholecystectomy. No biliary dilatation. Pancreas: Unremarkable. No pancreatic ductal dilatation or surrounding inflammatory changes. Spleen: Normal in size without focal abnormality. Adrenals/Urinary Tract: Adrenal glands are unremarkable. Kidneys are normal, without renal calculi, focal lesion, or hydronephrosis. Bladder is unremarkable. Stomach/Bowel: Stomach is within normal limits. Appendix appears normal. No evidence of bowel wall thickening, distention, or inflammatory changes. Noninflamed diverticula are seen within the descending and sigmoid colon. Vascular/Lymphatic: There is moderate severity calcification of the abdominal aorta and bilateral common iliac arteries, without evidence of aneurysmal dilatation. No enlarged abdominal or pelvic lymph nodes. Reproductive: Uterus and bilateral adnexa are unremarkable. Other: No abdominal wall hernia or abnormality. No abdominopelvic ascites. Musculoskeletal: No acute or significant osseous findings. IMPRESSION: 1. Enlarged fatty liver. 2. Colonic diverticulosis. 3. Aortic atherosclerosis. Aortic Atherosclerosis (ICD10-I70.0). Electronically Signed   By: Aram Candela M.D.   On: 11/30/2019 21:50   CT Head Wo Contrast  Result Date: 11/05/2019 CLINICAL DATA:  Seizure. EXAM: CT HEAD WITHOUT CONTRAST TECHNIQUE: Contiguous axial images  were obtained from the base of the skull through the vertex without intravenous contrast. COMPARISON:  October 24, 2019 FINDINGS: Brain: No evidence of acute infarction, hemorrhage, hydrocephalus, extra-axial collection or mass lesion/mass effect. Vascular: No hyperdense vessel or unexpected calcification. Skull: Normal. Negative for fracture or focal lesion. Sinuses/Orbits: No acute finding. Other: None. IMPRESSION: No acute intracranial pathology. Electronically Signed   By: Aram Candela M.D.   On: 11/04/2019 21:42   DG Chest Portable 1 View  Result Date: 11/22/2019 CLINICAL DATA:  Weakness.  Liver failure, confusion. EXAM: PORTABLE CHEST 1 VIEW COMPARISON:  10/24/2019 FINDINGS: Elevated right hemidiaphragm with right lower lobe atelectasis. Left lung is clear. Heart size and vascularity normal. IMPRESSION: Right lower lobe atelectasis.  No acute abnormality. Electronically Signed   By: Marlan Palau M.D.   On: 11/22/2019 17:39   US Abdomen Limited RUQ (LIVER/GB)  Result Date: 11/26/2019 CLINICAL DATA:  Bilirubinemia, alcoholism. EXAM: ULTRASOUND ABDOMEN LIMITED RIGHT UPPER QUADRANT COMPARISON:  10/24/2019 CT chest, abdomen and pelvis. 10/12/2019 abdominal ultrasound and prior. FINDINGS: Gallbladder: Surgically absent. Common bile duct: Diameter: 13 mm, compatible with post cholecystectomy sequela. Liver: No focal lesion identified. Heterogenous, echogenic parenchyma. Please note that ultrasound is limited in its sensitivity to evaluate a heterogenous liver for focal lesions. Portal vein is patent on color Doppler imaging with normal direction of blood flow towards the liver. Other: None. IMPRESSION: Heterogenous, echogenic hepatic parenchyma. No focal hepatic lesion. Post cholecystectomy sequela. Electronically Signed   By: Stana Bunting M.D.   On: 11/18/2019 15:45     Medications:   .  0.9 % NaCl with KCl 20 mEq / L 75 mL/hr at 11/23/19 0600  . piperacillin-tazobactam (ZOSYN)  IV  3.375 g (11/23/19 0830)  . potassium chloride 10 mEq (11/23/19 1218)   . Chlorhexidine Gluconate Cloth  6 each Topical Daily  . folic acid  1 mg Oral Daily  . heparin  5,000 Units Subcutaneous Q8H  . lactulose  20 g Oral BID  . LORazepam  0-4 mg Intravenous Q6H   Or  . LORazepam  0-4 mg Oral Q6H  . LORazepam  0-4 mg Intravenous Q12H   Or  . LORazepam  0-4 mg Oral Q12H  . oxcarbazepine  600 mg Oral BID  . pantoprazole (PROTONIX) IV  40 mg Intravenous Q24H  . rifaximin  550 mg Oral BID  . sodium chloride flush  10-40 mL Intracatheter Q12H  . spironolactone  25 mg Oral Daily  . thiamine  100 mg Oral Daily   Or  . thiamine  100 mg Intravenous Daily   docusate sodium, ondansetron (ZOFRAN) IV, polyethylene glycol, sodium chloride flush  Assessment/ Plan:  Ashlee Hawkins is a 39 y.o.  female with medical problems of alcohol abuse, who was [facility] to [facility] on[facility]18[facility]21for evaluation of seizures.  # Severe hyponatremia  Hypovolemic  Sodium  improved to 121 today with hydration IV fluids NS with KCL on flow Will continue monitoring  # Hypokalemia K+ 2.8  Receiving IV Potassium supplementation  # ETOH Intoxication # Alcohol Hepatitis  -Continue CIWAA protocol  -Seizure precautions  -Management per ICU team   LOS: 2 Lexxi Koslow 10/20/20211:29 PM

## 2019-11-24 DIAGNOSIS — K7031 Alcoholic cirrhosis of liver with ascites: Secondary | ICD-10-CM | POA: Diagnosis not present

## 2019-11-24 DIAGNOSIS — Z515 Encounter for palliative care: Secondary | ICD-10-CM | POA: Diagnosis not present

## 2019-11-24 DIAGNOSIS — Z7189 Other specified counseling: Secondary | ICD-10-CM | POA: Diagnosis not present

## 2019-11-24 DIAGNOSIS — K7290 Hepatic failure, unspecified without coma: Secondary | ICD-10-CM | POA: Diagnosis not present

## 2019-11-24 LAB — PHOSPHORUS: Phosphorus: UNDETERMINED mg/dL (ref 2.5–4.6)

## 2019-11-24 LAB — SODIUM
Sodium: 127 mmol/L — ABNORMAL LOW (ref 135–145)
Sodium: 128 mmol/L — ABNORMAL LOW (ref 135–145)
Sodium: 128 mmol/L — ABNORMAL LOW (ref 135–145)
Sodium: 130 mmol/L — ABNORMAL LOW (ref 135–145)

## 2019-11-24 LAB — CBC WITH DIFFERENTIAL/PLATELET
Abs Immature Granulocytes: 0.07 10*3/uL (ref 0.00–0.07)
Basophils Absolute: 0 10*3/uL (ref 0.0–0.1)
Basophils Relative: 1 %
Eosinophils Absolute: 0.3 10*3/uL (ref 0.0–0.5)
Eosinophils Relative: 4 %
HCT: 23.5 % — ABNORMAL LOW (ref 36.0–46.0)
Hemoglobin: 8.1 g/dL — ABNORMAL LOW (ref 12.0–15.0)
Immature Granulocytes: 1 %
Lymphocytes Relative: 12 %
Lymphs Abs: 1 10*3/uL (ref 0.7–4.0)
MCH: 37.3 pg — ABNORMAL HIGH (ref 26.0–34.0)
MCHC: 34.5 g/dL (ref 30.0–36.0)
MCV: 108.3 fL — ABNORMAL HIGH (ref 80.0–100.0)
Monocytes Absolute: 0.9 10*3/uL (ref 0.1–1.0)
Monocytes Relative: 11 %
Neutro Abs: 6.1 10*3/uL (ref 1.7–7.7)
Neutrophils Relative %: 71 %
Platelets: 173 10*3/uL (ref 150–400)
RBC: 2.17 MIL/uL — ABNORMAL LOW (ref 3.87–5.11)
RDW: 17.6 % — ABNORMAL HIGH (ref 11.5–15.5)
WBC: 8.5 10*3/uL (ref 4.0–10.5)
nRBC: 0 % (ref 0.0–0.2)

## 2019-11-24 LAB — GLUCOSE, CAPILLARY
Glucose-Capillary: 103 mg/dL — ABNORMAL HIGH (ref 70–99)
Glucose-Capillary: 107 mg/dL — ABNORMAL HIGH (ref 70–99)
Glucose-Capillary: 81 mg/dL (ref 70–99)
Glucose-Capillary: 82 mg/dL (ref 70–99)
Glucose-Capillary: 89 mg/dL (ref 70–99)
Glucose-Capillary: 93 mg/dL (ref 70–99)

## 2019-11-24 LAB — HEPATIC FUNCTION PANEL
ALT: 64 U/L — ABNORMAL HIGH (ref 0–44)
AST: 298 U/L — ABNORMAL HIGH (ref 15–41)
Albumin: 2.1 g/dL — ABNORMAL LOW (ref 3.5–5.0)
Alkaline Phosphatase: 383 U/L — ABNORMAL HIGH (ref 38–126)
Bilirubin, Direct: 13.9 mg/dL — ABNORMAL HIGH (ref 0.0–0.2)
Indirect Bilirubin: 7.7 mg/dL — ABNORMAL HIGH (ref 0.3–0.9)
Total Bilirubin: 21.6 mg/dL (ref 0.3–1.2)
Total Protein: 5.3 g/dL — ABNORMAL LOW (ref 6.5–8.1)

## 2019-11-24 LAB — COMPREHENSIVE METABOLIC PANEL
ALT: 70 U/L — ABNORMAL HIGH (ref 0–44)
AST: 328 U/L — ABNORMAL HIGH (ref 15–41)
Albumin: 2.3 g/dL — ABNORMAL LOW (ref 3.5–5.0)
Alkaline Phosphatase: 418 U/L — ABNORMAL HIGH (ref 38–126)
Anion gap: 11 (ref 5–15)
BUN: <5 mg/dL — ABNORMAL LOW (ref 6–20)
CO2: 33 mmol/L — ABNORMAL HIGH (ref 22–32)
Calcium: 7.6 mg/dL — ABNORMAL LOW (ref 8.9–10.3)
Chloride: 84 mmol/L — ABNORMAL LOW (ref 98–111)
Creatinine, Ser: <0.3 mg/dL — ABNORMAL LOW (ref 0.44–1.00)
Glucose, Bld: 93 mg/dL (ref 70–99)
Potassium: 2.9 mmol/L — ABNORMAL LOW (ref 3.5–5.1)
Sodium: 128 mmol/L — ABNORMAL LOW (ref 135–145)
Total Bilirubin: 23.7 mg/dL (ref 0.3–1.2)
Total Protein: 5.7 g/dL — ABNORMAL LOW (ref 6.5–8.1)

## 2019-11-24 LAB — HEPATITIS PANEL, ACUTE
HCV Ab: NONREACTIVE
Hep A IgM: NONREACTIVE
Hep B C IgM: NONREACTIVE
Hepatitis B Surface Ag: NONREACTIVE

## 2019-11-24 LAB — AMYLASE: Amylase: 131 U/L — ABNORMAL HIGH (ref 28–100)

## 2019-11-24 LAB — POTASSIUM: Potassium: 3.4 mmol/L — ABNORMAL LOW (ref 3.5–5.1)

## 2019-11-24 LAB — LIPASE, BLOOD: Lipase: 80 U/L — ABNORMAL HIGH (ref 11–51)

## 2019-11-24 LAB — AMMONIA: Ammonia: 68 umol/L — ABNORMAL HIGH (ref 9–35)

## 2019-11-24 LAB — MAGNESIUM: Magnesium: 2.3 mg/dL (ref 1.7–2.4)

## 2019-11-24 MED ORDER — POTASSIUM CHLORIDE 20 MEQ PO PACK
20.0000 meq | PACK | Freq: Once | ORAL | Status: AC
  Administered 2019-11-24: 20 meq via ORAL
  Filled 2019-11-24: qty 1

## 2019-11-24 MED ORDER — WHITE PETROLATUM EX OINT
TOPICAL_OINTMENT | CUTANEOUS | Status: DC | PRN
  Administered 2019-11-24: 1 via TOPICAL
  Filled 2019-11-24 (×2): qty 5

## 2019-11-24 MED ORDER — POTASSIUM CHLORIDE 10 MEQ/100ML IV SOLN
10.0000 meq | INTRAVENOUS | Status: AC
  Administered 2019-11-24 (×6): 10 meq via INTRAVENOUS
  Filled 2019-11-24 (×6): qty 100

## 2019-11-24 NOTE — Progress Notes (Signed)
Annabelle Harman NP and Cheryll Cockayne NP made aware of critical Total Bilirubin of 23.7.

## 2019-11-24 NOTE — Progress Notes (Signed)
Daily Progress Note   Patient Name: Ashlee Hawkins       Date: 11/24/2019 DOB: Oct 07, 1980  Age: 39 y.o. MRN#: [id number] Attending Physician: Erin Fulling, MD Primary Care Physician: [facility] Admit Date: November 29, 2019  Reason for Consultation/Follow-up: Establishing goals of care  Subjective: Patient looks at me briefly, unable to follow commands or respond verbally.  Length of Stay: 3  Current Medications: Scheduled Meds:  . Chlorhexidine Gluconate Cloth  6 each Topical Daily  . folic acid  1 mg Oral Daily  . heparin  5,000 Units Subcutaneous Q8H  . lactulose  20 g Oral BID  . LORazepam  0-4 mg Intravenous Q12H   Or  . LORazepam  0-4 mg Oral Q12H  . mouth rinse  15 mL Mouth Rinse BID  . oxcarbazepine  600 mg Oral BID  . pantoprazole (PROTONIX) IV  40 mg Intravenous Q24H  . rifaximin  550 mg Oral BID  . sodium chloride flush  10-40 mL Intracatheter Q12H  . spironolactone  25 mg Oral Daily  . thiamine  100 mg Oral Daily   Or  . thiamine  100 mg Intravenous Daily    Continuous Infusions: . dextrose 5 % and 0.9 % NaCl with KCl 20 mEq/L 75 mL/hr at 11/24/19 0904  . piperacillin-tazobactam (ZOSYN)  IV 3.375 g (11/24/19 0738)    PRN Meds: docusate sodium, ondansetron (ZOFRAN) IV, polyethylene glycol, sodium chloride flush, white petrolatum  Physical Exam Constitutional:      Comments: lethargic  Pulmonary:     Effort: Pulmonary effort is normal. No respiratory distress.  Skin:    General: Skin is warm and dry.     Coloration: Skin is jaundiced.  Neurological:     Mental Status: She is disoriented.             Vital Signs: BP 118/82   Pulse 100   Temp 98.2 F (36.8 C) (Oral)   Resp (!) 22   Ht 5\' 3"  (1.6 m)   Wt 91.1 kg   SpO2 96%   BMI 35.58 kg/m  SpO2:  SpO2: 96 % O2 Device: O2 Device: Nasal Cannula O2 Flow Rate: O2 Flow Rate (L/min): 1 L/min  Intake/output summary:   Intake/Output Summary (Last 24 hours) at 11/24/2019 1420 Last data filed at 11/24/2019 1200 Gross per 24 hour  Intake 3177.44 ml  Output 2101 ml  Net 1076.44 ml   LBM: Last BM Date: 11/24/19 Baseline Weight: Weight: 82.1 kg Most recent weight: Weight: 91.1 kg       Palliative Assessment/Data: PPS 10%    Flowsheet Rows     Most Recent Value  Intake Tab  Referral Department Critical care  Unit at Time of Referral ICU  Palliative Care Primary Diagnosis Other (Comment)  [GI]  Date Notified 11/23/19  Palliative Care Type New Palliative care  Reason for referral Clarify Goals of Care  Date of Admission November 29, 2019  Date first seen by Palliative Care 11/23/19  # of days Palliative referral response time 0 Day(s)  # of days IP prior to Palliative referral 2  Clinical Assessment  Palliative Performance Scale Score 20%  Psychosocial & Spiritual Assessment  Palliative Care Outcomes  Patient/Family meeting held? Yes  Who was at the meeting? spouse  Palliative Care Outcomes Clarified goals of care      Patient Active Problem List   Diagnosis Date Noted  . Goals of care, counseling/discussion   . Palliative care by specialist   . Alcoholic cirrhosis of liver with ascites (HCC)   . Hepatic encephalopathy (HCC)   . Hyponatremia with decreased serum osmolality Dec 08, 2019  . Alcoholic hepatitis 10/12/2019  . Hyponatremia 10/12/2019    Palliative Care Assessment & Plan   HPI: 39 y.o. female  with past medical history of ETOH abuse, seizures, and alcoholic hepatitis admitted on 12-08-2019 with LUQ pain and seizure at home. CT of abdomen revealed enlarged fatty liver. Found to have severe hyponatremia. She is at high risk for aspiration and intubation. PMT consulted to discuss GOC.   Assessment: Discuss with RN.   Follow up with patient's spouse. Discuss  patient's liver failure. Discuss worsening labs. Discuss unchanged mental status. Discuss inability to maintain PO intake. Shared with Jonny Ruiz medical team is concerned patient is in dying process. Jonny Ruiz is emotional throughout conversation. He shares how difficult it is to hear how patient is doing and how hard it is to make decisions. He shares that a family member who has a medical background is coming to see patient today and he wants to wait until they see her to make any further decisions. We discussed a need to discuss how to approach her care moving forward - he requests time to process news and requests that we continue discussion tomorrow.   Recommendations/Plan:  Ongoing goals of care - spouse agrees to continue discussion tomorrow after family visits today, spouse aware of severity of illness  Full code/full scope for now  Code Status:  Full code  Discharge Planning:  To Be Determined  Care plan was discussed with Dr. Belia Heman, patient's spouse  Thank you for allowing the Palliative Medicine Team to assist in the care of this patient.   Total Time 35 minutes Prolonged Time Billed  no       Greater than 50%  of this time was spent counseling and coordinating care related to the above assessment and plan.  Gerlean Ren, DNP, [facility] Palliative Medicine Team Team Phone # [phone]  Pager [phone]

## 2019-11-24 NOTE — Progress Notes (Signed)
[facility]  ROUNDING NOTE   Subjective:  [name] is a 39 y.o. female with medical problems of alcohol abuse, who was admitted to [facility] on 11/13/2019 for evaluation of seizures.  Patient appears lethargic, in no acute respiratory distress.She is on supplementary O2 via [place] 2L/min.  Objective:  Vital signs in last 24 hours:  Temp:  [97.3 F (36.3 C)-98.2 F (36.8 C)] 98.2 F (36.8 C) (10/21 1200) Pulse Rate:  [94-104] 100 (10/21 0600) Resp:  [19-27] 22 (10/21 0600) BP: (100-122)/(59-104) 118/82 (10/21 0600) SpO2:  [90 %-100 %] 96 % (10/21 0600) Weight:  [91.1 kg] 91.1 kg (10/21 0351)  Weight change: 1.4 kg Filed Weights   11/22/19 0441 11/23/19 0347 11/24/19 0351  Weight: 90.1 kg 89.7 kg 91.1 kg    Intake/Output: I/O last 3 completed shifts: In: 4278.4 [P.O.:180; I.V.:2592.1; IV Piggyback:1506.3] Out: 2404 [Urine:853; Stool:1551]   Intake/Output this shift:  Total I/O In: 968.6 [I.V.:446.3; IV Piggyback:522.4] Out: 300 [Urine:300]  Physical Exam: General: Sleeping,arousable,but drowsy  Head: Moist oral mucosal membranes  Eyes: Sclerae and conjunctivae clear  Neck: Supple, trachea at midline  Lungs:  Normal and symmetrical effort,Lungs clear  Heart: S1S2 ,Regular rate and rhythm  Abdomen:  Soft, nontender, non distended  Extremities:  No  peripheral edema  Neurologic:  lethargic  Skin: jaundiced    Basic Metabolic Panel: Recent Labs  Lab 12/03/2019 1333 11/15/2019 1456 11/18/2019 1527 11/30/2019 1527 12/04/2019 1814 11/11/2019 2036 11/22/19 0048 11/22/19 0048 11/22/19 0428 11/22/19 1024 11/22/19 1907 11/22/19 2305 11/23/19 0341 11/23/19 1410 11/23/19 1747 11/23/19 2128 11/24/19 0431 11/24/19 0610 11/24/19 0956  NA   < >  --  111*   < >   < > 116* 117*   < > 116*   < > 117*   < > 121*   < > 124* 126* 128* 127* 128*  K   < >  --  <2.0*   < >   < > 2.0* 2.1*   < > 2.2*  --  2.4*  --  2.8*  --  3.2*  --  2.9*  --   --   CL   < >  --  <65*  --   --    --  65*  --  66*  --   --   --  74*  --   --   --  84*  --   --   CO2   < >  --  23  --   --   --  28  --  34*  --   --   --  35*  --   --   --  33*  --   --   GLUCOSE   < >  --  127*  --   --   --  119*  --  123*  --   --   --  91  --   --   --  93  --   --   BUN   < >  --  <5*  --   --   --  <5*  --  <5*  --   --   --  <5*  --   --   --  <5*  --   --   CREATININE   < >  --  <0.30*  --   --   --  <0.30*  --  0.34*  --   --   --  <  0.30*  --   --   --  <0.30*  --   --   CALCIUM   < >  --  7.8*   < >  --   --  7.6*   < > 7.5*  --   --   --  7.1*  --   --   --  7.6*  --   --   MG  --  1.7  --   --   --  1.5* 2.9*  --   --   --   --   --  2.4  --   --   --  2.3  --   --   PHOS  --   --   --   --   --   --   --   --   --   --   --   --  UNABLE TO REPORT DUE TO ICTERUS  --   --   --  UNABLE TO REPORT DUE TO ICTERUS  --   --    < > = values in this interval not displayed.    Liver Function Tests: Recent Labs  Lab 11/08/2019 1333 11/22/19 0048 11/23/19 0341 11/24/19 0431 11/24/19 0956  AST 313* 303* 317* 328* 298*  ALT 66* 65* 62* 70* 64*  ALKPHOS 476* 432* 357* 418* 383*  BILITOT 20.1* 20.1* 21.5* 23.7* 21.6*  PROT 6.3* 6.2* 5.6* 5.7* 5.3*  ALBUMIN 2.7* 2.5* 2.3* 2.3* 2.1*   Recent Labs  Lab 11/24/19 0610  LIPASE 80*  AMYLASE 131*   Recent Labs  Lab 11/13/2019 1334 11/24/19 0611  AMMONIA 106* 68*    CBC: Recent Labs  Lab 11/06/2019 1333 11/22/19 0048 11/23/19 0341 11/24/19 0431  WBC 11.9* 10.0 8.7 8.5  NEUTROABS  --   --  6.7 6.1  HGB 9.6* 9.3* 8.4* 8.1*  HCT 26.3* 25.9* 23.2* 23.5*  MCV 101.5* 103.2* 104.0* 108.3*  PLT 158 142* 146* 173    Cardiac Enzymes: No results for input(s): CKTOTAL, CKMB, CKMBINDEX, TROPONINI in the last 168 hours.  BNP: Invalid input(s): POCBNP  CBG: Recent Labs  Lab 11/23/19 1959 11/23/19 2334 11/24/19 0350 11/24/19 0733 11/24/19 1126  GLUCAP 74 91 89 82 93    Microbiology: Results for orders placed or performed during the  hospital encounter of 11/12/2019  Blood culture (routine x 2)     Status: None (Preliminary result)   Collection Time: 11/10/2019  3:49 PM   Specimen: BLOOD  Result Value Ref Range Status   Specimen Description BLOOD BLOOD RIGHT HAND  Final   Special Requests   Final    BOTTLES DRAWN AEROBIC AND ANAEROBIC Blood Culture adequate volume   Culture   Final    NO GROWTH 3 DAYS Performed at [facility], [address], [place], [place] [address]    Report Status PENDING  Incomplete  Blood culture (routine x 2)     Status: None (Preliminary result)   Collection Time: 11/14/2019  4:40 PM   Specimen: BLOOD  Result Value Ref Range Status   Specimen Description BLOOD LEFT SHOULDER  Final   Special Requests   Final    BOTTLES DRAWN AEROBIC AND ANAEROBIC Blood Culture adequate volume   Culture   Final    NO GROWTH 3 DAYS Performed at [facility], [address] [place], [place] [address]    Report Status PENDING  Incomplete  Respiratory Panel by RT PCR (Flu A&B, Covid) -  Nasopharyngeal Swab     Status: None   Collection Time: 11/30/19  8:45 PM   Specimen: Nasopharyngeal Swab  Result Value Ref Range Status   SARS Coronavirus 2 by RT PCR NEGATIVE NEGATIVE Final    Comment: (NOTE) SARS-CoV-2 target nucleic acids are NOT DETECTED.  The SARS-CoV-2 RNA is generally detectable in upper respiratoy specimens during the acute phase of infection. The lowest concentration of SARS-CoV-2 viral copies this assay can detect is 131 copies/mL. A negative result does not preclude SARS-Cov-2 infection and should not be used as the sole basis for treatment or other patient management decisions. A negative result may occur with  improper specimen collection/handling, submission of specimen other than nasopharyngeal swab, presence of viral mutation(s) within the areas targeted by this assay, and inadequate number of viral copies (<131 copies/mL). A negative result must be combined with  clinical observations, patient history, and epidemiological information. The expected result is Negative.  Fact Sheet for Patients:  https://www.moore.com/  Fact Sheet for Healthcare Providers:  https://www.young.biz/  This test is no t yet approved or cleared by the [place] FDA and  has been authorized for detection and/or diagnosis of SARS-CoV-2 by FDA under an Emergency Use Authorization (EUA). This EUA will remain  in effect (meaning this test can be used) for the duration of the COVID-19 declaration under Section 564(b)(1) of the Act, 21 U.S.C. section 360bbb-3(b)(1), unless the authorization is terminated or revoked sooner.     Influenza A by PCR NEGATIVE NEGATIVE Final   Influenza B by PCR NEGATIVE NEGATIVE Final    Comment: (NOTE) The Xpert Xpress SARS-CoV-2/FLU/RSV assay is intended as an aid in  the diagnosis of influenza from Nasopharyngeal swab specimens and  should not be used as a sole basis for treatment. Nasal washings and  aspirates are unacceptable for Xpert Xpress SARS-CoV-2/FLU/RSV  testing.  Fact Sheet for Patients: https://www.moore.com/  Fact Sheet for Healthcare Providers: https://www.young.biz/  This test is not yet approved or cleared by the [place] FDA and  has been authorized for detection and/or diagnosis of SARS-CoV-2 by  FDA under an Emergency Use Authorization (EUA). This EUA will remain  in effect (meaning this test can be used) for the duration of the  Covid-19 declaration under Section 564(b)(1) of the Act, 21  U.S.C. section 360bbb-3(b)(1), unless the authorization is  terminated or revoked. Performed at [facility], [address] [place], [place] [address]   MRSA PCR Screening     Status: None   Collection Time: 11/22/19 12:40 AM   Specimen: Nasal Mucosa; Nasopharyngeal  Result Value Ref Range Status   MRSA by PCR NEGATIVE NEGATIVE  Final    Comment:        The GeneXpert MRSA Assay (FDA approved for NASAL specimens only), is one component of a comprehensive MRSA colonization surveillance program. It is not intended to diagnose MRSA infection nor to guide or monitor treatment for MRSA infections. Performed at [facility], [address], [place], [place] [address]   Urine Culture     Status: Abnormal   Collection Time: 11/22/19  1:50 AM   Specimen: Urine, Random  Result Value Ref Range Status   Specimen Description   Final    URINE, RANDOM Performed at [facility], [address], [place], [place] [address]    Special Requests   Final    NONE Performed at [facility], [address] [place], [place] [address]    Culture MULTIPLE SPECIES PRESENT,  SUGGEST RECOLLECTION (A)  Final   Report Status 11/23/2019 FINAL  Final    Coagulation Studies: Recent Labs    11/20/2019 1456  LABPROT 16.1*  INR 1.3*    Urinalysis: Recent Labs    11/11/2019 2311  COLORURINE AMBER*  LABSPEC 1.025  PHURINE 5.0  GLUCOSEU NEGATIVE  HGBUR SMALL*  BILIRUBINUR MODERATE*  KETONESUR 20*  PROTEINUR 30*  NITRITE POSITIVE*  LEUKOCYTESUR NEGATIVE      Imaging: EEG  Result Date: 11/22/2019 Charlsie Quest, MD     11/22/2019  5:15 PM Patient Name: MARIJEAN MONTANYE MRN: [id number] Epilepsy Attending: Charlsie Quest Referring Physician/Provider: Harlon Ditty, NP Date: 11/22/2019 Duration: 26.14 mins Patient history: 39 year old female with breakthrough seizure in the setting of heavy EtOH abuse and severe electrolyte disturbance, as well as noncompliance with her anticonvulsant. EEG to evaluate for seizure Level of alertness: Awake AEDs during EEG study: OXC, Ativan Technical aspects: This EEG study was done with scalp electrodes positioned according to the 10-20 International system of electrode placement. Electrical activity was acquired at a sampling rate of 500Hz  and reviewed with a high  frequency filter of 70Hz  and a low frequency filter of 1Hz . EEG data were recorded continuously and digitally stored. Description: The posterior dominant rhythm consists of 9 Hz activity of moderate voltage (25-35 uV) seen predominantly in posterior head regions, symmetric and reactive to eye opening and eye closing. EEG showed continuous generalized 3 to 6 Hz theta-delta slowing. Hyperventilation and photic stimulation were not performed.   ABNORMALITY -Continuous  slow, generalized IMPRESSION: This study is suggestive of mild diffuse encephalopathy, nonspecific etiology. No seizures or epileptiform discharges were seen throughout the recording. Priyanka     Medications:   . dextrose 5 % and 0.9 % NaCl with KCl 20 mEq/L 75 mL/hr at 11/24/19 0904  . piperacillin-tazobactam (ZOSYN)  IV 3.375 g (11/24/19 0738)  . potassium chloride 10 mEq (11/24/19 1230)   . Chlorhexidine Gluconate Cloth  6 each Topical Daily  . folic acid  1 mg Oral Daily  . heparin  5,000 Units Subcutaneous Q8H  . lactulose  20 g Oral BID  . LORazepam  0-4 mg Intravenous Q12H   Or  . LORazepam  0-4 mg Oral Q12H  . mouth rinse  15 mL Mouth Rinse BID  . oxcarbazepine  600 mg Oral BID  . pantoprazole (PROTONIX) IV  40 mg Intravenous Q24H  . rifaximin  550 mg Oral BID  . sodium chloride flush  10-40 mL Intracatheter Q12H  . spironolactone  25 mg Oral Daily  . thiamine  100 mg Oral Daily   Or  . thiamine  100 mg Intravenous Daily   docusate sodium, ondansetron (ZOFRAN) IV, polyethylene glycol, sodium chloride flush, white petrolatum  Assessment/ Plan:  Ms. Ashlee Hawkins is a 39 y.o.  female with medical problems of alcohol abuse, who was admitted to [facility] on[facility]18/[facility]21for evaluation of seizures.  # Hyponatremia   Sodium  improved to 128 Will continue monitoring  # Hypokalemia K+ 2.9 Receiving IV KCL  # ETOH Intoxication # Alcohol Hepatitis  -Continue CIWAA protocol  -Seizure precautions   -Management per ICU team   LOS: 3 Lakeyta Vandenheuvel 10/21/202112:31 PM

## 2019-11-24 NOTE — Consult Note (Signed)
PHARMACY CONSULT NOTE - FOLLOW UP  Pharmacy Consult for Electrolyte Monitoring and Replacement   Recent Labs: Potassium (mmol/L)  Date Value  11/24/2019 2.9 (L)   Magnesium (mg/dL)  Date Value  16/11/9602 2.3   Calcium (mg/dL)  Date Value  54/10/8117 7.6 (L)   Albumin (g/dL)  Date Value  14/78/2956 2.3 (L)   Phosphorus (mg/dL)  Date Value  21/30/8657 UNABLE TO REPORT DUE TO ICTERUS   Sodium (mmol/L)  Date Value  11/24/2019 128 (L)     Assessment: 39yo female with PMH bipolar disorder, HTN, alcohol abuse with alcoholic hepatitis who came to ED c/o seizure occurring ~0900 10/18. Patient reports drinking her usual amount including 5 shots this morning. Patient speech noted to be slow and deliberate. Skin and eyes noted to have yellow tone. Pharmacy has been consulted for monitoring electrolytes.   Hypertonic saline on hold. Nephro is following.  MIVF NS w/ 20 mEq K at 75 ml/hr K remains low despite aggressive replacement  Goal of Therapy:  WNL  Plan:  K 3.2 s/p 6 runs of potassium 10 mEq IV; patient is also still has MIVF with K @75ml /hr.  Will give additional KCl IV x 3  Monitor electrolytes with morning labs.  10/21:  K @ 0431 = 2.9 Will order KCl 10 mEq IV X 6 to start on 10/21 @ 0700. Will recheck K on 10/21 @ 1400.   Gael Londo D, PharmD 11/24/2019 6:05 AM

## 2019-11-24 NOTE — Consult Note (Signed)
PHARMACY CONSULT NOTE - FOLLOW UP  Pharmacy Consult for Electrolyte Monitoring and Replacement   Recent Labs: Potassium (mmol/L)  Date Value  11/24/2019 2.9 (L)   Magnesium (mg/dL)  Date Value  50/38/8828 2.3   Calcium (mg/dL)  Date Value  00/34/9179 7.6 (L)   Albumin (g/dL)  Date Value  15/06/6977 2.1 (L)   Phosphorus (mg/dL)  Date Value  48/02/6551 UNABLE TO REPORT DUE TO ICTERUS   Sodium (mmol/L)  Date Value  11/24/2019 128 (L)     Assessment: 39yo female with PMH bipolar disorder, HTN, alcohol abuse with alcoholic hepatitis who came to ED c/o seizure occurring ~0900 10/18. Patient reports drinking her usual amount including 5 shots this morning. Patient speech noted to be slow and deliberate. Skin and eyes noted to have yellow tone. Pharmacy has been consulted for monitoring electrolytes.   MIVF D5NS w/ 20 mEq K at 75 ml/hr   Goal of Therapy:  WNL  Plan:  Patient has received potassium 10 mEq IV x 6 runs and remains on MIVF with potassium. Will recheck potassium at 1700. All electrolytes with morning labs.  Pricilla Riffle, PharmD Clinical Pharmacist 11/24/2019 3:30 PM

## 2019-11-24 NOTE — Progress Notes (Signed)
Pharmacy aware of K+of 2.9, will order replacement KCL.

## 2019-11-24 NOTE — Progress Notes (Addendum)
CRITICAL CARE NOTE 39 yo female with a significant history of alcohol hepatitisand seizurespresenting to the ED with LUQ pain and reports of a seizure at home admitted to the ICU for monitoring.  SEPSIS HAS BEEN RULED OUT   Significant Diagnostic Tests:  2019-12-07 CT head > negative 12-07-2019 CT abdomen>enlarged fatty liver, colonic diverticulosis, aortic atherosclerosis 10/19 severe DT's 10/20 worsening liver failure Micro Data:  10/18 BC >> 10/18 COVID 19 >> negative 10/18 Influenza A/B >> negative 10/18 UA >>   CC  follow up liver failure  SUBJECTIVE Patient remains critically ill Prognosis is guarded Patient is dying process with liver failure   BP 118/82   Pulse 100   Temp 98.1 F (36.7 C) (Oral)   Resp (!) 22   Ht 5\' 3"  (1.6 m)   Wt 91.1 kg   SpO2 96%   BMI 35.58 kg/m    I/O last 3 completed shifts: In: 4278.4 [P.O.:180; I.V.:2592.1; IV Piggyback:1506.3] Out: 2404 [Urine:853; Stool:1551] No intake/output data recorded.  SpO2: 96 % O2 Flow Rate (L/min): 1 L/min  Estimated body mass index is 35.58 kg/m as calculated from the following:   Height as of this encounter: 5\' 3"  (1.6 m).   Weight as of this encounter: 91.1 kg.  SIGNIFICANT EVENTS   REVIEW OF SYSTEMS  PATIENT IS UNABLE TO PROVIDE COMPLETE REVIEW OF SYSTEMS DUE TO SEVERE CRITICAL ILLNESS        PHYSICAL EXAMINATION:  GENERAL:critically ill appearing,  HEAD: Normocephalic, atraumatic.  EYES: + scleral icterus.  MOUTH: Moist mucosal membrane. NECK: Supple.  PULMONARY: +rhonchi,  CARDIOVASCULAR: S1 and S2. Regular rate and rhythm. No murmurs, rubs, or gallops.  GASTROINTESTINAL: Soft, nontender, -distended.  Positive bowel sounds.   MUSCULOSKELETAL: +edema.  NEUROLOGIC: obtunded SKIN:intact,warm,dry  MEDICATIONS: I have reviewed all medications and confirmed regimen as documented   CULTURE RESULTS   Recent Results (from the past 240 hour(s))  Blood culture (routine x 2)      Status: None (Preliminary result)   Collection Time: 2019/12/07  3:49 PM   Specimen: BLOOD  Result Value Ref Range Status   Specimen Description BLOOD BLOOD RIGHT HAND  Final   Special Requests   Final    BOTTLES DRAWN AEROBIC AND ANAEROBIC Blood Culture adequate volume   Culture   Final    NO GROWTH 3 DAYS Performed at [facility], [address], [place], [address] [place]    Report Status PENDING  Incomplete  Blood culture (routine x 2)     Status: None (Preliminary result)   Collection Time: 12/07/2019  4:40 PM   Specimen: BLOOD  Result Value Ref Range Status   Specimen Description BLOOD LEFT SHOULDER  Final   Special Requests   Final    BOTTLES DRAWN AEROBIC AND ANAEROBIC Blood Culture adequate volume   Culture   Final    NO GROWTH 3 DAYS Performed at [facility], [address] [place], [address] [place]    Report Status PENDING  Incomplete  Respiratory Panel by RT PCR (Flu A&B, Covid) - Nasopharyngeal Swab     Status: None   Collection Time: 2019/12/07  8:45 PM   Specimen: Nasopharyngeal Swab  Result Value Ref Range Status   SARS Coronavirus 2 by RT PCR NEGATIVE NEGATIVE Final    Comment: (NOTE) SARS-CoV-2 target nucleic acids are NOT DETECTED.  The SARS-CoV-2 RNA is generally detectable in upper respiratoy specimens during the acute phase of infection. The lowest concentration of SARS-CoV-2 viral copies this  assay can detect is 131 copies/mL. A negative result does not preclude SARS-Cov-2 infection and should not be used as the sole basis for treatment or other patient management decisions. A negative result may occur with  improper specimen collection/handling, submission of specimen other than nasopharyngeal swab, presence of viral mutation(s) within the areas targeted by this assay, and inadequate number of viral copies (<131 copies/mL). A negative result must be combined with clinical observations, patient history, and epidemiological  information. The expected result is Negative.  Fact Sheet for Patients:  https://www.moore.com/  Fact Sheet for Healthcare Providers:  https://www.young.biz/  This test is no t yet approved or cleared by the [place] FDA and  has been authorized for detection and/or diagnosis of SARS-CoV-2 by FDA under an Emergency Use Authorization (EUA). This EUA will remain  in effect (meaning this test can be used) for the duration of the COVID-19 declaration under Section 564(b)(1) of the Act, 21 U.S.C. section 360bbb-3(b)(1), unless the authorization is terminated or revoked sooner.     Influenza A by PCR NEGATIVE NEGATIVE Final   Influenza B by PCR NEGATIVE NEGATIVE Final    Comment: (NOTE) The Xpert Xpress SARS-CoV-2/FLU/RSV assay is intended as an aid in  the diagnosis of influenza from Nasopharyngeal swab specimens and  should not be used as a sole basis for treatment. Nasal washings and  aspirates are unacceptable for Xpert Xpress SARS-CoV-2/FLU/RSV  testing.  Fact Sheet for Patients: https://www.moore.com/  Fact Sheet for Healthcare Providers: https://www.young.biz/  This test is not yet approved or cleared by the [place] FDA and  has been authorized for detection and/or diagnosis of SARS-CoV-2 by  FDA under an Emergency Use Authorization (EUA). This EUA will remain  in effect (meaning this test can be used) for the duration of the  Covid-19 declaration under Section 564(b)(1) of the Act, 21  U.S.C. section 360bbb-3(b)(1), unless the authorization is  terminated or revoked. Performed at [facility], [address] [place], [place] [address]   MRSA PCR Screening     Status: None   Collection Time: 11/22/19 12:40 AM   Specimen: Nasal Mucosa; Nasopharyngeal  Result Value Ref Range Status   MRSA by PCR NEGATIVE NEGATIVE Final    Comment:        The GeneXpert MRSA Assay  (FDA approved for NASAL specimens only), is one component of a comprehensive MRSA colonization surveillance program. It is not intended to diagnose MRSA infection nor to guide or monitor treatment for MRSA infections. Performed at [facility], [address] [place], [place] [address]   Urine Culture     Status: Abnormal   Collection Time: 11/22/19  1:50 AM   Specimen: Urine, Random  Result Value Ref Range Status   Specimen Description   Final    URINE, RANDOM Performed at [facility], [address], [place], [place] [address]    Special Requests   Final    NONE Performed at [facility], [address] [place], [place] [address]    Culture MULTIPLE SPECIES PRESENT, SUGGEST RECOLLECTION (A)  Final   Report Status 11/23/2019 FINAL  Final      CMP Latest Ref Rng & Units 11/24/2019 11/24/2019 11/23/2019  Glucose 70 - 99 mg/dL - 93 -  BUN 6 - 20 mg/dL - <7(M) -  Creatinine 0.94 - 1.00 mg/dL - <7.09(G) -  Sodium 283 - 145 mmol/L 127(L) 128(L) 126(L)  Potassium 3.5 - 5.1 mmol/L - 2.9(L) -  Chloride 98 -  111 mmol/L - 84(L) -  CO2 22 - 32 mmol/L - 33(H) -  Calcium 8.9 - 10.3 mg/dL - 7.6(L) -  Total Protein 6.5 - 8.1 g/dL - 5.7(L) -  Total Bilirubin 0.3 - 1.2 mg/dL - 23.7(HH) -  Alkaline Phos 38 - 126 U/L - 418(H) -  AST 15 - 41 U/L - 328(H) -  ALT 0 - 44 U/L - 70(H) -      ASSESSMENT AND PLAN SYNOPSIS  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   Will certainly impact respiratory mechanics   ACUTE KIDNEY INJURY/Renal Failure -continue Foley Catheter-assess need -Avoid nephrotoxic agents -Follow urine output, BMP -Ensure adequate renal perfusion, optimize oxygenation -Renal dose medications   Acute toxic metabolic encephalopathy Lactulose as tolerated  CARDIAC ICU monitoring  ID -continue IV abx as  prescibed -follow up cultures  DIET--> as tolerated Constipation protocol as indicated  ENDO - will use ICU hypoglycemic\Hyperglycemia protocol if indicated     ELECTROLYTES -follow labs as needed -replace as needed -pharmacy consultation and following   DVT/GI PRX ordered and assessed TRANSFUSIONS AS NEEDED MONITOR FSBS I Assessed the need for Labs I Assessed the need for Foley I Assessed the need for Central Venous Line Family Discussion when available I Assessed the need for Mobilization I made an Assessment of medications to be adjusted accordingly Safety Risk assessment completed   CASE DISCUSSED IN MULTIDISCIPLINARY ROUNDS WITH ICU TEAM  Critical Care Time devoted to patient care services described in this note is 75 minutes.   Overall, patient is critically ill, prognosis is guarded.  Patient with Multiorgan failure and at high risk for cardiac arrest and death.   Patient with progressive liver failure and very poor chance of survival, patient with irreversible liver failure Palliative care team consulted   Recommend DNR status and COMFORT CARE MEASURES  Radie Berges Santiago Glad, M.D.  [facility]  Medical Director [facility] Medical Director [facility] Cardio-Pulmonary Department

## 2019-11-24 NOTE — Consult Note (Signed)
PHARMACY CONSULT NOTE - FOLLOW UP  Pharmacy Consult for Electrolyte Monitoring and Replacement   Recent Labs: Potassium (mmol/L)  Date Value  11/24/2019 3.4 (L)   Magnesium (mg/dL)  Date Value  95/74/7340 2.3   Calcium (mg/dL)  Date Value  37/10/6436 7.6 (L)   Albumin (g/dL)  Date Value  38/18/4037 2.1 (L)   Phosphorus (mg/dL)  Date Value  54/36/0677 UNABLE TO REPORT DUE TO ICTERUS   Sodium (mmol/L)  Date Value  11/24/2019 128 (L)     Assessment: 39yo female with PMH bipolar disorder, HTN, alcohol abuse with alcoholic hepatitis who came to ED c/o seizure occurring ~0900 10/18. Patient reports drinking her usual amount including 5 shots this morning. Patient speech noted to be slow and deliberate. Skin and eyes noted to have yellow tone. Pharmacy has been consulted for monitoring electrolytes.   MIVF D5NS w/ 20 mEq K at 75 ml/hr   Goal of Therapy:  WNL  Plan:  K 3.4 s/p potassium 10 mEq IV x 6 runs and remains on MIVF with potassium. Will give KCl packet x1 Will recheck potassium and all electrolytes with morning labs.  Raiford Noble, PharmD Clinical Pharmacist 11/24/2019 5:40 PM

## 2019-11-25 DIAGNOSIS — Z515 Encounter for palliative care: Secondary | ICD-10-CM | POA: Diagnosis not present

## 2019-11-25 DIAGNOSIS — K7290 Hepatic failure, unspecified without coma: Secondary | ICD-10-CM | POA: Diagnosis not present

## 2019-11-25 DIAGNOSIS — Z66 Do not resuscitate: Secondary | ICD-10-CM | POA: Diagnosis not present

## 2019-11-25 DIAGNOSIS — K7031 Alcoholic cirrhosis of liver with ascites: Secondary | ICD-10-CM | POA: Diagnosis not present

## 2019-11-25 DIAGNOSIS — Z7189 Other specified counseling: Secondary | ICD-10-CM | POA: Diagnosis not present

## 2019-11-25 LAB — CBC WITH DIFFERENTIAL/PLATELET
Abs Immature Granulocytes: 0.24 10*3/uL — ABNORMAL HIGH (ref 0.00–0.07)
Basophils Absolute: 0.1 10*3/uL (ref 0.0–0.1)
Basophils Relative: 1 %
Eosinophils Absolute: 0.6 10*3/uL — ABNORMAL HIGH (ref 0.0–0.5)
Eosinophils Relative: 6 %
HCT: 25.6 % — ABNORMAL LOW (ref 36.0–46.0)
Hemoglobin: 8.6 g/dL — ABNORMAL LOW (ref 12.0–15.0)
Immature Granulocytes: 2 %
Lymphocytes Relative: 16 %
Lymphs Abs: 1.6 10*3/uL (ref 0.7–4.0)
MCH: 37.2 pg — ABNORMAL HIGH (ref 26.0–34.0)
MCHC: 33.6 g/dL (ref 30.0–36.0)
MCV: 110.8 fL — ABNORMAL HIGH (ref 80.0–100.0)
Monocytes Absolute: 0.9 10*3/uL (ref 0.1–1.0)
Monocytes Relative: 10 %
Neutro Abs: 6.5 10*3/uL (ref 1.7–7.7)
Neutrophils Relative %: 65 %
Platelets: 188 10*3/uL (ref 150–400)
RBC: 2.31 MIL/uL — ABNORMAL LOW (ref 3.87–5.11)
RDW: 18.2 % — ABNORMAL HIGH (ref 11.5–15.5)
Smear Review: NORMAL
WBC: 9.9 10*3/uL (ref 4.0–10.5)
nRBC: 0.3 % — ABNORMAL HIGH (ref 0.0–0.2)

## 2019-11-25 LAB — COMPREHENSIVE METABOLIC PANEL
ALT: 73 U/L — ABNORMAL HIGH (ref 0–44)
AST: 296 U/L — ABNORMAL HIGH (ref 15–41)
Albumin: 2.2 g/dL — ABNORMAL LOW (ref 3.5–5.0)
Alkaline Phosphatase: 443 U/L — ABNORMAL HIGH (ref 38–126)
Anion gap: 10 (ref 5–15)
BUN: <5 mg/dL — ABNORMAL LOW (ref 6–20)
CO2: 29 mmol/L (ref 22–32)
Calcium: 7.9 mg/dL — ABNORMAL LOW (ref 8.9–10.3)
Chloride: 92 mmol/L — ABNORMAL LOW (ref 98–111)
Creatinine, Ser: UNDETERMINED mg/dL (ref 0.44–1.00)
Glucose, Bld: 103 mg/dL — ABNORMAL HIGH (ref 70–99)
Potassium: 3.4 mmol/L — ABNORMAL LOW (ref 3.5–5.1)
Sodium: 131 mmol/L — ABNORMAL LOW (ref 135–145)
Total Bilirubin: 24 mg/dL (ref 0.3–1.2)
Total Protein: 5.7 g/dL — ABNORMAL LOW (ref 6.5–8.1)

## 2019-11-25 LAB — GLUCOSE, CAPILLARY
Glucose-Capillary: 102 mg/dL — ABNORMAL HIGH (ref 70–99)
Glucose-Capillary: 110 mg/dL — ABNORMAL HIGH (ref 70–99)
Glucose-Capillary: 110 mg/dL — ABNORMAL HIGH (ref 70–99)
Glucose-Capillary: 92 mg/dL (ref 70–99)
Glucose-Capillary: 95 mg/dL (ref 70–99)
Glucose-Capillary: 99 mg/dL (ref 70–99)

## 2019-11-25 LAB — MAGNESIUM
Magnesium: 2.3 mg/dL (ref 1.7–2.4)
Magnesium: 2.3 mg/dL (ref 1.7–2.4)

## 2019-11-25 LAB — SODIUM
Sodium: 130 mmol/L — ABNORMAL LOW (ref 135–145)
Sodium: 132 mmol/L — ABNORMAL LOW (ref 135–145)
Sodium: 131 mmol/L — ABNORMAL LOW (ref 135–145)
Sodium: 132 mmol/L — ABNORMAL LOW (ref 135–145)
Sodium: 132 mmol/L — ABNORMAL LOW (ref 135–145)

## 2019-11-25 LAB — POTASSIUM: Potassium: 3.4 mmol/L — ABNORMAL LOW (ref 3.5–5.1)

## 2019-11-25 LAB — PHOSPHORUS
Phosphorus: UNDETERMINED mg/dL (ref 2.5–4.6)
Phosphorus: UNDETERMINED mg/dL (ref 2.5–4.6)

## 2019-11-25 MED ORDER — DEXTROSE 10 % IV SOLN: INTRAVENOUS | Status: DC

## 2019-11-25 MED ORDER — POTASSIUM CHLORIDE 10 MEQ/100ML IV SOLN
10.0000 meq | INTRAVENOUS | Status: AC
  Administered 2019-11-25 (×2): 10 meq via INTRAVENOUS
  Filled 2019-11-25 (×2): qty 100

## 2019-11-25 MED ORDER — POTASSIUM CHLORIDE 20 MEQ PO PACK
40.0000 meq | PACK | Freq: Once | ORAL | Status: AC
  Administered 2019-11-25: 40 meq
  Filled 2019-11-25: qty 2

## 2019-11-25 NOTE — Progress Notes (Signed)
CRITICAL CARE NOTE 39 yo female with a significant history of alcohol hepatitisand seizurespresenting to the ED with LUQ pain and reports of a seizure at home admitted to the ICU for monitoring.  SEPSIS HAS BEEN RULED OUT   Significant Diagnostic Tests:  11-22-19 CT head > negative 11-22-2019 CT abdomen>enlarged fatty liver, colonic diverticulosis, aortic atherosclerosis 10/19 severe DT's 10/20 worsening liver failure Micro Data:  10/18 BC >> 10/18 COVID 19 >> negative 10/18 Influenza A/B >> negative 10/18 UA >>   CC  Follow up liver failure  SUBJECTIVE Remains critically ill Prognosis is poor Liver failure worsening Sodium better today  BP 118/82   Pulse 100   Temp 97.7 F (36.5 C)   Resp (!) 22   Ht 5\' 3"  (1.6 m)   Wt 91.1 kg   SpO2 96%   BMI 35.58 kg/m    I/O last 3 completed shifts: In: 2827.6 [P.O.:120; I.V.:1775.1; IV Piggyback:932.5] Out: 2100 [Urine:1550; Stool:550] No intake/output data recorded.  SpO2: 96 % O2 Flow Rate (L/min): 1 L/min  Estimated body mass index is 35.58 kg/m as calculated from the following:   Height as of this encounter: 5\' 3"  (1.6 m).   Weight as of this encounter: 91.1 kg.   REVIEW OF SYSTEMS  PATIENT IS UNABLE TO PROVIDE COMPLETE REVIEW OF SYSTEM S DUE TO SEVERE CRITICAL ILLNESS AND ENCEPHALOPATHY     PHYSICAL EXAMINATION:  GENERAL:critically ill appearing, +resp distress HEAD: Normocephalic, atraumatic.  EYES:  +icterus.  MOUTH: Moist mucosal membrane. NECK: Supple. No thyromegaly. No nodules. No JVD.  PULMONARY: +rhonchi,  CARDIOVASCULAR: S1 and S2. Regular rate and rhythm. No murmurs, rubs, or gallops.  GASTROINTESTINAL: Soft, nontender, -distended. Positive bowel sounds.  MUSCULOSKELETAL: No swelling, clubbing, or edema.  NEUROLOGIC: lethargic,agitated SKIN:intact,warm,dry    MEDICATIONS: I have reviewed all medications and confirmed regimen as documented   CULTURE RESULTS   Recent Results (from  the past 240 hour(s))  Blood culture (routine x 2)     Status: None (Preliminary result)   Collection Time: 2019/11/22  3:49 PM   Specimen: BLOOD  Result Value Ref Range Status   Specimen Description BLOOD BLOOD RIGHT HAND  Final   Special Requests   Final    BOTTLES DRAWN AEROBIC AND ANAEROBIC Blood Culture adequate volume   Culture   Final    NO GROWTH 4 DAYS Performed at [facility], [address] [place], [address] [place]    Report Status PENDING  Incomplete  Blood culture (routine x 2)     Status: None (Preliminary result)   Collection Time: 2019/11/22  4:40 PM   Specimen: BLOOD  Result Value Ref Range Status   Specimen Description BLOOD LEFT SHOULDER  Final   Special Requests   Final    BOTTLES DRAWN AEROBIC AND ANAEROBIC Blood Culture adequate volume   Culture   Final    NO GROWTH 4 DAYS Performed at [facility], [address], [place], [address] [place]    Report Status PENDING  Incomplete  Respiratory Panel by RT PCR (Flu A&B, Covid) - Nasopharyngeal Swab     Status: None   Collection Time: 2019/11/22  8:45 PM   Specimen: Nasopharyngeal Swab  Result Value Ref Range Status   SARS Coronavirus 2 by RT PCR NEGATIVE NEGATIVE Final    Comment: (NOTE) SARS-CoV-2 target nucleic acids are NOT DETECTED.  The SARS-CoV-2 RNA is generally detectable in upper respiratoy specimens during the acute phase of infection. The lowest concentration of SARS-CoV-2  viral copies this assay can detect is 131 copies/mL. A negative result does not preclude SARS-Cov-2 infection and should not be used as the sole basis for treatment or other patient management decisions. A negative result may occur with  improper specimen collection/handling, submission of specimen other than nasopharyngeal swab, presence of viral mutation(s) within the areas targeted by this assay, and inadequate number of viral copies (<131 copies/mL). A negative result must be combined with  clinical observations, patient history, and epidemiological information. The expected result is Negative.  Fact Sheet for Patients:  https://www.moore.com/  Fact Sheet for Healthcare Providers:  https://www.young.biz/  This test is no t yet approved or cleared by the [place] FDA and  has been authorized for detection and/or diagnosis of SARS-CoV-2 by FDA under an Emergency Use Authorization (EUA). This EUA will remain  in effect (meaning this test can be used) for the duration of the COVID-19 declaration under Section 564(b)(1) of the Act, 21 U.S.C. section 360bbb-3(b)(1), unless the authorization is terminated or revoked sooner.     Influenza A by PCR NEGATIVE NEGATIVE Final   Influenza B by PCR NEGATIVE NEGATIVE Final    Comment: (NOTE) The Xpert Xpress SARS-CoV-2/FLU/RSV assay is intended as an aid in  the diagnosis of influenza from Nasopharyngeal swab specimens and  should not be used as a sole basis for treatment. Nasal washings and  aspirates are unacceptable for Xpert Xpress SARS-CoV-2/FLU/RSV  testing.  Fact Sheet for Patients: https://www.moore.com/  Fact Sheet for Healthcare Providers: https://www.young.biz/  This test is not yet approved or cleared by the [place] FDA and  has been authorized for detection and/or diagnosis of SARS-CoV-2 by  FDA under an Emergency Use Authorization (EUA). This EUA will remain  in effect (meaning this test can be used) for the duration of the  Covid-19 declaration under Section 564(b)(1) of the Act, 21  U.S.C. section 360bbb-3(b)(1), unless the authorization is  terminated or revoked. Performed at [facility], [address] [place], [place] [address]   MRSA PCR Screening     Status: None   Collection Time: 11/22/19 12:40 AM   Specimen: Nasal Mucosa; Nasopharyngeal  Result Value Ref Range Status   MRSA by PCR NEGATIVE NEGATIVE  Final    Comment:        The GeneXpert MRSA Assay (FDA approved for NASAL specimens only), is one component of a comprehensive MRSA colonization surveillance program. It is not intended to diagnose MRSA infection nor to guide or monitor treatment for MRSA infections. Performed at [facility], [address] [place], [place] [address]   Urine Culture     Status: Abnormal   Collection Time: 11/22/19  1:50 AM   Specimen: Urine, Random  Result Value Ref Range Status   Specimen Description   Final    URINE, RANDOM Performed at [facility], [address], [place], [place] [address]    Special Requests   Final    NONE Performed at [facility], [address] [place], [place] [address]    Culture MULTIPLE SPECIES PRESENT, SUGGEST RECOLLECTION (A)  Final   Report Status 11/23/2019 FINAL  Final      CMP Latest Ref Rng & Units 11/25/2019 11/25/2019 11/24/2019  Glucose 70 - 99 mg/dL 672(C) - -  BUN 6 - 20 mg/dL <9(O) - -  Creatinine 7.09 - 1.00 mg/dL UNABLE TO REPORT DUE TO ICTERUS - -  Sodium 135 - 145 mmol/L 131(L) 130(L) 130(L)  Potassium 3.5 -  5.1 mmol/L 3.4(L) 3.4(L) -  Chloride 98 - 111 mmol/L 92(L) - -  CO2 22 - 32 mmol/L 29 - -  Calcium 8.9 - 10.3 mg/dL 7.9(L) - -  Total Protein 6.5 - 8.1 g/dL 3.3(H) - -  Total Bilirubin 0.3 - 1.2 mg/dL 24.0(HH) - -  Alkaline Phos 38 - 126 U/L 443(H) - -  AST 15 - 41 U/L 296(H) - -  ALT 0 - 44 U/L 73(H) - -      ASSESSMENT AND PLAN SYNOPSIS  SEVERE HEPATIC ENCEPHALOPATHY DUE TO PROGRESSIVE LIVER FAILURE DUE TO ETOH ABUSE WITH SEVERE HYPONATREMIA AND HYPOKALEMIA WITH DT's  Obesity Will certainly impact respiratory mechanics  Acute toxic metabolic encephalopathy from liver cirrhosis Lactulose as tolerated  CARDIAC ICU monitoring  ID -continue IV abx as prescibed -follow up cultures  DIET--> as tolerated Constipation protocol as indicated  ENDO - will use ICU hypoglycemic\Hyperglycemia  protocol if indicated    ELECTROLYTES -follow labs as needed -replace as needed -pharmacy consultation and following   DVT/GI PRX ordered and assessed TRANSFUSIONS AS NEEDED MONITOR FSBS I Assessed the need for Labs I Assessed the need for Foley I Assessed the need for Central Venous Line Family Discussion when available I Assessed the need for Mobilization I made an Assessment of medications to be adjusted accordingly Safety Risk assessment completed  CASE DISCUSSED IN MULTIDISCIPLINARY ROUNDS WITH ICU TEAM    Critical Care Time devoted to patient care services described in this note is 75 minutes.   Overall, patient is critically ill, prognosis is guarded.  Patient with Multiorgan failure and at high risk for cardiac arrest and death.   Patient with progressive liver failure and very poor chance of survival, patient with irreversible liver failure Palliative care team consulted    Earlisha Sharples Santiago Glad, M.D.  [facility]  Medical Director [facility] Medical Director [facility] Cardio-Pulmonary Department

## 2019-11-25 NOTE — Progress Notes (Signed)
Daily Progress Note   Patient Name: Ashlee Hawkins       Date: 11/25/2019 DOB: 06/05/1980  Age: 39 y.o. MRN#: [id number] Attending Physician: Erin Fulling, MD Primary Care Physician: [facility] Admit Date: 11/13/2019  Reason for Consultation/Follow-up: Establishing goals of care  Subjective: Opens eyes to voice, does not answer questions or follow commands  Length of Stay: 4  Current Medications: Scheduled Meds:  . Chlorhexidine Gluconate Cloth  6 each Topical Daily  . folic acid  1 mg Oral Daily  . heparin  5,000 Units Subcutaneous Q8H  . lactulose  20 g Oral BID  . LORazepam  0-4 mg Intravenous Q12H   Or  . LORazepam  0-4 mg Oral Q12H  . mouth rinse  15 mL Mouth Rinse BID  . oxcarbazepine  600 mg Oral BID  . pantoprazole (PROTONIX) IV  40 mg Intravenous Q24H  . rifaximin  550 mg Oral BID  . sodium chloride flush  10-40 mL Intracatheter Q12H  . spironolactone  25 mg Oral Daily  . thiamine  100 mg Oral Daily   Or  . thiamine  100 mg Intravenous Daily    Continuous Infusions: . dextrose 30 mL/hr at 11/25/19 0950  . piperacillin-tazobactam (ZOSYN)  IV 3.375 g (11/25/19 0758)    PRN Meds: docusate sodium, ondansetron (ZOFRAN) IV, polyethylene glycol, sodium chloride flush, white petrolatum  Physical Exam Constitutional:      Comments: Lethargic, disoriented  Pulmonary:     Effort: Pulmonary effort is normal. No respiratory distress.  Skin:    General: Skin is warm and dry.     Coloration: Skin is jaundiced.             Vital Signs: BP 118/82   Pulse 100   Temp 97.7 F (36.5 C) (Oral)   Resp (!) 22   Ht 5\' 3"  (1.6 m)   Wt 91.1 kg   SpO2 96%   BMI 35.58 kg/m  SpO2: SpO2: 96 % O2 Device: O2 Device: Nasal Cannula O2 Flow Rate: O2 Flow Rate (L/min): 1  L/min  Intake/output summary:   Intake/Output Summary (Last 24 hours) at 11/25/2019 1002 Last data filed at 11/24/2019 1900 Gross per 24 hour  Intake 1513.97 ml  Output 900 ml  Net 613.97 ml   LBM: Last BM Date: 11/24/19 Baseline Weight: Weight: 82.1 kg Most recent weight: Weight: 91.1 kg       Palliative Assessment/Data: PPS 10%    Flowsheet Rows     Most Recent Value  Intake Tab  Referral Department Critical care  Unit at Time of Referral ICU  Palliative Care Primary Diagnosis Other (Comment)  [GI]  Date Notified 11/23/19  Palliative Care Type New Palliative care  Reason for referral Clarify Goals of Care  Date of Admission 11/22/2019  Date first seen by Palliative Care 11/23/19  # of days Palliative referral response time 0 Day(s)  # of days IP prior to Palliative referral 2  Clinical Assessment  Palliative Performance Scale Score 20%  Psychosocial & Spiritual Assessment  Palliative Care Outcomes  Patient/Family meeting held? Yes  Who was at the meeting? spouse  Palliative Care Outcomes Clarified goals of care  Patient Active Problem List   Diagnosis Date Noted  . Goals of care, counseling/discussion   . Palliative care by specialist   . Alcoholic cirrhosis of liver with ascites (HCC)   . Hepatic encephalopathy (HCC)   . Hyponatremia with decreased serum osmolality 11/27/2019  . Alcoholic hepatitis 10/12/2019  . Hyponatremia 10/12/2019    Palliative Care Assessment & Plan   HPI: 39 y.o.femalewith past medical history of ETOH abuse, seizures, and alcoholic hepatitisadmitted on 67/67/2094BSJG LUQ pain and seizure at home.CT of abdomen revealed enlarged fatty liver. Found to have severe hyponatremia. She is at high risk for aspiration and intubation. PMT consulted to discuss GOC.  Assessment: Discussed situation with RN and Dr. Belia Heman - they have both updated John today. RN shares that [employer] patient last night and felt like she was doing  better than he had been led to believe because she was "awake". Medical team updated Jonny Ruiz that patient does have periods of being awake or more interactive but overall her liver failure is worsening and she remains critically ill. Also, patient's family members (one has medical background) visited patient yesterday and also shared with Jonny Ruiz that patient was not doing well.   I called and spoke with Jonny Ruiz He reviews the above conversations with me and shares that he understands the poor prognosis. We reviewed her medical condition.  Encouraged John to consider DNR/DNI status understanding evidenced based poor outcomes in similar hospitalized patients, as the cause of the arrest is likely associated with chronic/terminal disease rather than a reversible acute cardio-pulmonary event. John agrees that DNR is appropriate.  John asks about comfort measures - we discuss that changing her code status does not mean that she is not receiving full scope medical care. He asks about comfort measures only and what this would look like for patient. We reviewed this.  Jonny Ruiz is most concerned about patient's 33 yo son being able to visit. I discussed this with RN. Confirmed that because he is under 18 he is not allowed to visit. Discussed that if patient is shifted to full comfort care we could discuss an exception with leadership. Jonny Ruiz is upset by visitor restrictions.   John agreed to plan of changing code status but continuing all other interventions over weekend. If patient declines or is not improving we would revisit full comfort care early next week. We discussed hospice care. He is interested in hospice care if this is appropriate.   Jonny Ruiz does share that he has Parkinsons and has difficulty ambulating and this is the reason he has not visited.   Recommendations/Plan:  Code status changed to DNR  Ongoing conversations about shift to comfort measures and hospice care - Jonny Ruiz would like the weekend to consider  and see how patient does - PMT will f/u early next week  Code Status:  DNR  Prognosis:   Unable to determine - at risk for acute decompensation, currently with no PO intake which significantly shortens prognosis  Discharge Planning:  To Be Determined - spouse considering hospice  Care plan was discussed with Dr Belia Heman and RN, patient's spouse  Thank you for allowing the Palliative Medicine Team to assist in the care of this patient.   Total Time 45 minutes Prolonged Time Billed  no       Greater than 50%  of this time was spent counseling and coordinating care related to the above assessment and plan.  Gerlean Ren, DNP, AGNP-C Palliative Medicine Team Team Phone # [phone]  Pager [phone]

## 2019-11-25 NOTE — Progress Notes (Signed)
39 y/o F w/ PMH of alcohol abuse who was admitted severe hyponatremia,  alcoholic hepatitis & hepatic encephalopathy. Hospitalist service will take over care tomorrow.   This is a non-billable note.

## 2019-11-25 NOTE — Consult Note (Signed)
PHARMACY CONSULT NOTE - FOLLOW UP  Pharmacy Consult for Electrolyte Monitoring and Replacement   Recent Labs: Potassium (mmol/L)  Date Value  11/25/2019 3.4 (L)  11/25/2019 3.4 (L)   Magnesium (mg/dL)  Date Value  09/40/7680 2.3  11/25/2019 2.3   Calcium (mg/dL)  Date Value  88/12/313 7.9 (L)   Albumin (g/dL)  Date Value  94/58/5929 2.2 (L)   Phosphorus (mg/dL)  Date Value  24/46/2863 UNABLE TO REPORT DUE TO ICTERUS  11/25/2019 UNABLE TO REPORT DUE TO ICTERUS   Sodium (mmol/L)  Date Value  11/25/2019 131 (L)     Assessment: 39yo female with PMH bipolar disorder, HTN, alcohol abuse with alcoholic hepatitis who came to ED c/o seizure occurring ~0900 10/18. Patient reports drinking her usual amount including 5 shots this morning. Patient speech noted to be slow and deliberate. Skin and eyes noted to have yellow tone. Pharmacy has been consulted for monitoring electrolytes.   MIVF D10 at 30 ml/hr   Goal of Therapy:  WNL  Plan:  Potassium with improvement. Potassium taken out of MIVF. Received 10 mEq IV x 2 runs this morning. Will order an additional 40 mEq PO for this afternoon. Recheck electrolytes with morning labs.  Pricilla Riffle, PharmD Clinical Pharmacist 11/25/2019 12:36 PM

## 2019-11-25 NOTE — Progress Notes (Signed)
[facility]  ROUNDING NOTE   Subjective:  Ms Hogston is a 39 y.o. female with medical problems of alcohol abuse, who was admitted to [facility] on 11/24/19 for evaluation of seizures.  Patient resting in bed, with eyes closed,attempts to open her to call, appears very lethargic.  Objective:  Vital signs in last 24 hours:  Temp:  [97.5 F (36.4 C)-98.6 F (37 C)] 97.5 F (36.4 C) (10/22 1200)  Weight change:  Filed Weights   11/22/19 0441 11/23/19 0347 11/24/19 0351  Weight: 90.1 kg 89.7 kg 91.1 kg    Intake/Output: I/O last 3 completed shifts: In: 2827.6 [P.O.:120; I.V.:1775.1; IV Piggyback:932.5] Out: 2100 [Urine:1550; Stool:550]   Intake/Output this shift:  Total I/O In: 1390.2 [P.O.:120; I.V.:1162; IV Piggyback:108.3] Out: 1400 [Urine:600; Stool:800]  Physical Exam: General: Lethargic  Head: Moist oral mucosal membranes  Lungs:  Normal and symmetrical effort,Lungs clear  Heart: S1S2 ,no rubs or gallops,Regular,HR in 90's  Abdomen:  Soft, nontender, non distended  Extremities:  No  peripheral edema  Neurologic:  sleeping,arousable, but appears very drowsy  Skin: jaundiced    Basic Metabolic Panel: Recent Labs  Lab 11-24-19 1527 11/24/2019 2036 11/22/19 0048 11/22/19 0048 11/22/19 0428 11/22/19 1024 11/23/19 0341 11/23/19 1410 11/23/19 1747 11/23/19 2128 11/24/19 0431 11/24/19 0610 11/24/19 1433 11/24/19 1650 11/24/19 2055 11/25/19 0030 11/25/19 0620 11/25/19 0957  NA   < > 116* 117*   < > 116*   < > 121*   < > 124*   < > 128*   < > 128*  --  130* 131*  130* 132* 131*  K   < > 2.0* 2.1*   < > 2.2*   < > 2.8*  --  3.2*  --  2.9*  --   --  3.4*  --  3.4*  3.4*  --   --   CL   < >  --  65*  --  66*  --  74*  --   --   --  84*  --   --   --   --  92*  --   --   CO2   < >  --  28  --  34*  --  35*  --   --   --  33*  --   --   --   --  29  --   --   GLUCOSE   < >  --  119*  --  123*  --  91  --   --   --  93  --   --   --   --  103*  --   --    BUN   < >  --  <5*  --  <5*  --  <5*  --   --   --  <5*  --   --   --   --  <5*  --   --   CREATININE   < >  --  <0.30*  --  0.34*  --  <0.30*  --   --   --  <0.30*  --   --   --   --  UNABLE TO REPORT DUE TO ICTERUS  --   --   CALCIUM   < >  --  7.6*   < > 7.5*  --  7.1*  --   --   --  7.6*  --   --   --   --  7.9*  --   --   MG  --  1.5* 2.9*  --   --   --  2.4  --   --   --  2.3  --   --   --   --  2.3  2.3  --   --   PHOS  --   --   --   --   --   --  UNABLE TO REPORT DUE TO ICTERUS  --   --   --  UNABLE TO REPORT DUE TO ICTERUS  --   --   --   --  UNABLE TO REPORT DUE TO ICTERUS  UNABLE TO REPORT DUE TO ICTERUS  --   --    < > = values in this interval not displayed.    Liver Function Tests: Recent Labs  Lab 11/22/19 0048 11/23/19 0341 11/24/19 0431 11/24/19 0956 11/25/19 0030  AST 303* 317* 328* 298* 296*  ALT 65* 62* 70* 64* 73*  ALKPHOS 432* 357* 418* 383* 443*  BILITOT 20.1* 21.5* 23.7* 21.6* 24.0*  PROT 6.2* 5.6* 5.7* 5.3* 5.7*  ALBUMIN 2.5* 2.3* 2.3* 2.1* 2.2*   Recent Labs  Lab 11/24/19 0610  LIPASE 80*  AMYLASE 131*   Recent Labs  Lab Dec 08, 2019 1334 11/24/19 0611  AMMONIA 106* 68*    CBC: Recent Labs  Lab 12-08-19 1333 11/22/19 0048 11/23/19 0341 11/24/19 0431 11/25/19 0030  WBC 11.9* 10.0 8.7 8.5 9.9  NEUTROABS  --   --  6.7 6.1 6.5  HGB 9.6* 9.3* 8.4* 8.1* 8.6*  HCT 26.3* 25.9* 23.2* 23.5* 25.6*  MCV 101.5* 103.2* 104.0* 108.3* 110.8*  PLT 158 142* 146* 173 188    Cardiac Enzymes: No results for input(s): CKTOTAL, CKMB, CKMBINDEX, TROPONINI in the last 168 hours.  BNP: Invalid input(s): POCBNP  CBG: Recent Labs  Lab 11/24/19 1926 11/24/19 2341 11/25/19 0345 11/25/19 0724 11/25/19 1105  GLUCAP 103* 107* 95 99 102*    Microbiology: Results for orders placed or performed during the hospital encounter of 2019-12-08  Blood culture (routine x 2)     Status: None (Preliminary result)   Collection Time: 12-08-2019  3:49 PM   Specimen:  BLOOD  Result Value Ref Range Status   Specimen Description BLOOD BLOOD RIGHT HAND  Final   Special Requests   Final    BOTTLES DRAWN AEROBIC AND ANAEROBIC Blood Culture adequate volume   Culture   Final    NO GROWTH 4 DAYS Performed at [facility], [address], [place], [place] [address]    Report Status PENDING  Incomplete  Blood culture (routine x 2)     Status: None (Preliminary result)   Collection Time: 08-Dec-2019  4:40 PM   Specimen: BLOOD  Result Value Ref Range Status   Specimen Description BLOOD LEFT SHOULDER  Final   Special Requests   Final    BOTTLES DRAWN AEROBIC AND ANAEROBIC Blood Culture adequate volume   Culture   Final    NO GROWTH 4 DAYS Performed at [facility], [address], [place], [place] [address]    Report Status PENDING  Incomplete  Respiratory Panel by RT PCR (Flu A&B, Covid) - Nasopharyngeal Swab     Status: None   Collection Time: 12-08-19  8:45 PM   Specimen: Nasopharyngeal Swab  Result Value Ref Range Status   SARS Coronavirus 2 by RT PCR NEGATIVE NEGATIVE Final    Comment: (NOTE) SARS-CoV-2 target nucleic acids are  NOT DETECTED.  The SARS-CoV-2 RNA is generally detectable in upper respiratoy specimens during the acute phase of infection. The lowest concentration of SARS-CoV-2 viral copies this assay can detect is 131 copies/mL. A negative result does not preclude SARS-Cov-2 infection and should not be used as the sole basis for treatment or other patient management decisions. A negative result may occur with  improper specimen collection/handling, submission of specimen other than nasopharyngeal swab, presence of viral mutation(s) within the areas targeted by this assay, and inadequate number of viral copies (<131 copies/mL). A negative result must be combined with clinical observations, patient history, and epidemiological information. The expected result is Negative.  Fact Sheet for Patients:   https://www.moore.com/  Fact Sheet for Healthcare Providers:  https://www.young.biz/  This test is no t yet approved or cleared by the [place] FDA and  has been authorized for detection and/or diagnosis of SARS-CoV-2 by FDA under an Emergency Use Authorization (EUA). This EUA will remain  in effect (meaning this test can be used) for the duration of the COVID-19 declaration under Section 564(b)(1) of the Act, 21 U.S.C. section 360bbb-3(b)(1), unless the authorization is terminated or revoked sooner.     Influenza A by PCR NEGATIVE NEGATIVE Final   Influenza B by PCR NEGATIVE NEGATIVE Final    Comment: (NOTE) The Xpert Xpress SARS-CoV-2/FLU/RSV assay is intended as an aid in  the diagnosis of influenza from Nasopharyngeal swab specimens and  should not be used as a sole basis for treatment. Nasal washings and  aspirates are unacceptable for Xpert Xpress SARS-CoV-2/FLU/RSV  testing.  Fact Sheet for Patients: https://www.moore.com/  Fact Sheet for Healthcare Providers: https://www.young.biz/  This test is not yet approved or cleared by the [place] FDA and  has been authorized for detection and/or diagnosis of SARS-CoV-2 by  FDA under an Emergency Use Authorization (EUA). This EUA will remain  in effect (meaning this test can be used) for the duration of the  Covid-19 declaration under Section 564(b)(1) of the Act, 21  U.S.C. section 360bbb-3(b)(1), unless the authorization is  terminated or revoked. Performed at [facility], [address] [place], [place] [address]   MRSA PCR Screening     Status: None   Collection Time: 11/22/19 12:40 AM   Specimen: Nasal Mucosa; Nasopharyngeal  Result Value Ref Range Status   MRSA by PCR NEGATIVE NEGATIVE Final    Comment:        The GeneXpert MRSA Assay (FDA approved for NASAL specimens only), is one component of a comprehensive MRSA  colonization surveillance program. It is not intended to diagnose MRSA infection nor to guide or monitor treatment for MRSA infections. Performed at [facility], [address] [place], [place] [address]   Urine Culture     Status: Abnormal   Collection Time: 11/22/19  1:50 AM   Specimen: Urine, Random  Result Value Ref Range Status   Specimen Description   Final    URINE, RANDOM Performed at [facility], [address], [place], [place] [address]    Special Requests   Final    NONE Performed at [facility], [address] [place], [place] [address]    Culture MULTIPLE SPECIES PRESENT, SUGGEST RECOLLECTION (A)  Final   Report Status 11/23/2019 FINAL  Final    Coagulation Studies: No results for input(s): LABPROT, INR in the last 72 hours.  Urinalysis: No results for input(s): COLORURINE, LABSPEC, PHURINE, GLUCOSEU, HGBUR, BILIRUBINUR, KETONESUR, PROTEINUR, UROBILINOGEN, NITRITE, LEUKOCYTESUR in the  last 72 hours.  Invalid input(s): APPERANCEUR    Imaging: No results found.   Medications:   . dextrose 30 mL/hr at 11/25/19 0950   . Chlorhexidine Gluconate Cloth  6 each Topical Daily  . folic acid  1 mg Oral Daily  . heparin  5,000 Units Subcutaneous Q8H  . lactulose  20 g Oral BID  . LORazepam  0-4 mg Intravenous Q12H   Or  . LORazepam  0-4 mg Oral Q12H  . mouth rinse  15 mL Mouth Rinse BID  . oxcarbazepine  600 mg Oral BID  . pantoprazole (PROTONIX) IV  40 mg Intravenous Q24H  . potassium chloride  40 mEq Per Tube Once  . rifaximin  550 mg Oral BID  . sodium chloride flush  10-40 mL Intracatheter Q12H  . spironolactone  25 mg Oral Daily  . thiamine  100 mg Oral Daily   Or  . thiamine  100 mg Intravenous Daily   docusate sodium, ondansetron (ZOFRAN) IV, polyethylene glycol, sodium chloride flush, white petrolatum  Assessment/ Plan:  Ms. Kathryne GinMelissa N Craigo is a 39 y.o.  female with medical problems of alcohol abuse, who [facility]  [facility] evaluation of seizures.  # Hyponatremia   -Sodium  Improving progressively,132 today - Continue monitoring - Discontinue IV Fluids  # Hypokalemia -Potassium improved to 3.4 - IV  KCL    # ETOH Intoxication # Alcohol Hepatitis  -Continue CIWAA protocol  -Seizure precautions  -Management per ICU team  Nephrology signing off at this point.Please call us with any questions.   LOS: 4 Kaiyan Luczak 10/22/20211:21 PM

## 2019-11-26 DIAGNOSIS — K7290 Hepatic failure, unspecified without coma: Secondary | ICD-10-CM | POA: Diagnosis not present

## 2019-11-26 DIAGNOSIS — R569 Unspecified convulsions: Secondary | ICD-10-CM

## 2019-11-26 DIAGNOSIS — K7031 Alcoholic cirrhosis of liver with ascites: Secondary | ICD-10-CM | POA: Diagnosis not present

## 2019-11-26 DIAGNOSIS — E871 Hypo-osmolality and hyponatremia: Secondary | ICD-10-CM | POA: Diagnosis not present

## 2019-11-26 LAB — GLUCOSE, CAPILLARY
Glucose-Capillary: 103 mg/dL — ABNORMAL HIGH (ref 70–99)
Glucose-Capillary: 104 mg/dL — ABNORMAL HIGH (ref 70–99)
Glucose-Capillary: 116 mg/dL — ABNORMAL HIGH (ref 70–99)
Glucose-Capillary: 109 mg/dL — ABNORMAL HIGH (ref 70–99)
Glucose-Capillary: 91 mg/dL (ref 70–99)

## 2019-11-26 LAB — CBC WITH DIFFERENTIAL/PLATELET
Abs Immature Granulocytes: 0.73 10*3/uL — ABNORMAL HIGH (ref 0.00–0.07)
Basophils Absolute: 0.1 10*3/uL (ref 0.0–0.1)
Basophils Relative: 1 %
Eosinophils Absolute: 0.6 10*3/uL — ABNORMAL HIGH (ref 0.0–0.5)
Eosinophils Relative: 5 %
HCT: 25.2 % — ABNORMAL LOW (ref 36.0–46.0)
Hemoglobin: 8.4 g/dL — ABNORMAL LOW (ref 12.0–15.0)
Immature Granulocytes: 6 %
Lymphocytes Relative: 12 %
Lymphs Abs: 1.5 10*3/uL (ref 0.7–4.0)
MCH: 36.8 pg — ABNORMAL HIGH (ref 26.0–34.0)
MCHC: 33.3 g/dL (ref 30.0–36.0)
MCV: 110.5 fL — ABNORMAL HIGH (ref 80.0–100.0)
Monocytes Absolute: 1.4 10*3/uL — ABNORMAL HIGH (ref 0.1–1.0)
Monocytes Relative: 11 %
Neutro Abs: 7.9 10*3/uL — ABNORMAL HIGH (ref 1.7–7.7)
Neutrophils Relative %: 65 %
Platelets: 148 10*3/uL — ABNORMAL LOW (ref 150–400)
RBC: 2.28 MIL/uL — ABNORMAL LOW (ref 3.87–5.11)
RDW: 19.1 % — ABNORMAL HIGH (ref 11.5–15.5)
Smear Review: NORMAL
WBC: 12.1 10*3/uL — ABNORMAL HIGH (ref 4.0–10.5)
nRBC: 2.9 % — ABNORMAL HIGH (ref 0.0–0.2)

## 2019-11-26 LAB — COMPREHENSIVE METABOLIC PANEL
ALT: 62 U/L — ABNORMAL HIGH (ref 0–44)
AST: 232 U/L — ABNORMAL HIGH (ref 15–41)
Albumin: 2.1 g/dL — ABNORMAL LOW (ref 3.5–5.0)
Alkaline Phosphatase: 426 U/L — ABNORMAL HIGH (ref 38–126)
Anion gap: 9 (ref 5–15)
BUN: <5 mg/dL — ABNORMAL LOW (ref 6–20)
CO2: 28 mmol/L (ref 22–32)
Calcium: 8 mg/dL — ABNORMAL LOW (ref 8.9–10.3)
Chloride: 95 mmol/L — ABNORMAL LOW (ref 98–111)
Creatinine, Ser: UNDETERMINED mg/dL (ref 0.44–1.00)
Glucose, Bld: 112 mg/dL — ABNORMAL HIGH (ref 70–99)
Potassium: 3.4 mmol/L — ABNORMAL LOW (ref 3.5–5.1)
Sodium: 132 mmol/L — ABNORMAL LOW (ref 135–145)
Total Bilirubin: 23.3 mg/dL (ref 0.3–1.2)
Total Protein: 5.5 g/dL — ABNORMAL LOW (ref 6.5–8.1)

## 2019-11-26 LAB — CULTURE, BLOOD (ROUTINE X 2)
Culture: NO GROWTH
Culture: NO GROWTH
Special Requests: ADEQUATE
Special Requests: ADEQUATE

## 2019-11-26 LAB — MAGNESIUM: Magnesium: 2.1 mg/dL (ref 1.7–2.4)

## 2019-11-26 LAB — PHOSPHORUS: Phosphorus: UNDETERMINED mg/dL (ref 2.5–4.6)

## 2019-11-26 MED ORDER — IPRATROPIUM-ALBUTEROL 0.5-2.5 (3) MG/3ML IN SOLN: 3.0000 mL | Freq: Four times a day (QID) | RESPIRATORY_TRACT | Status: DC | PRN

## 2019-11-26 MED ORDER — AMOXICILLIN-POT CLAVULANATE 400-57 MG/5ML PO SUSR
800.0000 mg | Freq: Two times a day (BID) | ORAL | Status: DC
  Administered 2019-11-27 – 2019-11-28 (×4): 800 mg via ORAL
  Filled 2019-11-26 (×7): qty 10

## 2019-11-26 MED ORDER — POTASSIUM CHLORIDE 20 MEQ PO PACK: 40.0000 meq | PACK | ORAL | Status: DC

## 2019-11-26 MED ORDER — LACTULOSE 10 GM/15ML PO SOLN
30.0000 g | Freq: Four times a day (QID) | ORAL | Status: DC
  Administered 2019-11-26 (×3): 30 g via ORAL
  Filled 2019-11-26 (×3): qty 60

## 2019-11-26 MED ORDER — KCL IN DEXTROSE-NACL 20-5-0.9 MEQ/L-%-% IV SOLN
INTRAVENOUS | Status: DC
  Filled 2019-11-26 (×10): qty 1000

## 2019-11-26 NOTE — Progress Notes (Signed)
Received report from [facility] in ICU.  Pt to transfer to 127 for continued medical treatment

## 2019-11-26 NOTE — Consult Note (Signed)
PHARMACY CONSULT NOTE - FOLLOW UP  Pharmacy Consult for Electrolyte Monitoring and Replacement   Recent Labs: Potassium (mmol/L)  Date Value  11/26/2019 3.4 (L)   Magnesium (mg/dL)  Date Value  12/06/1592 2.1   Calcium (mg/dL)  Date Value  58/59/2924 8.0 (L)   Albumin (g/dL)  Date Value  46/28/6381 2.1 (L)   Phosphorus (mg/dL)  Date Value  77/12/6577 UNABLE TO REPORT DUE TO ICTERUS.PMF   Sodium (mmol/L)  Date Value  11/26/2019 132 (L)     Assessment: 39yo female with PMH bipolar disorder, HTN, alcohol abuse with alcoholic hepatitis who came to ED c/o seizure occurring ~0900 10/18. Patient reports drinking her usual amount including 5 shots. Patient speech noted to be slow and deliberate. Skin and eyes noted to have yellow tone. Pharmacy has been consulted for monitoring electrolytes.   MIVF D10 at 30 ml/hr   Goal of Therapy:  WNL  Plan:   Will order an additional 40 mEq PO x 2. Recheck electrolytes with morning labs.  Ronnald Ramp, PharmD, BCPS Clinical Pharmacist 11/26/2019 9:05 AM

## 2019-11-26 NOTE — Progress Notes (Signed)
Pt lying on her side and would hardly look at me when I approached pt to take her lactulose.  Pt refused to open mouth for medication

## 2019-11-26 NOTE — Progress Notes (Addendum)
PROGRESS NOTE    Ashlee Hawkins  ZOX:[id number] DOB: 18-Aug-1980 DOA: 2019/11/26 PCP: [facility]   Chief complaint altered mental status.  Brief Narrative:  Patient is a 39 year old female with a history of alcohol abuse, seizure disorder and alcohol hepatitis who initially admited to ICU on 10/18 with a severe hyponatremia with sodium 108, ammonia 106.  She has been continuously drinking alcohol, last drink was before admission.  She was also having seizures. Since admission to the ICU, sodium level has went up after giving 3% sodium chloride, she was ago also giving lactulose.  She is very confused, mental status has not improved since admission.  Her total bilirubin was 23.3.  Ammonia level still elevated.  Patient has been seen by palliative care, long-term prognosis is very poor.   Assessment & Plan:   Active Problems:   Hyponatremia with decreased serum osmolality   Alcoholic cirrhosis of liver with ascites (HCC)   Hepatic encephalopathy (HCC)   Goals of care, counseling/discussion   Palliative care by specialist   DNR (do not resuscitate)  #1.  Hepatic encephalopathy. Patient still very confused, she opens eyes, cannot have a meaningful conversation.  Her condition is due to hepatic cephalopathy in addition to metabolic diseases such as hyponatremia. Patient has not been able to take any p.o. at this time. Spoke with the nurse, will increase dose of lactulose to 30 g 4 times a day for now, monitor electrolytes. We will also giving IV fluids. Rifaximin is ordered, patient may not be able to take it. I will continue treating her for 24 hours, if her condition does not improve, we can consider hospice.  Transfer patient to medical floor.  #2.  Alcohol liver cirrhosis with ascites. Examination showed large amount of ascites with a severe hepatomegaly. Patient has a very high bilirubin level, still drinking alcohol.  Long-term prognosis is very poor. Continue IV  thiamine.  Also on folic acid.  3.  Severe hyponatremia. Sodium level had improved.  4.  Severe protein calorie malnutrition. Patient currently cannot take any p.o.  Will start supplement once mental status clears.  5.  Anemia of chronic disease. Follow.  6.  Hypokalemia. Added potassium into the IV fluids.  7. Aspiration pneumonia. Patient chest x ray has right low lobe infiltrates at admission, Procalcitonin was elevated. She has completed 3 days of zosyn. Recheck procal tomorrow. Give 2 more days of augmentin.    DVT prophylaxis: Heparin Code Status: DNR Family Communication: Talked with patient husband, mother and father on the phone.  All questions answered.  .   Status is: Inpatient  Remains inpatient appropriate because:Inpatient level of care appropriate due to severity of illness   Dispo: The patient is from: Home              Anticipated d/c is to: ?              Anticipated d/c date is: 3 days              Patient currently is not medically stable to d/c.        I/O last 3 completed shifts: In: 1967.9 [P.O.:120; I.V.:1739.6; IV Piggyback:108.3] Out: 2300 [Urine:800; Stool:1500] Total I/O In: 43.6 [I.V.:43.6] Out: -      Consultants:   Palliative care  Procedures: None  Antimicrobials:None  Subjective: Patient is awake, but totally confused.  Cannot maintain a conversation. Does not seem to have any short of breath or cough. No fever or chills.  Not able to review of systems due to patient mental status.  Objective: Vitals:   11/26/19 0600 11/26/19 0700 11/26/19 0800 11/26/19 0826  BP: 112/76 (!) 109/92 (!) 115/91   Pulse: 99 (!) 101 93 90  Resp: (!) 26 (!) 25 (!) 27 (!) 27  Temp:    97.6 F (36.4 C)  TempSrc:    Axillary  SpO2: 100% 100% 100% 97%  Weight:      Height:        Intake/Output Summary (Last 24 hours) at 11/26/2019 0916 Last data filed at 11/26/2019 0827 Gross per 24 hour  Intake 2011.45 ml  Output 2300 ml  Net  -288.55 ml   Filed Weights   11/23/19 0347 11/24/19 0351 11/26/19 0408  Weight: 89.7 kg 91.1 kg 90.6 kg    Examination:  General exam: Appears chronically ill and totally confused.  Malnourished. Respiratory system: Clear to auscultation. Respiratory effort normal. Cardiovascular system: Regular, no JVD, murmurs, rubs, gallops or clicks. No pedal edema. Gastrointestinal system: Abdomen is distended with large ascites, soft and nontender.  Severe hepatomegaly. normal bowel sounds heard. Central nervous system: confused. No focal neurological deficits. Extremities: Symmetric Skin: No rashes, lesions or ulcers      Data Reviewed: I have personally reviewed following labs and imaging studies  CBC: Recent Labs  Lab 11/22/19 0048 11/23/19 0341 11/24/19 0431 11/25/19 0030 11/26/19 0700  WBC 10.0 8.7 8.5 9.9 12.1*  NEUTROABS  --  6.7 6.1 6.5 7.9*  HGB 9.3* 8.4* 8.1* 8.6* 8.4*  HCT 25.9* 23.2* 23.5* 25.6* 25.2*  MCV 103.2* 104.0* 108.3* 110.8* 110.5*  PLT 142* 146* 173 188 148*   Basic Metabolic Panel: Recent Labs  Lab 11/22/19 0048 11/22/19 0048 11/22/19 0428 11/22/19 1024 11/23/19 0341 11/23/19 1410 11/23/19 1747 11/23/19 2128 11/24/19 0431 11/24/19 0610 11/24/19 1650 11/24/19 2055 11/25/19 0030 11/25/19 0030 11/25/19 0620 11/25/19 0957 11/25/19 1350 11/25/19 1746 11/26/19 0700  NA 117*   < > 116*   < > 121*   < > 124*   < > 128*   < >  --    < > 131*  130*   < > 132* 131* 132* 132* 132*  K 2.1*   < > 2.2*   < > 2.8*  --  3.2*  --  2.9*  --  3.4*  --  3.4*  3.4*  --   --   --   --   --  3.4*  CL 65*   < > 66*  --  74*  --   --   --  84*  --   --   --  92*  --   --   --   --   --  95*  CO2 28   < > 34*  --  35*  --   --   --  33*  --   --   --  29  --   --   --   --   --  28  GLUCOSE 119*   < > 123*  --  91  --   --   --  93  --   --   --  103*  --   --   --   --   --  112*  BUN <5*   < > <5*  --  <5*  --   --   --  <5*  --   --   --  <5*  --   --   --   --    --  <  5*  CREATININE <0.30*   < > 0.34*  --  <0.30*  --   --   --  <0.30*  --   --   --  UNABLE TO REPORT DUE TO ICTERUS  --   --   --   --   --  UNABLE TO REPORT DUE TO ICTERUS.PMF  CALCIUM 7.6*   < > 7.5*  --  7.1*  --   --   --  7.6*  --   --   --  7.9*  --   --   --   --   --  8.0*  MG 2.9*  --   --   --  2.4  --   --   --  2.3  --   --   --  2.3  2.3  --   --   --   --   --  2.1  PHOS  --   --   --   --  UNABLE TO REPORT DUE TO ICTERUS  --   --   --  UNABLE TO REPORT DUE TO ICTERUS  --   --   --  UNABLE TO REPORT DUE TO ICTERUS  UNABLE TO REPORT DUE TO ICTERUS  --   --   --   --   --  UNABLE TO REPORT DUE TO ICTERUS.PMF   < > = values in this interval not displayed.   GFR: CrCl cannot be calculated (This lab value cannot be used to calculate CrCl because it is not a number: UNABLE TO REPORT DUE TO ICTERUS.PMF). Liver Function Tests: Recent Labs  Lab 11/23/19 0341 11/24/19 0431 11/24/19 0956 11/25/19 0030 11/26/19 0700  AST 317* 328* 298* 296* 232*  ALT 62* 70* 64* 73* 62*  ALKPHOS 357* 418* 383* 443* 426*  BILITOT 21.5* 23.7* 21.6* 24.0* 23.3*  PROT 5.6* 5.7* 5.3* 5.7* 5.5*  ALBUMIN 2.3* 2.3* 2.1* 2.2* 2.1*   Recent Labs  Lab 11/24/19 0610  LIPASE 80*  AMYLASE 131*   Recent Labs  Lab 11/10/2019 1334 11/24/19 0611  AMMONIA 106* 68*   Coagulation Profile: Recent Labs  Lab 11/30/2019 1456  INR 1.3*   Cardiac Enzymes: No results for input(s): CKTOTAL, CKMB, CKMBINDEX, TROPONINI in the last 168 hours. BNP (last 3 results) No results for input(s): PROBNP in the last 8760 hours. HbA1C: No results for input(s): HGBA1C in the last 72 hours. CBG: Recent Labs  Lab 11/25/19 1525 11/25/19 1916 11/25/19 2339 11/26/19 0316 11/26/19 0733  GLUCAP 92 110* 110* 103* 104*   Lipid Profile: No results for input(s): CHOL, HDL, LDLCALC, TRIG, CHOLHDL, LDLDIRECT in the last 72 hours. Thyroid Function Tests: No results for input(s): TSH, T4TOTAL, FREET4, T3FREE, THYROIDAB in the  last 72 hours. Anemia Panel: No results for input(s): VITAMINB12, FOLATE, FERRITIN, TIBC, IRON, RETICCTPCT in the last 72 hours. Sepsis Labs: Recent Labs  Lab 11/26/2019 1549 11/07/2019 1804 11/08/2019 2036 11/22/19 0048 11/23/19 0341  PROCALCITON  --   --  1.20 1.38 1.47  LATICACIDVEN >11.0* >11.0* 10.9* 7.6*  --     Recent Results (from the past 240 hour(s))  Blood culture (routine x 2)     Status: None   Collection Time: 11/20/2019  3:49 PM   Specimen: BLOOD  Result Value Ref Range Status   Specimen Description BLOOD BLOOD RIGHT HAND  Final   Special Requests   Final    BOTTLES DRAWN AEROBIC AND ANAEROBIC Blood Culture adequate volume  Culture   Final    NO GROWTH 5 DAYS Performed at [facility], [address] [place] [place], [place] [address]    Report Status 11/26/2019 FINAL  Final  Blood culture (routine x 2)     Status: None   Collection Time: 11/25/2019  4:40 PM   Specimen: BLOOD  Result Value Ref Range Status   Specimen Description BLOOD LEFT SHOULDER  Final   Special Requests   Final    BOTTLES DRAWN AEROBIC AND ANAEROBIC Blood Culture adequate volume   Culture   Final    NO GROWTH 5 DAYS Performed at [facility] [place], [address] [place], [place] [address]    Report Status 11/26/2019 FINAL  Final  Respiratory Panel by RT PCR (Flu A&B, Covid) - Nasopharyngeal Swab     Status: None   Collection Time: 11/25/2019  8:45 PM   Specimen: Nasopharyngeal Swab  Result Value Ref Range Status   SARS Coronavirus 2 by RT PCR NEGATIVE NEGATIVE Final    Comment: (NOTE) SARS-CoV-2 target nucleic acids are NOT DETECTED.  The SARS-CoV-2 RNA is generally detectable in upper respiratoy specimens during the acute phase of infection. The lowest concentration of SARS-CoV-2 viral copies this assay can detect is 131 copies/mL. A negative result does not preclude SARS-Cov-2 infection and should not be used as the sole basis for treatment or other patient management decisions.  A negative result may occur with  improper specimen collection/handling, submission of specimen other than nasopharyngeal swab, presence of viral mutation(s) within the areas targeted by this assay, and inadequate number of viral copies (<131 copies/mL). A negative result must be combined with clinical observations, patient history, and epidemiological information. The expected result is Negative.  Fact Sheet for Patients:  https://www.moore.com/  Fact Sheet for Healthcare Providers:  https://www.young.biz/  This test is no t yet approved or cleared by the [place] FDA and  has been authorized for detection and/or diagnosis of SARS-CoV-2 by FDA under an Emergency Use Authorization (EUA). This EUA will remain  in effect (meaning this test can be used) for the duration of the COVID-19 declaration under Section 564(b)(1) of the Act, 21 U.S.C. section 360bbb-3(b)(1), unless the authorization is terminated or revoked sooner.     Influenza A by PCR NEGATIVE NEGATIVE Final   Influenza B by PCR NEGATIVE NEGATIVE Final    Comment: (NOTE) The Xpert Xpress SARS-CoV-2/FLU/RSV assay is intended as an aid in  the diagnosis of influenza from Nasopharyngeal swab specimens and  should not be used as a sole basis for treatment. Nasal washings and  aspirates are unacceptable for Xpert Xpress SARS-CoV-2/FLU/RSV  testing.  Fact Sheet for Patients: https://www.moore.com/  Fact Sheet for Healthcare Providers: https://www.young.biz/  This test is not yet approved or cleared by the [place] FDA and  has been authorized for detection and/or diagnosis of SARS-CoV-2 by  FDA under an Emergency Use Authorization (EUA). This EUA will remain  in effect (meaning this test can be used) for the duration of the  Covid-19 declaration under Section 564(b)(1) of the Act, 21  U.S.C. section 360bbb-3(b)(1), unless the  authorization is  terminated or revoked. Performed at [facility], [address] [place], [place] [address]   MRSA PCR Screening     Status: None   Collection Time: 11/22/19 12:40 AM   Specimen: Nasal Mucosa; Nasopharyngeal  Result Value Ref Range Status   MRSA by PCR NEGATIVE NEGATIVE Final    Comment:  The GeneXpert MRSA Assay (FDA approved for NASAL specimens only), is one component of a comprehensive MRSA colonization surveillance program. It is not intended to diagnose MRSA infection nor to guide or monitor treatment for MRSA infections. Performed at [facility], [address] [place], [place] [address]   Urine Culture     Status: Abnormal   Collection Time: 11/22/19  1:50 AM   Specimen: Urine, Random  Result Value Ref Range Status   Specimen Description   Final    URINE, RANDOM Performed at [facility], [address] [place], [place] [address]    Special Requests   Final    NONE Performed at [facility], [address] [place], [place] [address]    Culture MULTIPLE SPECIES PRESENT, SUGGEST RECOLLECTION (A)  Final   Report Status 11/23/2019 FINAL  Final         Radiology Studies: No results found.      Scheduled Meds: . Chlorhexidine Gluconate Cloth  6 each Topical Daily  . folic acid  1 mg Oral Daily  . heparin  5,000 Units Subcutaneous Q8H  . lactulose  30 g Oral QID  . mouth rinse  15 mL Mouth Rinse BID  . oxcarbazepine  600 mg Oral BID  . pantoprazole (PROTONIX) IV  40 mg Intravenous Q24H  . rifaximin  550 mg Oral BID  . sodium chloride flush  10-40 mL Intracatheter Q12H  . spironolactone  25 mg Oral Daily  . thiamine  100 mg Oral Daily   Or  . thiamine  100 mg Intravenous Daily   Continuous Infusions: . dextrose 5 % and 0.9 % NaCl with KCl 20 mEq/L       LOS: 5 days    Time spent: 38 minutes    Marrion Coyekui Umar Patmon, MD [facility]   To contact the attending provider between  7A-7P or the covering provider during after hours 7P-7A, please log into the web site [email or web address] and access using universal [place] password for that web site. If you do not have the password, please call the hospital operator.  11/26/2019, 9:16 AM

## 2019-11-27 ENCOUNTER — Inpatient Hospital Stay: Payer: No Typology Code available for payment source

## 2019-11-27 DIAGNOSIS — K7010 Alcoholic hepatitis without ascites: Secondary | ICD-10-CM

## 2019-11-27 DIAGNOSIS — K7031 Alcoholic cirrhosis of liver with ascites: Secondary | ICD-10-CM | POA: Diagnosis not present

## 2019-11-27 DIAGNOSIS — E871 Hypo-osmolality and hyponatremia: Secondary | ICD-10-CM | POA: Diagnosis not present

## 2019-11-27 DIAGNOSIS — K7290 Hepatic failure, unspecified without coma: Secondary | ICD-10-CM | POA: Diagnosis not present

## 2019-11-27 LAB — HEPATIC FUNCTION PANEL
ALT: 58 U/L — ABNORMAL HIGH (ref 0–44)
AST: 204 U/L — ABNORMAL HIGH (ref 15–41)
Albumin: 2.1 g/dL — ABNORMAL LOW (ref 3.5–5.0)
Alkaline Phosphatase: 449 U/L — ABNORMAL HIGH (ref 38–126)
Bilirubin, Direct: 14.9 mg/dL — ABNORMAL HIGH (ref 0.0–0.2)
Indirect Bilirubin: 9.9 mg/dL — ABNORMAL HIGH (ref 0.3–0.9)
Total Bilirubin: 24.8 mg/dL (ref 0.3–1.2)
Total Protein: 5.5 g/dL — ABNORMAL LOW (ref 6.5–8.1)

## 2019-11-27 LAB — CBC WITH DIFFERENTIAL/PLATELET
Abs Immature Granulocytes: 0.85 10*3/uL — ABNORMAL HIGH (ref 0.00–0.07)
Basophils Absolute: 0.1 10*3/uL (ref 0.0–0.1)
Basophils Relative: 1 %
Eosinophils Absolute: 0.3 10*3/uL (ref 0.0–0.5)
Eosinophils Relative: 3 %
HCT: 25.5 % — ABNORMAL LOW (ref 36.0–46.0)
Hemoglobin: 8.3 g/dL — ABNORMAL LOW (ref 12.0–15.0)
Immature Granulocytes: 7 %
Lymphocytes Relative: 14 %
Lymphs Abs: 1.8 10*3/uL (ref 0.7–4.0)
MCH: 37.2 pg — ABNORMAL HIGH (ref 26.0–34.0)
MCHC: 32.5 g/dL (ref 30.0–36.0)
MCV: 114.3 fL — ABNORMAL HIGH (ref 80.0–100.0)
Monocytes Absolute: 1.2 10*3/uL — ABNORMAL HIGH (ref 0.1–1.0)
Monocytes Relative: 10 %
Neutro Abs: 8 10*3/uL — ABNORMAL HIGH (ref 1.7–7.7)
Neutrophils Relative %: 65 %
Platelets: 129 10*3/uL — ABNORMAL LOW (ref 150–400)
RBC: 2.23 MIL/uL — ABNORMAL LOW (ref 3.87–5.11)
RDW: 19.4 % — ABNORMAL HIGH (ref 11.5–15.5)
WBC: 12.2 10*3/uL — ABNORMAL HIGH (ref 4.0–10.5)
nRBC: 4.7 % — ABNORMAL HIGH (ref 0.0–0.2)

## 2019-11-27 LAB — GLUCOSE, CAPILLARY
Glucose-Capillary: 96 mg/dL (ref 70–99)
Glucose-Capillary: 106 mg/dL — ABNORMAL HIGH (ref 70–99)
Glucose-Capillary: 88 mg/dL (ref 70–99)
Glucose-Capillary: 100 mg/dL — ABNORMAL HIGH (ref 70–99)
Glucose-Capillary: 97 mg/dL (ref 70–99)

## 2019-11-27 LAB — BASIC METABOLIC PANEL
Anion gap: 10 (ref 5–15)
BUN: <5 mg/dL — ABNORMAL LOW (ref 6–20)
CO2: 26 mmol/L (ref 22–32)
Calcium: 7.9 mg/dL — ABNORMAL LOW (ref 8.9–10.3)
Chloride: 100 mmol/L (ref 98–111)
Creatinine, Ser: UNDETERMINED mg/dL (ref 0.44–1.00)
Glucose, Bld: 105 mg/dL — ABNORMAL HIGH (ref 70–99)
Potassium: 3.3 mmol/L — ABNORMAL LOW (ref 3.5–5.1)
Sodium: 136 mmol/L (ref 135–145)

## 2019-11-27 LAB — PROCALCITONIN: Procalcitonin: 0.75 ng/mL

## 2019-11-27 LAB — PROTIME-INR
INR: 1.8 — ABNORMAL HIGH (ref 0.8–1.2)
Prothrombin Time: 20.2 seconds — ABNORMAL HIGH (ref 11.4–15.2)

## 2019-11-27 LAB — MAGNESIUM: Magnesium: 2.1 mg/dL (ref 1.7–2.4)

## 2019-11-27 LAB — PHOSPHORUS: Phosphorus: UNDETERMINED mg/dL (ref 2.5–4.6)

## 2019-11-27 LAB — AMMONIA: Ammonia: 102 umol/L — ABNORMAL HIGH (ref 9–35)

## 2019-11-27 IMAGING — US US ABDOMEN LIMITED
1 series · 8 of 8 positions shown · non-contrast
Comparison: CT abdomen and pelvis [DATE]

CLINICAL DATA: Ascites

EXAM:
LIMITED ABDOMEN ULTRASOUND FOR ASCITES
TECHNIQUE: Limited ultrasound survey for ascites was performed in all four
abdominal quadrants.

[Series 1: us abdomen limited · 8 of 8 slices shown]
[im 1/8]
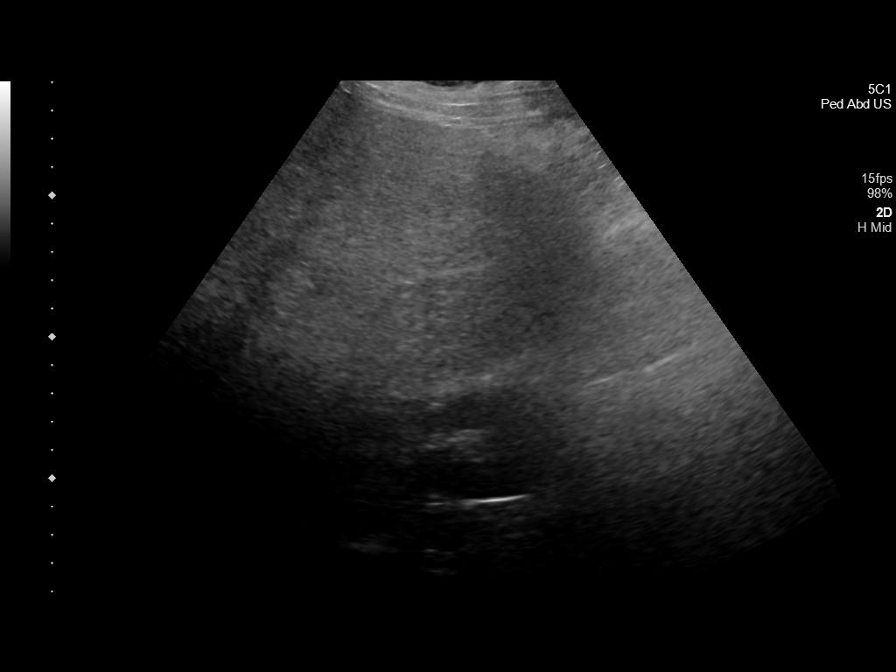
[im 2/8]
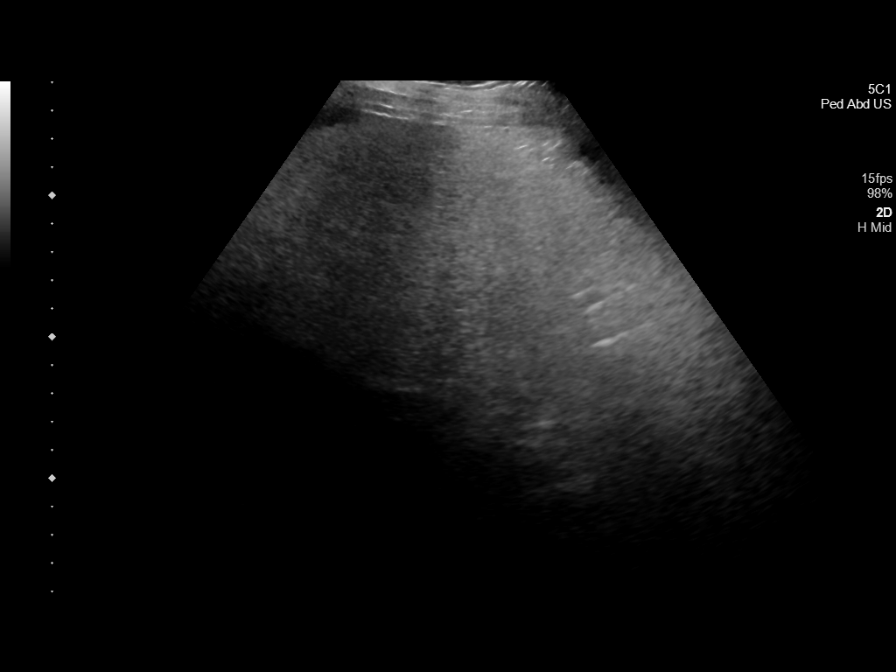
[im 3/8]
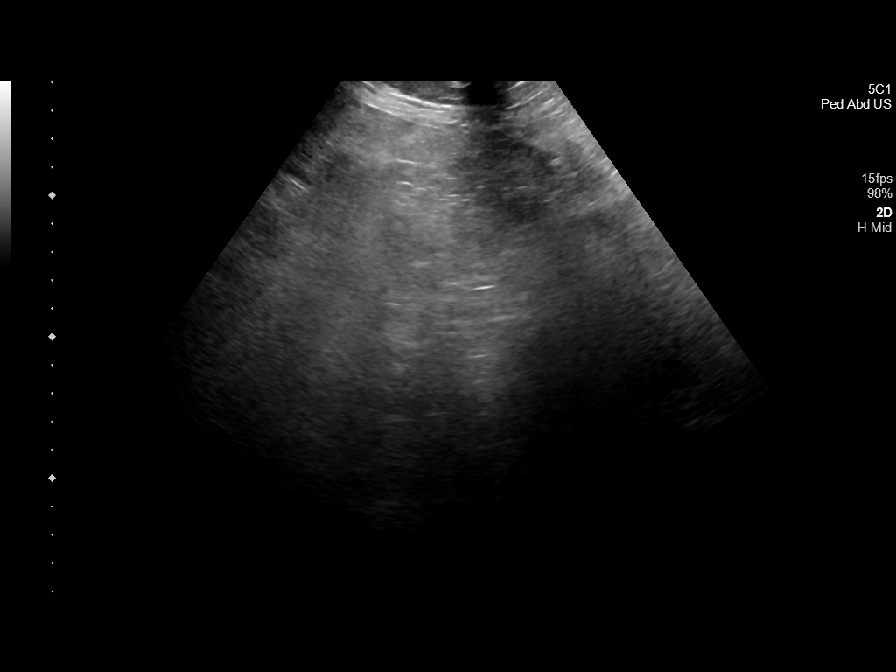
[im 4/8]
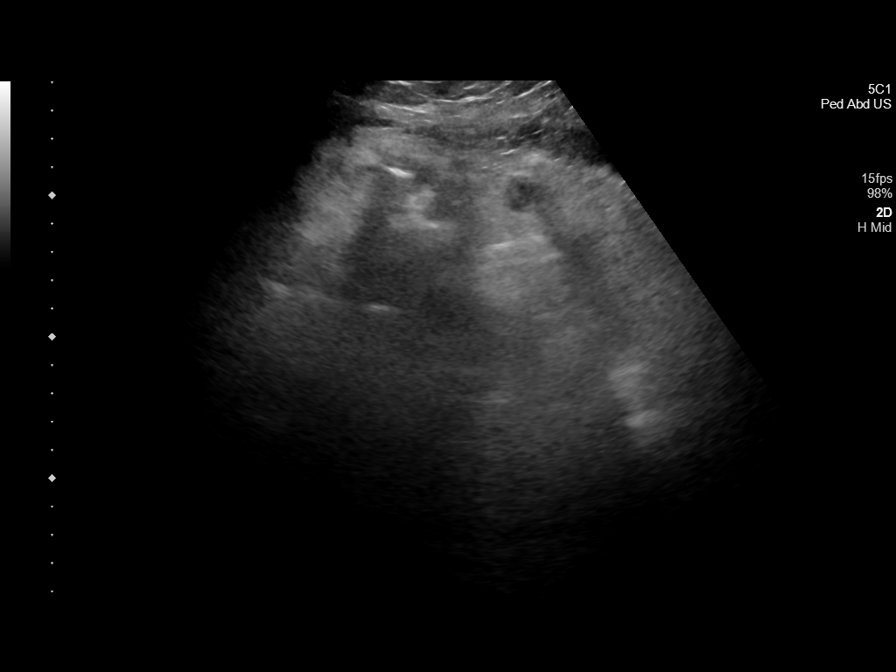
[im 5/8]
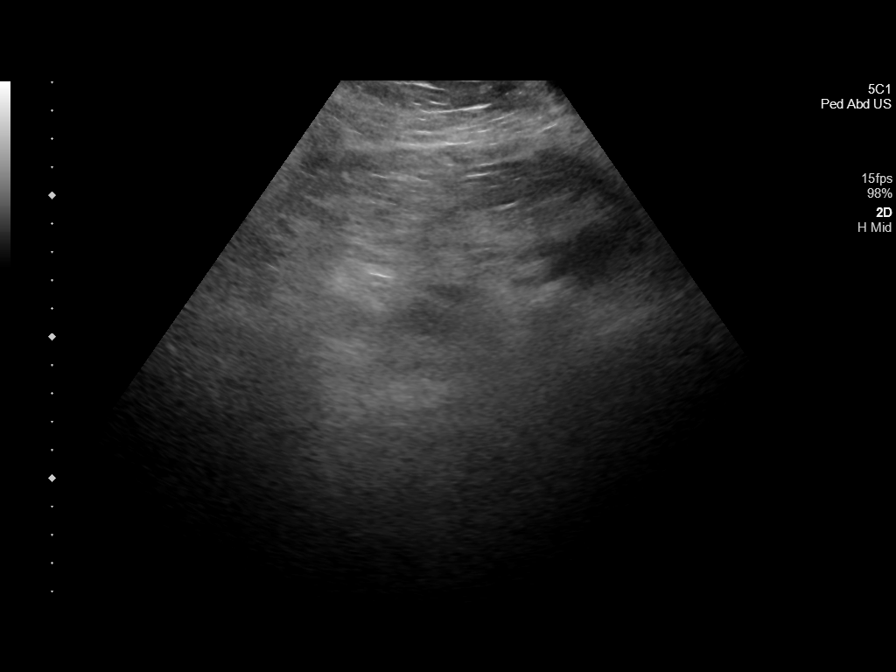
[im 6/8]
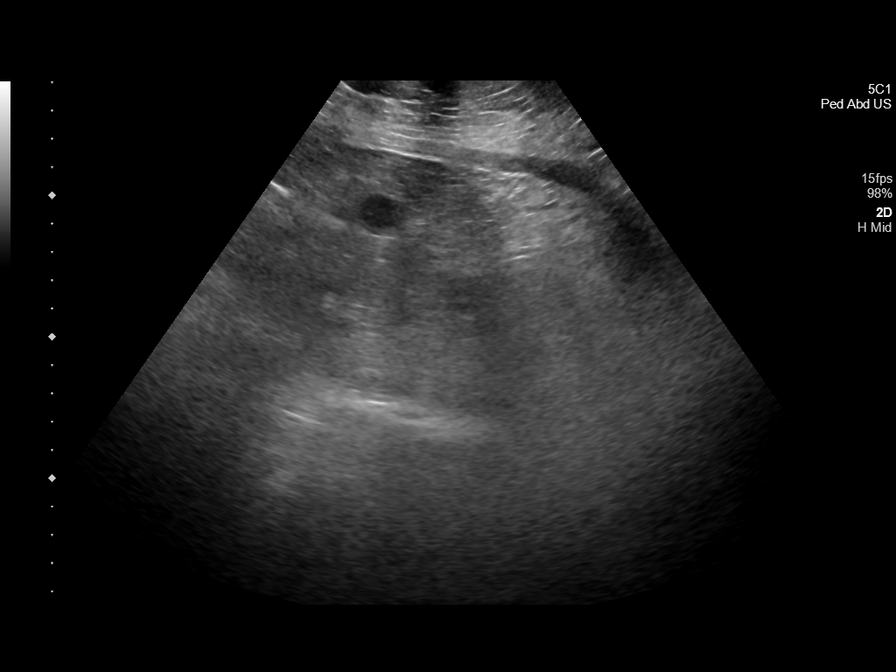
[im 7/8]
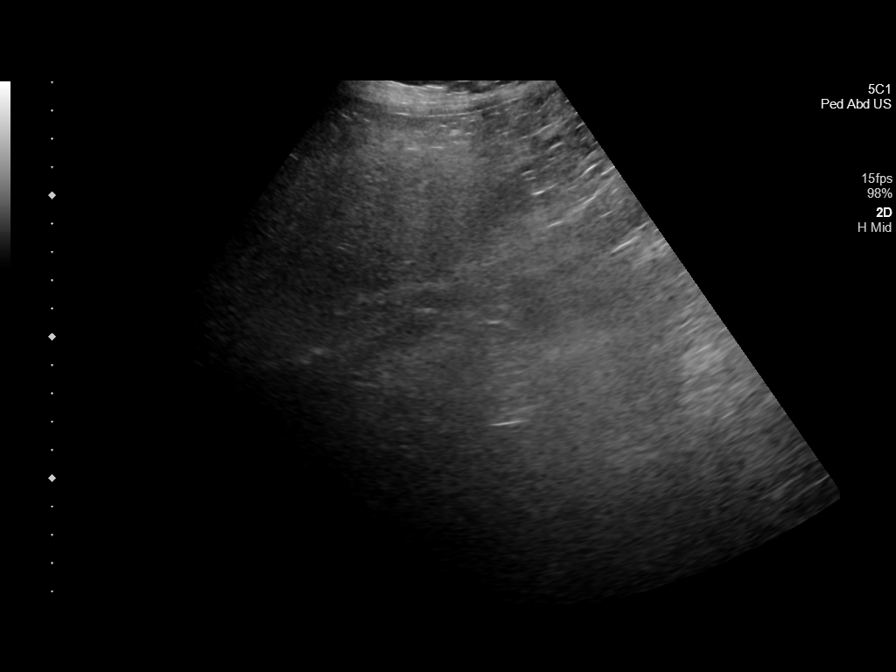
[im 8/8]
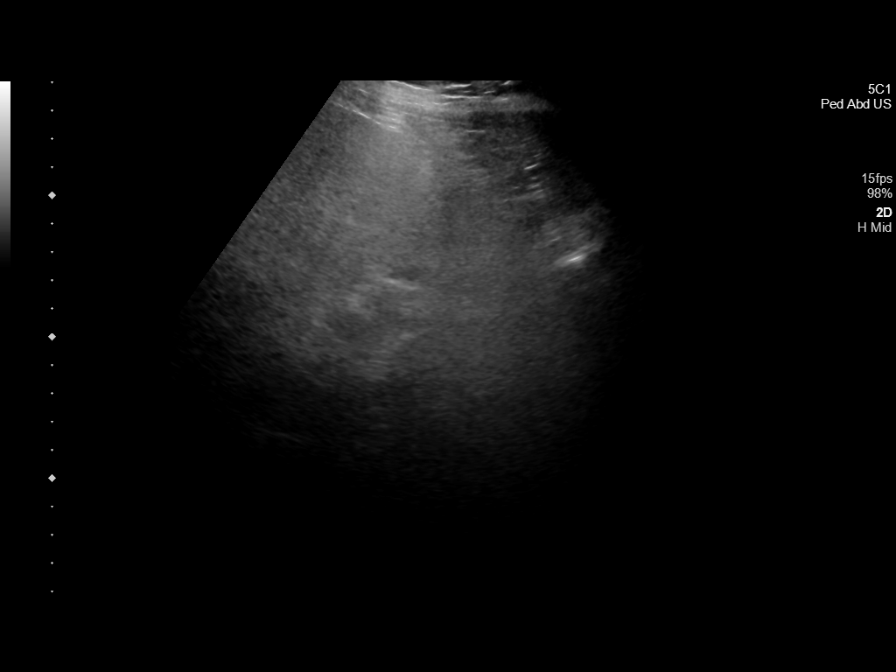

[8 of 8 positions shown; findings below may reference images not displayed]

FINDINGS: Only trace free fluid is present about the liver. No significant
pocket of fluid is demonstrated on exam for ascites.
IMPRESSION: Minimal fluid about the liver

## 2019-11-27 IMAGING — MR MR MRV ABDOMEN WO/W CM
14 of 18 series · 39 of 48 positions shown · IV contrast (7.5ml Gadavist)
Comparison: CT abdomen/pelvis [DATE]

CLINICAL DATA: 39-year-old female with decompensated alcoholic
cirrhosis. Evaluate for portal vein thrombosis.

EXAM:
MRA ABDOMEN AND PELVIS WITH CONTRAST
TECHNIQUE: Multiplanar, multiecho pulse sequences of the abdomen and pelvis
were obtained with intravenous contrast. Angiographic images of
abdomen and pelvis were obtained using MRA technique with
intravenous contrast. Venous phase imaging was obtained.
CONTRAST:  10mL GADAVIST GADOBUTROL 1 MMOL/ML IV SOLN

[Series 12: bSSFP · coronal · 5.0mm · 0.98mm/px · 1 of 50 slices shown (1 of 2)]
[im 1/50]
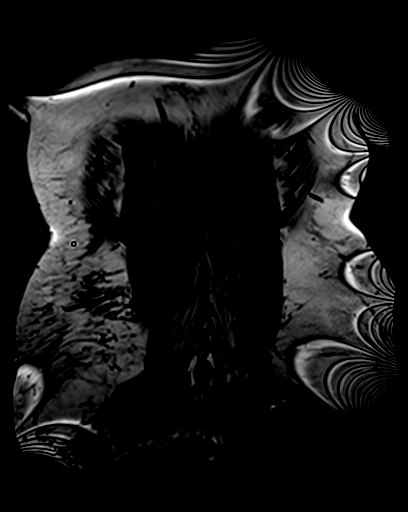

[Series 13: ax haste db · axial · 6.0mm · 1.31mm/px · 1 of 50 slices shown]
[im 1/50]
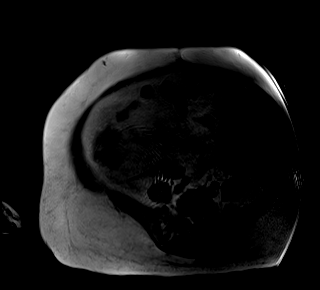

[Series 14: T2 fat-sat · axial · 6.0mm · 1.31mm/px · 1 of 50 slices shown]
[im 1/50]
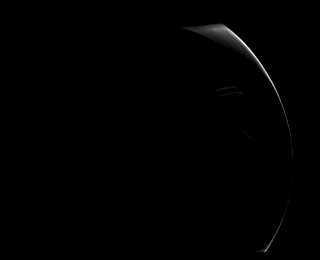

[Series 15: bSSFP · axial · 6.0mm · 0.82mm/px · 1 of 58 slices shown (2 of 2)]
[im 1/58]
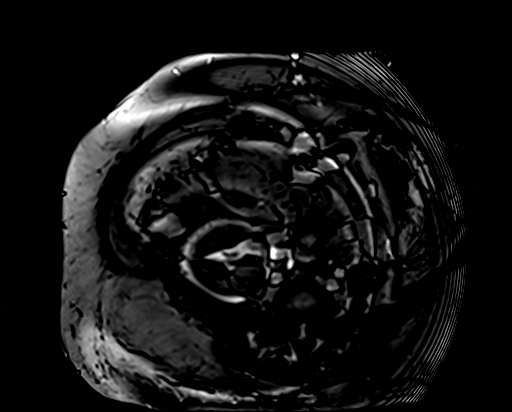

[Series 16: t1_vibe_fs_tra_p4_bh_pre · axial · 3.0mm · 1.31mm/px · z∈[-8,+322]mm · 3 of 112 slices shown]
[im 1/112]
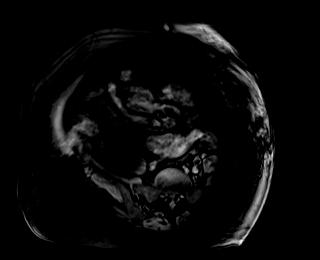
[im 56/112]
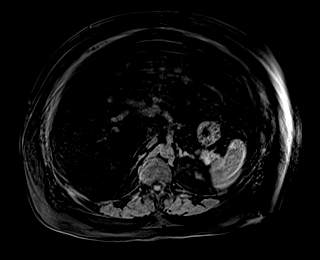
[im 112/112]
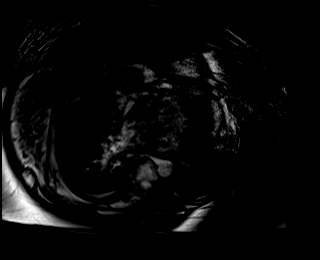

[Series 17: T1 dynamic · coronal · 3.0mm · 1.50mm/px · 2 of 88 slices shown]
[im 1/88]
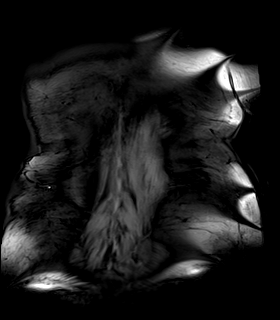
[im 88/88]
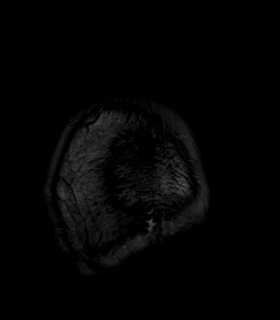

[Series 18: angio_fl3d_cor_pre · coronal · 1.1mm · 1.25mm/px · 3 of 112 slices shown]
[im 1/112]
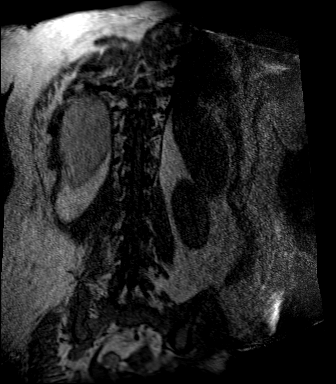
[im 56/112]
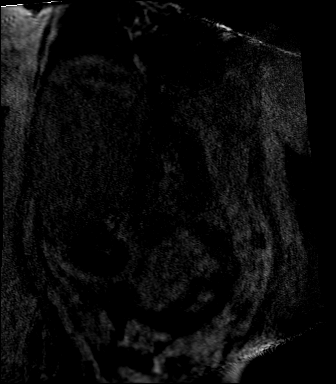
[im 112/112]
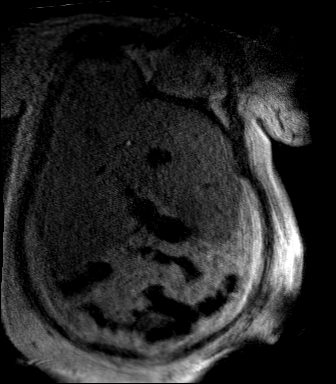

[Series 20: angio_fl3d_cor_post_arterial · coronal · 1.1mm · 1.25mm/px · 4 of 112 slices shown]
[im 1/112]
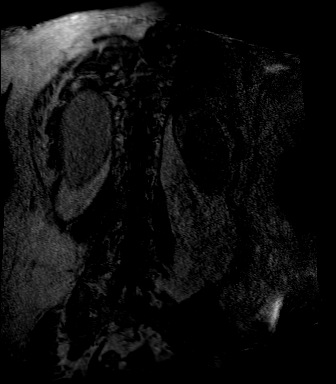
[im 38/112]
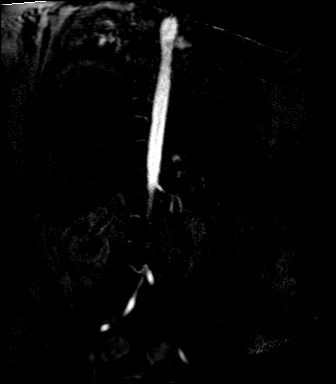
[im 75/112]
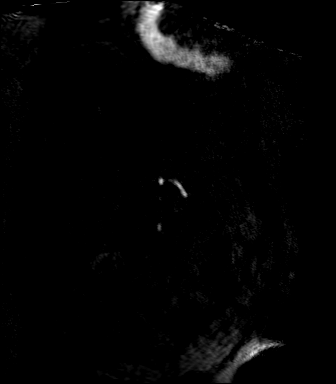
[im 112/112]
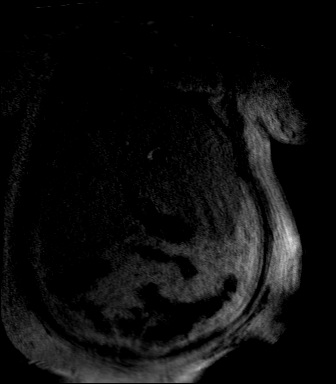

[Series 21: angio_fl3d_cor_post_arterial_sub · coronal · 1.1mm · 1.25mm/px · 3 of 109 slices shown]
[im 1/109]
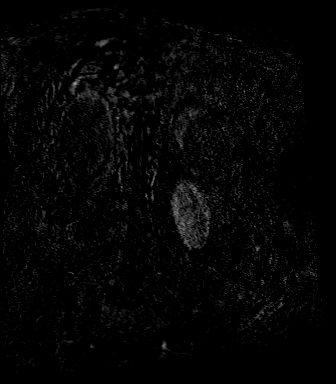
[im 55/109]
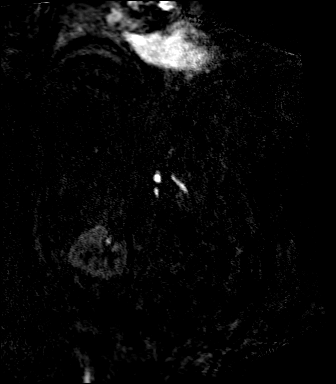
[im 109/109]
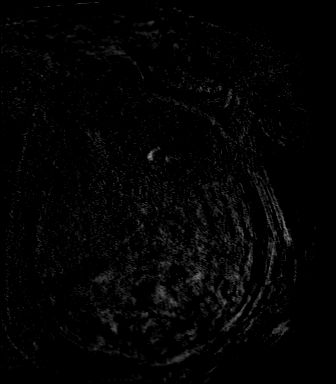

[Series 23: angio_fl3d_cor_post_venous · coronal · 1.1mm · 1.25mm/px · 4 of 112 slices shown]
[im 1/112]
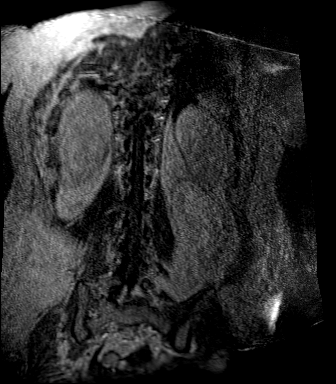
[im 38/112]
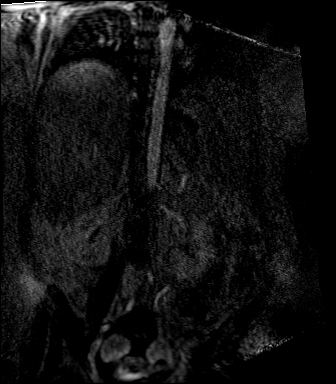
[im 75/112]
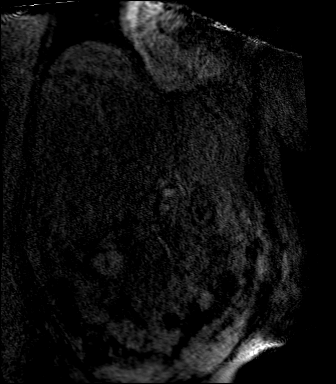
[im 112/112]
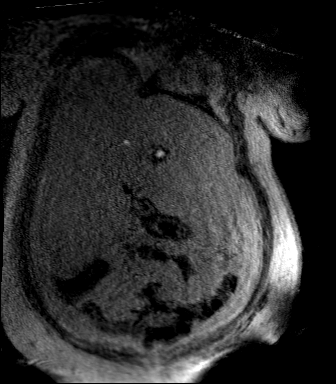

[Series 24: angio_fl3d_cor_post_venous_sub · coronal · 1.1mm · 1.25mm/px · 4 of 112 slices shown]
[im 1/112]
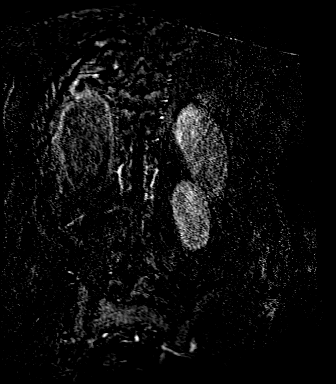
[im 38/112]
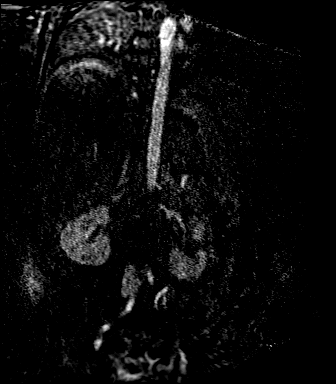
[im 75/112]
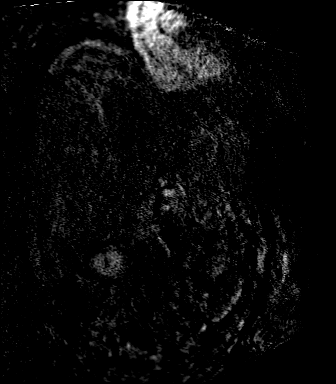
[im 112/112]
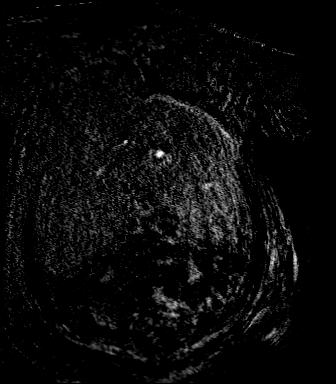

[Series 26: angio_fl3d_cor_post_delayed_venous · coronal · 1.1mm · 1.25mm/px · 4 of 112 slices shown]
[im 1/112]
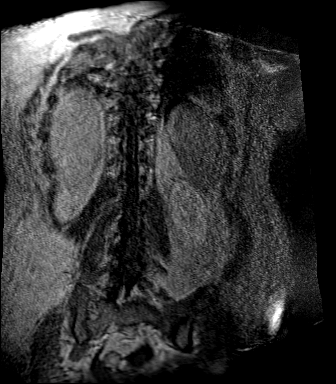
[im 38/112]
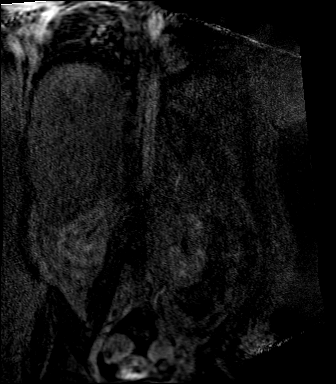
[im 75/112]
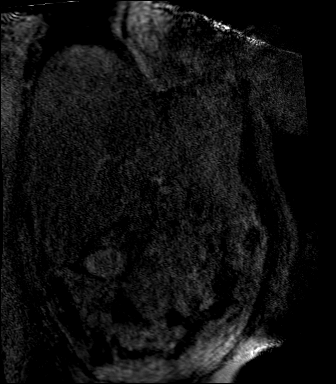
[im 112/112]
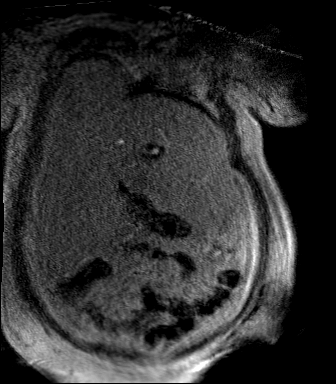

[Series 27: angio_fl3d_cor_post_delayed_venous_sub · coronal · 1.1mm · 1.25mm/px · 4 of 112 slices shown]
[im 1/112]
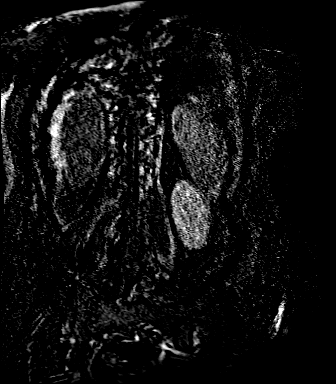
[im 38/112]
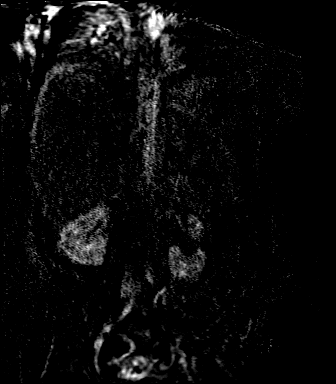
[im 75/112]
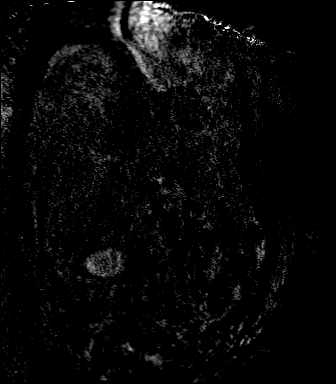
[im 112/112]
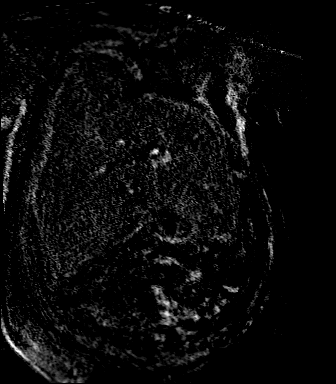

[Series 29: t1_vibe_fs_tra_p4_bh · axial · 3.0mm · 1.31mm/px · z∈[-8,+322]mm · 4 of 112 slices shown]
[im 1/112]
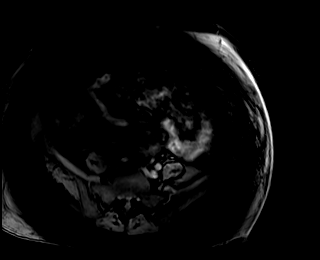
[im 38/112]
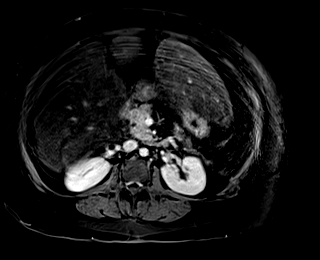
[im 75/112]
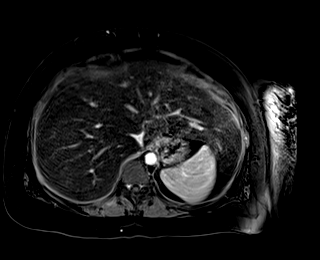
[im 112/112]
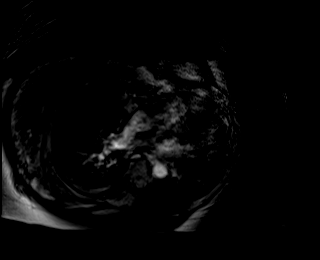

[39 of 48 positions shown; findings below may reference images not displayed]

FINDINGS: MRA ABDOMEN FINDINGS

Aorta: Normal caliber aorta without evidence of dissection, aneurysm
or significant stenosis.

Celiac axis: Patent.  Conventional hepatic arterial anatomy.

SMA: Widely patent.

Renals: There are 2 right-sided renal arteries and a single
left-sided renal artery. Renal arteries are patent without
significant stenosis, aneurysm, dissection or evidence of
fibromuscular dysplasia.

IMA: Not well seen.

Iliac arteries: Normal in caliber. No evidence of stenosis, aneurysm
or dissection.

Venous: Portal venous phase imaging demonstrates patency of the main
portal vein and intrahepatic portal venous branches. No evidence of
portal vein thrombosis. Additionally, the hepatic veins are also
patent. The hepatic veins and intrahepatic IVC are slightly
compressed, likely secondary to relative hepatic steatosis and
hepatomegaly. No significant varices are visualized.

MRI ABD FINDINGS

Hepatomegaly. The liver measures up to 29 cm in craniocaudal
dimension. The hepatic parenchyma is slightly hyperintense on T1
weighted imaging, hypointense on T2 weighted imaging and
demonstrates significant signal dropout on fat saturation images.
Findings are consistent with hepatic steatosis.

No discrete hepatic lesion identified.

No intra or extrahepatic biliary ductal dilatation. Unremarkable
pancreas without evidence of inflammation or mass. The spleen is
normal in size.

Kidneys and adrenal glands are unremarkable in appearance.

No evidence of bowel obstruction or focal bowel abnormality.

The visualized lower chest is unremarkable.

No focal abnormal signal or enhancement within the visualized
musculoskeletal structures.
IMPRESSION: 1. The portal and hepatic veins remain patent. No evidence of portal
venous thrombosis.
2. Hepatomegaly with marked steatosis.
3. No evidence of discrete hepatic lesion.

## 2019-11-27 IMAGING — US US HEPATIC LIVER DOPPLER
3 series · 13 of 25 positions shown · non-contrast
Comparison: CT evaluation from [DATE]

CLINICAL DATA: Alcoholic cirrhosis

EXAM:
DUPLEX ULTRASOUND OF LIVER
TECHNIQUE: Color and duplex Doppler ultrasound was performed to evaluate the
hepatic in-flow and out-flow vessels.

[Series 1: us liver doppler · 11 of 68 slices shown]
[im 1/68]
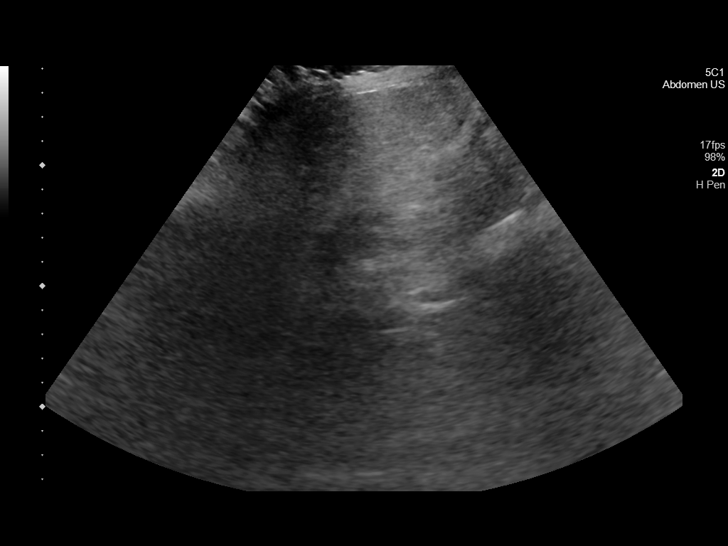
[im 7/68]
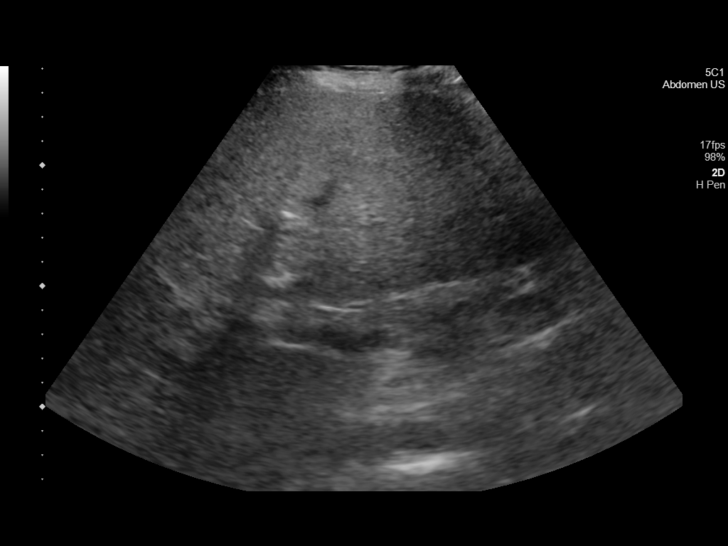
[im 13/68]
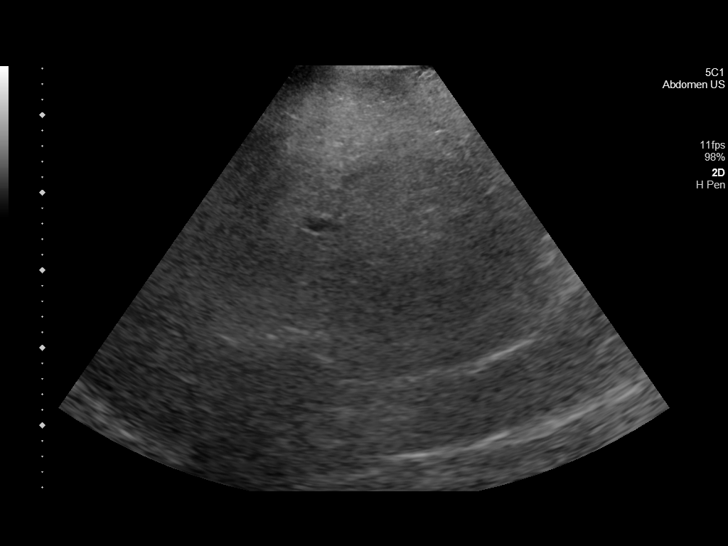
[im 20/68]
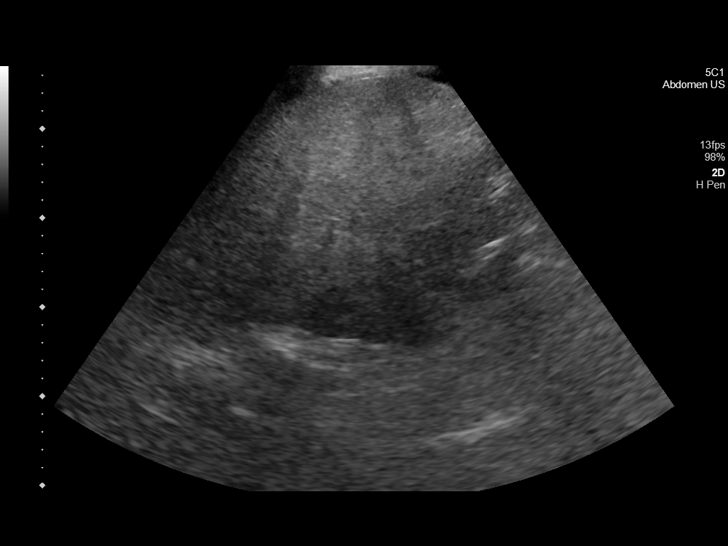
[im 26/68]
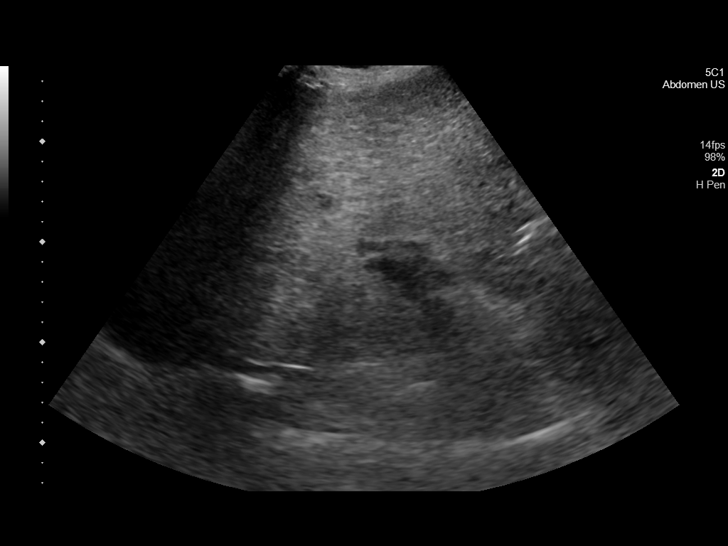
[im 32/68]
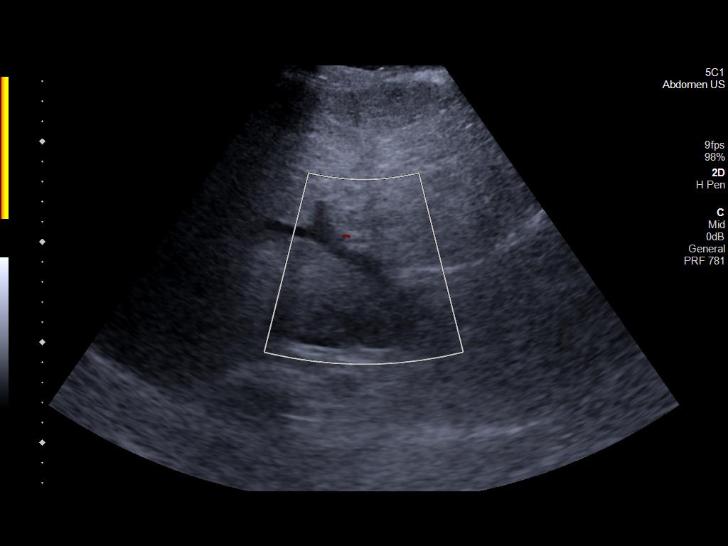
[im 39/68]
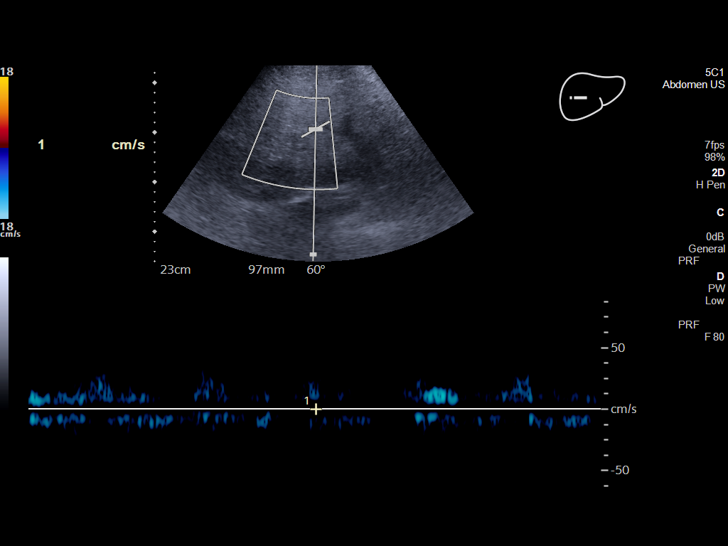
[im 45/68]
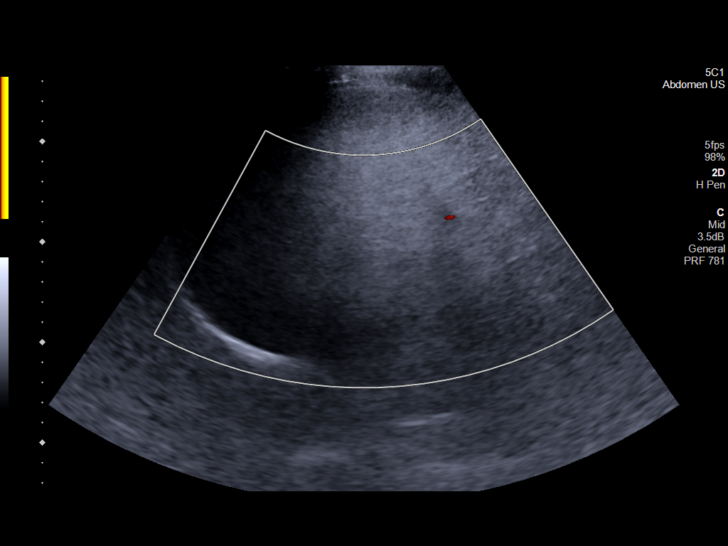
[im 52/68]
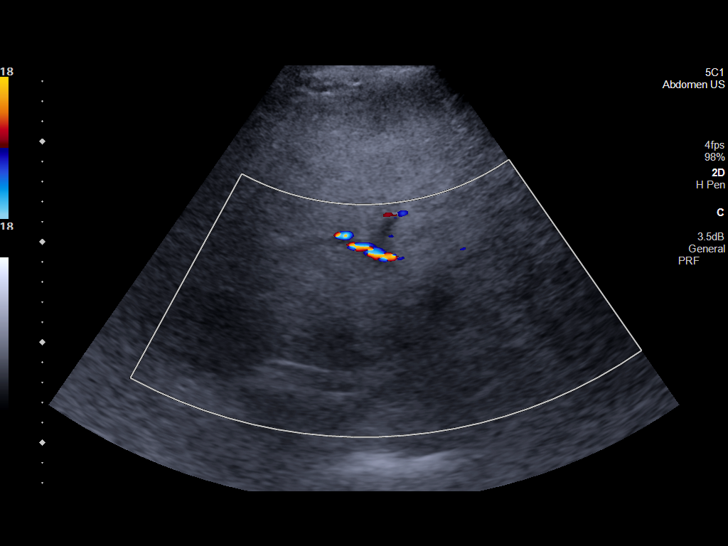
[im 58/68]
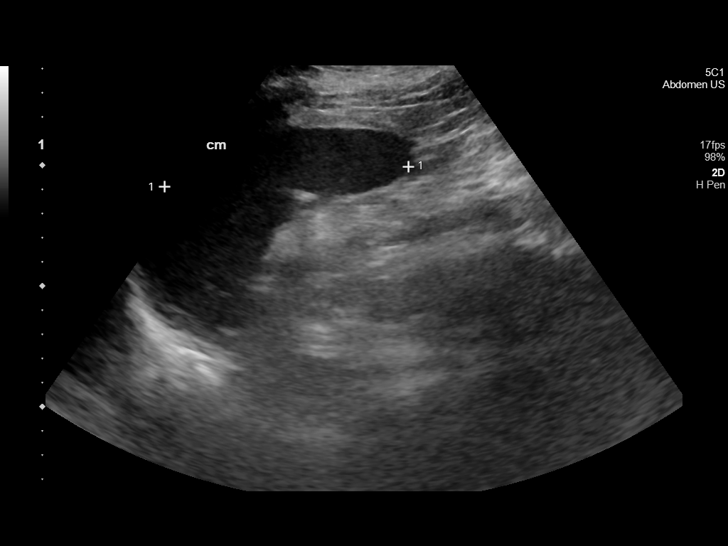
[im 64/68]
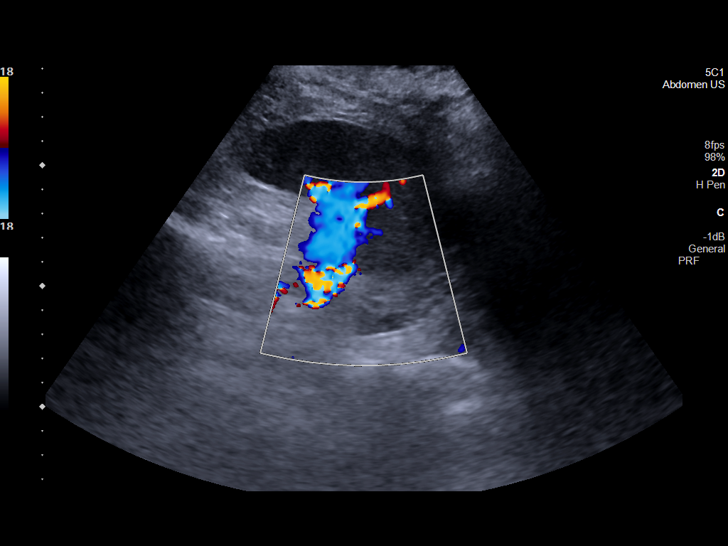

[Series 1001: abdomen us · 1 of 6 slices shown (1 of 2)]
[im 1/6]
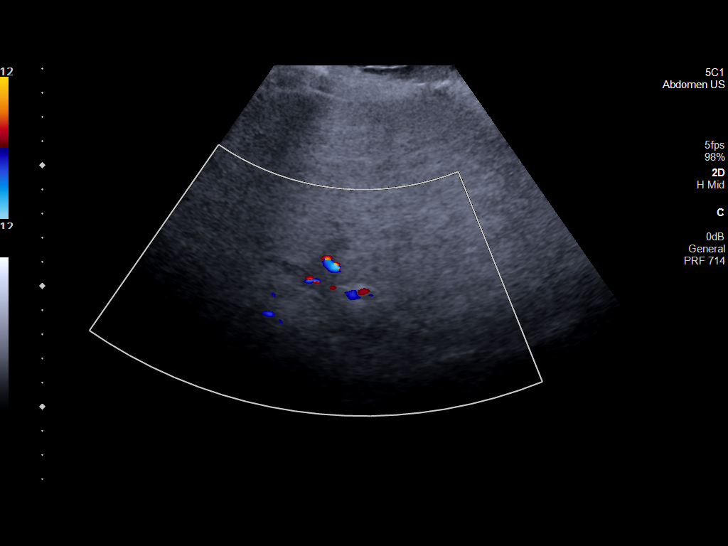

[Series 1002: abdomen us · 1 of 4 slices shown (2 of 2)]
[im 1/4]
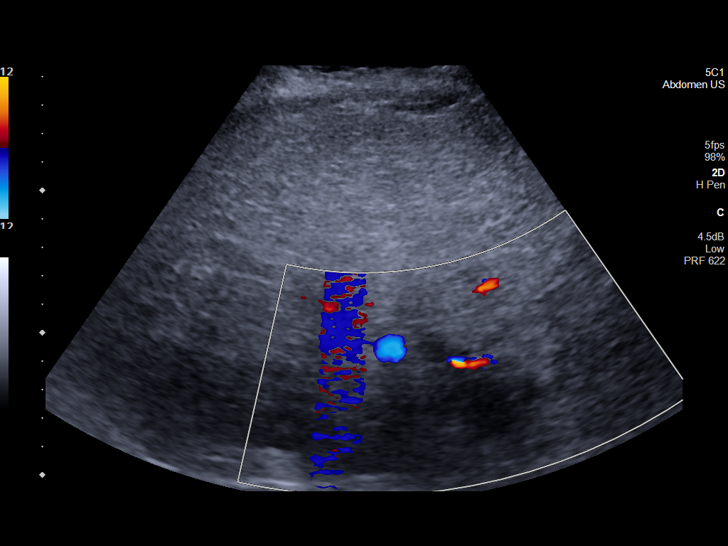

[13 of 25 positions shown; findings below may reference images not displayed]

FINDINGS: Liver: Marked increased echogenicity of the liver which attenuates
the ultrasound beam making remaining assessments quite limited.
Heterogeneity of echotexture. Nodular contour, mildly nodular
contour.

Assessment for focal lesions is limited by the severe fatty
infiltration on the current study. No gross lesion is seen.

Main Portal Vein size: 1.8 cm

Portal Vein Velocities

Portal vein can be visualized on grayscale but power Doppler or
color Doppler flow cannot be obtained. Spectral Doppler without
convincing sign of flow as well.

Hepatic Vein Velocities

Hepatic veins are not visualized due to severe echogenicity
increased in the liver.

IVC: Patent with velocity of 32 centimeter/second and pulsatile flow
suggested though with limited assessment.

Hepatic Artery Velocity:  132 cm/sec

Splenic Vein Velocity:  17.8 cm/sec

Spleen: 10.1 x 10.3 x 4.8 cm with a total volume of 259 cm^3 (411
cm^3 is upper limit normal)

Portal Vein Occlusion/Thrombus: Possible portal venous occlusion
though exam is markedly limited.

Splenic Vein Occlusion/Thrombus: No

Ascites: Trace

Varices: None visualized
IMPRESSION: 1. Findings that are highly suggestive of portal venous thrombosis.
Portal venous flow was visualized on the study of [DATE]. For
confirmation, given the technical limitations of the current study
would consider MR venography or CT venography as possible in this
patient.
2. Hepatic veins cannot be visualized, of uncertain significance
given the severity of hepatic steatosis.
3. Severe hepatic steatosis.

These results were called by telephone at the time of interpretation
on [DATE] at [DATE] to provider GOETZ , who verbally
acknowledged these results.

## 2019-11-27 MED ORDER — METHYLPREDNISOLONE SODIUM SUCC 40 MG IJ SOLR
40.0000 mg | Freq: Every day | INTRAMUSCULAR | Status: DC
  Administered 2019-11-27 – 2019-12-02 (×6): 40 mg via INTRAVENOUS
  Filled 2019-11-27 (×6): qty 1

## 2019-11-27 MED ORDER — OCUVITE-LUTEIN PO CAPS
1.0000 | ORAL_CAPSULE | Freq: Every day | ORAL | Status: DC
  Administered 2019-11-28 – 2019-11-30 (×3): 1 via ORAL
  Filled 2019-11-27 (×5): qty 1

## 2019-11-27 MED ORDER — SODIUM CHLORIDE 0.9 % IV BOLUS: 500.0000 mL | Freq: Once | INTRAVENOUS | Status: DC

## 2019-11-27 MED ORDER — ALBUMIN HUMAN 25 % IV SOLN
50.0000 g | Freq: Once | INTRAVENOUS | Status: DC
  Filled 2019-11-27: qty 200

## 2019-11-27 MED ORDER — LACTULOSE 10 GM/15ML PO SOLN
30.0000 g | Freq: Two times a day (BID) | ORAL | Status: DC
  Administered 2019-11-28 – 2019-11-30 (×6): 30 g via ORAL
  Filled 2019-11-27 (×6): qty 60

## 2019-11-27 MED ORDER — POTASSIUM CHLORIDE 10 MEQ/100ML IV SOLN
10.0000 meq | INTRAVENOUS | Status: AC
  Administered 2019-11-27 (×2): 10 meq via INTRAVENOUS
  Filled 2019-11-27: qty 100

## 2019-11-27 NOTE — Progress Notes (Signed)
Updated discriminant function based on labs from today is 47.8.  Therefore, recommended to start methylprednisolone 40 mg IV daily until patient is safe to swallow p.o. medications Recommend ultrasound abdomen to evaluate for ascites and rule out SBP, urine analysis as well as blood cultures to rule out any infection  Arlyss Repress, MD [address]  [address]  [place], [place] [address]  Main: [phone]  Fax: [phone] Pager: [phone]

## 2019-11-27 NOTE — Progress Notes (Signed)
PROGRESS NOTE    Ashlee Hawkins  FWY:[id number] DOB: 04-Feb-1981 DOA: 2019/11/24 PCP: [facility]   Chief complaint.  Altered mental status. Brief Narrative:  Patient is a 39 year old female with a history of alcohol abuse, seizure disorder and alcohol hepatitis who initially admited to ICU on 10/18 with a severe hyponatremia with sodium 108, ammonia 106.  She has been continuously drinking alcohol, last drink was before admission.  She was also having seizures. Since admission to the ICU, sodium level has went up after giving 3% sodium chloride, she was ago also giving lactulose.  She is very confused, mental status has not improved since admission.  Her total bilirubin was 23.3.  Ammonia level still elevated.  Patient has been seen by palliative care, long-term prognosis is very poor.  10/23.  Increase the lactulose dose, started rehydration. 10/24.  Patient mental status seem to be improving today.  Still has very high bilirubin level, consult GI.   Assessment & Plan:   Active Problems:   Hyponatremia with decreased serum osmolality   Alcoholic cirrhosis of liver with ascites (HCC)   Hepatic encephalopathy (HCC)   Goals of care, counseling/discussion   Palliative care by specialist   DNR (do not resuscitate)   Seizure (HCC)  #1.  Hepatic encephalopathy. Ammonia level still very high.  However, patient seem to be responding today.  Mental status improving. Continue IV fluids, lactulose, rifaximin.  2.  Alcohol liver cirrhosis and ascites. Patient still has very severe jaundice, will consult GI, consider steroids.  3.  Severe hyponatremia. Sodium level had improved. Called the lab, it is not possible to measure creatinine and phosphorus level due to extremely high bilirubin level.   4.  Severe protein calorie malnutrition. Patient still has a poor appetite, continue supplements.  5.  Anemia of chronic disease. Follow.  6.  Aspiration pneumonia.   POA. Continue oral Augmentin.  7.  Anemia of chronic disease. Follow.  8.  Hypokalemia. Supplemented again.     DVT prophylaxis: Heparin Code Status: DNR Family Communication: updated husband   .   Status is: Inpatient  Remains inpatient appropriate because:Inpatient level of care appropriate due to severity of illness   Dispo: The patient is from: Home              Anticipated d/c is to: uncertain              Anticipated d/c date is: > 3 days              Patient currently is not medically stable to d/c.        I/O last 3 completed shifts: In: 486.5 [I.V.:486.5] Out: 1900 [Urine:200; Stool:1700] No intake/output data recorded.     Consultants:   GI  Procedures: None  Antimicrobials: Augmentin  Subjective: Patient is very confused, she has very poor appetite.  No nausea vomiting.  No abdominal pain. Denies any short of breath or cough. No fever or chills. No dysuria or hematuria. No headache or dizziness. Patient has not been able to ambulate since admission.   Objective: Vitals:   11/26/19 2006 11/26/19 2320 11/27/19 0617 11/27/19 0823  BP: 113/81 109/82 103/71 130/78  Pulse: (!) 103 98 100 69  Resp: 17 15 18 16   Temp: 97.6 F (36.4 C) 97.7 F (36.5 C) 97.6 F (36.4 C) 97.6 F (36.4 C)  TempSrc: Oral Oral  Oral  SpO2:  98% 98% 90%  Weight:      Height:  Intake/Output Summary (Last 24 hours) at 11/27/2019 0958 Last data filed at 11/26/2019 2141 Gross per 24 hour  Intake 75 ml  Output 1000 ml  Net -925 ml   Filed Weights   11/23/19 0347 11/24/19 0351 11/26/19 0408  Weight: 89.7 kg 91.1 kg 90.6 kg    Examination:  General exam: Appears calm and comfortable  Respiratory system: Clear to auscultation. Respiratory effort normal. Cardiovascular system: S1 & S2 heard, RRR. No JVD, murmurs, rubs, gallops or clicks. No pedal edema. Gastrointestinal system: Abdomen is distended, soft and nontender. No organomegaly or masses felt.  Normal bowel sounds heard. Central nervous system: Drowsy and oriented x2. No focal neurological deficits. Extremities: Symmetric 5 x 5 power. Skin: Severe jaundice Psychiatry:  Mood & affect appropriate.     Data Reviewed: I have personally reviewed following labs and imaging studies  CBC: Recent Labs  Lab 11/23/19 0341 11/24/19 0431 11/25/19 0030 11/26/19 0700 11/27/19 0413  WBC 8.7 8.5 9.9 12.1* 12.2*  NEUTROABS 6.7 6.1 6.5 7.9* 8.0*  HGB 8.4* 8.1* 8.6* 8.4* 8.3*  HCT 23.2* 23.5* 25.6* 25.2* 25.5*  MCV 104.0* 108.3* 110.8* 110.5* 114.3*  PLT 146* 173 188 148* 129*   Basic Metabolic Panel: Recent Labs  Lab 11/23/19 0341 11/23/19 1410 11/24/19 0431 11/24/19 0610 11/24/19 1650 11/24/19 2055 11/25/19 0030 11/25/19 0620 11/25/19 0957 11/25/19 1350 11/25/19 1746 11/26/19 0700 11/27/19 0413  NA 121*   < > 128*   < >  --    < > 131*  130*   < > 131* 132* 132* 132* 136  K 2.8*   < > 2.9*  --  3.4*  --  3.4*  3.4*  --   --   --   --  3.4* 3.3*  CL 74*  --  84*  --   --   --  92*  --   --   --   --  95* 100  CO2 35*  --  33*  --   --   --  29  --   --   --   --  28 26  GLUCOSE 91  --  93  --   --   --  103*  --   --   --   --  112* 105*  BUN <5*  --  <5*  --   --   --  <5*  --   --   --   --  <5* <5*  CREATININE <0.30*  --  <0.30*  --   --   --  UNABLE TO REPORT DUE TO ICTERUS  --   --   --   --  UNABLE TO REPORT DUE TO ICTERUS.PMF UNABLE TO REPORT DUE TO ICTERUS  CALCIUM 7.1*  --  7.6*  --   --   --  7.9*  --   --   --   --  8.0* 7.9*  MG 2.4  --  2.3  --   --   --  2.3  2.3  --   --   --   --  2.1 2.1  PHOS UNABLE TO REPORT DUE TO ICTERUS  --  UNABLE TO REPORT DUE TO ICTERUS  --   --   --  UNABLE TO REPORT DUE TO ICTERUS  UNABLE TO REPORT DUE TO ICTERUS  --   --   --   --  UNABLE TO REPORT DUE TO ICTERUS.PMF UNABLE TO REPORT DUE TO ICTERUS   < > =  values in this interval not displayed.   GFR: CrCl cannot be calculated (This lab value cannot be used to calculate  CrCl because it is not a number: UNABLE TO REPORT DUE TO ICTERUS). Liver Function Tests: Recent Labs  Lab 11/24/19 0431 11/24/19 0956 11/25/19 0030 11/26/19 0700 11/27/19 0413  AST 328* 298* 296* 232* 204*  ALT 70* 64* 73* 62* 58*  ALKPHOS 418* 383* 443* 426* 449*  BILITOT 23.7* 21.6* 24.0* 23.3* 24.8*  PROT 5.7* 5.3* 5.7* 5.5* 5.5*  ALBUMIN 2.3* 2.1* 2.2* 2.1* 2.1*   Recent Labs  Lab 11/24/19 0610  LIPASE 80*  AMYLASE 131*   Recent Labs  Lab 12/02/2019 1334 11/24/19 0611 11/27/19 0413  AMMONIA 106* 68* 102*   Coagulation Profile: Recent Labs  Lab 12/04/2019 1456  INR 1.3*   Cardiac Enzymes: No results for input(s): CKTOTAL, CKMB, CKMBINDEX, TROPONINI in the last 168 hours. BNP (last 3 results) No results for input(s): PROBNP in the last 8760 hours. HbA1C: No results for input(s): HGBA1C in the last 72 hours. CBG: Recent Labs  Lab 11/26/19 1116 11/26/19 1913 11/26/19 2322 11/27/19 0421 11/27/19 0820  GLUCAP 116* 109* 91 96 106*   Lipid Profile: No results for input(s): CHOL, HDL, LDLCALC, TRIG, CHOLHDL, LDLDIRECT in the last 72 hours. Thyroid Function Tests: No results for input(s): TSH, T4TOTAL, FREET4, T3FREE, THYROIDAB in the last 72 hours. Anemia Panel: No results for input(s): VITAMINB12, FOLATE, FERRITIN, TIBC, IRON, RETICCTPCT in the last 72 hours. Sepsis Labs: Recent Labs  Lab 11/22/2019 1549 11/14/2019 1804 11/26/2019 2036 11/22/19 0048 11/23/19 0341 11/27/19 0413  PROCALCITON  --   --  1.20 1.38 1.47 0.75  LATICACIDVEN >11.0* >11.0* 10.9* 7.6*  --   --     Recent Results (from the past 240 hour(s))  Blood culture (routine x 2)     Status: None   Collection Time: 12/03/2019  3:49 PM   Specimen: BLOOD  Result Value Ref Range Status   Specimen Description BLOOD BLOOD RIGHT HAND  Final   Special Requests   Final    BOTTLES DRAWN AEROBIC AND ANAEROBIC Blood Culture adequate volume   Culture   Final    NO GROWTH 5 DAYS Performed at [facility], [address] [place], [place] [address]    Report Status 11/26/2019 FINAL  Final  Blood culture (routine x 2)     Status: None   Collection Time: 12/01/2019  4:40 PM   Specimen: BLOOD  Result Value Ref Range Status   Specimen Description BLOOD LEFT SHOULDER  Final   Special Requests   Final    BOTTLES DRAWN AEROBIC AND ANAEROBIC Blood Culture adequate volume   Culture   Final    NO GROWTH 5 DAYS Performed at [facility], [address] [place], [place] [address]    Report Status 11/26/2019 FINAL  Final  Respiratory Panel by RT PCR (Flu A&B, Covid) - Nasopharyngeal Swab     Status: None   Collection Time: 11/05/2019  8:45 PM   Specimen: Nasopharyngeal Swab  Result Value Ref Range Status   SARS Coronavirus 2 by RT PCR NEGATIVE NEGATIVE Final    Comment: (NOTE) SARS-CoV-2 target nucleic acids are NOT DETECTED.  The SARS-CoV-2 RNA is generally detectable in upper respiratoy specimens during the acute phase of infection. The lowest concentration of SARS-CoV-2 viral copies this assay can detect is 131 copies/mL. A negative result does not preclude SARS-Cov-2 infection and should not be used as the sole  basis for treatment or other patient management decisions. A negative result may occur with  improper specimen collection/handling, submission of specimen other than nasopharyngeal swab, presence of viral mutation(s) within the areas targeted by this assay, and inadequate number of viral copies (<131 copies/mL). A negative result must be combined with clinical observations, patient history, and epidemiological information. The expected result is Negative.  Fact Sheet for Patients:  https://www.moore.com/  Fact Sheet for Healthcare Providers:  https://www.young.biz/  This test is no t yet approved or cleared by the [place] FDA and  has been authorized for detection and/or diagnosis of SARS-CoV-2 by FDA under an  Emergency Use Authorization (EUA). This EUA will remain  in effect (meaning this test can be used) for the duration of the COVID-19 declaration under Section 564(b)(1) of the Act, 21 U.S.C. section 360bbb-3(b)(1), unless the authorization is terminated or revoked sooner.     Influenza A by PCR NEGATIVE NEGATIVE Final   Influenza B by PCR NEGATIVE NEGATIVE Final    Comment: (NOTE) The Xpert Xpress SARS-CoV-2/FLU/RSV assay is intended as an aid in  the diagnosis of influenza from Nasopharyngeal swab specimens and  should not be used as a sole basis for treatment. Nasal washings and  aspirates are unacceptable for Xpert Xpress SARS-CoV-2/FLU/RSV  testing.  Fact Sheet for Patients: https://www.moore.com/  Fact Sheet for Healthcare Providers: https://www.young.biz/  This test is not yet approved or cleared by the [place] FDA and  has been authorized for detection and/or diagnosis of SARS-CoV-2 by  FDA under an Emergency Use Authorization (EUA). This EUA will remain  in effect (meaning this test can be used) for the duration of the  Covid-19 declaration under Section 564(b)(1) of the Act, 21  U.S.C. section 360bbb-3(b)(1), unless the authorization is  terminated or revoked. Performed at [facility], [address] [place], [place] [address]   MRSA PCR Screening     Status: None   Collection Time: 11/22/19 12:40 AM   Specimen: Nasal Mucosa; Nasopharyngeal  Result Value Ref Range Status   MRSA by PCR NEGATIVE NEGATIVE Final    Comment:        The GeneXpert MRSA Assay (FDA approved for NASAL specimens only), is one component of a comprehensive MRSA colonization surveillance program. It is not intended to diagnose MRSA infection nor to guide or monitor treatment for MRSA infections. Performed at [facility], [address], [place], [place] [address]   Urine Culture     Status: Abnormal   Collection Time:  11/22/19  1:50 AM   Specimen: Urine, Random  Result Value Ref Range Status   Specimen Description   Final    URINE, RANDOM Performed at [facility], [address] [place], [place] [address]    Special Requests   Final    NONE Performed at [facility], [address] [place], [place] [address]    Culture MULTIPLE SPECIES PRESENT, SUGGEST RECOLLECTION (A)  Final   Report Status 11/23/2019 FINAL  Final         Radiology Studies: No results found.      Scheduled Meds: . amoxicillin-clavulanate  800 mg Oral Q12H  . Chlorhexidine Gluconate Cloth  6 each Topical Daily  . folic acid  1 mg Oral Daily  . heparin  5,000 Units Subcutaneous Q8H  . lactulose  30 g Oral QID  . mouth rinse  15 mL Mouth Rinse BID  . oxcarbazepine  600 mg Oral BID  . pantoprazole (PROTONIX)  IV  40 mg Intravenous Q24H  . rifaximin  550 mg Oral BID  . sodium chloride flush  10-40 mL Intracatheter Q12H  . spironolactone  25 mg Oral Daily  . thiamine  100 mg Oral Daily   Or  . thiamine  100 mg Intravenous Daily   Continuous Infusions: . dextrose 5 % and 0.9 % NaCl with KCl 20 mEq/L 75 mL/hr at 11/26/19 1044  . potassium chloride 10 mEq (11/27/19 0932)     LOS: 6 days    Time spent: 45 minutes, more than 50% of time spent on direct patient care.    Marrion Coy, MD [facility]   To contact the attending provider between 7A-7P or the covering provider during after hours 7P-7A, please log into the web site [email or web address] and access using universal [place] password for that web site. If you do not have the password, please call the hospital operator.  11/27/2019, 9:58 AM

## 2019-11-27 NOTE — Progress Notes (Signed)
Made MD aware I held all am po meds as pt is too lethargic to swallow po intake safely in my nursing judgement

## 2019-11-27 NOTE — Consult Note (Signed)
PHARMACY CONSULT NOTE - FOLLOW UP  Pharmacy Consult for Electrolyte Monitoring and Replacement   Recent Labs: Potassium (mmol/L)  Date Value  11/27/2019 3.3 (L)   Magnesium (mg/dL)  Date Value  93/57/0177 2.1   Calcium (mg/dL)  Date Value  93/90/3009 7.9 (L)   Albumin (g/dL)  Date Value  23/30/0762 2.1 (L)   Phosphorus (mg/dL)  Date Value  26/33/3545 UNABLE TO REPORT DUE TO ICTERUS   Sodium (mmol/L)  Date Value  11/27/2019 136     Assessment: 39yo female with PMH bipolar disorder, HTN, alcohol abuse with alcoholic hepatitis who came to ED c/o seizure occurring ~0900 10/18. Patient reports drinking her usual amount including 5 shots. Patient speech noted to be slow and deliberate. Skin and eyes noted to have yellow tone. Pharmacy has been consulted for monitoring electrolytes.   On D5 NS w/ KCl 20 mEq @75  mL/hr.    Goal of Therapy:  WNL  Plan:  Medical team ordered KCl 10 mEq IV x 2. Recheck electrolytes with morning labs.  , PharmD, BCPS Clinical Pharmacist 11/27/2019 9:32 AM

## 2019-11-27 NOTE — Progress Notes (Signed)
   11/27/19 0823  Assess: MEWS Score  Temp 97.6 F (36.4 C)  BP (!) 63/17  Pulse Rate 69  Resp 16  Level of Consciousness Alert  SpO2 90 %  O2 Device Room Air  Patient Activity (if Appropriate) In bed  Assess: MEWS Score  MEWS Temp 0  MEWS Systolic 3  MEWS Pulse 0  MEWS RR 0  MEWS LOC 0  MEWS Score 3  MEWS Score Color Yellow  Assess: if the MEWS score is Yellow or Red  Were vital signs taken at a resting state? Yes  Focused Assessment No change from prior assessment  Early Detection of Sepsis Score *See Row Information* Low  MEWS guidelines implemented *See Row Information* Yes  Take Vital Signs  Increase Vital Sign Frequency  Yellow: Q 2hr X 2 then Q 4hr X 2, if remains yellow, continue Q 4hrs  Escalate  MEWS: Escalate Yellow: discuss with charge nurse/RN and consider discussing with provider and RRT  Notify: Charge Nurse/RN  Name of Charge Nurse/RN Notified robin  Date Charge Nurse/RN Notified 11/27/19  Time Charge Nurse/RN Notified [id number]  Notify: Provider  Provider Name/Title Dr Chipper Herb  Date Provider Notified 11/27/19  Time Provider Notified [phone]  Notification Type Page  Notification Reason Other (Comment) (63/17)  Response Other (Comment)  Date of Provider Response  (on his way to the floor)  Time of Provider Response [phone]  Document  Patient Outcome Stabilized after interventions  Progress note created (see row info) Yes  yellow mews notified robin rn charge nurse and dr Chipper Herb, primary

## 2019-11-27 NOTE — Consult Note (Signed)
Cephas Darby, MD [address]  [place], [place] [address]  Main: [phone]  Fax: [phone] Pager: [phone]   Consultation  Referring Provider:     No ref. provider found Primary Care Physician:  [facility] [place] Primary Gastroenterologist: Sherri Sear         Reason for Consultation:     Alcoholic hepatitis  Date of Admission:  11/25/2019 Date of Consultation:  11/27/2019         HPI:   Ashlee Hawkins is a 39 y.o. female alcohol abuse, seizure disorder who is admitted on 10/18 to ICU secondary to seizures from medication noncompliance (off antiseizure medications for 2 weeks), active alcohol abuse, severe hyponatremia, fever, severe alcoholic hepatitis.  Patient was treated with hypertonic saline with improvement in hyponatremia as well as empiric antibiotics.  Patient was admitted to [facility] 10/2019 secondary to alcoholic hepatitis and seizure-like activity.  She was started on antiepileptic medication at that time.  She also had hepatic encephalopathy, being treated with lactulose. Labs on admission were Na+108, K+ <2.0, Cl <65, Total Bili 20.1, AST 313, ALT 66, Ammonia 106, WBC 11.9, Lactic >11.  Patient is currently transferred out of ICU and GI is consulted for management of alcoholic hepatitis, assess the need for steroids.  Labs from today reveal mild leukocytosis 12.2, severe macrocytic anemia 8.3 hemoglobin, MCV 114.3, mild thrombocytopenia, 129. LFTs reveal alkaline phosphatase 449, albumin 2.1, AST 204, ALT 58, total bilirubin 24.8, direct bilirubin 14.9.  She has been hemodynamically stable on the floor  When I went to see the patient in her room, she was lying in bed and comfortable, appeared confused, but awake.  Did not answer my questions.  She was moving around in her bed.  NSAIDs: None  Antiplts/Anticoagulants/Anti thrombotics: None  GI Procedures: None  Past Medical History:  Diagnosis Date  . Alcoholism ([place])   .  Bipolar 1 disorder ([place])   . Hypertension   . Liver disease   . PTSD (post-traumatic stress disorder)   . Seizures ([place])     Past Surgical History:  Procedure Laterality Date  . CHOLECYSTECTOMY      Prior to Admission medications   Medication Sig Start Date End Date Taking? Authorizing Provider  amLODipine (NORVASC) 5 MG tablet Take 1 tablet (5 mg total) by mouth daily. 10/17/19 11/25/2019 Yes Sheikh, Omair Latif, DO  Docusate Sodium (DSS) 100 MG CAPS Take 100 mg by mouth daily.    Yes [provider]  folic acid (FOLVITE) 1 MG tablet Take 1 tablet (1 mg total) by mouth daily. 10/18/19  Yes Sheikh, Omair Latif, DO  gabapentin (NEURONTIN) 100 MG capsule Take 1 capsule (100 mg total) by mouth 3 (three) times daily. 10/17/19  Yes Sheikh, Omair Latif, DO  hydrOXYzine (ATARAX/VISTARIL) 50 MG tablet Take 50 mg by mouth 3 (three) times daily as needed.   Yes [provider]  lisinopril (ZESTRIL) 10 MG tablet Take 1 tablet (10 mg total) by mouth daily. 10/18/19  Yes Sheikh, Omair Latif, DO  Melatonin 3 MG TABS Take 3 mg by mouth at bedtime.   Yes [provider]  Multiple Vitamin (MULTI-VITAMIN) tablet Take 1 tablet by mouth daily.   Yes [provider]  omeprazole (PRILOSEC) 20 MG capsule Take 20 mg by mouth daily.    Yes [provider]  ondansetron (ZOFRAN) 4 MG tablet Take 1 tablet (4 mg total) by mouth every 6 (six) hours as needed for  nausea. 10/17/19  Yes Sheikh, Omair Latif, DO  oxcarbazepine (TRILEPTAL) 600 MG tablet Take 1 tablet (600 mg total) by mouth 2 (two) times daily. 10/17/19  Yes Sheikh, Omair Latif, DO  rifaximin (XIFAXAN) 550 MG TABS tablet Take 1 tablet (550 mg total) by mouth 2 (two) times daily. 10/17/19  Yes Sheikh, Omair Latif, DO  sertraline (ZOLOFT) 50 MG tablet Take 1 tablet (50 mg total) by mouth daily. 10/18/19  Yes Sheikh, Omair Latif, DO  thiamine 100 MG tablet Take 100 mg by mouth daily.    Yes [provider]  zinc  gluconate 50 MG tablet Take 50 mg by mouth daily.    Yes [provider]  clotrimazole (LOTRIMIN) 1 % external solution Apply 1 application topically 2 (two) times daily.    [provider]  diclofenac sodium (VOLTAREN) 1 % GEL Apply 2 g topically 4 (four) times daily as needed (pain).    [provider]  diphenhydrAMINE (BENADRYL) 25 mg capsule Take 25 mg by mouth every 6 (six) hours as needed for itching or allergies.  05/28/16   [provider]  feeding supplement, ENSURE ENLIVE, (ENSURE ENLIVE) LIQD Take 237 mLs by mouth 2 (two) times daily between meals. 10/17/19   Raiford Noble Latif, DO  lactulose (CHRONULAC) 10 GM/15ML solution Take 30 mLs (20 g total) by mouth 2 (two) times daily as needed for mild constipation. 10/17/19   Raiford Noble Latif, DO    Current Facility-Administered Medications:  .  amoxicillin-clavulanate (AUGMENTIN) 400-57 MG/5ML suspension 800 mg, 800 mg, Oral, Q12H, Sharen Hones, MD, 800 mg at 11/27/19 0006 .  Chlorhexidine Gluconate Cloth 2 % PADS 6 each, 6 each, Topical, Daily, Flora Lipps, MD, 6 each at 11/27/19 1021 .  dextrose 5 % and 0.9 % NaCl with KCl 20 mEq/L infusion, , Intravenous, Continuous, Sharen Hones, MD, Last Rate: 75 mL/hr at 11/26/19 1044, New Bag at 11/26/19 1044 .  folic acid (FOLVITE) tablet 1 mg, 1 mg, Oral, Daily, Rust-Chester, Britton L, NP, 1 mg at 11/26/19 1043 .  heparin injection 5,000 Units, 5,000 Units, Subcutaneous, Q8H, Rust-Chester, Britton L, NP, 5,000 Units at 11/27/19 2376 .  ipratropium-albuterol (DUONEB) 0.5-2.5 (3) MG/3ML nebulizer solution 3 mL, 3 mL, Nebulization, Q6H PRN, Roosevelt Locks, Dekui, MD .  lactulose (CHRONULAC) 10 GM/15ML solution 30 g, 30 g, Oral, BID, Chrysta Fulcher, Tally Due, MD .  MEDLINE mouth rinse, 15 mL, Mouth Rinse, BID, Mortimer Fries, Kurian, MD, 15 mL at 11/26/19 2320 .  multivitamin-lutein (OCUVITE-LUTEIN) capsule 1 capsule, 1 capsule, Oral, Daily, Lance Galas, Tally Due, MD .  ondansetron [facility])  injection 4 mg, 4 mg, Intravenous, Q6H PRN, Rust-Chester, Huel Cote, NP .  Oxcarbazepine (TRILEPTAL) tablet 600 mg, 600 mg, Oral, BID, Rust-Chester, Britton L, NP, 600 mg at 11/26/19 2320 .  pantoprazole (PROTONIX) injection 40 mg, 40 mg, Intravenous, Q24H, Rust-Chester, Britton L, NP, 40 mg at 11/27/19 0406 .  polyethylene glycol (MIRALAX / GLYCOLAX) packet 17 g, 17 g, Oral, Daily PRN, Rust-Chester, Britton L, NP .  rifaximin (XIFAXAN) tablet 550 mg, 550 mg, Oral, BID, Rust-Chester, Britton L, NP, 550 mg at 11/26/19 2319 .  sodium chloride flush (NS) 0.9 % injection 10-40 mL, 10-40 mL, Intracatheter, Q12H, Kasa, Kurian, MD, 10 mL at 11/26/19 2320 .  sodium chloride flush (NS) 0.9 % injection 10-40 mL, 10-40 mL, Intracatheter, PRN, Flora Lipps, MD .  spironolactone (ALDACTONE) tablet 25 mg, 25 mg, Oral, Daily, Candiss Norse, Harmeet, MD, 25 mg at 11/26/19 1043 .  thiamine tablet 100  mg, 100 mg, Oral, Daily, 100 mg at 11/25/19 0948 **OR** thiamine (B-1) injection 100 mg, 100 mg, Intravenous, Daily, Lucrezia Starch, MD, 100 mg at 11/26/19 1044 .  white petrolatum (VASELINE) gel, , Topical, PRN, Rust-Chester, Huel Cote, NP, 1 application at 48/54/62 0351  Family History  Problem Relation Age of Onset  . Heart failure Mother      Social History   Tobacco Use  . Smoking status: Current Every Day Smoker    Packs/day: 1.00    Types: Cigarettes  . Smokeless tobacco: Never Used  Vaping Use  . Vaping Use: Never used  Substance Use Topics  . Alcohol use: Yes    Alcohol/week: 36.0 standard drinks    Types: 36 Shots of liquor per week  . Drug use: Not Currently    Types: Cocaine, Marijuana    Allergies as of 11/28/2019  . (No Known Allergies)    Review of Systems:    All systems reviewed and negative except where noted in HPI.   Physical Exam:  Vital signs in last 24 hours: Temp:  [97.5 F (36.4 C)-97.7 F (36.5 C)] 97.5 F (36.4 C) (10/24 1236) Pulse Rate:  [69-103] 101 (10/24  1236) Resp:  [15-18] 18 (10/24 1236) BP: (103-130)/(71-99) 109/74 (10/24 1236) SpO2:  [90 %-99 %] 95 % (10/24 1236) Last BM Date: 11/27/19 General: Alert, confused, NAD Head:  Normocephalic and atraumatic. Eyes:   No icterus.   Conjunctiva pink. PERRLA. Ears:  Normal auditory acuity. Neck:  Supple; no masses or thyroidomegaly Lungs: Respirations even and unlabored. Lungs clear to auscultation bilaterally.   No wheezes, crackles, or rhonchi.  Heart:  Regular rate and rhythm;  Without murmur, clicks, rubs or gallops Abdomen:  Soft, distended, tympanic to percussion, nontender. Normal bowel sounds. No appreciable masses or hepatomegaly.  No rebound or guarding.  Rectal:  Not performed. Msk:  Symmetrical without gross deformities.  Strength unable to assess Extremities: Trace edema, no cyanosis or clubbing. Neurologic:  Alert and oriented x0;   Skin:  Intact without significant lesions or rashes.  LAB RESULTS: CBC Latest Ref Rng & Units 11/27/2019 11/26/2019 11/25/2019  WBC 4.0 - 10.5 K/uL 12.2(H) 12.1(H) 9.9  Hemoglobin 12.0 - 15.0 g/dL 8.3(L) 8.4(L) 8.6(L)  Hematocrit 36 - 46 % 25.5(L) 25.2(L) 25.6(L)  Platelets 150 - 400 K/uL 129(L) 148(L) 188    BMET BMP Latest Ref Rng & Units 11/27/2019 11/26/2019 11/25/2019  Glucose 70 - 99 mg/dL 105(H) 112(H) -  BUN 6 - 20 mg/dL <5(L) <5(L) -  Creatinine 0.44 - 1.00 mg/dL UNABLE TO REPORT DUE TO ICTERUS UNABLE TO REPORT DUE TO ICTERUS.PMF -  Sodium 135 - 145 mmol/L 136 132(L) 132(L)  Potassium 3.5 - 5.1 mmol/L 3.3(L) 3.4(L) -  Chloride 98 - 111 mmol/L 100 95(L) -  CO2 22 - 32 mmol/L 26 28 -  Calcium 8.9 - 10.3 mg/dL 7.9(L) 8.0(L) -    LFT Hepatic Function Latest Ref Rng & Units 11/27/2019 11/26/2019 11/25/2019  Total Protein 6.5 - 8.1 g/dL 5.5(L) 5.5(L) 5.7(L)  Albumin 3.5 - 5.0 g/dL 2.1(L) 2.1(L) 2.2(L)  AST 15 - 41 U/L 204(H) 232(H) 296(H)  ALT 0 - 44 U/L 58(H) 62(H) 73(H)  Alk Phosphatase 38 - 126 U/L 449(H) 426(H) 443(H)  Total  Bilirubin 0.3 - 1.2 mg/dL 24.8(HH) 23.3(HH) 24.0(HH)  Bilirubin, Direct 0.0 - 0.2 mg/dL 14.9(H) - -     STUDIES: No results found.    Impression / Plan:   Ashlee Hawkins is a  39 y.o. female with history of alcohol abuse, PTSD, bipolar, seizure disorder, hypertension is admitted with seizures, severe hyponatremia and alcoholic hepatitis.  Hyponatremia has been resolved.  Severe alcoholic hepatitis with ALF: Moderate discriminant function is 28.9, this is based on PT/INR from admission Recommend to check PT/INR today to calculate the current score before deciding on the steroid use.  Prednisone is indicated if the score is greater than 32 Continue rifaximin 550 twice daily and lactulose to treat encephalopathy Recommend to adjust lactulose dose in order to prevent diarrhea and dehydration which can lead to worsening of renal function No evidence of active GI bleed Recommend ultrasound with Dopplers Low-sodium diet Monitor daily LFTs and electrolytes closely Avoid hepatotoxic agents Multivitamin plus thiamine plus folate daily High-protein diet CIWA protocol Agree with palliative care as long-term prognosis is guarded given active alcoholism  Thank you for involving me in the care of this patient.      LOS: 6 days   Sherri Sear, MD  11/27/2019, 1:26 PM   Note: This dictation was prepared with Dragon dictation along with smaller phrase technology. Any transcriptional errors that result from this process are unintentional.

## 2019-11-28 DIAGNOSIS — E871 Hypo-osmolality and hyponatremia: Secondary | ICD-10-CM | POA: Diagnosis not present

## 2019-11-28 DIAGNOSIS — K7031 Alcoholic cirrhosis of liver with ascites: Secondary | ICD-10-CM | POA: Diagnosis not present

## 2019-11-28 DIAGNOSIS — K7290 Hepatic failure, unspecified without coma: Secondary | ICD-10-CM | POA: Diagnosis not present

## 2019-11-28 LAB — BASIC METABOLIC PANEL
Anion gap: 10 (ref 5–15)
BUN: <5 mg/dL — ABNORMAL LOW (ref 6–20)
CO2: 22 mmol/L (ref 22–32)
Calcium: 8.1 mg/dL — ABNORMAL LOW (ref 8.9–10.3)
Chloride: 104 mmol/L (ref 98–111)
Creatinine, Ser: UNDETERMINED mg/dL (ref 0.44–1.00)
Glucose, Bld: 95 mg/dL (ref 70–99)
Potassium: 3.4 mmol/L — ABNORMAL LOW (ref 3.5–5.1)
Sodium: 136 mmol/L (ref 135–145)

## 2019-11-28 LAB — HEPATIC FUNCTION PANEL
ALT: 55 U/L — ABNORMAL HIGH (ref 0–44)
AST: 180 U/L — ABNORMAL HIGH (ref 15–41)
Albumin: 2.2 g/dL — ABNORMAL LOW (ref 3.5–5.0)
Alkaline Phosphatase: 433 U/L — ABNORMAL HIGH (ref 38–126)
Bilirubin, Direct: 15.2 mg/dL — ABNORMAL HIGH (ref 0.0–0.2)
Indirect Bilirubin: 11.6 mg/dL — ABNORMAL HIGH (ref 0.3–0.9)
Total Bilirubin: 26.8 mg/dL (ref 0.3–1.2)
Total Protein: 5.7 g/dL — ABNORMAL LOW (ref 6.5–8.1)

## 2019-11-28 LAB — GLUCOSE, CAPILLARY
Glucose-Capillary: 86 mg/dL (ref 70–99)
Glucose-Capillary: 117 mg/dL — ABNORMAL HIGH (ref 70–99)
Glucose-Capillary: 134 mg/dL — ABNORMAL HIGH (ref 70–99)
Glucose-Capillary: 109 mg/dL — ABNORMAL HIGH (ref 70–99)

## 2019-11-28 LAB — MAGNESIUM: Magnesium: 2.1 mg/dL (ref 1.7–2.4)

## 2019-11-28 LAB — PHOSPHORUS: Phosphorus: UNDETERMINED mg/dL (ref 2.5–4.6)

## 2019-11-28 LAB — AMMONIA: Ammonia: 103 umol/L — ABNORMAL HIGH (ref 9–35)

## 2019-11-28 MED ORDER — POTASSIUM CHLORIDE 10 MEQ/100ML IV SOLN
10.0000 meq | INTRAVENOUS | Status: AC
  Administered 2019-11-28 (×2): 10 meq via INTRAVENOUS
  Filled 2019-11-28 (×2): qty 100

## 2019-11-28 MED ORDER — GADOBUTROL 1 MMOL/ML IV SOLN
7.5000 mL | Freq: Once | INTRAVENOUS | Status: AC | PRN
  Administered 2019-11-28: 10 mL via INTRAVENOUS

## 2019-11-28 NOTE — Consult Note (Signed)
PHARMACY CONSULT NOTE - FOLLOW UP  Pharmacy Consult for Electrolyte Monitoring and Replacement   Recent Labs: Potassium (mmol/L)  Date Value  11/28/2019 3.4 (L)   Magnesium (mg/dL)  Date Value  02/05/7251 2.1   Calcium (mg/dL)  Date Value  66/44/0347 8.1 (L)   Albumin (g/dL)  Date Value  42/59/5638 2.2 (L)   Phosphorus (mg/dL)  Date Value  75/64/3329 UNABLE TO REPORT DUE TO ICTERIC INTERFERENCE SDR   Sodium (mmol/L)  Date Value  11/28/2019 136     Assessment: 39yo female with PMH bipolar disorder, HTN, alcohol abuse with alcoholic hepatitis who came to ED c/o seizure occurring ~0900 10/18. Patient reports drinking her usual amount including 5 shots. Patient speech noted to be slow and deliberate. Skin and eyes noted to have yellow tone. Pharmacy has been consulted for monitoring electrolytes.   On D5 NS w/ KCl 20 mEq @75  mL/hr.    Goal of Therapy:  WNL  Plan:  Medical team ordered KCl 10 mEq IV x 2. Recheck electrolytes with morning labs.  , PharmD, BCPS Clinical Pharmacist 11/28/2019 8:14 AM

## 2019-11-28 NOTE — Evaluation (Signed)
Occupational Therapy Evaluation Patient Details Name: Ashlee Hawkins MRN: [id number] DOB: 10/14/1980 Today's Date: 11/28/2019    History of Present Illness Pt is a 39 y.o. female presenting to hospital 10/18 with seizure that morning and LUQ abdominal pain; multiple falls past 2 weeks.  Pt admitted with severe hyponatremia with reported seizure, alcoholic hepatitis, and sepsis.  PMH includes Bipolar disorder, htn, alcoholic abuse with alcoholic hepatitis, PTSD, seizures.   Clinical Impression   Patient presenting with decreased I in self care, balance, functional mobility/transfers, endurance, and safety awareness. Pt is very lethargic and closing eyes throughout session.  Patient reports being fully independent PTA and living with husband and son. Patient currently functioning at min - mod A bed level. Pt not transferred secondary to increased lethargy this session. Pt also mumbling words and with very broken speech. Patient will benefit from acute OT to increase overall independence in the areas of ADLs, functional mobility, and safety awareness in order to safely discharge to next venue of care.    Follow Up Recommendations  SNF;Supervision/Assistance - 24 hour    Equipment Recommendations  Other (comment) (defer to next venue of care)    Recommendations for Other Services Other (comment) (none at this time)     Precautions / Restrictions Precautions Precautions: Fall Precaution Comments: Seizure precautions; L midline, rectal tube Restrictions Weight Bearing Restrictions: No      Mobility Bed Mobility Overal bed mobility: Needs Assistance Bed Mobility: Supine to Sit;Rolling Rolling: Min assist   Supine to sit: HOB elevated;Mod assist     General bed mobility comments: max multimodal cuing    Transfers Overall transfer level: Needs assistance Equipment used: Rolling walker (2 wheeled) Transfers: Sit to/from Stand Sit to Stand: Min assist;+2 physical assistance          General transfer comment: not performed secondary to safety with lethargy    Balance Overall balance assessment: Needs assistance Sitting-balance support: Feet supported;Single extremity supported Sitting balance-Leahy Scale: Poor Sitting balance - Comments: pt shaky in general sitting edge of bed requiring at least single UE support for static sitting balance and CGA from therapist for safety   Standing balance support: Bilateral upper extremity supported Standing balance-Leahy Scale: Poor Standing balance comment: pt requiring B UE support on RW and CGA x2 of therapy for safety in standing (d/t pt shaky)        ADL either performed or assessed with clinical judgement   ADL Overall ADL's : Needs assistance/impaired     Grooming: Wash/dry hands;Wash/dry face;Oral care;Set up;Sitting     Lower Body Dressing: Total assistance;Bed level    General ADL Comments: Transfers not performed secondary to safety concerns with lethargy. Pt needing total A for all aspects of care this session from bed level.     Vision Patient Visual Report: No change from baseline              Pertinent Vitals/Pain Pain Assessment: Faces Pain Score: 0-No pain Faces Pain Scale: No hurt Pain Intervention(s): Limited activity within patient's tolerance;Monitored during session;Repositioned     Hand Dominance Right   Extremity/Trunk Assessment Upper Extremity Assessment Upper Extremity Assessment: Generalized weakness;LUE deficits/detail LUE Deficits / Details: Pt having difficulty following commands to assess but it appears weaker than R UE   Lower Extremity Assessment Lower Extremity Assessment: Generalized weakness   Cervical / Trunk Assessment Cervical / Trunk Assessment: Normal   Communication Communication Communication: Other (comment) (speech slurred at times and lethargic)   Cognition Arousal/Alertness: Lethargic Behavior During  Therapy: Flat affect Overall Cognitive Status:  No family/caregiver present to determine baseline cognitive functioning     General Comments: Oriented to person and place. Pt needing increased time to initiate tasks, sequence, and slow processing of all information.              Home Living Family/patient expects to be discharged to:: Private residence Living Arrangements: Spouse/significant other;Children Available Help at Discharge: Family Type of Home: House Home Access: Stairs to enter Secretary/administrator of Steps: 4 Entrance Stairs-Rails: None Home Layout: Two level Alternate Level Stairs-Number of Steps: 13 steps in the house       Bathroom Toilet: Standard     Home Equipment: None          Prior Functioning/Environment Level of Independence: Independent        Comments: Likes to play football with her 58 y.o. son        OT Problem List: Decreased strength;Decreased coordination;Decreased safety awareness;Decreased activity tolerance;Decreased knowledge of use of DME or AE;Impaired balance (sitting and/or standing);Decreased cognition;Impaired UE functional use      OT Treatment/Interventions: Self-care/ADL training;Therapeutic exercise;Therapeutic activities;Neuromuscular education;Energy conservation;DME and/or AE instruction;Patient/family education;Balance training;Manual therapy    OT Goals(Current goals can be found in the care plan section) Acute Rehab OT Goals Patient Stated Goal: pt unable to verbalize OT Goal Formulation: Patient unable to participate in goal setting Time For Goal Achievement: 12/12/19 Potential to Achieve Goals: Fair ADL Goals Pt Will Perform Grooming: with supervision;standing Pt Will Transfer to Toilet: with min guard assist Pt Will Perform Toileting - Clothing Manipulation and hygiene: with min guard assist  OT Frequency: Min 1X/week   Barriers to D/C: Decreased caregiver support  none at this time          AM-PAC OT "6 Clicks" Daily Activity     Outcome  Measure Help from another person eating meals?: None Help from another person taking care of personal grooming?: A Little Help from another person toileting, which includes using toliet, bedpan, or urinal?: Total Help from another person bathing (including washing, rinsing, drying)?: A Lot Help from another person to put on and taking off regular upper body clothing?: A Little Help from another person to put on and taking off regular lower body clothing?: A Lot 6 Click Score: 15   End of Session    Activity Tolerance: Patient limited by lethargy Patient left: in bed;with call bell/phone within reach;with bed alarm set  OT Visit Diagnosis: Muscle weakness (generalized) (M62.81);Unsteadiness on feet (R26.81)                Time: 1340-1355 OT Time Calculation (min): 15 min Charges:  OT General Charges $OT Visit: 1 Visit OT Evaluation $OT Eval Moderate Complexity: 1 [address] [place], OTR/L , CBIS ascom [phone]  11/28/19, 2:22 PM

## 2019-11-28 NOTE — Progress Notes (Signed)
Attempted call to patient's husband to complete Readmission Screening and discuss PT recommendation for SNF. Left a voicemail requesting a return call and will continue to follow.   Alfonso Ramus, [place] [phone]

## 2019-11-28 NOTE — [place]2 (Signed)
[place] MEDICAID FL2 LEVEL OF CARE SCREENING TOOL     IDENTIFICATION  Patient Name: ANJU SERENO Birthdate: [birth year] Sex: female Admission Date (Current Location): 11/18/2019  [place] and [place] Number:  Chiropodist and Address:  [facility], [address], [place], [place] [address]      Provider Number: [id number]  Attending Physician Name and Address:  Marrion Coy, MD  Relative Name and Phone Number:       Current Level of Care: Hospital Recommended Level of Care: Skilled Nursing Facility Prior Approval Number:    Date Approved/Denied:   PASRR Number: [id number]  Discharge Plan: SNF    Current Diagnoses: Patient Active Problem List   Diagnosis Date Noted  . Seizure (HCC) 11/26/2019  . DNR (do not resuscitate)   . Goals of care, counseling/discussion   . Palliative care by specialist   . Alcoholic cirrhosis of liver with ascites (HCC)   . Hepatic encephalopathy (HCC)   . Hyponatremia with decreased serum osmolality 11/14/2019  . Alcoholic hepatitis 10/12/2019  . Hyponatremia 10/12/2019    Orientation RESPIRATION BLADDER Height & Weight     Self  Normal Incontinent, External catheter Weight: 199 lb 11.8 oz (90.6 kg) Height:  5\' 3"  (160 cm)  BEHAVIORAL SYMPTOMS/MOOD NEUROLOGICAL BOWEL NUTRITION STATUS  Other (Comment) (Flat affect) Convulsions/Seizures Incontinent (Currently has a rectal tube which will be discontinued prior to discharge.) Diet  AMBULATORY STATUS COMMUNICATION OF NEEDS Skin   Limited Assist Verbally Other (Comment) (Rash.)                       Personal Care Assistance Level of Assistance  Bathing, Feeding, Dressing Bathing Assistance: Maximum assistance Feeding assistance: Limited assistance Dressing Assistance: Maximum assistance     Functional Limitations Info  Sight, Hearing, Speech Sight Info: Adequate Hearing Info: Adequate Speech Info: Adequate (Delayed responses)    SPECIAL  CARE FACTORS FREQUENCY  PT (By licensed PT), OT (By licensed OT)     PT Frequency: 5 x week OT Frequency: 5 x week            Contractures Contractures Info: Not present    Additional Factors Info  Code Status, Allergies Code Status Info: DNR Allergies Info: NKDA           Current Medications (11/28/2019):  This is the current hospital active medication list Current Facility-Administered Medications  Medication Dose Route Frequency Provider Last Rate Last Admin  . amoxicillin-clavulanate (AUGMENTIN) 400-57 MG/5ML suspension 800 mg  800 mg Oral Q12H 11/30/2019, MD   800 mg at 11/28/19 1105  . Chlorhexidine Gluconate Cloth 2 % PADS 6 each  6 each Topical Daily 11/30/19, MD   6 each at 11/28/19 1105  . dextrose 5 % and 0.9 % NaCl with KCl 20 mEq/L infusion   Intravenous Continuous 11/30/19, MD   Stopped at 11/27/19 2335  . folic acid (FOLVITE) tablet 1 mg  1 mg Oral Daily Rust-Chester, Britton L, NP   1 mg at 11/28/19 1019  . heparin injection 5,000 Units  5,000 Units Subcutaneous Q8H Rust-Chester, Britton L, NP   5,000 Units at 11/28/19 1445  . ipratropium-albuterol (DUONEB) 0.5-2.5 (3) MG/3ML nebulizer solution 3 mL  3 mL Nebulization Q6H PRN 11/30/19, MD      . lactulose (CHRONULAC) 10 GM/15ML solution 30 g  30 g Oral BID Marrion Coy, MD   30 g at 11/28/19 1020  . MEDLINE mouth rinse  15 mL Mouth Rinse BID Erin Fulling, MD   15 mL at 11/28/19 1106  . methylPREDNISolone sodium succinate (SOLU-MEDROL) 40 mg/mL injection 40 mg  40 mg Intravenous Daily Toney Reil, MD   40 mg at 11/28/19 1019  . multivitamin-lutein (OCUVITE-LUTEIN) capsule 1 capsule  1 capsule Oral Daily Vanga, Loel Dubonnet, MD   1 capsule at 11/28/19 1020  . ondansetron (ZOFRAN) injection 4 mg  4 mg Intravenous Q6H PRN Rust-Chester, Cecelia Byars, NP      . Oxcarbazepine (TRILEPTAL) tablet 600 mg  600 mg Oral BID Rust-Chester, Britton L, NP   600 mg at 11/28/19 1028  . pantoprazole  (PROTONIX) injection 40 mg  40 mg Intravenous Q24H Rust-Chester, Britton L, NP   40 mg at 11/28/19 0337  . polyethylene glycol (MIRALAX / GLYCOLAX) packet 17 g  17 g Oral Daily PRN Rust-Chester, Britton L, NP      . rifaximin (XIFAXAN) tablet 550 mg  550 mg Oral BID Rust-Chester, Britton L, NP   550 mg at 11/28/19 1019  . sodium chloride flush (NS) 0.9 % injection 10-40 mL  10-40 mL Intracatheter Q12H Erin Fulling, MD   10 mL at 11/28/19 1029  . sodium chloride flush (NS) 0.9 % injection 10-40 mL  10-40 mL Intracatheter PRN Erin Fulling, MD      . spironolactone (ALDACTONE) tablet 25 mg  25 mg Oral Daily Mosetta Pigeon, MD   25 mg at 11/28/19 1029  . thiamine tablet 100 mg  100 mg Oral Daily Gilles Chiquito, MD   100 mg at 11/28/19 1020   Or  . thiamine (B-1) injection 100 mg  100 mg Intravenous Daily Gilles Chiquito, MD   100 mg at 11/26/19 1044  . white petrolatum (VASELINE) gel   Topical PRN Rust-Chester, Cecelia Byars, NP   1 application at 11/24/19 0351     Discharge Medications: Please see discharge summary for a list of discharge medications.  Relevant Imaging Results:  Relevant Lab Results:   Additional Information SS#: [id number]. 70% service-connected at the [facility].  Margarito Liner, LCSW

## 2019-11-28 NOTE — Progress Notes (Addendum)
PROGRESS NOTE    Ashlee Hawkins  FGH:[id number] DOB: 1980-11-29 DOA: 2019/12/11 PCP: [facility]   Chief complaint.  Altered mental status. Brief Narrative:  Patient is a 39 year old female with a history of alcohol abuse, seizure disorder and alcohol hepatitis who initially admitedto ICU on 10/18 with a severe hyponatremia with sodium 108, ammonia 106.She has been continuously drinking alcohol, last drink was before admission. She was also having seizures. Since admission to the ICU, sodium level has went up after giving 3% sodium chloride, she was ago also giving lactulose.She is very confused, mental status has not improved since admission. Her total bilirubin was 23.3. Ammonia level still elevated. Patient has been seen by palliative care, long-term prognosis is very poor.  10/23.  Increase the lactulose dose, started rehydration. 10/24.  Patient mental status seem to be improving today.  Still has very high bilirubin level, consult GI. 10/25.  Patient does not have portal vein thrombosis on MRV study.  Start IV steroids per GI.   Assessment & Plan:   Active Problems:   Hyponatremia with decreased serum osmolality   Alcoholic cirrhosis of liver with ascites (HCC)   Hepatic encephalopathy (HCC)   Goals of care, counseling/discussion   Palliative care by specialist   DNR (do not resuscitate)   Seizure (HCC)  1.  Hepatic encephalopathy. Patient still very confused, appreciate GI consult, continue IV fluids, lactulose and rifaximin.  #2.  Alcohol liver cirrhosis and ascites. Reviewed MRI results, patient does not have portal vein thrombosis.  Ultrasound did not show enough ascites to tap. Aldactone and steroids started by GI.  3.  Hyponatremia. Better.  4.  Severe protein calorie malnutrition. Continue supplement.  5.  Aspiration pneumonia. Continue oral Augmentin for another day.  6.  Hypokalemia. Continue supplement.  7.  Anemia of chronic  disease. Follow.    DVT prophylaxis: Heparin Code Status: DNR Family Communication: husband Disposition Plan:  .   Status is: Inpatient  Remains inpatient appropriate because:Inpatient level of care appropriate due to severity of illness   Dispo: The patient is from: Home              Anticipated d/c is to: Uncertain              Anticipated d/c date is: > 3 days              Patient currently is not medically stable to d/c.        I/O last 3 completed shifts: In: 1244.4 [I.V.:1244.4] Out: 1000 [Stool:1000] No intake/output data recorded.     Consultants:   GI  Procedures: None  Antimicrobials: None  Subjective: Patient slept through the night, still sleepy this morning.  But less confused. She still has a very poor appetite, but no nausea vomiting.  No abdominal pain. No fever or chills. No dysuria hematuria.  Objective: Vitals:   11/27/19 1953 11/27/19 2355 11/28/19 0447 11/28/19 0757  BP: 115/70 120/72 110/79 105/66  Pulse: 98 91 93 92  Resp: 20  20 19   Temp: 98.1 F (36.7 C) 98.2 F (36.8 C) 97.6 F (36.4 C) 97.6 F (36.4 C)  TempSrc: Oral Oral Oral Oral  SpO2: 97% 98% 98% 97%  Weight:      Height:        Intake/Output Summary (Last 24 hours) at 11/28/2019 0957 Last data filed at 11/28/2019 0300 Gross per 24 hour  Intake 1244.43 ml  Output --  Net 1244.43 ml   11/30/2019  Weights   11/23/19 0347 11/24/19 0351 11/26/19 0408  Weight: 89.7 kg 91.1 kg 90.6 kg    Examination:  General exam: Appears calm and comfortable  Respiratory system: Clear to auscultation. Respiratory effort normal. Cardiovascular system: S1 & S2 heard, RRR. No JVD, murmurs, rubs, gallops or clicks. No pedal edema. Gastrointestinal system: Abdomen is distended, soft and nontender.  Significant hepatomegaly. Normal bowel sounds heard. Central nervous system: Drowsy and oriented to place and person. No focal neurological deficits. Extremities: Symmetric  Skin: Severe  jaundice Psychiatry: Flat affect    Data Reviewed: I have personally reviewed following labs and imaging studies  CBC: Recent Labs  Lab 11/23/19 0341 11/24/19 0431 11/25/19 0030 11/26/19 0700 11/27/19 0413  WBC 8.7 8.5 9.9 12.1* 12.2*  NEUTROABS 6.7 6.1 6.5 7.9* 8.0*  HGB 8.4* 8.1* 8.6* 8.4* 8.3*  HCT 23.2* 23.5* 25.6* 25.2* 25.5*  MCV 104.0* 108.3* 110.8* 110.5* 114.3*  PLT 146* 173 188 148* 129*   Basic Metabolic Panel: Recent Labs  Lab 11/24/19 0431 11/24/19 0610 11/24/19 1650 11/24/19 2055 11/25/19 0030 11/25/19 0620 11/25/19 1350 11/25/19 1746 11/26/19 0700 11/27/19 0413 11/28/19 0456  NA 128*   < >  --    < > 131*  130*   < > 132* 132* 132* 136 136  K 2.9*   < > 3.4*  --  3.4*  3.4*  --   --   --  3.4* 3.3* 3.4*  CL 84*  --   --   --  92*  --   --   --  95* 100 104  CO2 33*  --   --   --  29  --   --   --  28 26 22   GLUCOSE 93  --   --   --  103*  --   --   --  112* 105* 95  BUN <5*  --   --   --  <5*  --   --   --  <5* <5* <5*  CREATININE <0.30*  --   --   --  UNABLE TO REPORT DUE TO ICTERUS  --   --   --  UNABLE TO REPORT DUE TO ICTERUS.PMF UNABLE TO REPORT DUE TO ICTERUS  UNABLE TO REPORT DUE TO ICTERIC INTERFERENCE SDR  CALCIUM 7.6*  --   --   --  7.9*  --   --   --  8.0* 7.9* 8.1*  MG 2.3  --   --   --  2.3  2.3  --   --   --  2.1 2.1 2.1  PHOS UNABLE TO REPORT DUE TO ICTERUS  --   --   --  UNABLE TO REPORT DUE TO ICTERUS  UNABLE TO REPORT DUE TO ICTERUS  --   --   --  UNABLE TO REPORT DUE TO ICTERUS.PMF UNABLE TO REPORT DUE TO ICTERUS UNABLE TO REPORT DUE TO ICTERIC INTERFERENCE SDR   < > = values in this interval not displayed.   GFR: CrCl cannot be calculated (This lab value cannot be used to calculate CrCl because it is not a number:  UNABLE TO REPORT DUE TO ICTERIC INTERFERENCE SDR). Liver Function Tests: Recent Labs  Lab 11/24/19 0956 11/25/19 0030 11/26/19 0700 11/27/19 0413 11/28/19 0456  AST 298* 296* 232* 204* 180*  ALT 64* 73* 62*  58* 55*  ALKPHOS 383* 443* 426* 449* 433*  BILITOT 21.6* 24.0* 23.3* 24.8* 26.8*  PROT 5.3* 5.7* 5.5*  5.5* 5.7*  ALBUMIN 2.1* 2.2* 2.1* 2.1* 2.2*   Recent Labs  Lab 11/24/19 0610  LIPASE 80*  AMYLASE 131*   Recent Labs  Lab Dec 18, 2019 1334 11/24/19 0611 11/27/19 0413 11/28/19 0456  AMMONIA 106* 68* 102* 103*   Coagulation Profile: Recent Labs  Lab 18-Dec-2019 1456 11/27/19 1218  INR 1.3* 1.8*   Cardiac Enzymes: No results for input(s): CKTOTAL, CKMB, CKMBINDEX, TROPONINI in the last 168 hours. BNP (last 3 results) No results for input(s): PROBNP in the last 8760 hours. HbA1C: No results for input(s): HGBA1C in the last 72 hours. CBG: Recent Labs  Lab 11/27/19 0820 11/27/19 1237 11/27/19 1556 11/27/19 1955 11/28/19 0758  GLUCAP 106* 88 100* 97 86   Lipid Profile: No results for input(s): CHOL, HDL, LDLCALC, TRIG, CHOLHDL, LDLDIRECT in the last 72 hours. Thyroid Function Tests: No results for input(s): TSH, T4TOTAL, FREET4, T3FREE, THYROIDAB in the last 72 hours. Anemia Panel: No results for input(s): VITAMINB12, FOLATE, FERRITIN, TIBC, IRON, RETICCTPCT in the last 72 hours. Sepsis Labs: Recent Labs  Lab 18-Dec-2019 1549 Dec 18, 2019 1804 12-18-19 2036 11/22/19 0048 11/23/19 0341 11/27/19 0413  PROCALCITON  --   --  1.20 1.38 1.47 0.75  LATICACIDVEN >11.0* >11.0* 10.9* 7.6*  --   --     Recent Results (from the past 240 hour(s))  Blood culture (routine x 2)     Status: None   Collection Time: 2019/12/18  3:49 PM   Specimen: BLOOD  Result Value Ref Range Status   Specimen Description BLOOD BLOOD RIGHT HAND  Final   Special Requests   Final    BOTTLES DRAWN AEROBIC AND ANAEROBIC Blood Culture adequate volume   Culture   Final    NO GROWTH 5 DAYS Performed at [facility], [address], [place], [place] [address]    Report Status 11/26/2019 FINAL  Final  Blood culture (routine x 2)     Status: None   Collection Time: 2019-12-18  4:40 PM   Specimen:  BLOOD  Result Value Ref Range Status   Specimen Description BLOOD LEFT SHOULDER  Final   Special Requests   Final    BOTTLES DRAWN AEROBIC AND ANAEROBIC Blood Culture adequate volume   Culture   Final    NO GROWTH 5 DAYS Performed at [facility], [address] [place], [place] [address]    Report Status 11/26/2019 FINAL  Final  Respiratory Panel by RT PCR (Flu A&B, Covid) - Nasopharyngeal Swab     Status: None   Collection Time: 12-18-19  8:45 PM   Specimen: Nasopharyngeal Swab  Result Value Ref Range Status   SARS Coronavirus 2 by RT PCR NEGATIVE NEGATIVE Final    Comment: (NOTE) SARS-CoV-2 target nucleic acids are NOT DETECTED.  The SARS-CoV-2 RNA is generally detectable in upper respiratoy specimens during the acute phase of infection. The lowest concentration of SARS-CoV-2 viral copies this assay can detect is 131 copies/mL. A negative result does not preclude SARS-Cov-2 infection and should not be used as the sole basis for treatment or other patient management decisions. A negative result may occur with  improper specimen collection/handling, submission of specimen other than nasopharyngeal swab, presence of viral mutation(s) within the areas targeted by this assay, and inadequate number of viral copies (<131 copies/mL). A negative result must be combined with clinical observations, patient history, and epidemiological information. The expected result is Negative.  Fact Sheet for Patients:  https://www.moore.com/  Fact Sheet for Healthcare Providers:  https://www.young.biz/  This test is no t yet approved or cleared by the [place] FDA and  has been authorized for detection and/or diagnosis of SARS-CoV-2 by FDA under an Emergency Use Authorization (EUA). This EUA will remain  in effect (meaning this test can be used) for the duration of the COVID-19 declaration under Section 564(b)(1) of the Act, 21 U.S.C. section  360bbb-3(b)(1), unless the authorization is terminated or revoked sooner.     Influenza A by PCR NEGATIVE NEGATIVE Final   Influenza B by PCR NEGATIVE NEGATIVE Final    Comment: (NOTE) The Xpert Xpress SARS-CoV-2/FLU/RSV assay is intended as an aid in  the diagnosis of influenza from Nasopharyngeal swab specimens and  should not be used as a sole basis for treatment. Nasal washings and  aspirates are unacceptable for Xpert Xpress SARS-CoV-2/FLU/RSV  testing.  Fact Sheet for Patients: https://www.moore.com/https://www.fda.gov/media/142436/download  Fact Sheet for Healthcare Providers: https://www.young.biz/https://www.fda.gov/media/142435/download  This test is not yet approved or cleared by the [place] FDA and  has been authorized for detection and/or diagnosis of SARS-CoV-2 by  FDA under an Emergency Use Authorization (EUA). This EUA will remain  in effect (meaning this test can be used) for the duration of the  Covid-19 declaration under Section 564(b)(1) of the Act, 21  U.S.C. section 360bbb-3(b)(1), unless the authorization is  terminated or revoked. Performed at [facility], [address] [place], [place] [address]   MRSA PCR Screening     Status: None   Collection Time: 11/22/19 12:40 AM   Specimen: Nasal Mucosa; Nasopharyngeal  Result Value Ref Range Status   MRSA by PCR NEGATIVE NEGATIVE Final    Comment:        The GeneXpert MRSA Assay (FDA approved for NASAL specimens only), is one component of a comprehensive MRSA colonization surveillance program. It is not intended to diagnose MRSA infection nor to guide or monitor treatment for MRSA infections. Performed at [facility], [address] [place], [place] [address]   Urine Culture     Status: Abnormal   Collection Time: 11/22/19  1:50 AM   Specimen: Urine, Random  Result Value Ref Range Status   Specimen Description   Final    URINE, RANDOM Performed at [facility], [address] [place], [place]  [address]    Special Requests   Final    NONE Performed at [facility], [address] [place], [place] [address]    Culture MULTIPLE SPECIES PRESENT, SUGGEST RECOLLECTION (A)  Final   Report Status 11/23/2019 FINAL  Final  Culture, blood (routine x 2)     Status: None (Preliminary result)   Collection Time: 11/27/19  1:48 PM   Specimen: BLOOD  Result Value Ref Range Status   Specimen Description BLOOD RIGHT ANTECUBITAL  Final   Special Requests   Final    BOTTLES DRAWN AEROBIC AND ANAEROBIC Blood Culture results may not be optimal due to an inadequate volume of blood received in culture bottles   Culture   Final    NO GROWTH < 24 HOURS Performed at [facility], [address] [place], [place] [address]    Report Status PENDING  Incomplete  Culture, blood (routine x 2)     Status: None (Preliminary result)   Collection Time: 11/27/19  1:54 PM   Specimen: BLOOD  Result Value Ref Range Status   Specimen Description BLOOD BLOOD RIGHT HAND  Final   Special Requests   Final    BOTTLES DRAWN AEROBIC  AND ANAEROBIC Blood Culture adequate volume   Culture   Final    NO GROWTH < 24 HOURS Performed at [facility], [address] [place], [place] [address]    Report Status PENDING  Incomplete         Radiology Studies: US Abdomen Limited  Result Date: 11/27/2019 CLINICAL DATA:  Ascites EXAM: LIMITED ABDOMEN ULTRASOUND FOR ASCITES TECHNIQUE: Limited ultrasound survey for ascites was performed in all four abdominal quadrants. COMPARISON:  CT abdomen and pelvis November 13, 2019 FINDINGS: Only trace free fluid is present about the liver. No significant pocket of fluid is demonstrated on exam for ascites. IMPRESSION: Minimal fluid about the liver Electronically Signed   By: Donzetta Kohut M.D.   On: 11/27/2019 16:29   US LIVER DOPPLER  Result Date: 11/27/2019 CLINICAL DATA:  Alcoholic cirrhosis EXAM: DUPLEX ULTRASOUND OF LIVER TECHNIQUE: Color and duplex  Doppler ultrasound was performed to evaluate the hepatic in-flow and out-flow vessels. COMPARISON:  CT evaluation from 12/07/19 FINDINGS: Liver: Marked increased echogenicity of the liver which attenuates the ultrasound beam making remaining assessments quite limited. Heterogeneity of echotexture. Nodular contour, mildly nodular contour. Assessment for focal lesions is limited by the severe fatty infiltration on the current study. No gross lesion is seen. Main Portal Vein size: 1.8 cm Portal Vein Velocities Portal vein can be visualized on grayscale but power Doppler or color Doppler flow cannot be obtained. Spectral Doppler without convincing sign of flow as well. Hepatic Vein Velocities Hepatic veins are not visualized due to severe echogenicity increased in the liver. IVC: Patent with velocity of 32 centimeter/second and pulsatile flow suggested though with limited assessment. Hepatic Artery Velocity:  132 cm/sec Splenic Vein Velocity:  17.8 cm/sec Spleen: 10.1 x 10.3 x 4.8 cm with a total volume of 259 cm^3 (411 cm^3 is upper limit normal) Portal Vein Occlusion/Thrombus: Possible portal venous occlusion though exam is markedly limited. Splenic Vein Occlusion/Thrombus: No Ascites: Trace Varices: None visualized IMPRESSION: 1. Findings that are highly suggestive of portal venous thrombosis. Portal venous flow was visualized on the study of Dec 07, 2019. For confirmation, given the technical limitations of the current study would consider MR venography or CT venography as possible in this patient. 2. Hepatic veins cannot be visualized, of uncertain significance given the severity of hepatic steatosis. 3. Severe hepatic steatosis. These results were called by telephone at the time of interpretation on 11/27/2019 at 5:00 pm to provider [facility] , who verbally acknowledged these results. Electronically Signed   By: Donzetta Kohut M.D.   On: 11/27/2019 17:12   MR MRV ABDOMEN W WO CONTRAST  Result Date:  11/28/2019 CLINICAL DATA:  39 year old female with decompensated alcoholic cirrhosis. Evaluate for portal vein thrombosis. EXAM: MRA ABDOMEN AND PELVIS WITH CONTRAST TECHNIQUE: Multiplanar, multiecho pulse sequences of the abdomen and pelvis were obtained with intravenous contrast. Angiographic images of abdomen and pelvis were obtained using MRA technique with intravenous contrast. Venous phase imaging was obtained. CONTRAST:  63mL GADAVIST GADOBUTROL 1 MMOL/ML IV SOLN COMPARISON:  CT abdomen/pelvis 12/07/2019 FINDINGS: MRA ABDOMEN FINDINGS Aorta: Normal caliber aorta without evidence of dissection, aneurysm or significant stenosis. Celiac axis: Patent.  Conventional hepatic arterial anatomy. SMA: Widely patent. Renals: There are 2 right-sided renal arteries and a single left-sided renal artery. Renal arteries are patent without significant stenosis, aneurysm, dissection or evidence of fibromuscular dysplasia. IMA: Not well seen. Iliac arteries: Normal in caliber. No evidence of stenosis, aneurysm or dissection. Venous: Portal venous phase imaging demonstrates patency  of the main portal vein and intrahepatic portal venous branches. No evidence of portal vein thrombosis. Additionally, the hepatic veins are also patent. The hepatic veins and intrahepatic IVC are slightly compressed, likely secondary to relative hepatic steatosis and hepatomegaly. No significant varices are visualized. MRI ABD FINDINGS Hepatomegaly. The liver measures up to 29 cm in craniocaudal dimension. The hepatic parenchyma is slightly hyperintense on T1 weighted imaging, hypointense on T2 weighted imaging and demonstrates significant signal dropout on fat saturation images. Findings are consistent with hepatic steatosis. No discrete hepatic lesion identified. No intra or extrahepatic biliary ductal dilatation. Unremarkable pancreas without evidence of inflammation or mass. The spleen is normal in size. Kidneys and adrenal glands are  unremarkable in appearance. No evidence of bowel obstruction or focal bowel abnormality. The visualized lower chest is unremarkable. No focal abnormal signal or enhancement within the visualized musculoskeletal structures. IMPRESSION: 1. The portal and hepatic veins remain patent. No evidence of portal venous thrombosis. 2. Hepatomegaly with marked steatosis. 3. No evidence of discrete hepatic lesion. Electronically Signed   By: Malachy Moan M.D.   On: 11/28/2019 08:12        Scheduled Meds: . amoxicillin-clavulanate  800 mg Oral Q12H  . Chlorhexidine Gluconate Cloth  6 each Topical Daily  . folic acid  1 mg Oral Daily  . heparin  5,000 Units Subcutaneous Q8H  . lactulose  30 g Oral BID  . mouth rinse  15 mL Mouth Rinse BID  . methylPREDNISolone (SOLU-MEDROL) injection  40 mg Intravenous Daily  . multivitamin-lutein  1 capsule Oral Daily  . oxcarbazepine  600 mg Oral BID  . pantoprazole (PROTONIX) IV  40 mg Intravenous Q24H  . rifaximin  550 mg Oral BID  . sodium chloride flush  10-40 mL Intracatheter Q12H  . spironolactone  25 mg Oral Daily  . thiamine  100 mg Oral Daily   Or  . thiamine  100 mg Intravenous Daily   Continuous Infusions: . dextrose 5 % and 0.9 % NaCl with KCl 20 mEq/L Stopped (11/27/19 2335)  . potassium chloride       LOS: 7 days    Time spent: 28 minutes    Marrion Coy, MD [facility]   To contact the attending provider between 7A-7P or the covering provider during after hours 7P-7A, please log into the web site [email or web address] and access using universal [place] password for that web site. If you do not have the password, please call the hospital operator.  11/28/2019, 9:57 AM

## 2019-11-28 NOTE — Evaluation (Signed)
Physical Therapy Evaluation Patient Details Name: Ashlee Hawkins MRN: [id number] DOB: 04-Feb-1981 Today's Date: 11/28/2019   History of Present Illness  Pt is a 39 y.o. female presenting to hospital 10/18 with seizure that morning and LUQ abdominal pain; multiple falls past 2 weeks.  Pt admitted with severe hyponatremia with reported seizure, alcoholic hepatitis, and sepsis.  PMH includes Bipolar disorder, htn, alcoholic abuse with alcoholic hepatitis, PTSD, seizures.  Clinical Impression  Prior to hospital admission, pt was independent with ambulation; lives with family in home with steps to navigate.  Pt oriented to person, place, and partial situation.  Inconsistent and requiring increased time with 1 step commands.  Pt noted to be tremulous (entire body) with sitting and standing activities but no shakiness noted with pt at rest in bed beginning of session and sitting in recliner end of session.  Currently pt is SBA semi-supine to sitting edge of bed; min assist x2 for transfers with RW; and min assist x2 to ambulate a few feet bed to recliner with RW (pt requiring cueing for taking steps and increased time required to take steps).  Generalized weakness noted.  Pt noted with more difficulty utilizing L hand at times during session (L hand and wrist appearing weaker with some activities): OT consulted.  Pt would benefit from skilled PT to address noted impairments and functional limitations (see below for any additional details).  Upon hospital discharge, pt would benefit from STR.    Follow Up Recommendations SNF    Equipment Recommendations  Rolling walker with 5" wheels;3in1 (PT)    Recommendations for Other Services OT consult     Precautions / Restrictions Precautions Precautions: Fall Precaution Comments: Seizure precautions; L midline Restrictions Weight Bearing Restrictions: No      Mobility  Bed Mobility Overal bed mobility: Needs Assistance Bed Mobility: Supine to Sit      Supine to sit: Supervision;HOB elevated;Min guard     General bed mobility comments: increased effort and time to perform on own    Transfers Overall transfer level: Needs assistance Equipment used: Rolling walker (2 wheeled) Transfers: Sit to/from Stand Sit to Stand: Min assist;+2 physical assistance         General transfer comment: x2 trials standing from bed; vc's and tactile cues for UE and LE placement for transfers and walker use  Ambulation/Gait Ambulation/Gait assistance: Min assist;+2 physical assistance Gait Distance (Feet): 3 Feet (bed to recliner) Assistive device: Rolling walker (2 wheeled)   Gait velocity: decreased   General Gait Details: decreased B LE step length/foot clearance/heelstrike; increased time and effort to take steps; vc's for technique and walker use required  Stairs            Wheelchair Mobility    Modified Rankin (Stroke Patients Only)       Balance Overall balance assessment: Needs assistance Sitting-balance support: Feet supported;Single extremity supported Sitting balance-Leahy Scale: Poor Sitting balance - Comments: pt shaky in general sitting edge of bed requiring at least single UE support for static sitting balance and CGA from therapist for safety   Standing balance support: Bilateral upper extremity supported Standing balance-Leahy Scale: Poor Standing balance comment: pt requiring B UE support on RW and CGA x2 of therapy for safety in standing (d/t pt shaky)                             Pertinent Vitals/Pain Pain Assessment: Faces Faces Pain Scale: No hurt Pain Intervention(s): Limited  activity within patient's tolerance;Monitored during session;Repositioned  HR 93-105 bpm during sessions activities.  O2 sats WFL on room air during session.    Home Living Family/patient expects to be discharged to:: Private residence Living Arrangements: Spouse/significant other;Children (52 y.o. son) Available  Help at Discharge: Family Type of Home: House Home Access: Stairs to enter Entrance Stairs-Rails: None Secretary/administrator of Steps: 4 Home Layout: Two level Home Equipment: None      Prior Function Level of Independence: Independent         Comments: Likes to play football with her 26 y.o. son     Hand Dominance        Extremity/Trunk Assessment   Upper Extremity Assessment Upper Extremity Assessment: Generalized weakness (L hand grip and L wrist appearing weaker compared to R)    Lower Extremity Assessment Lower Extremity Assessment: Generalized weakness    Cervical / Trunk Assessment Cervical / Trunk Assessment: Normal  Communication   Communication:  (pt with shaky speech (slurred at times); increased time to respond with short answers)  Cognition Arousal/Alertness: Awake/alert Behavior During Therapy: Flat affect Overall Cognitive Status: No family/caregiver present to determine baseline cognitive functioning                                 General Comments: Oriented to person, place, and partial situation.  Increased time to respond to 1 step cues (inconsistent at times)      General Comments   Nursing cleared pt for participation in physical therapy.  Pt agreeable to PT session.    Exercises Transfer/mobility training   Assessment/Plan    PT Assessment Patient needs continued PT services  PT Problem List Decreased strength;Decreased activity tolerance;Decreased balance;Decreased mobility;Decreased cognition;Decreased knowledge of use of DME;Decreased knowledge of precautions;Decreased safety awareness       PT Treatment Interventions DME instruction;Gait training;Stair training;Functional mobility training;Therapeutic activities;Therapeutic exercise;Balance training;Neuromuscular re-education;Patient/family education    PT Goals (Current goals can be found in the Care Plan section)  Acute Rehab PT Goals Patient Stated Goal: to  improve strength and walking PT Goal Formulation: With patient Time For Goal Achievement: 12/12/19 Potential to Achieve Goals: Good    Frequency Min 2X/week   Barriers to discharge Decreased caregiver support      Co-evaluation               AM-PAC PT "6 Clicks" Mobility  Outcome Measure Help needed turning from your back to your side while in a flat bed without using bedrails?: None Help needed moving from lying on your back to sitting on the side of a flat bed without using bedrails?: A Little Help needed moving to and from a bed to a chair (including a wheelchair)?: A Lot Help needed standing up from a chair using your arms (e.g., wheelchair or bedside chair)?: A Lot Help needed to walk in hospital room?: Total Help needed climbing 3-5 steps with a railing? : Total 6 Click Score: 13    End of Session Equipment Utilized During Treatment: Gait belt Activity Tolerance: Patient limited by fatigue Patient left: in chair;with call bell/phone within reach;with chair alarm set;Other (comment) (B heels floating via pillow support) Nurse Communication: Mobility status;Precautions PT Visit Diagnosis: Unsteadiness on feet (R26.81);Other abnormalities of gait and mobility (R26.89);Muscle weakness (generalized) (M62.81);History of falling (Z91.81);Difficulty in walking, not elsewhere classified (R26.2)    Time: 0935-1010 PT Time Calculation (min) (ACUTE ONLY): 35 min   Charges:  PT Evaluation $PT Eval Low Complexity: 1 Low PT Treatments $Therapeutic Activity: 23-37 mins       Hendricks Limes, PT 11/28/19, 10:36 AM

## 2019-11-29 DIAGNOSIS — K7011 Alcoholic hepatitis with ascites: Secondary | ICD-10-CM

## 2019-11-29 DIAGNOSIS — Z515 Encounter for palliative care: Secondary | ICD-10-CM | POA: Diagnosis not present

## 2019-11-29 DIAGNOSIS — K7290 Hepatic failure, unspecified without coma: Secondary | ICD-10-CM | POA: Diagnosis not present

## 2019-11-29 DIAGNOSIS — Z7189 Other specified counseling: Secondary | ICD-10-CM | POA: Diagnosis not present

## 2019-11-29 DIAGNOSIS — K7031 Alcoholic cirrhosis of liver with ascites: Secondary | ICD-10-CM | POA: Diagnosis not present

## 2019-11-29 DIAGNOSIS — E871 Hypo-osmolality and hyponatremia: Secondary | ICD-10-CM | POA: Diagnosis not present

## 2019-11-29 LAB — BASIC METABOLIC PANEL
Anion gap: 10 (ref 5–15)
BUN: 6 mg/dL (ref 6–20)
CO2: 21 mmol/L — ABNORMAL LOW (ref 22–32)
Calcium: 7.8 mg/dL — ABNORMAL LOW (ref 8.9–10.3)
Chloride: 108 mmol/L (ref 98–111)
Creatinine, Ser: UNDETERMINED mg/dL (ref 0.44–1.00)
Glucose, Bld: 98 mg/dL (ref 70–99)
Potassium: 3.8 mmol/L (ref 3.5–5.1)
Sodium: 139 mmol/L (ref 135–145)

## 2019-11-29 LAB — CBC WITH DIFFERENTIAL/PLATELET
Abs Immature Granulocytes: 0.24 10*3/uL — ABNORMAL HIGH (ref 0.00–0.07)
Basophils Absolute: 0 10*3/uL (ref 0.0–0.1)
Basophils Relative: 0 %
Eosinophils Absolute: 0.1 10*3/uL (ref 0.0–0.5)
Eosinophils Relative: 1 %
HCT: 29.7 % — ABNORMAL LOW (ref 36.0–46.0)
Hemoglobin: 9 g/dL — ABNORMAL LOW (ref 12.0–15.0)
Immature Granulocytes: 3 %
Lymphocytes Relative: 18 %
Lymphs Abs: 1.6 10*3/uL (ref 0.7–4.0)
MCH: 36.3 pg — ABNORMAL HIGH (ref 26.0–34.0)
MCHC: 30.3 g/dL (ref 30.0–36.0)
MCV: 119.8 fL — ABNORMAL HIGH (ref 80.0–100.0)
Monocytes Absolute: 0.9 10*3/uL (ref 0.1–1.0)
Monocytes Relative: 10 %
Neutro Abs: 6.1 10*3/uL (ref 1.7–7.7)
Neutrophils Relative %: 68 %
Platelets: 92 10*3/uL — ABNORMAL LOW (ref 150–400)
RBC: 2.48 MIL/uL — ABNORMAL LOW (ref 3.87–5.11)
RDW: 22.6 % — ABNORMAL HIGH (ref 11.5–15.5)
Smear Review: NORMAL
WBC: 9 10*3/uL (ref 4.0–10.5)
nRBC: 2.5 % — ABNORMAL HIGH (ref 0.0–0.2)

## 2019-11-29 LAB — GLUCOSE, CAPILLARY
Glucose-Capillary: 104 mg/dL — ABNORMAL HIGH (ref 70–99)
Glucose-Capillary: 124 mg/dL — ABNORMAL HIGH (ref 70–99)
Glucose-Capillary: 131 mg/dL — ABNORMAL HIGH (ref 70–99)
Glucose-Capillary: 147 mg/dL — ABNORMAL HIGH (ref 70–99)
Glucose-Capillary: 85 mg/dL (ref 70–99)
Glucose-Capillary: 95 mg/dL (ref 70–99)

## 2019-11-29 LAB — HEPATIC FUNCTION PANEL
ALT: 52 U/L — ABNORMAL HIGH (ref 0–44)
AST: 159 U/L — ABNORMAL HIGH (ref 15–41)
Albumin: 2.1 g/dL — ABNORMAL LOW (ref 3.5–5.0)
Alkaline Phosphatase: 385 U/L — ABNORMAL HIGH (ref 38–126)
Bilirubin, Direct: 14.4 mg/dL — ABNORMAL HIGH (ref 0.0–0.2)
Indirect Bilirubin: 11.1 mg/dL — ABNORMAL HIGH (ref 0.3–0.9)
Total Bilirubin: 25.5 mg/dL (ref 0.3–1.2)
Total Protein: 5.3 g/dL — ABNORMAL LOW (ref 6.5–8.1)

## 2019-11-29 LAB — PHOSPHORUS: Phosphorus: UNDETERMINED mg/dL (ref 2.5–4.6)

## 2019-11-29 LAB — MAGNESIUM: Magnesium: 2.2 mg/dL (ref 1.7–2.4)

## 2019-11-29 MED ORDER — SERTRALINE HCL 50 MG PO TABS
50.0000 mg | ORAL_TABLET | Freq: Every day | ORAL | Status: DC
  Administered 2019-11-29 – 2019-11-30 (×2): 50 mg via ORAL
  Filled 2019-11-29 (×2): qty 1

## 2019-11-29 MED ORDER — MEGESTROL ACETATE 400 MG/10ML PO SUSP
400.0000 mg | Freq: Every day | ORAL | Status: DC
  Administered 2019-11-29 – 2019-11-30 (×2): 400 mg via ORAL
  Filled 2019-11-29 (×3): qty 10

## 2019-11-29 MED ORDER — MELATONIN 5 MG PO TABS
2.5000 mg | ORAL_TABLET | Freq: Once | ORAL | Status: AC
  Administered 2019-11-29: 2.5 mg via ORAL
  Filled 2019-11-29: qty 0.5

## 2019-11-29 NOTE — Consult Note (Signed)
PHARMACY CONSULT NOTE - FOLLOW UP  Pharmacy Consult for Electrolyte Monitoring and Replacement   Recent Labs: Potassium (mmol/L)  Date Value  11/29/2019 3.8   Magnesium (mg/dL)  Date Value  11/91/4782 2.2   Calcium (mg/dL)  Date Value  95/62/1308 7.8 (L)   Albumin (g/dL)  Date Value  65/78/4696 2.1 (L)   Phosphorus (mg/dL)  Date Value  29/52/8413  UNABLE TO REPORT DUE TO ICTERIC INTERFERENCE SDR   Sodium (mmol/L)  Date Value  11/29/2019 139     Assessment: 39yo female with PMH bipolar disorder, HTN, alcohol abuse with alcoholic hepatitis who came to ED c/o seizure occurring ~0900 10/18. Patient reports drinking her usual amount including 5 shots. Patient speech noted to be slow and deliberate. Skin and eyes noted to have yellow tone. Pharmacy has been consulted for monitoring electrolytes.   On D5 NS w/ KCl 20 mEq @75  mL/hr.    Goal of Therapy:  WNL  Plan:  No additional replenishment warranted.   Recheck electrolytes with morning labs.  , PharmD, BCPS Clinical Pharmacist 11/29/2019 7:37 AM

## 2019-11-29 NOTE — Evaluation (Signed)
Clinical/Bedside Swallow Evaluation Patient Details  Name: Ashlee Hawkins MRN: [id number] Date of Birth: Jun 12, 1980  Today's Date: 11/29/2019 Time: SLP Start Time (ACUTE ONLY): 1410 SLP Stop Time (ACUTE ONLY): 1510 SLP Time Calculation (min) (ACUTE ONLY): 60 min  Past Medical History:  Past Medical History:  Diagnosis Date  . Alcoholism (HCC)   . Bipolar 1 disorder (HCC)   . Hypertension   . Liver disease   . PTSD (post-traumatic stress disorder)   . Seizures (HCC)    Past Surgical History:  Past Surgical History:  Procedure Laterality Date  . CHOLECYSTECTOMY     HPI:  Pt is a 39 yo female with a significant history of alcohol abuse, seizure disorder and alcoholic hepatitis presenting to the ED reporting LUQ abdominal pain and a seizure at home on the morning of 12/01/2019.  Patient reports last alcoholic beverage was the morning of December 01, 2019.  She reports drinking a 1/5th of liquor daily.  ED workup included labs significant for: Na+108, K+ <2.0, Cl <65, Total Bili 20.1, AST 313, ALT 66, Ammonia 106, WBC 11.9, Lactic >11.  Patient was started on NaCL 3% infusion, but Na+ corrected to 116 within 12 hours, and was discontinued.  The patient's husband reported that she stopped taking her anti-convulsant medication 2 weeks ago and began drinking.  He reported witnessing "small" seizure like activity during this time where the patient would stare off and slur her words. She experienced multiple falls during these past two weeks, with reports of hitting her head.  She also developed nausea/vomiting and diarrhea with a temperature of 102 within the past 5 days.   Assessment / Plan / Recommendation Clinical Impression  Pt appears to present w/ oropharyngeal phase dysphagia in light of Significantly declined Cognitive/Mental status and/or changes. This impacts her overall awareness/engagement and safety during po taskswhich increases risk for aspiration, choking. Pt's risk for aspiration, as well  as inability to meet nutritional needs fully and safely. Strict aspiration precautions and feeding assistance is necessary at all meals to monitor pt's safety during oral intake. She required Mod+ tactile/verbal/ visualcues for orientation to bolus presentation, and during oral intake to maintain attention to boluses held orally. Full feeding support given. Pt consumed only few trials of TSPs of Nectar liquids (viscosity, texture) and Puree this session -- no immediate, overt clinical s/s of aspiration noted; no decline in vocal quality during few mumbled verbalizations, no cough, and no decline in respiratory status during/post trials. Oral phase was c/b oral holding and lengthy bolus management for A-P transfer w/ swallow following; oral residue of boluses given was noted b/t trials so Time was given for pt to use dry swallows to clear. She often exhibited munching and swishing of boluses held orally despite verbal/tactile to swallow/clear. OM Exam was cursory/observation, but No unilateral weakness noted during bolus management. Pt appeared confused w/ attempted oral care and bit on the swab stick. D/t pt's declined Cognitive status, and her risk for aspiration, recommend modifying the diet to dysphagia level 1(puree) w/ Nectar liquids w/ strict aspiration precautions; reduce Distractions during meals and only give po's when pt is calm and participative -- stop feeding if increased oral holding and/or s/s of aspiration noted. Pills Crushed in Puree for safer swallowing. Support w/ feeding at meals -- check for oral clearing during/post intake. NSG updated. Continue w/ Palliative Care for GOC; Dietician f/u. ST services can follow pt's toleration of diet while admitted. Precautions posted in room. GI present prior and updated. NSG updated.  SLP Visit Diagnosis: Dysphagia, oropharyngeal phase (R13.12) (Mental status/Cognitive decline)    Aspiration Risk  Moderate aspiration risk;Risk for inadequate  nutrition/hydration    Diet Recommendation  Dysphagia level 1 (puree) w/ Nectar consistency liquids; strict aspiration precautions; strict supervision w/ all po's; feeding support and verbal/tactile cues -- Stop feeding if pt is unable to participate and/or overt s/s of aspiration. (inform MD)  Medication Administration: Crushed with puree (for safer swallowing)    Other  Recommendations Recommended Consults:  (Palliative Care following; GI; Dietician) Oral Care Recommendations: Oral care BID;Oral care before and after PO;Staff/trained caregiver to provide oral care Other Recommendations: Order thickener from pharmacy;Remove water pitcher;Prohibited food (jello, ice cream, thin soups);Have oral suction available   Follow up Recommendations  (TBD)      Frequency and Duration min 2x/week  2 weeks       Prognosis Prognosis for Safe Diet Advancement: Guarded Barriers to Reach Goals: Cognitive deficits;Time post onset;Severity of deficits;Behavior      Swallow Study   General Date of Onset: December 15, 2019 HPI: Pt is a 39 yo female with a significant history of alcohol abuse, seizure disorder and alcoholic hepatitis presenting to the ED reporting LUQ abdominal pain and a seizure at home on the morning of 12/15/19.  Patient reports last alcoholic beverage was the morning of December 15, 2019.  She reports drinking a 1/5th of liquor daily.  ED workup included labs significant for: Na+108, K+ <2.0, Cl <65, Total Bili 20.1, AST 313, ALT 66, Ammonia 106, WBC 11.9, Lactic >11.  Patient was started on NaCL 3% infusion, but Na+ corrected to 116 within 12 hours, and was discontinued.  The patient's husband reported that she stopped taking her anti-convulsant medication 2 weeks ago and began drinking.  He reported witnessing "small" seizure like activity during this time where the patient would stare off and slur her words. She experienced multiple falls during these past two weeks, with reports of hitting her head.  She  also developed nausea/vomiting and diarrhea with a temperature of 102 within the past 5 days. Type of Study: Bedside Swallow Evaluation Previous Swallow Assessment: none Diet Prior to this Study: Regular;Thin liquids (choking per NSG report) Temperature Spikes Noted: No (wbc 9.0) Respiratory Status: Room air History of Recent Intubation: No Behavior/Cognition: Cooperative;Pleasant mood;Confused;Distractible;Requires cueing;Doesn't follow directions (Awake) Oral Cavity Assessment: Within Functional Limits (with what could be assessed(closed mouth)) Oral Care Completed by SLP: Yes (attempted) Oral Cavity - Dentition: Adequate natural dentition Vision:  (n/a) Self-Feeding Abilities: Total assist Patient Positioning: Postural control adequate for testing (pt moved about often) Baseline Vocal Quality: Low vocal intensity (mumbled speech) Volitional Cough: Cognitively unable to elicit Volitional Swallow: Unable to elicit    Oral/Motor/Sensory Function Overall Oral Motor/Sensory Function: Generalized oral weakness   Ice Chips Ice chips: Not tested   Thin Liquid Thin Liquid: Not tested    Nectar Thick Nectar Thick Liquid: Impaired Presentation: Spoon (fed; 4 trials) Oral Phase Impairments: Poor awareness of bolus;Reduced lingual movement/coordination Oral phase functional implications: Prolonged oral transit;Oral holding;Oral residue Pharyngeal Phase Impairments: Suspected delayed Swallow   Honey Thick Honey Thick Liquid: Not tested   Puree Puree: Impaired Presentation: Spoon (fed; 2 trials) Oral Phase Impairments: Reduced lingual movement/coordination;Poor awareness of bolus Oral Phase Functional Implications: Oral residue;Prolonged oral transit;Oral holding Pharyngeal Phase Impairments: Suspected delayed Swallow   Solid     Solid: Not tested       Jerilynn Som, MS, CCC-SLP Speech Language Pathologist Rehab Services [phone] Mashal Slavick 11/29/2019,4:06 PM

## 2019-11-29 NOTE — Progress Notes (Addendum)
Palliative:  HPI: 39 y.o. female  with past medical history of ETOH abuse, seizures, and alcoholic hepatitis admitted on 11/30/2019 with LUQ pain and seizure at home. CT of abdomen revealed enlarged fatty liver. Found to have severe hyponatremia, hepatic encephalopathy, liver ascites, and ongoing severely elevated ammonia and bilirubin levels. PMT consulted to discuss [place].   I met today at [facility] bedside. She is alert and interactive although with delayed responses and overall confusion still. She has severe jaundice. She asks about her cell phone and requests to see her husband.   I called and left voicemail for husband, Jenny Reichmann, who later returns my call. John seems to have good understanding of his wife's illness and severity of her disease process. I reported to him my assessment. I explained that she is receiving steroid treatment but overall prognosis remains poor. I expressed my concern for liver and renal function (have not been able to measure renal function due to severity of hyperbilirubinemia). Intake still very poor.   John requests that we continue with treatments to optimize and support Rosalyn. He has put in place DNR but hopeful for otherwise continued treatment. He is aware that overall prognosis is extremely poor. We agree to have ongoing goals of care conversations.   All questions/concerns addressed. Emotional support provided.   Exam: Alert, confused. No distress. Breathing regular, unlabored. Severe jaundice. Abd distended but soft.   Plan: - DNR but continue current interventions with hopes of some level of improvement.   25 min  Vinie Sill, NP Palliative Medicine Team Pager [phone] (Please see [email or web address] for schedule) Team Phone [phone]    Greater than 50%  of this time was spent counseling and coordinating care related to the above assessment and plan

## 2019-11-29 NOTE — TOC Progression Note (Signed)
Transition of Care [facility]) - Progression Note    Patient Details  Name: MAVERICK PATMAN MRN: [id number] Date of Birth: 11-Jun-1980  Transition [facility]) CM/SW Contact  Liliana Cline, LCSW Phone Number: 11/29/2019, 12:34 PM  Clinical Narrative:    No bed offers in Epic.  Called [facility], spoke with Tammy who will review patient's chart then follow up.  Called [facility], they are not taking Medicaid rehab patients.  Called [facility], per Gavin Pound she will have staff review his information today.        Expected Discharge Plan and Services                                                 Social Determinants of Health (SDOH) Interventions    Readmission Risk Interventions No flowsheet data found.

## 2019-11-29 NOTE — Progress Notes (Signed)
PROGRESS NOTE    Ashlee Hawkins  ZOX:[id number] DOB: 07/06/1980 DOA: 11/06/2019 PCP: [facility]   Chief complaint.  Altered mental status. Brief Narrative:  Patient is a 39 year old female with a history of alcohol abuse, seizure disorder and alcohol hepatitis who initially admitedto ICU on 10/18 with a severe hyponatremia with sodium 108, ammonia 106.She has been continuously drinking alcohol, last drink was before admission. She was also having seizures. Since admission to the ICU, sodium level has went up after giving 3% sodium chloride, she was  also giving lactulose.She is very confused, mental status has not improved since admission. Her total bilirubin was 23.3. Ammonia level still elevated. Patient has been seen by palliative care, long-term prognosis is very poor.  10/23.Increase the lactulose dose, startedrehydration. 10/24.Patient mental status seem to be improving today. Still has very high bilirubin level, consult GI. 10/25.  Patient does not have portal vein thrombosis on MRV study.  Start IV steroids per GI.   Assessment & Plan:   Active Problems:   Hyponatremia with decreased serum osmolality   Alcoholic cirrhosis of liver with ascites (HCC)   Hepatic encephalopathy (HCC)   Goals of care, counseling/discussion   Palliative care by specialist   DNR (do not resuscitate)   Seizure (HCC)  #1.  Hepatic encephalopathy. Patient mental status gradually getting better.  She is treated with IV fluids, lactulose and rifaximin.  She is also placed on IV steroids per GI.  #2.  Alcohol liver cirrhosis with ascites. Ultrasound did not show large enough ascites to tap, MRI ruled out portal vein thrombosis. Patient currently on Aldactone and steroids. Long-term prognosis guarded.  Patient has extreme elevation of bilirubin. Please note that patient creatinine level and phosphorus level were not able to be measured due to severe elevation of  bilirubin.  #3.  Anorexia with a severe protein calorie malnutrition. Patient has very poor appetite. Continue supplement, also added megestrol.  4.  Aspiration pneumonia. Completed antibiotics x 5days.  5.  Severe hyponatremia. Condition has resolved.  6.  Anemia of chronic disease. Continue to follow.  7.  Thrombocytopenia. Secondary to liver cirrhosis.  Stable.     DVT prophylaxis: Heparin Code Status: DNR Family Communication: Husband updated.   .   Status is: Inpatient  Remains inpatient appropriate because:Inpatient level of care appropriate due to severity of illness patient cannot be discharged until mental status improves and liver function more stable.   Dispo: The patient is from: Home              Anticipated d/c is to: Home              Anticipated d/c date is: > 3 days              Patient currently is not medically stable to d/c.        I/O last 3 completed shifts: In: 659.4 [P.O.:240; I.V.:419.4] Out: -  No intake/output data recorded.     Consultants:   gi  Procedures: none  Antimicrobials: none  Subjective: Patient is more awake today, still has some confusion.  Appetite is very poor, but no nausea vomiting or abdominal pain.  She still has loose stools due to lactulose. He denies any short of breath or cough. She has no headache or dizziness. No fever or chills. She states that she slept pretty well last night.  Objective: Vitals:   11/28/19 2008 11/29/19 0027 11/29/19 0412 11/29/19 0758  BP: 117/90 114/83 90/67 93/77  Pulse: 100 (!) 106 91 92  Resp: 20 20 18    Temp: 97.8 F (36.6 C) 97.9 F (36.6 C) 97.9 F (36.6 C) 97.7 F (36.5 C)  TempSrc: Oral Oral  Oral  SpO2: 98% 98% 100% 98%  Weight:      Height:        Intake/Output Summary (Last 24 hours) at 11/29/2019 0843 Last data filed at 11/28/2019 1700 Gross per 24 hour  Intake 240 ml  Output --  Net 240 ml   Filed Weights   11/23/19 0347 11/24/19 0351 11/26/19  0408  Weight: 89.7 kg 91.1 kg 90.6 kg    Examination:  General exam: Appears calm and comfortable  Respiratory system: Clear to auscultation. Respiratory effort normal. Cardiovascular system: S1 & S2 heard, RRR. No JVD, murmurs, rubs, gallops or clicks. No pedal edema. Gastrointestinal system: Abdomen is distended, soft and nontender.  Severe hepatomegaly noted.  Normal bowel sounds heard. Central nervous system: Alert and oriented x2. No focal neurological deficits. Extremities: Symmetric  Skin: Severe jaundice Psychiatry: Mood & affect appropriate.     Data Reviewed: I have personally reviewed following labs and imaging studies  CBC: Recent Labs  Lab 11/24/19 0431 11/25/19 0030 11/26/19 0700 11/27/19 0413 11/29/19 0714  WBC 8.5 9.9 12.1* 12.2* 9.0  NEUTROABS 6.1 6.5 7.9* 8.0* PENDING  HGB 8.1* 8.6* 8.4* 8.3* 9.0*  HCT 23.5* 25.6* 25.2* 25.5* 29.7*  MCV 108.3* 110.8* 110.5* 114.3* 119.8*  PLT 173 188 148* 129* 92*   Basic Metabolic Panel: Recent Labs  Lab 11/25/19 0030 11/25/19 0620 11/25/19 1746 11/26/19 0700 11/27/19 0413 11/28/19 0456 11/29/19 0714  NA 131*  130*   < > 132* 132* 136 136 139  K 3.4*  3.4*  --   --  3.4* 3.3* 3.4* 3.8  CL 92*  --   --  95* 100 104 108  CO2 29  --   --  28 26 22  21*  GLUCOSE 103*  --   --  112* 105* 95 98  BUN <5*  --   --  <5* <5* <5* 6  CREATININE UNABLE TO REPORT DUE TO ICTERUS  --   --  UNABLE TO REPORT DUE TO ICTERUS.PMF UNABLE TO REPORT DUE TO ICTERUS  UNABLE TO REPORT DUE TO ICTERIC INTERFERENCE SDR  UNABLE TO REPORT DUE TO ICTERIC INTERFERENCE SDR  CALCIUM 7.9*  --   --  8.0* 7.9* 8.1* 7.8*  MG 2.3  2.3  --   --  2.1 2.1 2.1 2.2  PHOS UNABLE TO REPORT DUE TO ICTERUS  UNABLE TO REPORT DUE TO ICTERUS  --   --  UNABLE TO REPORT DUE TO ICTERUS.PMF UNABLE TO REPORT DUE TO ICTERUS UNABLE TO REPORT DUE TO ICTERIC INTERFERENCE SDR  UNABLE TO REPORT DUE TO ICTERIC INTERFERENCE SDR   < > = values in this interval not  displayed.   GFR: CrCl cannot be calculated (This lab value cannot be used to calculate CrCl because it is not a number:  UNABLE TO REPORT DUE TO ICTERIC INTERFERENCE SDR). Liver Function Tests: Recent Labs  Lab 11/25/19 0030 11/26/19 0700 11/27/19 0413 11/28/19 0456 11/29/19 0714  AST 296* 232* 204* 180* 159*  ALT 73* 62* 58* 55* 52*  ALKPHOS 443* 426* 449* 433* 385*  BILITOT 24.0* 23.3* 24.8* 26.8* 25.5*  PROT 5.7* 5.5* 5.5* 5.7* 5.3*  ALBUMIN 2.2* 2.1* 2.1* 2.2* 2.1*   Recent Labs  Lab 11/24/19 0610  LIPASE 80*  AMYLASE 131*  Recent Labs  Lab 11/24/19 0611 11/27/19 0413 11/28/19 0456  AMMONIA 68* 102* 103*   Coagulation Profile: Recent Labs  Lab 11/27/19 1218  INR 1.8*   Cardiac Enzymes: No results for input(s): CKTOTAL, CKMB, CKMBINDEX, TROPONINI in the last 168 hours. BNP (last 3 results) No results for input(s): PROBNP in the last 8760 hours. HbA1C: No results for input(s): HGBA1C in the last 72 hours. CBG: Recent Labs  Lab 11/28/19 1205 11/28/19 1715 11/28/19 2134 11/29/19 0032 11/29/19 0408  GLUCAP 117* 134* 109* 104* 95   Lipid Profile: No results for input(s): CHOL, HDL, LDLCALC, TRIG, CHOLHDL, LDLDIRECT in the last 72 hours. Thyroid Function Tests: No results for input(s): TSH, T4TOTAL, FREET4, T3FREE, THYROIDAB in the last 72 hours. Anemia Panel: No results for input(s): VITAMINB12, FOLATE, FERRITIN, TIBC, IRON, RETICCTPCT in the last 72 hours. Sepsis Labs: Recent Labs  Lab 11/23/19 0341 11/27/19 0413  PROCALCITON 1.47 0.75    Recent Results (from the past 240 hour(s))  Blood culture (routine x 2)     Status: None   Collection Time: 12/11/19  3:49 PM   Specimen: BLOOD  Result Value Ref Range Status   Specimen Description BLOOD BLOOD RIGHT HAND  Final   Special Requests   Final    BOTTLES DRAWN AEROBIC AND ANAEROBIC Blood Culture adequate volume   Culture   Final    NO GROWTH 5 DAYS Performed at [facility], [address] [place], [place] [address]    Report Status 11/26/2019 FINAL  Final  Blood culture (routine x 2)     Status: None   Collection Time: 12-11-2019  4:40 PM   Specimen: BLOOD  Result Value Ref Range Status   Specimen Description BLOOD LEFT SHOULDER  Final   Special Requests   Final    BOTTLES DRAWN AEROBIC AND ANAEROBIC Blood Culture adequate volume   Culture   Final    NO GROWTH 5 DAYS Performed at [facility], [address] [place], [place] [address]    Report Status 11/26/2019 FINAL  Final  Respiratory Panel by RT PCR (Flu A&B, Covid) - Nasopharyngeal Swab     Status: None   Collection Time: 2019/12/11  8:45 PM   Specimen: Nasopharyngeal Swab  Result Value Ref Range Status   SARS Coronavirus 2 by RT PCR NEGATIVE NEGATIVE Final    Comment: (NOTE) SARS-CoV-2 target nucleic acids are NOT DETECTED.  The SARS-CoV-2 RNA is generally detectable in upper respiratoy specimens during the acute phase of infection. The lowest concentration of SARS-CoV-2 viral copies this assay can detect is 131 copies/mL. A negative result does not preclude SARS-Cov-2 infection and should not be used as the sole basis for treatment or other patient management decisions. A negative result may occur with  improper specimen collection/handling, submission of specimen other than nasopharyngeal swab, presence of viral mutation(s) within the areas targeted by this assay, and inadequate number of viral copies (<131 copies/mL). A negative result must be combined with clinical observations, patient history, and epidemiological information. The expected result is Negative.  Fact Sheet for Patients:  https://www.moore.com/  Fact Sheet for Healthcare Providers:  https://www.young.biz/  This test is no t yet approved or cleared by the [place] FDA and  has been authorized for detection and/or diagnosis of SARS-CoV-2 by FDA under an Emergency Use  Authorization (EUA). This EUA will remain  in effect (meaning this test can be used) for the duration of the COVID-19 declaration under Section 564(b)(1) of the  Act, 21 U.S.C. section 360bbb-3(b)(1), unless the authorization is terminated or revoked sooner.     Influenza A by PCR NEGATIVE NEGATIVE Final   Influenza B by PCR NEGATIVE NEGATIVE Final    Comment: (NOTE) The Xpert Xpress SARS-CoV-2/FLU/RSV assay is intended as an aid in  the diagnosis of influenza from Nasopharyngeal swab specimens and  should not be used as a sole basis for treatment. Nasal washings and  aspirates are unacceptable for Xpert Xpress SARS-CoV-2/FLU/RSV  testing.  Fact Sheet for Patients: https://www.moore.com/https://www.fda.gov/media/142436/download  Fact Sheet for Healthcare Providers: https://www.young.biz/https://www.fda.gov/media/142435/download  This test is not yet approved or cleared by the [place] FDA and  has been authorized for detection and/or diagnosis of SARS-CoV-2 by  FDA under an Emergency Use Authorization (EUA). This EUA will remain  in effect (meaning this test can be used) for the duration of the  Covid-19 declaration under Section 564(b)(1) of the Act, 21  U.S.C. section 360bbb-3(b)(1), unless the authorization is  terminated or revoked. Performed at [facility], [address] [place], [place] [address]   MRSA PCR Screening     Status: None   Collection Time: 11/22/19 12:40 AM   Specimen: Nasal Mucosa; Nasopharyngeal  Result Value Ref Range Status   MRSA by PCR NEGATIVE NEGATIVE Final    Comment:        The GeneXpert MRSA Assay (FDA approved for NASAL specimens only), is one component of a comprehensive MRSA colonization surveillance program. It is not intended to diagnose MRSA infection nor to guide or monitor treatment for MRSA infections. Performed at [facility], [address] [place], [place] [address]   Urine Culture     Status: Abnormal   Collection Time: 11/22/19  1:50 AM     Specimen: Urine, Random  Result Value Ref Range Status   Specimen Description   Final    URINE, RANDOM Performed at [facility], [address] [place], [place] [address]    Special Requests   Final    NONE Performed at [facility], [address] [place], [place] [address]    Culture MULTIPLE SPECIES PRESENT, SUGGEST RECOLLECTION (A)  Final   Report Status 11/23/2019 FINAL  Final  Culture, blood (routine x 2)     Status: None (Preliminary result)   Collection Time: 11/27/19  1:48 PM   Specimen: BLOOD  Result Value Ref Range Status   Specimen Description BLOOD RIGHT ANTECUBITAL  Final   Special Requests   Final    BOTTLES DRAWN AEROBIC AND ANAEROBIC Blood Culture results may not be optimal due to an inadequate volume of blood received in culture bottles   Culture   Final    NO GROWTH 2 DAYS Performed at [facility], [address] [place], [place] [address]    Report Status PENDING  Incomplete  Culture, blood (routine x 2)     Status: None (Preliminary result)   Collection Time: 11/27/19  1:54 PM   Specimen: BLOOD  Result Value Ref Range Status   Specimen Description BLOOD BLOOD RIGHT HAND  Final   Special Requests   Final    BOTTLES DRAWN AEROBIC AND ANAEROBIC Blood Culture adequate volume   Culture   Final    NO GROWTH 2 DAYS Performed at [facility], [address] [place], [place] [address]    Report Status PENDING  Incomplete         Radiology Studies: US Abdomen Limited  Result Date: 11/27/2019 CLINICAL DATA:  Ascites EXAM: LIMITED ABDOMEN ULTRASOUND FOR ASCITES TECHNIQUE: Limited ultrasound survey for ascites was performed in all four abdominal quadrants. COMPARISON:  CT abdomen and pelvis November 13, 2019 FINDINGS: Only trace free fluid is present about the liver. No significant pocket of fluid is demonstrated on exam for ascites. IMPRESSION: Minimal fluid about the liver Electronically Signed   By: Donzetta Kohut M.D.   On: 11/27/2019 16:29   US LIVER DOPPLER  Result Date: 11/27/2019 CLINICAL DATA:  Alcoholic cirrhosis EXAM: DUPLEX ULTRASOUND OF LIVER TECHNIQUE: Color and duplex Doppler ultrasound was performed to evaluate the hepatic in-flow and out-flow vessels. COMPARISON:  CT evaluation from 2019-11-27 FINDINGS: Liver: Marked increased echogenicity of the liver which attenuates the ultrasound beam making remaining assessments quite limited. Heterogeneity of echotexture. Nodular contour, mildly nodular contour. Assessment for focal lesions is limited by the severe fatty infiltration on the current study. No gross lesion is seen. Main Portal Vein size: 1.8 cm Portal Vein Velocities Portal vein can be visualized on grayscale but power Doppler or color Doppler flow cannot be obtained. Spectral Doppler without convincing sign of flow as well. Hepatic Vein Velocities Hepatic veins are not visualized due to severe echogenicity increased in the liver. IVC: Patent with velocity of 32 centimeter/second and pulsatile flow suggested though with limited assessment. Hepatic Artery Velocity:  132 cm/sec Splenic Vein Velocity:  17.8 cm/sec Spleen: 10.1 x 10.3 x 4.8 cm with a total volume of 259 cm^3 (411 cm^3 is upper limit normal) Portal Vein Occlusion/Thrombus: Possible portal venous occlusion though exam is markedly limited. Splenic Vein Occlusion/Thrombus: No Ascites: Trace Varices: None visualized IMPRESSION: 1. Findings that are highly suggestive of portal venous thrombosis. Portal venous flow was visualized on the study of 11/27/19. For confirmation, given the technical limitations of the current study would consider MR venography or CT venography as possible in this patient. 2. Hepatic veins cannot be visualized, of uncertain significance given the severity of hepatic steatosis. 3. Severe hepatic steatosis. These results were called by telephone at the time of interpretation on 11/27/2019 at 5:00 pm to provider  [facility] , who verbally acknowledged these results. Electronically Signed   By: Donzetta Kohut M.D.   On: 11/27/2019 17:12   MR MRV ABDOMEN W WO CONTRAST  Result Date: 11/28/2019 CLINICAL DATA:  39 year old female with decompensated alcoholic cirrhosis. Evaluate for portal vein thrombosis. EXAM: MRA ABDOMEN AND PELVIS WITH CONTRAST TECHNIQUE: Multiplanar, multiecho pulse sequences of the abdomen and pelvis were obtained with intravenous contrast. Angiographic images of abdomen and pelvis were obtained using MRA technique with intravenous contrast. Venous phase imaging was obtained. CONTRAST:  32mL GADAVIST GADOBUTROL 1 MMOL/ML IV SOLN COMPARISON:  CT abdomen/pelvis November 27, 2019 FINDINGS: MRA ABDOMEN FINDINGS Aorta: Normal caliber aorta without evidence of dissection, aneurysm or significant stenosis. Celiac axis: Patent.  Conventional hepatic arterial anatomy. SMA: Widely patent. Renals: There are 2 right-sided renal arteries and a single left-sided renal artery. Renal arteries are patent without significant stenosis, aneurysm, dissection or evidence of fibromuscular dysplasia. IMA: Not well seen. Iliac arteries: Normal in caliber. No evidence of stenosis, aneurysm or dissection. Venous: Portal venous phase imaging demonstrates patency of the main portal vein and intrahepatic portal venous branches. No evidence of portal vein thrombosis. Additionally, the hepatic veins are also patent. The hepatic veins and intrahepatic IVC are slightly compressed, likely secondary to relative hepatic steatosis and hepatomegaly. No significant varices are visualized. MRI ABD FINDINGS Hepatomegaly. The liver measures up to 29 cm in craniocaudal dimension. The  hepatic parenchyma is slightly hyperintense on T1 weighted imaging, hypointense on T2 weighted imaging and demonstrates significant signal dropout on fat saturation images. Findings are consistent with hepatic steatosis. No discrete hepatic lesion identified. No intra or  extrahepatic biliary ductal dilatation. Unremarkable pancreas without evidence of inflammation or mass. The spleen is normal in size. Kidneys and adrenal glands are unremarkable in appearance. No evidence of bowel obstruction or focal bowel abnormality. The visualized lower chest is unremarkable. No focal abnormal signal or enhancement within the visualized musculoskeletal structures. IMPRESSION: 1. The portal and hepatic veins remain patent. No evidence of portal venous thrombosis. 2. Hepatomegaly with marked steatosis. 3. No evidence of discrete hepatic lesion. Electronically Signed   By: Malachy Moan M.D.   On: 11/28/2019 08:12        Scheduled Meds: . amoxicillin-clavulanate  800 mg Oral Q12H  . Chlorhexidine Gluconate Cloth  6 each Topical Daily  . folic acid  1 mg Oral Daily  . heparin  5,000 Units Subcutaneous Q8H  . lactulose  30 g Oral BID  . mouth rinse  15 mL Mouth Rinse BID  . methylPREDNISolone (SOLU-MEDROL) injection  40 mg Intravenous Daily  . multivitamin-lutein  1 capsule Oral Daily  . oxcarbazepine  600 mg Oral BID  . pantoprazole (PROTONIX) IV  40 mg Intravenous Q24H  . rifaximin  550 mg Oral BID  . sodium chloride flush  10-40 mL Intracatheter Q12H  . spironolactone  25 mg Oral Daily  . thiamine  100 mg Oral Daily   Or  . thiamine  100 mg Intravenous Daily   Continuous Infusions: . dextrose 5 % and 0.9 % NaCl with KCl 20 mEq/L Stopped (11/27/19 2335)     LOS: 8 days    Time spent: 36 minutes    Marrion Coy, MD [facility]   To contact the attending provider between 7A-7P or the covering provider during after hours 7P-7A, please log into the web site [email or web address] and access using universal [place] password for that web site. If you do not have the password, please call the hospital operator.  11/29/2019, 8:43 AM

## 2019-11-29 NOTE — Progress Notes (Signed)
Arlyss Repress, MD [address]  [address]  [place], [place] [address]  Main: [phone]  Fax: [phone] Pager: [phone]   Subjective: Patient remains confused and altered.  Not following commands.  She ate very little breakfast this morning per her nurse.  Objective: Vital signs in last 24 hours: Vitals:   11/29/19 0027 11/29/19 0412 11/29/19 0758 11/29/19 1109  BP: 114/83 90/67 93/77  106/72  Pulse: (!) 106 91 92 94  Resp: Temp: 97.9 F (36.6 C) 97.9 F (36.6 C) 97.7 F (36.5 C) 98 F (36.7 C)  TempSrc: Oral  Oral Oral  SpO2: 98% 100% 98% 97%  Weight:      Height:       Weight change:   Intake/Output Summary (Last 24 hours) at 11/29/2019 1513 Last data filed at 11/29/2019 1437 Gross per 24 hour  Intake 240 ml  Output 200 ml  Net 40 ml     Exam: Heart:: Regular rate and rhythm or S1S2 present Lungs: normal and clear to auscultation Abdomen: soft, nontender, normal bowel sounds   Lab Results: CBC Latest Ref Rng & Units 11/29/2019 11/27/2019 11/26/2019  WBC 4.0 - 10.5 K/uL 9.0 12.2(H) 12.1(H)  Hemoglobin 12.0 - 15.0 g/dL 9.0(L) 8.3(L) 8.4(L)  Hematocrit 36 - 46 % 29.7(L) 25.5(L) 25.2(L)  Platelets 150 - 400 K/uL 92(L) 129(L) 148(L)   CMP Latest Ref Rng & Units 11/29/2019 11/28/2019 11/27/2019  Glucose 70 - 99 mg/dL 98 95 962(X)  BUN 6 - 20 mg/dL 6 <5(M) <8(U)  Creatinine 0.44 - 1.00 mg/dL UNABLE TO REPORT DUE TO ICTERIC INTERFERENCE SDR UNABLE TO REPORT DUE TO ICTERIC INTERFERENCE SDR UNABLE TO REPORT DUE TO ICTERUS  Sodium 135 - 145 mmol/L 139 136 136  Potassium 3.5 - 5.1 mmol/L 3.8 3.4(L) 3.3(L)  Chloride 98 - 111 mmol/L 108 104 100  CO2 22 - 32 mmol/L 21(L) 22 26  Calcium 8.9 - 10.3 mg/dL 7.8(L) 8.1(L) 7.9(L)  Total Protein 6.5 - 8.1 g/dL 5.3(L) 5.7(L) 5.5(L)  Total Bilirubin 0.3 - 1.2 mg/dL 25.5(HH) 26.8(HH) 24.8(HH)  Alkaline Phos 38 - 126 U/L 385(H) 433(H) 449(H)  AST 15 - 41 U/L 159(H) 180(H) 204(H)  ALT 0 - 44 U/L  52(H) 55(H) 58(H)    Micro Results: Recent Results (from the past 240 hour(s))  Blood culture (routine x 2)     Status: None   Collection Time: 11-24-19  3:49 PM   Specimen: BLOOD  Result Value Ref Range Status   Specimen Description BLOOD BLOOD RIGHT HAND  Final   Special Requests   Final    BOTTLES DRAWN AEROBIC AND ANAEROBIC Blood Culture adequate volume   Culture   Final    NO GROWTH 5 DAYS Performed at [facility], [address] [place], [place] [address]    Report Status 11/26/2019 FINAL  Final  Blood culture (routine x 2)     Status: None   Collection Time: 11/24/2019  4:40 PM   Specimen: BLOOD  Result Value Ref Range Status   Specimen Description BLOOD LEFT SHOULDER  Final   Special Requests   Final    BOTTLES DRAWN AEROBIC AND ANAEROBIC Blood Culture adequate volume   Culture   Final    NO GROWTH 5 DAYS Performed at [facility], [address], [place], [place] [address]    Report Status 11/26/2019 FINAL  Final  Respiratory Panel by RT PCR (Flu A&B, Covid) - Nasopharyngeal Swab  Status: None   Collection Time: 11/05/2019  8:45 PM   Specimen: Nasopharyngeal Swab  Result Value Ref Range Status   SARS Coronavirus 2 by RT PCR NEGATIVE NEGATIVE Final    Comment: (NOTE) SARS-CoV-2 target nucleic acids are NOT DETECTED.  The SARS-CoV-2 RNA is generally detectable in upper respiratoy specimens during the acute phase of infection. The lowest concentration of SARS-CoV-2 viral copies this assay can detect is 131 copies/mL. A negative result does not preclude SARS-Cov-2 infection and should not be used as the sole basis for treatment or other patient management decisions. A negative result may occur with  improper specimen collection/handling, submission of specimen other than nasopharyngeal swab, presence of viral mutation(s) within the areas targeted by this assay, and inadequate number of viral copies (<131 copies/mL). A negative result must be  combined with clinical observations, patient history, and epidemiological information. The expected result is Negative.  Fact Sheet for Patients:  https://www.moore.com/  Fact Sheet for Healthcare Providers:  https://www.young.biz/  This test is no t yet approved or cleared by the [place] FDA and  has been authorized for detection and/or diagnosis of SARS-CoV-2 by FDA under an Emergency Use Authorization (EUA). This EUA will remain  in effect (meaning this test can be used) for the duration of the COVID-19 declaration under Section 564(b)(1) of the Act, 21 U.S.C. section 360bbb-3(b)(1), unless the authorization is terminated or revoked sooner.     Influenza A by PCR NEGATIVE NEGATIVE Final   Influenza B by PCR NEGATIVE NEGATIVE Final    Comment: (NOTE) The Xpert Xpress SARS-CoV-2/FLU/RSV assay is intended as an aid in  the diagnosis of influenza from Nasopharyngeal swab specimens and  should not be used as a sole basis for treatment. Nasal washings and  aspirates are unacceptable for Xpert Xpress SARS-CoV-2/FLU/RSV  testing.  Fact Sheet for Patients: https://www.moore.com/  Fact Sheet for Healthcare Providers: https://www.young.biz/  This test is not yet approved or cleared by the [place] FDA and  has been authorized for detection and/or diagnosis of SARS-CoV-2 by  FDA under an Emergency Use Authorization (EUA). This EUA will remain  in effect (meaning this test can be used) for the duration of the  Covid-19 declaration under Section 564(b)(1) of the Act, 21  U.S.C. section 360bbb-3(b)(1), unless the authorization is  terminated or revoked. Performed at [facility], [address] [place], [place] [address]   MRSA PCR Screening     Status: None   Collection Time: 11/22/19 12:40 AM   Specimen: Nasal Mucosa; Nasopharyngeal  Result Value Ref Range Status   MRSA by PCR  NEGATIVE NEGATIVE Final    Comment:        The GeneXpert MRSA Assay (FDA approved for NASAL specimens only), is one component of a comprehensive MRSA colonization surveillance program. It is not intended to diagnose MRSA infection nor to guide or monitor treatment for MRSA infections. Performed at [facility], [address] [place], [place] [address]   Urine Culture     Status: Abnormal   Collection Time: 11/22/19  1:50 AM   Specimen: Urine, Random  Result Value Ref Range Status   Specimen Description   Final    URINE, RANDOM Performed at [facility], [address] [place], [place] [address]    Special Requests   Final    NONE Performed at [facility], [address] [place], [place] [address]    Culture MULTIPLE SPECIES PRESENT, SUGGEST RECOLLECTION (A)  Final  Report Status 11/23/2019 FINAL  Final  Culture, blood (routine x 2)     Status: None (Preliminary result)   Collection Time: 11/27/19  1:48 PM   Specimen: BLOOD  Result Value Ref Range Status   Specimen Description BLOOD RIGHT ANTECUBITAL  Final   Special Requests   Final    BOTTLES DRAWN AEROBIC AND ANAEROBIC Blood Culture results may not be optimal due to an inadequate volume of blood received in culture bottles   Culture   Final    NO GROWTH 2 DAYS Performed at [facility], [address] [place], [place] [address]    Report Status PENDING  Incomplete  Culture, blood (routine x 2)     Status: None (Preliminary result)   Collection Time: 11/27/19  1:54 PM   Specimen: BLOOD  Result Value Ref Range Status   Specimen Description BLOOD BLOOD RIGHT HAND  Final   Special Requests   Final    BOTTLES DRAWN AEROBIC AND ANAEROBIC Blood Culture adequate volume   Culture   Final    NO GROWTH 2 DAYS Performed at [facility], [address] [place], [place] [address]    Report Status PENDING  Incomplete   Studies/Results: MR MRV ABDOMEN W WO  CONTRAST  Result Date: 11/28/2019 CLINICAL DATA:  39 year old female with decompensated alcoholic cirrhosis. Evaluate for portal vein thrombosis. EXAM: MRA ABDOMEN AND PELVIS WITH CONTRAST TECHNIQUE: Multiplanar, multiecho pulse sequences of the abdomen and pelvis were obtained with intravenous contrast. Angiographic images of abdomen and pelvis were obtained using MRA technique with intravenous contrast. Venous phase imaging was obtained. CONTRAST:  10mL GADAVIST GADOBUTROL 1 MMOL/ML IV SOLN COMPARISON:  CT abdomen/pelvis 08/09/2019 FINDINGS: MRA ABDOMEN FINDINGS Aorta: Normal caliber aorta without evidence of dissection, aneurysm or significant stenosis. Celiac axis: Patent.  Conventional hepatic arterial anatomy. SMA: Widely patent. Renals: There are 2 right-sided renal arteries and a single left-sided renal artery. Renal arteries are patent without significant stenosis, aneurysm, dissection or evidence of fibromuscular dysplasia. IMA: Not well seen. Iliac arteries: Normal in caliber. No evidence of stenosis, aneurysm or dissection. Venous: Portal venous phase imaging demonstrates patency of the main portal vein and intrahepatic portal venous branches. No evidence of portal vein thrombosis. Additionally, the hepatic veins are also patent. The hepatic veins and intrahepatic IVC are slightly compressed, likely secondary to relative hepatic steatosis and hepatomegaly. No significant varices are visualized. MRI ABD FINDINGS Hepatomegaly. The liver measures up to 29 cm in craniocaudal dimension. The hepatic parenchyma is slightly hyperintense on T1 weighted imaging, hypointense on T2 weighted imaging and demonstrates significant signal dropout on fat saturation images. Findings are consistent with hepatic steatosis. No discrete hepatic lesion identified. No intra or extrahepatic biliary ductal dilatation. Unremarkable pancreas without evidence of inflammation or mass. The spleen is normal in size. Kidneys and  adrenal glands are unremarkable in appearance. No evidence of bowel obstruction or focal bowel abnormality. The visualized lower chest is unremarkable. No focal abnormal signal or enhancement within the visualized musculoskeletal structures. IMPRESSION: 1. The portal and hepatic veins remain patent. No evidence of portal venous thrombosis. 2. Hepatomegaly with marked steatosis. 3. No evidence of discrete hepatic lesion. Electronically Signed   By: Malachy MoanHeath  McCullough M.D.   On: 11/28/2019 08:12   Medications:  I have reviewed the patient's current medications. Prior to Admission:  Medications Prior to Admission  Medication Sig Dispense Refill Last Dose   amLODipine (NORVASC) 5 MG tablet Take 1 tablet (5 mg total) by mouth daily.  30 tablet 0 Past Month at Unknown time   Docusate Sodium (DSS) 100 MG CAPS Take 100 mg by mouth daily.    Past Month at Unknown time   folic acid (FOLVITE) 1 MG tablet Take 1 tablet (1 mg total) by mouth daily. 30 tablet 0 Past Month at Unknown time   gabapentin (NEURONTIN) 100 MG capsule Take 1 capsule (100 mg total) by mouth 3 (three) times daily. 90 capsule 0 Past Month at Unknown time   hydrOXYzine (ATARAX/VISTARIL) 50 MG tablet Take 50 mg by mouth 3 (three) times daily as needed.   Past Month at Unknown time   lisinopril (ZESTRIL) 10 MG tablet Take 1 tablet (10 mg total) by mouth daily. 30 tablet 0 Past Month at Unknown time   Melatonin 3 MG TABS Take 3 mg by mouth at bedtime.   Past Month at Unknown time   Multiple Vitamin (MULTI-VITAMIN) tablet Take 1 tablet by mouth daily.   Past Month at Unknown time   omeprazole (PRILOSEC) 20 MG capsule Take 20 mg by mouth daily.    Past Month at Unknown time   ondansetron (ZOFRAN) 4 MG tablet Take 1 tablet (4 mg total) by mouth every 6 (six) hours as needed for nausea. 20 tablet 0 Past Month at Unknown time   oxcarbazepine (TRILEPTAL) 600 MG tablet Take 1 tablet (600 mg total) by mouth 2 (two) times daily. 30 tablet 0  Past Month at Unknown time   rifaximin (XIFAXAN) 550 MG TABS tablet Take 1 tablet (550 mg total) by mouth 2 (two) times daily. 60 tablet 0 Past Month at Unknown time   sertraline (ZOLOFT) 50 MG tablet Take 1 tablet (50 mg total) by mouth daily. 30 tablet 0 Past Month at Unknown time   thiamine 100 MG tablet Take 100 mg by mouth daily.    Past Month at Unknown time   zinc gluconate 50 MG tablet Take 50 mg by mouth daily.    Past Month at Unknown time   clotrimazole (LOTRIMIN) 1 % external solution Apply 1 application topically 2 (two) times daily.   prn at prn   diclofenac sodium (VOLTAREN) 1 % GEL Apply 2 g topically 4 (four) times daily as needed (pain).   prn at prn   diphenhydrAMINE (BENADRYL) 25 mg capsule Take 25 mg by mouth every 6 (six) hours as needed for itching or allergies.    prn at prn   feeding supplement, ENSURE ENLIVE, (ENSURE ENLIVE) LIQD Take 237 mLs by mouth 2 (two) times daily between meals. 237 mL 12    lactulose (CHRONULAC) 10 GM/15ML solution Take 30 mLs (20 g total) by mouth 2 (two) times daily as needed for mild constipation. 236 mL 0 prn at prn   Scheduled:  Chlorhexidine Gluconate Cloth  6 each Topical Daily   folic acid  1 mg Oral Daily   heparin  5,000 Units Subcutaneous Q8H   lactulose  30 g Oral BID   mouth rinse  15 mL Mouth Rinse BID   megestrol  400 mg Oral Daily   methylPREDNISolone (SOLU-MEDROL) injection  40 mg Intravenous Daily   multivitamin-lutein  1 capsule Oral Daily   oxcarbazepine  600 mg Oral BID   pantoprazole (PROTONIX) IV  40 mg Intravenous Q24H   rifaximin  550 mg Oral BID   sertraline  50 mg Oral Daily   sodium chloride flush  10-40 mL Intracatheter Q12H   spironolactone  25 mg Oral Daily   thiamine  100  mg Oral Daily   Or   thiamine  100 mg Intravenous Daily   Continuous:  dextrose 5 % and 0.9 % NaCl with KCl 20 mEq/L 75 mL/hr at 11/29/19 1117   GLO:[id number], ondansetron (ZOFRAN) IV,  polyethylene glycol, sodium chloride flush, white petrolatum Anti-infectives (From admission, onward)   Start     Dose/Rate Route Frequency Ordered Stop   11/26/19 2200  amoxicillin-clavulanate (AUGMENTIN) 400-57 MG/5ML suspension 800 mg  Status:  Discontinued        800 mg Oral Every 12 hours 11/26/19 1559 11/29/19 0848   11/22/19 0030  piperacillin-tazobactam (ZOSYN) IVPB 3.375 g  Status:  Discontinued        3.375 g 12.5 mL/hr over 240 Minutes Intravenous Every 8 hours 11/27/2019 2334 11/25/19 1007   11-27-19 2200  rifaximin (XIFAXAN) tablet 550 mg        550 mg Oral 2 times daily 27-Nov-2019 2055     11/27/2019 1730  cefTRIAXone (ROCEPHIN) 2 g in sodium chloride 0.9 % 100 mL IVPB        2 g 200 mL/hr over 30 Minutes Intravenous  Once 11/27/19 1715 November 27, 2019 1938     Scheduled Meds:  Chlorhexidine Gluconate Cloth  6 each Topical Daily   folic acid  1 mg Oral Daily   heparin  5,000 Units Subcutaneous Q8H   lactulose  30 g Oral BID   mouth rinse  15 mL Mouth Rinse BID   megestrol  400 mg Oral Daily   methylPREDNISolone (SOLU-MEDROL) injection  40 mg Intravenous Daily   multivitamin-lutein  1 capsule Oral Daily   oxcarbazepine  600 mg Oral BID   pantoprazole (PROTONIX) IV  40 mg Intravenous Q24H   rifaximin  550 mg Oral BID   sertraline  50 mg Oral Daily   sodium chloride flush  10-40 mL Intracatheter Q12H   spironolactone  25 mg Oral Daily   thiamine  100 mg Oral Daily   Or   thiamine  100 mg Intravenous Daily   Continuous Infusions:  dextrose 5 % and 0.9 % NaCl with KCl 20 mEq/L 75 mL/hr at 11/29/19 1117   PRN Meds:.ipratropium-albuterol, ondansetron (ZOFRAN) IV, polyethylene glycol, sodium chloride flush, white petrolatum   Assessment: Active Problems:   Hyponatremia with decreased serum osmolality   Alcoholic cirrhosis of liver with ascites (HCC)   Hepatic encephalopathy (HCC)   Goals of care, counseling/discussion   Palliative care by specialist   DNR (do  not resuscitate)   Seizure (HCC)   Plan: Severe alcoholic hepatitis: Meld-Na 26 Patient is commenced on methylprednisolone IV on 10/24 due to discriminant score of 47.8.  Will calculate Lille score on day 5 which is 10/28 to assess response to prednisone.  Steroids only help to improve 30-day mortality in 50% of the cases.  They are not effective long-term mortality LFTs slightly improved today Continue lactulose and rifaximin No evidence of significant ascites to tap No evidence of portal, hepatic or splenic vein thrombosis Watch for signs of bleeding Monitor urine output as serum BUN/creatinine cannot be assessed due to hyperbilirubinemia Poor p.o. intake secondary to altered mental status.  Can attempt tube feeds with Dobbhoff placement  Altered mental status: Likely multifactorial Recent seizure activity secondary to severe hyponatremia and hepatic encephalopathy Continue antiepileptic medications and Lactulose and rifaximin daily  Overall prognosis is guarded due to active alcohol use.  Appreciate palliative care consult.  Patient is not a liver transplant candidate due to active alcohol use   LOS: 8  days   Fain Francis 11/29/2019, 3:13 PM

## 2019-11-29 NOTE — Plan of Care (Signed)
  Problem: Clinical Measurements: Goal: Will remain free from infection Outcome: Progressing Goal: Respiratory complications will improve Outcome: Progressing   Problem: Skin Integrity: Goal: Risk for impaired skin integrity will decrease Outcome: Progressing   

## 2019-11-30 ENCOUNTER — Inpatient Hospital Stay: Payer: No Typology Code available for payment source

## 2019-11-30 DIAGNOSIS — E440 Moderate protein-calorie malnutrition: Secondary | ICD-10-CM

## 2019-11-30 DIAGNOSIS — Z7189 Other specified counseling: Secondary | ICD-10-CM | POA: Diagnosis not present

## 2019-11-30 DIAGNOSIS — Z515 Encounter for palliative care: Secondary | ICD-10-CM | POA: Diagnosis not present

## 2019-11-30 DIAGNOSIS — K7031 Alcoholic cirrhosis of liver with ascites: Secondary | ICD-10-CM | POA: Diagnosis not present

## 2019-11-30 DIAGNOSIS — E43 Unspecified severe protein-calorie malnutrition: Secondary | ICD-10-CM | POA: Diagnosis present

## 2019-11-30 DIAGNOSIS — K7011 Alcoholic hepatitis with ascites: Secondary | ICD-10-CM | POA: Diagnosis not present

## 2019-11-30 LAB — HEPATIC FUNCTION PANEL
ALT: 48 U/L — ABNORMAL HIGH (ref 0–44)
AST: 135 U/L — ABNORMAL HIGH (ref 15–41)
Albumin: 2.1 g/dL — ABNORMAL LOW (ref 3.5–5.0)
Alkaline Phosphatase: 343 U/L — ABNORMAL HIGH (ref 38–126)
Bilirubin, Direct: 13.1 mg/dL — ABNORMAL HIGH (ref 0.0–0.2)
Indirect Bilirubin: 9.4 mg/dL — ABNORMAL HIGH (ref 0.3–0.9)
Total Bilirubin: 22.5 mg/dL (ref 0.3–1.2)
Total Protein: 5.2 g/dL — ABNORMAL LOW (ref 6.5–8.1)

## 2019-11-30 LAB — BASIC METABOLIC PANEL
Anion gap: 11 (ref 5–15)
BUN: 8 mg/dL (ref 6–20)
CO2: 20 mmol/L — ABNORMAL LOW (ref 22–32)
Calcium: 7.9 mg/dL — ABNORMAL LOW (ref 8.9–10.3)
Chloride: 113 mmol/L — ABNORMAL HIGH (ref 98–111)
Creatinine, Ser: UNDETERMINED mg/dL (ref 0.44–1.00)
Glucose, Bld: 117 mg/dL — ABNORMAL HIGH (ref 70–99)
Potassium: 3.4 mmol/L — ABNORMAL LOW (ref 3.5–5.1)
Sodium: 144 mmol/L (ref 135–145)

## 2019-11-30 LAB — GLUCOSE, CAPILLARY
Glucose-Capillary: 194 mg/dL — ABNORMAL HIGH (ref 70–99)
Glucose-Capillary: 98 mg/dL (ref 70–99)
Glucose-Capillary: 141 mg/dL — ABNORMAL HIGH (ref 70–99)
Glucose-Capillary: 132 mg/dL — ABNORMAL HIGH (ref 70–99)
Glucose-Capillary: 129 mg/dL — ABNORMAL HIGH (ref 70–99)

## 2019-11-30 LAB — PHOSPHORUS: Phosphorus: UNDETERMINED mg/dL (ref 2.5–4.6)

## 2019-11-30 LAB — MAGNESIUM: Magnesium: 2.3 mg/dL (ref 1.7–2.4)

## 2019-11-30 LAB — AMMONIA: Ammonia: 58 umol/L — ABNORMAL HIGH (ref 9–35)

## 2019-11-30 IMAGING — RF DG NASO G TUBE PLC W/FL W/RAD
2 series · 2 of 2 positions shown · IV contrast (omnipaque)
Comparison: [DATE] CT abdomen pelvis.

CLINICAL DATA: NG tube placement.

EXAM:
NASO G TUBE PLACEMENT WITH FL AND WITH RAD
CONTRAST:  5 cc Omnipaque 300
FLUOROSCOPY TIME:  Fluoroscopy Time:  1 minutes and 24 seconds.
Number of Acquired Spot Images: 2

[Series 2: cp_standard · 0.19mm/px · 1 of 1 slices shown]
[im 1/1]
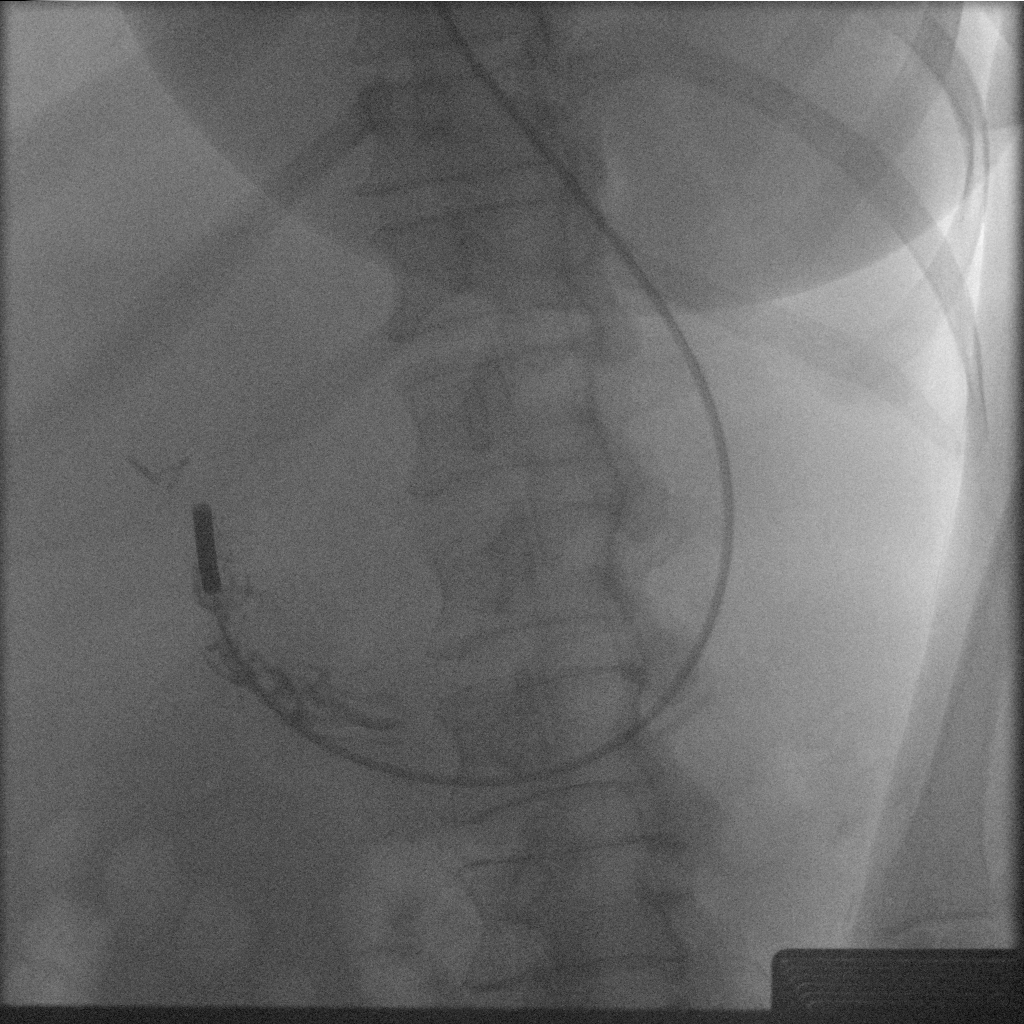

[Series 4: fluoro_iodine_singleshot_bw · 0.19mm/px · 1 of 1 slices shown]
[im 1/1]
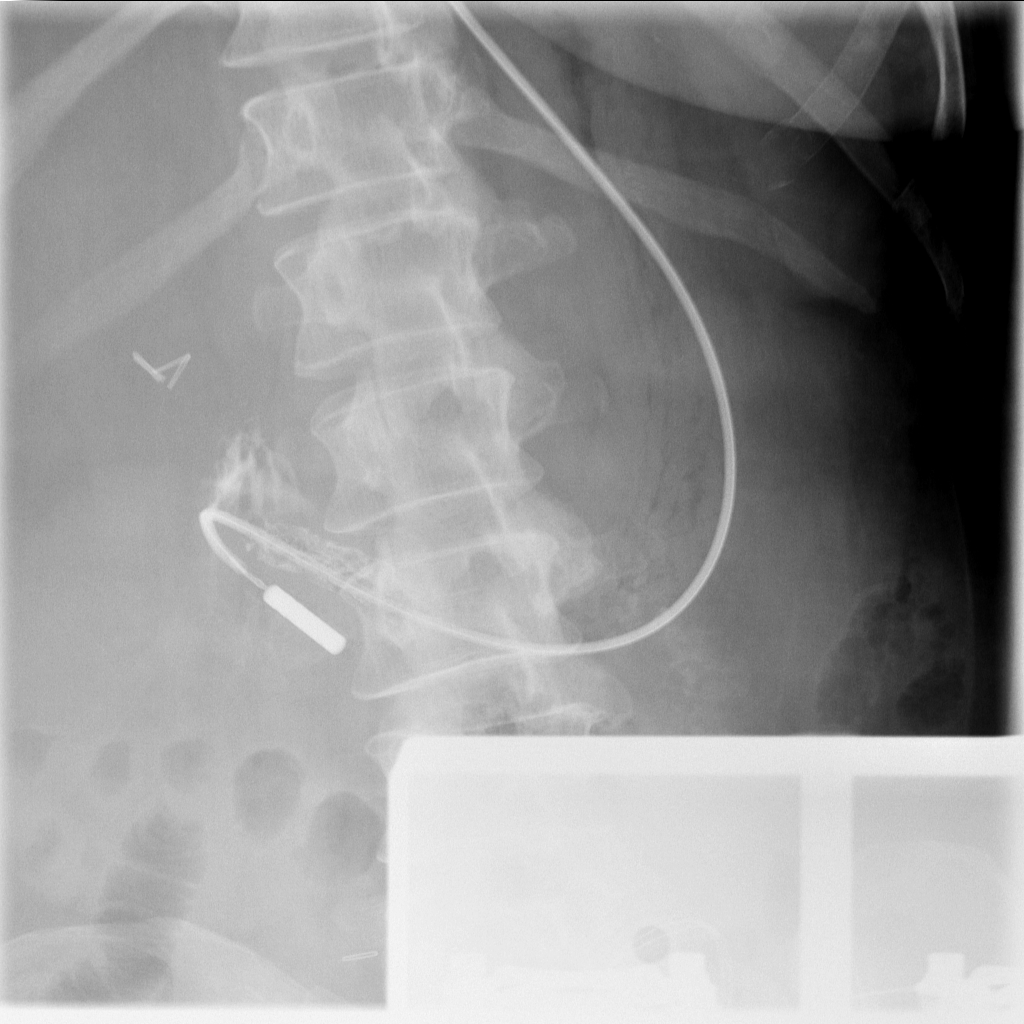

[2 of 2 positions shown; findings below may reference images not displayed]

FINDINGS: An 8 French Dobbhoff tube was placed into the left nasal cavity and
advanced into the proximal duodenum. High-resolution spot image
demonstrates the tube overlying the proximal duodenum. 5 cc of
Omnipaque 300 was injected into the tube to confirm positioning
within the proximal small bowel.

The patient tolerated the procedure without complication.
IMPRESSION: Technically successful placement of 8 Fr Dobbhoff tube terminating
within the proximal duodenum.

## 2019-11-30 MED ORDER — OXCARBAZEPINE 300 MG PO TABS
600.0000 mg | ORAL_TABLET | Freq: Two times a day (BID) | ORAL | Status: DC
  Administered 2019-12-01: 09:00:00 600 mg
  Filled 2019-11-30: qty 2

## 2019-11-30 MED ORDER — OSMOLITE 1.2 CAL PO LIQD: 1000.0000 mL | ORAL | Status: DC

## 2019-11-30 MED ORDER — FOLIC ACID 1 MG PO TABS
1.0000 mg | ORAL_TABLET | Freq: Every day | ORAL | Status: DC
  Administered 2019-12-01 – 2019-12-04 (×4): 1 mg
  Filled 2019-11-30 (×4): qty 1

## 2019-11-30 MED ORDER — IOHEXOL 300 MG/ML  SOLN
25.0000 mL | Freq: Once | INTRAMUSCULAR | Status: AC | PRN
  Administered 2019-11-30: 25 mL

## 2019-11-30 MED ORDER — OCUVITE-LUTEIN PO CAPS
1.0000 | ORAL_CAPSULE | Freq: Every day | ORAL | Status: DC
  Administered 2019-12-01 – 2019-12-04 (×4): 1
  Filled 2019-11-30 (×6): qty 1

## 2019-11-30 MED ORDER — SERTRALINE HCL 50 MG PO TABS
50.0000 mg | ORAL_TABLET | Freq: Every day | ORAL | Status: DC
  Administered 2019-12-01: 50 mg
  Filled 2019-11-30: qty 1

## 2019-11-30 MED ORDER — THIAMINE HCL 100 MG/ML IJ SOLN
100.0000 mg | Freq: Every day | INTRAMUSCULAR | Status: DC
  Administered 2019-12-02: 100 mg via INTRAVENOUS
  Filled 2019-11-30: qty 2

## 2019-11-30 MED ORDER — RIFAXIMIN 550 MG PO TABS
550.0000 mg | ORAL_TABLET | Freq: Two times a day (BID) | ORAL | Status: DC
  Administered 2019-12-01 – 2019-12-04 (×9): 550 mg
  Filled 2019-11-30 (×8): qty 1

## 2019-11-30 MED ORDER — POLYETHYLENE GLYCOL 3350 17 G PO PACK: 17.0000 g | PACK | Freq: Every day | ORAL | Status: DC | PRN

## 2019-11-30 MED ORDER — SPIRONOLACTONE 25 MG PO TABS
25.0000 mg | ORAL_TABLET | Freq: Every day | ORAL | Status: DC
  Administered 2019-12-01 – 2019-12-02 (×2): 25 mg
  Filled 2019-11-30 (×2): qty 1

## 2019-11-30 MED ORDER — THIAMINE HCL 100 MG PO TABS
100.0000 mg | ORAL_TABLET | Freq: Every day | ORAL | Status: DC
  Administered 2019-12-01 – 2019-12-04 (×3): 100 mg
  Filled 2019-11-30 (×3): qty 1

## 2019-11-30 MED ORDER — OSMOLITE 1.5 CAL PO LIQD
1000.0000 mL | ORAL | Status: DC
  Administered 2019-11-30: 1000 mL

## 2019-11-30 MED ORDER — PROSOURCE TF PO LIQD
45.0000 mL | Freq: Three times a day (TID) | ORAL | Status: DC
  Administered 2019-12-01 – 2019-12-04 (×11): 45 mL
  Filled 2019-11-30 (×14): qty 45

## 2019-11-30 MED ORDER — POTASSIUM CHLORIDE 20 MEQ PO PACK
40.0000 meq | PACK | Freq: Once | ORAL | Status: DC
  Filled 2019-11-30: qty 2

## 2019-11-30 MED ORDER — LACTULOSE 10 GM/15ML PO SOLN
30.0000 g | Freq: Two times a day (BID) | ORAL | Status: DC
  Administered 2019-12-01: 09:00:00 30 g
  Filled 2019-11-30: qty 60

## 2019-11-30 MED ORDER — MEGESTROL ACETATE 400 MG/10ML PO SUSP
400.0000 mg | Freq: Every day | ORAL | Status: DC
  Administered 2019-12-01: 400 mg
  Filled 2019-11-30 (×2): qty 10

## 2019-11-30 NOTE — Progress Notes (Signed)
Initial Nutrition Assessment  DOCUMENTATION CODES:   Non-severe (moderate) malnutrition in context of acute illness/injury, Obesity unspecified  INTERVENTION:  Once small-bore feeding tube placed and confirmed recommend: -Initiate Osmolite 1.5 Cal at 20 mL/hr and advance by 20 mL/hr every 24 hours to goal rate of 60 mL/hr -Provide PROSource TF 45 mL TID per tube -Goal regimen provides 2280 kcal, 123 grams of protein, 1094 mL H2O daily -Provide minimum free water flush of 20-30 mL to maintain tube patency; further free water flush deferred to MD in setting of hyponatremia on admission -Slow advancement is needed as patient is at risk for refeeding syndrome and phosphorus cannot be measured at this time  Monitor magnesium, potassium, and phosphorus daily for at least 3 days, MD to replete as needed, as pt is at risk for refeeding syndrome given EtOH abuse, prolonged poor PO intake.  NUTRITION DIAGNOSIS:   Moderate Malnutrition related to acute illness (hepatic encephalopathy, poor PO intake) as evidenced by energy intake < or equal to 50% for > or equal to 5 days, mild muscle depletion.  GOAL:   Patient will meet greater than or equal to 90% of their needs  MONITOR:   PO intake, Diet advancement, Labs, Weight trends, TF tolerance, I & O's  REASON FOR ASSESSMENT:   Rounds    ASSESSMENT:   39 year old female with PMHx of EtOH abuse, HTN, PTSD, bipolar 1 disorder, seizures, alcohol hepatitis admitted with hyponatremia, hepatic encephalopathy, alcohol liver cirrhosis with ascites, aspiration PNA.   10/23 diet advanced to regular 10/26 diet downgraded to dysphagia 1 with nectar-thick liquids per SLP 10/27 patient made NPO per SLP  Per GI note patient is not a liver transplant candidate due to active EtOH use.  Discussed with SLP. Patient with poor PO intake this admission and now being made NPO. MD is discussing goals of care with family. Also discussed with RN. Family decided on  placement of small-bore feeding tube for initiation of tube feeds and medications. RN attempted bedside placement of Dobbhoff tube x 2 in each nare but placement was unsuccessful. MD is ordering placement under fluoroscopy by IR. Per MD plan is for initiation of tube feeds.  Met with patient at bedside. No family members present at time of RD assessment. Patient is slow to respond to questions but is able to answer some questions. She reports that PTA she had a good appetite and intake. However she is unable to provide specifics on usual PO intake. Patient has now had poor PO intake x 9 days this admission. Discussed plan for placement of small-bore feeding tube for nutrition and medication administration. Encouraged patient to leave tube in nare once it is placed and to not pull it out. There is concern patient will pull tube out and [facility] does not currently carry the AMT Bridle securement device.  Patient denies any unintentional weight loss and reports she has actually been gaining weight. She is unable to provide specifics. Per chart patient's weight fluctuates between 82-90 kg in the last several years. She is currently documented to be 90.6 kg (199.74 lbs). Patient does have some edema so current weight may be falsely elevated. Noted patient was 82.1 kg on 10/24/2019 and that is likely closer to dry weight.  Medications reviewed and include: folic acid 1 mg daily PO, lactulose 30 grams BID, Megace 400 mg daily, Solu-Medrol 40 mg daily IV, Ocuvite daily, Protonix, spironolactone, thiamine 100 mg daily, D5-NS with KCl 20 mEq/L at 75 mL/hr.  Labs  reviewed: CBG 98-194, Potassium 3.4, Chloride 113, CO2 20.  RD suspects malnutrition may be more chronic in nature but at this time there is only evidence for acute malnutrition from what information patient is able to provide and current NFPE findings.  NUTRITION - FOCUSED PHYSICAL EXAM:    Most Recent Value  Orbital Region No depletion  Upper Arm Region  Mild depletion  Thoracic and Lumbar Region Unable to assess  Buccal Region No depletion  Temple Region No depletion  Clavicle Bone Region Mild depletion  Clavicle and Acromion Bone Region Mild depletion  Scapular Bone Region No depletion  Dorsal Hand Unable to assess  Patellar Region Mild depletion  Anterior Thigh Region Mild depletion  Posterior Calf Region Mild depletion  Edema (RD Assessment) Mild  Hair Reviewed  Eyes Reviewed  Mouth Unable to assess  Skin Reviewed  [jaundice]  Nails Reviewed     Diet Order:   Diet Order            Diet NPO time specified  Diet effective now                EDUCATION NEEDS:   No education needs have been identified at this time  Skin:  Skin Assessment: Reviewed RN Assessment  Last BM:  11/30/2019 - 400 mL per rectal tube  Height:   Ht Readings from Last 1 Encounters:  11/16/2019 5' 3" (1.6 m)   Weight:   Wt Readings from Last 1 Encounters:  11/26/19 90.6 kg   Ideal Body Weight:  52.3 kg  BMI:  Body mass index is 35.38 kg/m.  Estimated Nutritional Needs:   Kcal:  2200-2400  Protein:  120-130 grams  Fluid:  1.8 L/day  Jacklynn Barnacle, MS, RD, LDN Pager number available on Amion

## 2019-11-30 NOTE — Progress Notes (Signed)
attempted x2 to insert dobhoff by myself and Alvan Dame RN, unsuccessful, MD made aware to notify IR to place tube

## 2019-11-30 NOTE — Progress Notes (Signed)
SLP Cancellation Note  Patient Details Name: Ashlee Hawkins MRN: [id number] DOB: Jan 20, 1981   Cancelled treatment:       Reason Eval/Treat Not Completed: Medical issues which prohibited therapy;Patient not medically ready;[name] level of consciousness (chart reviewed; consulted NSG, MD re: pt's status). Per meeting w/ pt and consultation w/ NSG re: State presentation this AM, pt continues to present w/ significant alterted mental status w/ declined State and poor awareness. Her mental status appears to impact organization w/, and volitional/nonvolitional swallowing of, oral intake -- pt exhibits increased oral holding w/ overt s/s of aspiration despite a modified, Dysphagia diet being fed to her by NSG staff.  Due to increased risk for aspiration and Pulmonary decline, recommend Strict NPO status including oral medication until mental status improves. Recommend frequent oral care for hygiene and stimulation of swallowing; aspiration precautions. ST services will continue to follow pt's status next 2-3 days for improvement to re-attempt po trials, oral diet. NSG and MD updated; agreed. Palliative Care is following for GOC.      Jerilynn Som, MS, CCC-SLP Speech Language Pathologist Rehab Services [phone] [facility] 11/30/2019, 11:09 AM

## 2019-11-30 NOTE — Progress Notes (Signed)
Dr Fran Lowes and ST made aware that pt still not able to swallow well, coughed and sneezed, held breakfast

## 2019-11-30 NOTE — Progress Notes (Signed)
PROGRESS NOTE    Ashlee Hawkins  WUJ:[id number] DOB: 11/27/1980 DOA: 11/05/2019 PCP: [facility]   Chief complaint.  Altered mental status. Brief Narrative:  Patient is a 39 year old female with a history of alcohol abuse, seizure disorder and alcohol hepatitis who initially admitedto ICU on 10/18 with a severe hyponatremia with sodium 108, ammonia 106.She has been continuously drinking alcohol, last drink was before admission. She was also having seizures. Since admission to the ICU, sodium level has went up after giving 3% sodium chloride, she was  also giving lactulose.She is very confused, mental status has not improved since admission. Her total bilirubin was 23.3. Ammonia level still elevated. Patient has been seen by palliative care, long-term prognosis is very poor.  10/23.Increase the lactulose dose, startedrehydration. 10/24.Patient mental status seem to be improving today. Still has very high bilirubin level, consult GI. 10/25.  Patient does not have portal vein thrombosis on MRV study.  Start IV steroids per GI.   Assessment & Plan:   Active Problems:   Hyponatremia with decreased serum osmolality   Alcoholic cirrhosis of liver with ascites (HCC)   Hepatic encephalopathy (HCC)   Goals of care, counseling/discussion   Palliative care by specialist   DNR (do not resuscitate)   Seizure (HCC)   Malnutrition of moderate degree  #1.  Hepatic encephalopathy. Patient mental status gradually getting better.   --cont lactulose, rifaxim --cont IV solumedrol, per GI  #2.  Alcohol liver cirrhosis with ascites. Ultrasound did not show large enough ascites to tap, MRI ruled out portal vein thrombosis. Long-term prognosis guarded.  Patient has extreme elevation of bilirubin. --Please note that patient creatinine level and phosphorus level were not able to be measured due to severe elevation of bilirubin. --continue aldactone and steroid  #  dysphagia #3.  Anorexia with a severe protein calorie malnutrition. --currently pt can not swallow safely.  Husband wants to initiate tube feed for now. PLAN: --Dobhoff tube placed by IR today --Start tube feed, per nutrition rec --Need to monitor for refeeding syndrome --consider supplemental phos empirically --cont MIVF for now, will d/c after tube feed reaching full speed  4.  Aspiration pneumonia. Completed antibiotics x 5days.  5.  Severe hyponatremia. Condition has resolved.  6.  Anemia of chronic disease. Continue to follow.  7.  Thrombocytopenia. Secondary to liver cirrhosis.  Stable.  Hypokalemia --replete PRN   DVT prophylaxis: Heparin Code Status: DNR Family Communication: husband updated by palliative care provider   Status is: Inpatient   Dispo: The patient is from: Home              Anticipated d/c is to: Home              Anticipated d/c date is: > 3 days              Patient currently is not medically stable to d/c. Can not safely swallow.  Husband asked to initiate tube feed.  Not ready for comfort care yet.     I/O last 3 completed shifts: In: 0  Out: 200 [Stool:200] Total I/O In: 0  Out: 1100 [Urine:700; Stool:400]     Consultants:   gi  Procedures: none  Antimicrobials: none  Subjective: Pt found to be coughing even with thickened liquid.  SLP rec NPO for now.  Palliative care following and discussed with husband about goals of care, and husband wants to initiate tube feed.  Dobhoff tube place today by IR, and tube feed started.  Objective: Vitals:   11/30/19 0840 11/30/19 0900 11/30/19 1215 11/30/19 1709  BP: 113/77  118/78 118/73  Pulse: 90  88 89  Resp:  16 14 18   Temp: (!) 97.4 F (36.3 C)  97.7 F (36.5 C) 97.7 F (36.5 C)  TempSrc: Oral  Oral Oral  SpO2: 95%  98% 100%  Weight:      Height:        Intake/Output Summary (Last 24 hours) at 11/30/2019 1828 Last data filed at 11/30/2019 1800 Gross per 24 hour   Intake 0 ml  Output 1100 ml  Net -1100 ml   Filed Weights   11/23/19 0347 11/24/19 0351 11/26/19 0408  Weight: 89.7 kg 91.1 kg 90.6 kg    Examination:  Constitutional: NAD, alert, confused, very jaundiced.   HEENT: conjunctivae icterus, and lids normal, EOMI CV: No cyanosis.   RESP: normal respiratory effort, on RA GI: Dobhoff tube present Extremities: No effusions, edema in BLE SKIN: warm, dry and intact Neuro: II - XII grossly intact.     Data Reviewed: I have personally reviewed following labs and imaging studies  CBC: Recent Labs  Lab 11/24/19 0431 11/25/19 0030 11/26/19 0700 11/27/19 0413 11/29/19 0714  WBC 8.5 9.9 12.1* 12.2* 9.0  NEUTROABS 6.1 6.5 7.9* 8.0* 6.1  HGB 8.1* 8.6* 8.4* 8.3* 9.0*  HCT 23.5* 25.6* 25.2* 25.5* 29.7*  MCV 108.3* 110.8* 110.5* 114.3* 119.8*  PLT 173 188 148* 129* 92*   Basic Metabolic Panel: Recent Labs  Lab 11/26/19 0700 11/27/19 0413 11/28/19 0456 11/29/19 0714 11/30/19 0651  NA 132* 136 136 139 144  K 3.4* 3.3* 3.4* 3.8 3.4*  CL 95* 100 104 108 113*  CO2 28 26 22  21* 20*  GLUCOSE 112* 105* 95 98 117*  BUN <5* <5* <5* 6 8  CREATININE UNABLE TO REPORT DUE TO ICTERUS.PMF UNABLE TO REPORT DUE TO ICTERUS  UNABLE TO REPORT DUE TO ICTERIC INTERFERENCE SDR  UNABLE TO REPORT DUE TO ICTERIC INTERFERENCE SDR  UNABLE TO REPORT DUE TO ICTERUS  CALCIUM 8.0* 7.9* 8.1* 7.8* 7.9*  MG 2.1 2.1 2.1 2.2 2.3  PHOS UNABLE TO REPORT DUE TO ICTERUS.PMF UNABLE TO REPORT DUE TO ICTERUS UNABLE TO REPORT DUE TO ICTERIC INTERFERENCE SDR  UNABLE TO REPORT DUE TO ICTERIC INTERFERENCE SDR UNABLE TO REPORT DUE TO ICTERUS   GFR: CrCl cannot be calculated (This lab value cannot be used to calculate CrCl because it is not a number:  UNABLE TO REPORT DUE TO ICTERUS). Liver Function Tests: Recent Labs  Lab 11/26/19 0700 11/27/19 0413 11/28/19 0456 11/29/19 0714 11/30/19 0651  AST 232* 204* 180* 159* 135*  ALT 62* 58* 55* 52* 48*  ALKPHOS 426* 449*  433* 385* 343*  BILITOT 23.3* 24.8* 26.8* 25.5* 22.5*  PROT 5.5* 5.5* 5.7* 5.3* 5.2*  ALBUMIN 2.1* 2.1* 2.2* 2.1* 2.1*   Recent Labs  Lab 11/24/19 0610  LIPASE 80*  AMYLASE 131*   Recent Labs  Lab 11/24/19 0611 11/27/19 0413 11/28/19 0456 11/30/19 0651  AMMONIA 68* 102* 103* 58*   Coagulation Profile: Recent Labs  Lab 11/27/19 1218  INR 1.8*   Cardiac Enzymes: No results for input(s): CKTOTAL, CKMB, CKMBINDEX, TROPONINI in the last 168 hours. BNP (last 3 results) No results for input(s): PROBNP in the last 8760 hours. HbA1C: No results for input(s): HGBA1C in the last 72 hours. CBG: Recent Labs  Lab 11/29/19 2043 11/30/19 0137 11/30/19 0846 11/30/19 1213 11/30/19 1712  GLUCAP 147* 194* 98 141* 132*  Lipid Profile: No results for input(s): CHOL, HDL, LDLCALC, TRIG, CHOLHDL, LDLDIRECT in the last 72 hours. Thyroid Function Tests: No results for input(s): TSH, T4TOTAL, FREET4, T3FREE, THYROIDAB in the last 72 hours. Anemia Panel: No results for input(s): VITAMINB12, FOLATE, FERRITIN, TIBC, IRON, RETICCTPCT in the last 72 hours. Sepsis Labs: Recent Labs  Lab 11/27/19 0413  PROCALCITON 0.75    Recent Results (from the past 240 hour(s))  Blood culture (routine x 2)     Status: None   Collection Time: Nov 26, 2019  3:49 PM   Specimen: BLOOD  Result Value Ref Range Status   Specimen Description BLOOD BLOOD RIGHT HAND  Final   Special Requests   Final    BOTTLES DRAWN AEROBIC AND ANAEROBIC Blood Culture adequate volume   Culture   Final    NO GROWTH 5 DAYS Performed at [facility], [address] [place], [place] [address]    Report Status 11/26/2019 FINAL  Final  Blood culture (routine x 2)     Status: None   Collection Time: 11/26/2019  4:40 PM   Specimen: BLOOD  Result Value Ref Range Status   Specimen Description BLOOD LEFT SHOULDER  Final   Special Requests   Final    BOTTLES DRAWN AEROBIC AND ANAEROBIC Blood Culture adequate volume    Culture   Final    NO GROWTH 5 DAYS Performed at [facility], [address] [place], [place] [address]    Report Status 11/26/2019 FINAL  Final  Respiratory Panel by RT PCR (Flu A&B, Covid) - Nasopharyngeal Swab     Status: None   Collection Time: 11-26-2019  8:45 PM   Specimen: Nasopharyngeal Swab  Result Value Ref Range Status   SARS Coronavirus 2 by RT PCR NEGATIVE NEGATIVE Final    Comment: (NOTE) SARS-CoV-2 target nucleic acids are NOT DETECTED.  The SARS-CoV-2 RNA is generally detectable in upper respiratoy specimens during the acute phase of infection. The lowest concentration of SARS-CoV-2 viral copies this assay can detect is 131 copies/mL. A negative result does not preclude SARS-Cov-2 infection and should not be used as the sole basis for treatment or other patient management decisions. A negative result may occur with  improper specimen collection/handling, submission of specimen other than nasopharyngeal swab, presence of viral mutation(s) within the areas targeted by this assay, and inadequate number of viral copies (<131 copies/mL). A negative result must be combined with clinical observations, patient history, and epidemiological information. The expected result is Negative.  Fact Sheet for Patients:  https://www.moore.com/  Fact Sheet for Healthcare Providers:  https://www.young.biz/  This test is no t yet approved or cleared by the [place] FDA and  has been authorized for detection and/or diagnosis of SARS-CoV-2 by FDA under an Emergency Use Authorization (EUA). This EUA will remain  in effect (meaning this test can be used) for the duration of the COVID-19 declaration under Section 564(b)(1) of the Act, 21 U.S.C. section 360bbb-3(b)(1), unless the authorization is terminated or revoked sooner.     Influenza A by PCR NEGATIVE NEGATIVE Final   Influenza B by PCR NEGATIVE NEGATIVE Final    Comment:  (NOTE) The Xpert Xpress SARS-CoV-2/FLU/RSV assay is intended as an aid in  the diagnosis of influenza from Nasopharyngeal swab specimens and  should not be used as a sole basis for treatment. Nasal washings and  aspirates are unacceptable for Xpert Xpress SARS-CoV-2/FLU/RSV  testing.  Fact Sheet for Patients: https://www.moore.com/  Fact Sheet for Healthcare Providers: https://www.young.biz/  This test is not yet approved or cleared by the [place] and  has been authorized for detection and/or diagnosis of SARS-CoV-2 by  FDA under an Emergency Use Authorization (EUA). This EUA will remain  in effect (meaning this test can be used) for the duration of the  Covid-19 declaration under Section 564(b)(1) of the Act, 21  U.S.C. section 360bbb-3(b)(1), unless the authorization is  terminated or revoked. Performed at [facility], [address] [place], [place] [address]   MRSA PCR Screening     Status: None   Collection Time: 11/22/19 12:40 AM   Specimen: Nasal Mucosa; Nasopharyngeal  Result Value Ref Range Status   MRSA by PCR NEGATIVE NEGATIVE Final    Comment:        The GeneXpert MRSA Assay (FDA approved for NASAL specimens only), is one component of a comprehensive MRSA colonization surveillance program. It is not intended to diagnose MRSA infection nor to guide or monitor treatment for MRSA infections. Performed at [facility], [address] [place], [place] [address]   Urine Culture     Status: Abnormal   Collection Time: 11/22/19  1:50 AM   Specimen: Urine, Random  Result Value Ref Range Status   Specimen Description   Final    URINE, RANDOM Performed at [facility], [address], [place], [place] [address]    Special Requests   Final    NONE Performed at [facility], [address] [place], [place] [address]    Culture MULTIPLE SPECIES PRESENT, SUGGEST RECOLLECTION  (A)  Final   Report Status 11/23/2019 FINAL  Final  Culture, blood (routine x 2)     Status: None (Preliminary result)   Collection Time: 11/27/19  1:48 PM   Specimen: BLOOD  Result Value Ref Range Status   Specimen Description BLOOD RIGHT ANTECUBITAL  Final   Special Requests   Final    BOTTLES DRAWN AEROBIC AND ANAEROBIC Blood Culture results may not be optimal due to an inadequate volume of blood received in culture bottles   Culture   Final    NO GROWTH 3 DAYS Performed at [facility], [address], [place], [place] [address]    Report Status PENDING  Incomplete  Culture, blood (routine x 2)     Status: None (Preliminary result)   Collection Time: 11/27/19  1:54 PM   Specimen: BLOOD  Result Value Ref Range Status   Specimen Description BLOOD BLOOD RIGHT HAND  Final   Special Requests   Final    BOTTLES DRAWN AEROBIC AND ANAEROBIC Blood Culture adequate volume   Culture   Final    NO GROWTH 3 DAYS Performed at [facility], [address], [place], [place] [address]    Report Status PENDING  Incomplete         Radiology Studies: DG Basil Dess Tube Plc W/Fl W/Rad  Result Date: 11/30/2019 CLINICAL DATA:  NG tube placement. EXAM: NASO G TUBE PLACEMENT WITH FL AND WITH RAD CONTRAST:  5 cc Omnipaque 300 FLUOROSCOPY TIME:  Fluoroscopy Time:  1 minutes and 24 seconds. Number of Acquired Spot Images: 2 COMPARISON:  11-29-19 CT abdomen pelvis. FINDINGS: An 64 French Dobbhoff tube was placed into the left nasal cavity and advanced into the proximal duodenum. High-resolution spot image demonstrates the tube overlying the proximal duodenum. 5 cc of Omnipaque 300 was injected into the tube to confirm positioning within the proximal small bowel. The patient tolerated the procedure  without complication. IMPRESSION: Technically successful placement of 8 Fr Dobbhoff tube terminating within the proximal duodenum. Electronically Signed   By: Stana Bunting M.D.   On:  11/30/2019 17:07        Scheduled Meds: . Chlorhexidine Gluconate Cloth  6 each Topical Daily  . feeding supplement (OSMOLITE 1.5 CAL)  1,000 mL Per Tube Q24H  . feeding supplement (PROSource TF)  45 mL Per Tube TID  . folic acid  1 mg Oral Daily  . heparin  5,000 Units Subcutaneous Q8H  . lactulose  30 g Oral BID  . mouth rinse  15 mL Mouth Rinse BID  . megestrol  400 mg Oral Daily  . methylPREDNISolone (SOLU-MEDROL) injection  40 mg Intravenous Daily  . multivitamin-lutein  1 capsule Oral Daily  . oxcarbazepine  600 mg Oral BID  . pantoprazole (PROTONIX) IV  40 mg Intravenous Q24H  . potassium chloride  40 mEq Oral Once  . rifaximin  550 mg Oral BID  . sertraline  50 mg Oral Daily  . sodium chloride flush  10-40 mL Intracatheter Q12H  . spironolactone  25 mg Oral Daily  . thiamine  100 mg Oral Daily   Or  . thiamine  100 mg Intravenous Daily   Continuous Infusions: . dextrose 5 % and 0.9 % NaCl with KCl 20 mEq/L 75 mL/hr at 11/30/19 1254     LOS: 9 days    Darlin Priestly, MD [facility]   11/30/2019, 6:28 PM

## 2019-11-30 NOTE — Progress Notes (Signed)
Physical Therapy Treatment Patient Details Name: Ashlee Hawkins MRN: [id number] DOB: Mar 01, 1980 Today's Date: 11/30/2019    History of Present Illness Pt is a 39 y.o. female presenting to hospital 10/18 with seizure that morning and LUQ abdominal pain; multiple falls past 2 weeks.  Pt admitted with severe hyponatremia with reported seizure, alcoholic hepatitis, and sepsis.  PMH includes Bipolar disorder, htn, alcoholic abuse with alcoholic hepatitis, PTSD, seizures.    PT Comments    Pt received supine in bed, easily aroused yet unable to appropriately verbalize, pt mumbling in attempt to communicate. Concerns relayed to nursing who stated pt fluctuates between clear speech and mumbling/grunting.  Pt's vitals assessed BP 133/96, HR 84bpm, 95% on RA. Pt positioned in upright posture, attempted ROM to B LE's, pt unable to tolerate. Will discuss concerns with primary PT.  Pt left supine with bed low, rails up, and call bell in reach.  Pt currently not medically able to tolerate mobility.  Follow Up Recommendations        Equipment Recommendations       Recommendations for Other Services       Precautions / Restrictions Precautions Precautions: Fall Precaution Comments: Seizure precautions; L midline, rectal tube Restrictions Weight Bearing Restrictions: No    Mobility  Bed Mobility                  Transfers                    Ambulation/Gait                 Stairs             Wheelchair Mobility    Modified Rankin (Stroke Patients Only)       Balance                                            Cognition Arousal/Alertness: Lethargic Behavior During Therapy: Flat affect Overall Cognitive Status: No family/caregiver present to determine baseline cognitive functioning                                 General Comments: Unable to understand pt, nursing notified      Exercises      General Comments         Pertinent Vitals/Pain Pain Assessment:  (Unable to verbaly quantify, no grimacing noted)    Home Living                      Prior Function            PT Goals (current goals can now be found in the care plan section) Acute Rehab PT Goals Patient Stated Goal: pt unable to verbalize    Frequency           PT Plan      Co-evaluation PT/OT/SLP Co-Evaluation/Treatment: Yes   PT goals addressed during session: Mobility/safety with mobility        AM-PAC PT "6 Clicks" Mobility   Outcome Measure  Help needed turning from your back to your side while in a flat bed without using bedrails?: A Lot Help needed moving from lying on your back to sitting on the side of a flat bed without using bedrails?: A Lot Help needed moving to and  from a bed to a chair (including a wheelchair)?: A Lot Help needed standing up from a chair using your arms (e.g., wheelchair or bedside chair)?: A Lot Help needed to walk in hospital room?: Total Help needed climbing 3-5 steps with a railing? : Total 6 Click Score: 10    End of Session               Time: 6195-0932 PT Time Calculation (min) (ACUTE ONLY): 9 min  Charges:  $Therapeutic Activity: 8-22 mins                    Zadie Cleverly, PTA   Jannet Askew 11/30/2019, 12:42 PM

## 2019-11-30 NOTE — Consult Note (Signed)
PHARMACY CONSULT NOTE - FOLLOW UP  Pharmacy Consult for Electrolyte Monitoring and Replacement   Recent Labs: Potassium (mmol/L)  Date Value  11/30/2019 3.4 (L)   Magnesium (mg/dL)  Date Value  42/68/3419 2.3   Calcium (mg/dL)  Date Value  62/22/9798 7.9 (L)   Albumin (g/dL)  Date Value  92/12/9415 2.1 (L)   Phosphorus (mg/dL)  Date Value  40/81/4481 UNABLE TO REPORT DUE TO ICTERUS   Sodium (mmol/L)  Date Value  11/30/2019 144     Assessment: 39yo female with PMH bipolar disorder, HTN, alcohol abuse with alcoholic hepatitis who came to ED c/o seizure occurring ~0900 10/18. Patient reports drinking her usual amount including 5 shots. Patient speech noted to be slow and deliberate. Skin and eyes noted to have yellow tone. Pharmacy has been consulted for monitoring electrolytes.   On D5 NS w/ KCl 20 mEq @75  mL/hr, aldactone 25mg .    Goal of Therapy:  WNL  Plan:  Will give an additional KCl (packets) Recheck electrolytes with morning labs.  , PharmD, BCPS Clinical Pharmacist 11/30/2019 8:42 AM

## 2019-11-30 NOTE — Progress Notes (Signed)
New order per Dr Fran Lowes to place dobhoff

## 2019-11-30 NOTE — Progress Notes (Signed)
Palliative:  HPI: 39 y.o.femalewith past medical history of ETOH abuse, seizures, and alcoholic hepatitisadmitted on 10/18/2021with LUQ pain and seizure at home.CT of abdomen revealed enlarged fatty liver. Found to have severe hyponatremia, hepatic encephalopathy, liver ascites, and ongoing severely elevated ammonia and bilirubin levels. PMT consulted to discuss [place].   I met again today at [facility] bedside. She is less responsive today but also this fluctuates. Ammonia level is down. Discussed with SLP and Dr. Billie Ruddy. She has been made NPO currently due to aspiration risk.   I called and discussed with husband, Ashlee Hawkins. I explained my concern with worsening swallow function and that this is very limiting for her. I worry that she has not had adequate nutrition in a long time now. I further discussed with Ashlee Hawkins if we should consider artificial feeding as this is uncomfortable, she is likely to pull this out, and I fear this is unlikely to change poor prognosis. John understands and he also feels she is likely to pull this out but he would like to continue aggressive care at this time. We did discuss my concern that she may not improve to leave the hospital. We discussed that if she shows no improvement over the next few days we may need to reconsider goals and comfort care and John understands.   I also spoke with Ashlee Hawkins about referral to [facility] for their 39 yo and Ashlee Hawkins agrees that this would be a good idea. I told Ashlee Hawkins that he will receive a call to discuss the program further. Also discussed this is through hospice agency but not part of hospice program. I spoke with hospice liaison regarding referral.   All questions/concerns addressed. Emotional support provided. Discussed with Dr. Billie Ruddy.   Exam: Sleeping but restless and agitated. Confused. Jaundice. No distress. Breathing regular, unlabored. Abd soft, distended.   Plan: - DNR already in place.  - Family request continued aggressive care outside  DNR for now.  - Ongoing goals of care conversations.   [place], NP Palliative Medicine Team Pager [phone] (Please see [email or web address] for schedule) Team Phone [phone]    Greater than 50%  of this time was spent counseling and coordinating care related to the above assessment and plan

## 2019-12-01 DIAGNOSIS — K7011 Alcoholic hepatitis with ascites: Secondary | ICD-10-CM | POA: Diagnosis not present

## 2019-12-01 DIAGNOSIS — Z515 Encounter for palliative care: Secondary | ICD-10-CM | POA: Diagnosis not present

## 2019-12-01 DIAGNOSIS — Z7189 Other specified counseling: Secondary | ICD-10-CM | POA: Diagnosis not present

## 2019-12-01 DIAGNOSIS — K7031 Alcoholic cirrhosis of liver with ascites: Secondary | ICD-10-CM | POA: Diagnosis not present

## 2019-12-01 DIAGNOSIS — E440 Moderate protein-calorie malnutrition: Secondary | ICD-10-CM | POA: Diagnosis not present

## 2019-12-01 LAB — CBC
HCT: 26 % — ABNORMAL LOW (ref 36.0–46.0)
Hemoglobin: 8.1 g/dL — ABNORMAL LOW (ref 12.0–15.0)
MCH: 37.3 pg — ABNORMAL HIGH (ref 26.0–34.0)
MCHC: 31.2 g/dL (ref 30.0–36.0)
MCV: 119.8 fL — ABNORMAL HIGH (ref 80.0–100.0)
Platelets: 90 10*3/uL — ABNORMAL LOW (ref 150–400)
RBC: 2.17 MIL/uL — ABNORMAL LOW (ref 3.87–5.11)
RDW: 23.9 % — ABNORMAL HIGH (ref 11.5–15.5)
WBC: 11 10*3/uL — ABNORMAL HIGH (ref 4.0–10.5)
nRBC: 1.3 % — ABNORMAL HIGH (ref 0.0–0.2)

## 2019-12-01 LAB — GLUCOSE, CAPILLARY
Glucose-Capillary: 116 mg/dL — ABNORMAL HIGH (ref 70–99)
Glucose-Capillary: 124 mg/dL — ABNORMAL HIGH (ref 70–99)
Glucose-Capillary: 124 mg/dL — ABNORMAL HIGH (ref 70–99)
Glucose-Capillary: 130 mg/dL — ABNORMAL HIGH (ref 70–99)
Glucose-Capillary: 133 mg/dL — ABNORMAL HIGH (ref 70–99)
Glucose-Capillary: 145 mg/dL — ABNORMAL HIGH (ref 70–99)

## 2019-12-01 LAB — BASIC METABOLIC PANEL
Anion gap: 9 (ref 5–15)
BUN: 10 mg/dL (ref 6–20)
CO2: 22 mmol/L (ref 22–32)
Calcium: 7.9 mg/dL — ABNORMAL LOW (ref 8.9–10.3)
Chloride: 116 mmol/L — ABNORMAL HIGH (ref 98–111)
Creatinine, Ser: <0.3 mg/dL — ABNORMAL LOW (ref 0.44–1.00)
Glucose, Bld: 129 mg/dL — ABNORMAL HIGH (ref 70–99)
Potassium: 3.2 mmol/L — ABNORMAL LOW (ref 3.5–5.1)
Sodium: 147 mmol/L — ABNORMAL HIGH (ref 135–145)

## 2019-12-01 LAB — PHOSPHORUS: Phosphorus: UNDETERMINED mg/dL (ref 2.5–4.6)

## 2019-12-01 LAB — MAGNESIUM: Magnesium: 2.3 mg/dL (ref 1.7–2.4)

## 2019-12-01 MED ORDER — ALBUMIN HUMAN 5 % IV SOLN
25.0000 g | Freq: Every day | INTRAVENOUS | Status: AC
  Administered 2019-12-01 – 2019-12-03 (×3): 25 g via INTRAVENOUS
  Filled 2019-12-01 (×5): qty 500

## 2019-12-01 MED ORDER — POTASSIUM & SODIUM PHOSPHATES 280-160-250 MG PO PACK
1.0000 | PACK | Freq: Every day | ORAL | Status: DC
  Administered 2019-12-01: 1
  Filled 2019-12-01 (×2): qty 1

## 2019-12-01 MED ORDER — POTASSIUM CHLORIDE 20 MEQ PO PACK
40.0000 meq | PACK | Freq: Once | ORAL | Status: AC
  Administered 2019-12-02: 40 meq
  Filled 2019-12-01: qty 2

## 2019-12-01 MED ORDER — LACTULOSE 10 GM/15ML PO SOLN
20.0000 g | Freq: Two times a day (BID) | ORAL | Status: DC
  Administered 2019-12-01 – 2019-12-04 (×7): 20 g
  Filled 2019-12-01 (×7): qty 30

## 2019-12-01 MED ORDER — KCL IN DEXTROSE-NACL 40-5-0.45 MEQ/L-%-% IV SOLN
INTRAVENOUS | Status: DC
  Filled 2019-12-01 (×2): qty 1000

## 2019-12-01 MED ORDER — POTASSIUM CHLORIDE 20 MEQ PO PACK
40.0000 meq | PACK | Freq: Once | ORAL | Status: AC
  Administered 2019-12-01: 10:00:00 40 meq

## 2019-12-01 MED ORDER — LORAZEPAM 2 MG/ML IJ SOLN
1.0000 mg | Freq: Four times a day (QID) | INTRAMUSCULAR | Status: DC | PRN
  Administered 2019-12-01 – 2019-12-03 (×7): 1 mg via INTRAVENOUS
  Filled 2019-12-01 (×7): qty 1

## 2019-12-01 MED ORDER — FREE WATER
120.0000 mL | Status: DC
  Administered 2019-12-01 – 2019-12-02 (×6): 120 mL

## 2019-12-01 MED ORDER — OSMOLITE 1.5 CAL PO LIQD
1000.0000 mL | ORAL | Status: DC
  Administered 2019-12-01: 19:00:00 1000 mL

## 2019-12-01 MED ORDER — POTASSIUM & SODIUM PHOSPHATES 280-160-250 MG PO PACK
1.0000 | PACK | Freq: Every day | ORAL | Status: DC
  Filled 2019-12-01: qty 1

## 2019-12-01 MED ORDER — POTASSIUM CHLORIDE 20 MEQ PO PACK: 40.0000 meq | PACK | Freq: Once | ORAL | Status: DC

## 2019-12-01 MED ORDER — PANTOPRAZOLE SODIUM 40 MG IV SOLR
40.0000 mg | INTRAVENOUS | Status: DC
  Administered 2019-12-02: 21:00:00 40 mg via INTRAVENOUS
  Filled 2019-12-01: qty 40

## 2019-12-01 NOTE — Progress Notes (Signed)
Palliative:  HPI:39 y.o.femalewith past medical history of ETOH abuse, seizures, and alcoholic hepatitisadmitted on 10/18/2021with LUQ pain and seizure at home.CT of abdomen revealed enlarged fatty liver. Found to have severe hyponatremia, hepatic encephalopathy, liver ascites, and ongoing severely elevated ammonia and bilirubin levels.PMT consulted to discuss [place].   I met today again at [facility] bedside. She is restless and RN at bedside. She has feeding tube in nose and receiving nutrition. She continues to be confused and difficult to understand her speech much of the time.   I called and updated husband, Ashlee Hawkins. Ashlee Hawkins has been working to arrange [employer] support for their 39 yo today. I provided above assessment. We discussed that the labs do show that renal function is stable BUT she continues with liver failure that is not improving. Although she is not necessarily worse she is not any better either. Ashlee Hawkins states that he is hoping that the tube feeding will help. I expressed to Ashlee Hawkins that I continue to be very concerned that Ashlee Hawkins will not improve past her current state. Ashlee Hawkins understands. He is not prepared to consider comfort measures at this time. He endorses appreciation of the update and the care Ashlee Hawkins has been receiving. We will speak again tomorrow.   All questions/concerns addressed. Emotional support provided.   Exam: Lethargic but restless/agitated. Jaundice. Breathing regular, unlabored. Abd distended but soft. Cortrak in place with feeding. Mittens on bilateral hands.   Plan: - Continue current interventions. - Will revisit goals of care tomorrow. Plan to discuss limitations if she worsens and potential timeframe to continue current interventions vs consideration of comfort care.   25 min  Vinie Sill, NP Palliative Medicine Team Pager [phone] (Please see [email or web address] for schedule) Team Phone [phone]    Greater than 50%  of this time was spent counseling  and coordinating care related to the above assessment and plan

## 2019-12-01 NOTE — Progress Notes (Signed)
OT Cancellation Note  Patient Details Name: Ashlee Hawkins MRN: [id number] DOB: Jun 01, 1980   Cancelled Treatment:    Reason Eval/Treat Not Completed: Other (comment). OT attempt made. Pt receiving nursing care at this time. OT will follow up when pt is available.   Jackquline Denmark, MS, OTR/L , CBIS ascom [phone]  12/01/19, 9:21 AM  12/01/2019, 9:20 AM

## 2019-12-01 NOTE — Progress Notes (Signed)
PT Cancellation Note  Patient Details Name: JERUSALEN MATEJA MRN: [id number] DOB: 03-Oct-1980   Cancelled Treatment:   Pt had feeding tube placed yesterday afternoon.  Pt does not appear able to tolerate skilled PT today due to difficulty arousing pt.      Jannet Askew 12/01/2019, 11:03 AM

## 2019-12-01 NOTE — Progress Notes (Signed)
PROGRESS NOTE    Ashlee Hawkins  LOV:[id number] DOB: Aug 05, 1980 DOA: 2019/12/15 [facility]CP: [facility]   Chief complaint.  Altered mental status. Brief Narrative:  Patient is a 39 year old female with a history of alcohol abuse, seizure disorder and alcohol hepatitis who initially admitedto ICU on 10/18 with a severe hyponatremia with sodium 108, ammonia 106.She has been continuously drinking alcohol, last drink was before admission. She was also having seizures. Since admission to the ICU, sodium level has went up after giving 3% sodium chloride, she was  also giving lactulose.She is very confused, mental status has not improved since admission. Her total bilirubin was 23.3. Ammonia level still elevated. Patient has been seen by palliative care, long-term prognosis is very poor.  10/23.Increase the lactulose dose, startedrehydration. 10/24.Patient mental status seem to be improving today. Still has very high bilirubin level, consult GI. 10/25.  Patient does not have portal vein thrombosis on MRV study.  Start IV steroids per GI.   Assessment & Plan:   Active Problems:   Hyponatremia with decreased serum osmolality   Alcoholic cirrhosis of liver with ascites (HCC)   Hepatic encephalopathy (HCC)   Goals of care, counseling/discussion   Palliative care by specialist   DNR (do not resuscitate)   Seizure (HCC)   Malnutrition of moderate degree  #1.  Hepatic encephalopathy. Patient mental status gradually getting better.   --reduce lactulose to 20 mg BID (down from 30 BID) due to liquid stool --cont rifaxim --cont IV solumedrol, per GI  #2.  Alcohol liver cirrhosis with ascites. Ultrasound did not show large enough ascites to tap, MRI ruled out portal vein thrombosis. Long-term prognosis guarded.  Patient has extreme elevation of bilirubin. --Please note that patient creatinine level and phosphorus level were not able to be measured due to severe elevation  of bilirubin. PLAN: --cont aldactone and steroid  # dysphagia #3.  Anorexia with a severe protein calorie malnutrition. --currently pt can not swallow safely.  Husband wants to initiate tube feed for now. --Dobhoff tube placed by IR 10/27 PLAN: --cont tube feed with slow advance --Need to monitor for refeeding syndrome --empiric daily phos supplement --d/c MIVF  Hypernatremia --free water added today, per nutrition  4.  Aspiration pneumonia. Completed antibiotics x 5days.  5.  Severe hyponatremia, resolved  6.  Anemia of chronic disease. Continue to follow.  7.  Thrombocytopenia. Secondary to liver cirrhosis.  Stable.  Hypokalemia --replete PRN   DVT prophylaxis: Heparin Code Status: DNR Family Communication: husband updated by palliative care provider   Status is: Inpatient   Dispo: The patient is from: Home              Anticipated d/c is to: Home              Anticipated d/c date is: > 3 days              Patient currently is not medically stable to d/c. Can not safely swallow.  Husband asked to initiate tube feed.  Not ready for comfort care yet.     I/O last 3 completed shifts: In: 900 [I.V.:900] Out: 1100 [Urine:700; Stool:400] Total I/O In: -  Out: 600 [Stool:600]     Consultants:   gi  Procedures: none  Antimicrobials: none  Subjective: Started tube feeding last night. Pt was more alert today, but also more agitated, crying out and trying to get out of bed.   Objective: Vitals:   12/01/19 0510 12/01/19 0759 12/01/19 1229  12/01/19 1614  BP: 110/74 115/72 118/74 112/68  Pulse: 91 91 91 94  Resp: Temp: 98.2 F (36.8 C) 97.8 F (36.6 C)  97.9 F (36.6 C)  TempSrc:  Oral    SpO2: 94% 92% 92% 90%  Weight:      Height:        Intake/Output Summary (Last 24 hours) at 12/01/2019 1740 Last data filed at 12/01/2019 1300 Gross per 24 hour  Intake 900 ml  Output 1050 ml  Net -150 ml   Filed Weights   11/24/19 0351  11/26/19 0408 12/01/19 0500  Weight: 91.1 kg 90.6 kg 91.1 kg    Examination:  Constitutional: NAD, alert, not coherent, very jaundiced.   HEENT: conjunctivae icterus, and lids normal, EOMI CV: No cyanosis.   RESP: normal respiratory effort, on RA GI:  Abdomen soft.  Rectal tube outputting orange liquid stool Extremities: No effusions, edema in BLE SKIN: warm, dry and intact Neuro: II - XII grossly intact.     Data Reviewed: I have personally reviewed following labs and imaging studies  CBC: Recent Labs  Lab 11/25/19 0030 11/26/19 0700 11/27/19 0413 11/29/19 0714 12/01/19 0427  WBC 9.9 12.1* 12.2* 9.0 11.0*  NEUTROABS 6.5 7.9* 8.0* 6.1  --   HGB 8.6* 8.4* 8.3* 9.0* 8.1*  HCT 25.6* 25.2* 25.5* 29.7* 26.0*  MCV 110.8* 110.5* 114.3* 119.8* 119.8*  PLT 188 148* 129* 92* 90*   Basic Metabolic Panel: Recent Labs  Lab 11/27/19 0413 11/28/19 0456 11/29/19 0714 11/30/19 0651 12/01/19 0427  NA 136 136 139 144 147*  K 3.3* 3.4* 3.8 3.4* 3.2*  CL 100 104 108 113* 116*  CO2 26 22 21* 20* 22  GLUCOSE 105* 95 98 117* 129*  BUN <5* <5* CREATININE UNABLE TO REPORT DUE TO ICTERUS  UNABLE TO REPORT DUE TO ICTERIC INTERFERENCE SDR  UNABLE TO REPORT DUE TO ICTERIC INTERFERENCE SDR  UNABLE TO REPORT DUE TO ICTERUS <0.30*  CALCIUM 7.9* 8.1* 7.8* 7.9* 7.9*  MG 2.1 2.1 2.2 2.3 2.3  PHOS UNABLE TO REPORT DUE TO ICTERUS UNABLE TO REPORT DUE TO ICTERIC INTERFERENCE SDR  UNABLE TO REPORT DUE TO ICTERIC INTERFERENCE SDR UNABLE TO REPORT DUE TO ICTERUS UNABLE TO RREPORT DUE TO ICTERUS SDR   GFR: CrCl cannot be calculated (This lab value cannot be used to calculate CrCl because it is not a number: <0.30). Liver Function Tests: Recent Labs  Lab 11/26/19 0700 11/27/19 0413 11/28/19 0456 11/29/19 0714 11/30/19 0651  AST 232* 204* 180* 159* 135*  ALT 62* 58* 55* 52* 48*  ALKPHOS 426* 449* 433* 385* 343*  BILITOT 23.3* 24.8* 26.8* 25.5* 22.5*  PROT 5.5* 5.5* 5.7* 5.3* 5.2*    ALBUMIN 2.1* 2.1* 2.2* 2.1* 2.1*   No results for input(s): LIPASE, AMYLASE in the last 168 hours. Recent Labs  Lab 11/27/19 0413 11/28/19 0456 11/30/19 0651  AMMONIA 102* 103* 58*   Coagulation Profile: Recent Labs  Lab 11/27/19 1218  INR 1.8*   Cardiac Enzymes: No results for input(s): CKTOTAL, CKMB, CKMBINDEX, TROPONINI in the last 168 hours. BNP (last 3 results) No results for input(s): PROBNP in the last 8760 hours. HbA1C: No results for input(s): HGBA1C in the last 72 hours. CBG: Recent Labs  Lab 12/01/19 0002 12/01/19 0555 12/01/19 0800 12/01/19 1226 12/01/19 1612  GLUCAP 116* 124* 133* 145* 130*   Lipid Profile: No results for input(s): CHOL, HDL, LDLCALC, TRIG, CHOLHDL, LDLDIRECT in the  last 72 hours. Thyroid Function Tests: No results for input(s): TSH, T4TOTAL, FREET4, T3FREE, THYROIDAB in the last 72 hours. Anemia Panel: No results for input(s): VITAMINB12, FOLATE, FERRITIN, TIBC, IRON, RETICCTPCT in the last 72 hours. Sepsis Labs: Recent Labs  Lab 11/27/19 0413  PROCALCITON 0.75    Recent Results (from the past 240 hour(s))  Respiratory Panel by RT PCR (Flu A&B, Covid) - Nasopharyngeal Swab     Status: None   Collection Time: November 26, 2019  8:45 PM   Specimen: Nasopharyngeal Swab  Result Value Ref Range Status   SARS Coronavirus 2 by RT PCR NEGATIVE NEGATIVE Final    Comment: (NOTE) SARS-CoV-2 target nucleic acids are NOT DETECTED.  The SARS-CoV-2 RNA is generally detectable in upper respiratoy specimens during the acute phase of infection. The lowest concentration of SARS-CoV-2 viral copies this assay can detect is 131 copies/mL. A negative result does not preclude SARS-Cov-2 infection and should not be used as the sole basis for treatment or other patient management decisions. A negative result may occur with  improper specimen collection/handling, submission of specimen other than nasopharyngeal swab, presence of viral mutation(s) within  the areas targeted by this assay, and inadequate number of viral copies (<131 copies/mL). A negative result must be combined with clinical observations, patient history, and epidemiological information. The expected result is Negative.  Fact Sheet for Patients:  https://www.moore.com/  Fact Sheet for Healthcare Providers:  https://www.young.biz/  This test is no t yet approved or cleared by the [place] FDA and  has been authorized for detection and/or diagnosis of SARS-CoV-2 by FDA under an Emergency Use Authorization (EUA). This EUA will remain  in effect (meaning this test can be used) for the duration of the COVID-19 declaration under Section 564(b)(1) of the Act, 21 U.S.C. section 360bbb-3(b)(1), unless the authorization is terminated or revoked sooner.     Influenza A by PCR NEGATIVE NEGATIVE Final   Influenza B by PCR NEGATIVE NEGATIVE Final    Comment: (NOTE) The Xpert Xpress SARS-CoV-2/FLU/RSV assay is intended as an aid in  the diagnosis of influenza from Nasopharyngeal swab specimens and  should not be used as a sole basis for treatment. Nasal washings and  aspirates are unacceptable for Xpert Xpress SARS-CoV-2/FLU/RSV  testing.  Fact Sheet for Patients: https://www.moore.com/  Fact Sheet for Healthcare Providers: https://www.young.biz/  This test is not yet approved or cleared by the [place] FDA and  has been authorized for detection and/or diagnosis of SARS-CoV-2 by  FDA under an Emergency Use Authorization (EUA). This EUA will remain  in effect (meaning this test can be used) for the duration of the  Covid-19 declaration under Section 564(b)(1) of the Act, 21  U.S.C. section 360bbb-3(b)(1), unless the authorization is  terminated or revoked. Performed at [facility], [address] [place], [place] [address]   MRSA PCR Screening     Status: None   Collection  Time: 11/22/19 12:40 AM   Specimen: Nasal Mucosa; Nasopharyngeal  Result Value Ref Range Status   MRSA by PCR NEGATIVE NEGATIVE Final    Comment:        The GeneXpert MRSA Assay (FDA approved for NASAL specimens only), is one component of a comprehensive MRSA colonization surveillance program. It is not intended to diagnose MRSA infection nor to guide or monitor treatment for MRSA infections. Performed at [facility], [address], [place], [place] [address]   Urine Culture     Status: Abnormal   Collection Time: 11/22/19  1:50 AM   Specimen: Urine, Random  Result Value Ref Range Status   Specimen Description   Final    URINE, RANDOM Performed at [facility], [address] [place], [place] [address]    Special Requests   Final    NONE Performed at [facility], [address] [place], [place] [address]    Culture MULTIPLE SPECIES PRESENT, SUGGEST RECOLLECTION (A)  Final   Report Status 11/23/2019 FINAL  Final  Culture, blood (routine x 2)     Status: None (Preliminary result)   Collection Time: 11/27/19  1:48 PM   Specimen: BLOOD  Result Value Ref Range Status   Specimen Description BLOOD RIGHT ANTECUBITAL  Final   Special Requests   Final    BOTTLES DRAWN AEROBIC AND ANAEROBIC Blood Culture results may not be optimal due to an inadequate volume of blood received in culture bottles   Culture   Final    NO GROWTH 4 DAYS Performed at [facility], [address] [place], [place] [address]    Report Status PENDING  Incomplete  Culture, blood (routine x 2)     Status: None (Preliminary result)   Collection Time: 11/27/19  1:54 PM   Specimen: BLOOD  Result Value Ref Range Status   Specimen Description BLOOD BLOOD RIGHT HAND  Final   Special Requests   Final    BOTTLES DRAWN AEROBIC AND ANAEROBIC Blood Culture adequate volume   Culture   Final    NO GROWTH 4 DAYS Performed at [facility], [address]  [place], [place] [address]    Report Status PENDING  Incomplete         Radiology Studies: DG Basil Dessaso G Tube Plc W/Fl W/Rad  Result Date: 11/30/2019 CLINICAL DATA:  NG tube placement. EXAM: NASO G TUBE PLACEMENT WITH FL AND WITH RAD CONTRAST:  5 cc Omnipaque 300 FLUOROSCOPY TIME:  Fluoroscopy Time:  1 minutes and 24 seconds. Number of Acquired Spot Images: 2 COMPARISON:  2019-05-28 CT abdomen pelvis. FINDINGS: An 408 French Dobbhoff tube was placed into the left nasal cavity and advanced into the proximal duodenum. High-resolution spot image demonstrates the tube overlying the proximal duodenum. 5 cc of Omnipaque 300 was injected into the tube to confirm positioning within the proximal small bowel. The patient tolerated the procedure without complication. IMPRESSION: Technically successful placement of 8 Fr Dobbhoff tube terminating within the proximal duodenum. Electronically Signed   By: Stana Buntinghikanele  Emekauwa M.D.   On: 11/30/2019 17:07        Scheduled Meds:  Chlorhexidine Gluconate Cloth  6 each Topical Daily   feeding supplement (OSMOLITE 1.5 CAL)  1,000 mL Per Tube Q24H   feeding supplement (PROSource TF)  45 mL Per Tube TID   folic acid  1 mg Per Tube Daily   free water  120 mL Per Tube Q4H   heparin  5,000 Units Subcutaneous Q8H   lactulose  30 g Per Tube BID   mouth rinse  15 mL Mouth Rinse BID   methylPREDNISolone (SOLU-MEDROL) injection  40 mg Intravenous Daily   multivitamin-lutein  1 capsule Per Tube Daily   pantoprazole (PROTONIX) IV  40 mg Intravenous Q24H   potassium & sodium phosphates  1 packet Per Tube Q1200   [START ON 12/02/2019] potassium chloride  40 mEq Per Tube Once   rifaximin  550 mg Per Tube BID   sodium chloride flush  10-40 mL Intracatheter Q12H   spironolactone  25 mg  Per Tube Daily   thiamine  100 mg Per Tube Daily   Or   thiamine  100 mg Intravenous Daily   Continuous Infusions:  albumin human       LOS: 10 days    Darlin Priestly,  MD [facility]   12/01/2019, 5:40 PM

## 2019-12-01 NOTE — Consult Note (Addendum)
PHARMACY CONSULT NOTE - FOLLOW UP  Pharmacy Consult for Electrolyte Monitoring and Replacement   Recent Labs: Potassium (mmol/L)  Date Value  12/01/2019 3.2 (L)   Magnesium (mg/dL)  Date Value  07/37/1062 2.3   Calcium (mg/dL)  Date Value  69/48/5462 7.9 (L)   Albumin (g/dL)  Date Value  70/35/0093 2.1 (L)   Phosphorus (mg/dL)  Date Value  81/82/9937 UNABLE TO RREPORT DUE TO ICTERUS SDR   Sodium (mmol/L)  Date Value  12/01/2019 147 (H)     Assessment: 39yo female with PMH bipolar disorder, HTN, alcohol abuse with alcoholic hepatitis who came to ED c/o seizure occurring ~0900 10/18. Patient reports drinking her usual amount including 5 shots. Patient speech noted to be slow and deliberate. Skin and eyes noted to have yellow tone. Pharmacy has been consulted for monitoring electrolytes.   Currently on D5 NS w/ KCl 20 mEq @75  mL/hr, aldactone 25mg .    Goal of Therapy:  WNL  Plan:  Will change D5 1/2 NS w KCl 20 to @ same rate  Recheck electrolytes with morning labs.  Addendum - after discusion with hospitalist and dietician will also start daily phos supplement per tube despite inability to obtain phos level because of refeed risk now that tube is in place.  MIVF's dc'ed and KCl packets ordered for replenishment instead  , PharmD, BCPS Clinical Pharmacist 12/01/2019 7:55 AM

## 2019-12-02 DIAGNOSIS — K7031 Alcoholic cirrhosis of liver with ascites: Secondary | ICD-10-CM | POA: Diagnosis not present

## 2019-12-02 DIAGNOSIS — K7290 Hepatic failure, unspecified without coma: Secondary | ICD-10-CM

## 2019-12-02 DIAGNOSIS — K7460 Unspecified cirrhosis of liver: Secondary | ICD-10-CM

## 2019-12-02 DIAGNOSIS — K7011 Alcoholic hepatitis with ascites: Secondary | ICD-10-CM | POA: Diagnosis not present

## 2019-12-02 LAB — BASIC METABOLIC PANEL
Anion gap: 9 (ref 5–15)
BUN: 11 mg/dL (ref 6–20)
CO2: 23 mmol/L (ref 22–32)
Calcium: 8.2 mg/dL — ABNORMAL LOW (ref 8.9–10.3)
Chloride: 119 mmol/L — ABNORMAL HIGH (ref 98–111)
Creatinine, Ser: <0.3 mg/dL — ABNORMAL LOW (ref 0.44–1.00)
Glucose, Bld: 152 mg/dL — ABNORMAL HIGH (ref 70–99)
Potassium: 3.2 mmol/L — ABNORMAL LOW (ref 3.5–5.1)
Sodium: 151 mmol/L — ABNORMAL HIGH (ref 135–145)

## 2019-12-02 LAB — HEPATIC FUNCTION PANEL
ALT: 42 U/L (ref 0–44)
AST: 120 U/L — ABNORMAL HIGH (ref 15–41)
Albumin: 2.9 g/dL — ABNORMAL LOW (ref 3.5–5.0)
Alkaline Phosphatase: 352 U/L — ABNORMAL HIGH (ref 38–126)
Bilirubin, Direct: 11.3 mg/dL — ABNORMAL HIGH (ref 0.0–0.2)
Indirect Bilirubin: 8 mg/dL — ABNORMAL HIGH (ref 0.3–0.9)
Total Bilirubin: 19.3 mg/dL (ref 0.3–1.2)
Total Protein: 6.1 g/dL — ABNORMAL LOW (ref 6.5–8.1)

## 2019-12-02 LAB — CBC
HCT: 29.2 % — ABNORMAL LOW (ref 36.0–46.0)
Hemoglobin: 8.8 g/dL — ABNORMAL LOW (ref 12.0–15.0)
MCH: 37 pg — ABNORMAL HIGH (ref 26.0–34.0)
MCHC: 30.1 g/dL (ref 30.0–36.0)
MCV: 122.7 fL — ABNORMAL HIGH (ref 80.0–100.0)
Platelets: 93 10*3/uL — ABNORMAL LOW (ref 150–400)
RBC: 2.38 MIL/uL — ABNORMAL LOW (ref 3.87–5.11)
RDW: 24.4 % — ABNORMAL HIGH (ref 11.5–15.5)
WBC: 14.1 10*3/uL — ABNORMAL HIGH (ref 4.0–10.5)
nRBC: 1.2 % — ABNORMAL HIGH (ref 0.0–0.2)

## 2019-12-02 LAB — GLUCOSE, CAPILLARY
Glucose-Capillary: 116 mg/dL — ABNORMAL HIGH (ref 70–99)
Glucose-Capillary: 118 mg/dL — ABNORMAL HIGH (ref 70–99)
Glucose-Capillary: 119 mg/dL — ABNORMAL HIGH (ref 70–99)
Glucose-Capillary: 124 mg/dL — ABNORMAL HIGH (ref 70–99)
Glucose-Capillary: 133 mg/dL — ABNORMAL HIGH (ref 70–99)

## 2019-12-02 LAB — MAGNESIUM: Magnesium: 2.4 mg/dL (ref 1.7–2.4)

## 2019-12-02 LAB — CULTURE, BLOOD (ROUTINE X 2)
Culture: NO GROWTH
Culture: NO GROWTH
Special Requests: ADEQUATE

## 2019-12-02 LAB — PHOSPHORUS: Phosphorus: UNDETERMINED mg/dL (ref 2.5–4.6)

## 2019-12-02 LAB — PROTIME-INR
INR: 1.5 — ABNORMAL HIGH (ref 0.8–1.2)
Prothrombin Time: 17.6 seconds — ABNORMAL HIGH (ref 11.4–15.2)

## 2019-12-02 MED ORDER — HALOPERIDOL LACTATE 5 MG/ML IJ SOLN
2.0000 mg | Freq: Four times a day (QID) | INTRAMUSCULAR | Status: DC | PRN
  Administered 2019-12-02 – 2019-12-05 (×4): 2 mg via INTRAVENOUS
  Filled 2019-12-02 (×4): qty 1

## 2019-12-02 MED ORDER — OSMOLITE 1.5 CAL PO LIQD: 1000.0000 mL | ORAL | Status: DC

## 2019-12-02 MED ORDER — OSMOLITE 1.5 CAL PO LIQD
1000.0000 mL | ORAL | Status: DC
  Administered 2019-12-02 – 2019-12-04 (×3): 1000 mL

## 2019-12-02 MED ORDER — FREE WATER
300.0000 mL | Status: DC
  Administered 2019-12-02 – 2019-12-03 (×6): 300 mL

## 2019-12-02 MED ORDER — POTASSIUM & SODIUM PHOSPHATES 280-160-250 MG PO PACK
1.0000 | PACK | Freq: Two times a day (BID) | ORAL | Status: DC
  Administered 2019-12-02 – 2019-12-04 (×5): 1
  Filled 2019-12-02 (×8): qty 1

## 2019-12-02 MED ORDER — ZINC OXIDE 12.8 % EX OINT
TOPICAL_OINTMENT | CUTANEOUS | Status: DC | PRN
  Filled 2019-12-02: qty 56.7

## 2019-12-02 NOTE — Progress Notes (Signed)
Pt's mother in law and father in law at bedside.  They provided password for updates on patient.  Visitors stated patient's husband, their son, is also not well and struggling with addiction.  They stated they planned to speak with patient's spouse, Jonny Ruiz, to attempt to explain exact condition of patient as they witnessed.

## 2019-12-02 NOTE — Consult Note (Addendum)
PHARMACY CONSULT NOTE - FOLLOW UP  Pharmacy Consult for Electrolyte Monitoring and Replacement   Recent Labs: Potassium (mmol/L)  Date Value  12/02/2019 3.2 (L)   Magnesium (mg/dL)  Date Value  25/36/6440 2.4   Calcium (mg/dL)  Date Value  34/74/2595 8.2 (L)   Albumin (g/dL)  Date Value  63/87/5643 2.1 (L)   Phosphorus (mg/dL)  Date Value  32/95/1884 UNABLE TO REPORT DUE TO ICTERUS   Sodium (mmol/L)  Date Value  12/02/2019 151 (H)     Assessment: 39yo female with PMH bipolar disorder, HTN, alcohol abuse with alcoholic hepatitis who came to ED c/o seizure occurring ~0900 10/18. Patient reports drinking her usual amount including 5 shots. Patient speech noted to be slow and deliberate. Skin and eyes noted to have yellow tone. Pharmacy has been consulted for monitoring electrolytes.   Currently on  aldactone 25mg .    Goal of Therapy:  WNL  Plan:  Will conitnue daily phos supplement per tube despite inability to obtain phos level because of refeed risk now that tube is in place.  Will increase to 2 doses today as tube feeds increase, but will have to monitor Na levels carefully (151 today).    KCl packets ordered again for potassium replenishment.   Recheck electrolytes with morning labs.   , PharmD, BCPS Clinical Pharmacist 12/02/2019 7:50 AM

## 2019-12-02 NOTE — Progress Notes (Signed)
CRITICAL VALUE ALERT  Critical Value:  Bilirubin 19.3  Date & Time Notied:  12/02/19  1500  Provider Notified: Dr Darlin Priestly  Orders Received/Actions taken: awaiting response

## 2019-12-02 NOTE — Progress Notes (Signed)
Ashlee Repressohini R Tashena Ibach, MD [address]  [address]  [place], [place] [address]  Main: [phone]  Fax: [phone] Pager: [phone]   Subjective: Patient remains confused and altered.  Not following commands.  She has a Dobbhoff and started on tube feeds  Objective: Vital signs in last 24 hours: Vitals:   12/02/19 0500 12/02/19 0546 12/02/19 0813 12/02/19 1205  BP:  (!) 155/92 (!) 123/96 (!) 160/94  Pulse:  99 96 94  Resp:  20 16 16   Temp:  97.8 F (36.6 C) 97.6 F (36.4 C) 97.7 F (36.5 C)  TempSrc:   Oral Oral  SpO2:  100% (!) 82% (!) 84%  Weight: 103.4 kg     Height:       Weight change: 12.3 kg  Intake/Output Summary (Last 24 hours) at 12/02/2019 1619 Last data filed at 12/02/2019 1500 Gross per 24 hour  Intake 2441.16 ml  Output 800 ml  Net 1641.16 ml     Exam: Heart:: Regular rate and rhythm or S1S2 present Lungs: normal and clear to auscultation Abdomen: soft, nontender, normal bowel sounds   Lab Results: CBC Latest Ref Rng & Units 12/02/2019 12/01/2019 11/29/2019  WBC 4.0 - 10.5 K/uL 14.1(H) 11.0(H) 9.0  Hemoglobin 12.0 - 15.0 g/dL 3.2(G8.8(L) 8.1(L) 9.0(L)  Hematocrit 36 - 46 % 29.2(L) 26.0(L) 29.7(L)  Platelets 150 - 400 K/uL 93(L) 90(L) 92(L)   CMP Latest Ref Rng & Units 12/02/2019 12/01/2019 11/30/2019  Glucose 70 - 99 mg/dL 401(U152(H) 272(Z129(H) 366(Y117(H)  BUN 6 - 20 mg/dL 11 10 8   Creatinine 0.44 - 1.00 mg/dL <4.03(K<0.30(L) <7.42(V<0.30(L) UNABLE TO REPORT DUE TO ICTERUS  Sodium 135 - 145 mmol/L 151(H) 147(H) 144  Potassium 3.5 - 5.1 mmol/L 3.2(L) 3.2(L) 3.4(L)  Chloride 98 - 111 mmol/L 119(H) 116(H) 113(H)  CO2 22 - 32 mmol/L 23 22 20(L)  Calcium 8.9 - 10.3 mg/dL 8.2(L) 7.9(L) 7.9(L)  Total Protein 6.5 - 8.1 g/dL 6.1(L) - 5.2(L)  Total Bilirubin 0.3 - 1.2 mg/dL 19.3(HH) - 22.5(HH)  Alkaline Phos 38 - 126 U/L 352(H) - 343(H)  AST 15 - 41 U/L 120(H) - 135(H)  ALT 0 - 44 U/L 42 - 48(H)    Micro Results: Recent Results (from the past 240 hour(s))  Culture,  blood (routine x 2)     Status: None   Collection Time: 11/27/19  1:48 PM   Specimen: BLOOD  Result Value Ref Range Status   Specimen Description BLOOD RIGHT ANTECUBITAL  Final   Special Requests   Final    BOTTLES DRAWN AEROBIC AND ANAEROBIC Blood Culture results may not be optimal due to an inadequate volume of blood received in culture bottles   Culture   Final    NO GROWTH 5 DAYS Performed at [facility], [address] [place], [place] [address]    Report Status 12/02/2019 FINAL  Final  Culture, blood (routine x 2)     Status: None   Collection Time: 11/27/19  1:54 PM   Specimen: BLOOD  Result Value Ref Range Status   Specimen Description BLOOD BLOOD RIGHT HAND  Final   Special Requests   Final    BOTTLES DRAWN AEROBIC AND ANAEROBIC Blood Culture adequate volume   Culture   Final    NO GROWTH 5 DAYS Performed at [facility], [address] [place], [place] [address]    Report Status 12/02/2019 FINAL  Final   Studies/Results: DG Naso G Tube Plc W/Fl W/Rad  Result  Date: 11/30/2019 CLINICAL DATA:  NG tube placement. EXAM: NASO G TUBE PLACEMENT WITH FL AND WITH RAD CONTRAST:  5 cc Omnipaque 300 FLUOROSCOPY TIME:  Fluoroscopy Time:  1 minutes and 24 seconds. Number of Acquired Spot Images: 2 COMPARISON:  12-08-2019 CT abdomen pelvis. FINDINGS: An 58 French Dobbhoff tube was placed into the left nasal cavity and advanced into the proximal duodenum. High-resolution spot image demonstrates the tube overlying the proximal duodenum. 5 cc of Omnipaque 300 was injected into the tube to confirm positioning within the proximal small bowel. The patient tolerated the procedure without complication. IMPRESSION: Technically successful placement of 8 Fr Dobbhoff tube terminating within the proximal duodenum. Electronically Signed   By: Stana Bunting M.D.   On: 11/30/2019 17:07   Medications:  I have reviewed the patient's current medications. Prior to Admission:    Medications Prior to Admission  Medication Sig Dispense Refill Last Dose  . amLODipine (NORVASC) 5 MG tablet Take 1 tablet (5 mg total) by mouth daily. 30 tablet 0 Past Month at Unknown time  . Docusate Sodium (DSS) 100 MG CAPS Take 100 mg by mouth daily.    Past Month at Unknown time  . folic acid (FOLVITE) 1 MG tablet Take 1 tablet (1 mg total) by mouth daily. 30 tablet 0 Past Month at Unknown time  . gabapentin (NEURONTIN) 100 MG capsule Take 1 capsule (100 mg total) by mouth 3 (three) times daily. 90 capsule 0 Past Month at Unknown time  . hydrOXYzine (ATARAX/VISTARIL) 50 MG tablet Take 50 mg by mouth 3 (three) times daily as needed.   Past Month at Unknown time  . lisinopril (ZESTRIL) 10 MG tablet Take 1 tablet (10 mg total) by mouth daily. 30 tablet 0 Past Month at Unknown time  . Melatonin 3 MG TABS Take 3 mg by mouth at bedtime.   Past Month at Unknown time  . Multiple Vitamin (MULTI-VITAMIN) tablet Take 1 tablet by mouth daily.   Past Month at Unknown time  . omeprazole (PRILOSEC) 20 MG capsule Take 20 mg by mouth daily.    Past Month at Unknown time  . ondansetron (ZOFRAN) 4 MG tablet Take 1 tablet (4 mg total) by mouth every 6 (six) hours as needed for nausea. 20 tablet 0 Past Month at Unknown time  . oxcarbazepine (TRILEPTAL) 600 MG tablet Take 1 tablet (600 mg total) by mouth 2 (two) times daily. 30 tablet 0 Past Month at Unknown time  . rifaximin (XIFAXAN) 550 MG TABS tablet Take 1 tablet (550 mg total) by mouth 2 (two) times daily. 60 tablet 0 Past Month at Unknown time  . sertraline (ZOLOFT) 50 MG tablet Take 1 tablet (50 mg total) by mouth daily. 30 tablet 0 Past Month at Unknown time  . thiamine 100 MG tablet Take 100 mg by mouth daily.    Past Month at Unknown time  . zinc gluconate 50 MG tablet Take 50 mg by mouth daily.    Past Month at Unknown time  . clotrimazole (LOTRIMIN) 1 % external solution Apply 1 application topically 2 (two) times daily.   prn at prn  . diclofenac  sodium (VOLTAREN) 1 % GEL Apply 2 g topically 4 (four) times daily as needed (pain).   prn at prn  . diphenhydrAMINE (BENADRYL) 25 mg capsule Take 25 mg by mouth every 6 (six) hours as needed for itching or allergies.    prn at prn  . feeding supplement, ENSURE ENLIVE, (ENSURE ENLIVE) LIQD Take 237  mLs by mouth 2 (two) times daily between meals. 237 mL 12   . lactulose (CHRONULAC) 10 GM/15ML solution Take 30 mLs (20 g total) by mouth 2 (two) times daily as needed for mild constipation. 236 mL 0 prn at prn   Scheduled: . Chlorhexidine Gluconate Cloth  6 each Topical Daily  . feeding supplement (PROSource TF)  45 mL Per Tube TID  . folic acid  1 mg Per Tube Daily  . free water  300 mL Per Tube Q4H  . heparin  5,000 Units Subcutaneous Q8H  . lactulose  20 g Per Tube BID  . mouth rinse  15 mL Mouth Rinse BID  . methylPREDNISolone (SOLU-MEDROL) injection  40 mg Intravenous Daily  . multivitamin-lutein  1 capsule Per Tube Daily  . pantoprazole (PROTONIX) IV  40 mg Intravenous Q24H  . potassium & sodium phosphates  1 packet Per Tube BID WC  . rifaximin  550 mg Per Tube BID  . sodium chloride flush  10-40 mL Intracatheter Q12H  . spironolactone  25 mg Per Tube Daily  . thiamine  100 mg Per Tube Daily   Or  . thiamine  100 mg Intravenous Daily   Continuous: . albumin human 25 g (12/02/19 0958)  . feeding supplement (OSMOLITE 1.5 CAL)     LHT:[id number], LORazepam, ondansetron (ZOFRAN) IV, polyethylene glycol, sodium chloride flush, white petrolatum, Zinc Oxide Anti-infectives (From admission, onward)   Start     Dose/Rate Route Frequency Ordered Stop   11/30/19 2315  rifaximin (XIFAXAN) tablet 550 mg        550 mg Per Tube 2 times daily 11/30/19 2228     11/26/19 2200  amoxicillin-clavulanate (AUGMENTIN) 400-57 MG/5ML suspension 800 mg  Status:  Discontinued        800 mg Oral Every 12 hours 11/26/19 1559 11/29/19 0848   11/22/19 0030  piperacillin-tazobactam (ZOSYN) IVPB 3.375 g   Status:  Discontinued        3.375 g 12.5 mL/hr over 240 Minutes Intravenous Every 8 hours 11/20/2019 2334 11/25/19 1007   11/22/2019 2200  rifaximin (XIFAXAN) tablet 550 mg  Status:  Discontinued        550 mg Oral 2 times daily 11/22/2019 2055 11/30/19 2228   11/17/2019 1730  cefTRIAXone (ROCEPHIN) 2 g in sodium chloride 0.9 % 100 mL IVPB        2 g 200 mL/hr over 30 Minutes Intravenous  Once 11/18/2019 1715 11/19/2019 1938     Scheduled Meds: . Chlorhexidine Gluconate Cloth  6 each Topical Daily  . feeding supplement (PROSource TF)  45 mL Per Tube TID  . folic acid  1 mg Per Tube Daily  . free water  300 mL Per Tube Q4H  . heparin  5,000 Units Subcutaneous Q8H  . lactulose  20 g Per Tube BID  . mouth rinse  15 mL Mouth Rinse BID  . methylPREDNISolone (SOLU-MEDROL) injection  40 mg Intravenous Daily  . multivitamin-lutein  1 capsule Per Tube Daily  . pantoprazole (PROTONIX) IV  40 mg Intravenous Q24H  . potassium & sodium phosphates  1 packet Per Tube BID WC  . rifaximin  550 mg Per Tube BID  . sodium chloride flush  10-40 mL Intracatheter Q12H  . spironolactone  25 mg Per Tube Daily  . thiamine  100 mg Per Tube Daily   Or  . thiamine  100 mg Intravenous Daily   Continuous Infusions: . albumin human 25 g (12/02/19 0958)  . feeding  supplement (OSMOLITE 1.5 CAL)     PRN Meds:.ipratropium-albuterol, LORazepam, ondansetron (ZOFRAN) IV, polyethylene glycol, sodium chloride flush, white petrolatum, Zinc Oxide   Assessment: Active Problems:   Hyponatremia with decreased serum osmolality   Alcoholic cirrhosis of liver with ascites (HCC)   Hepatic encephalopathy (HCC)   Goals of care, counseling/discussion   Palliative care by specialist   DNR (do not resuscitate)   Seizure (HCC)   Malnutrition of moderate degree   Plan: Severe alcoholic hepatitis: Patient is commenced on methylprednisolone IV on 10/24 due to discriminant score of 47.8.  [place] score on day 5 is 0.073 which is <0.45,  therefore recommend to continue steroids Recommend to switch to prednisone 40 mg daily for 4 weeks total followed by taper for 2 weeks Continue lactulose and rifaximin No evidence of significant ascites to tap No evidence of portal, hepatic or splenic vein thrombosis Poor p.o. intake secondary to altered mental status.   Continue tube feeds Recommend to start low-dose diuretics as patient appears volume overloaded  Altered mental status: Likely multifactorial Recent seizure activity secondary to severe hyponatremia and now with hypernatremia and hepatic encephalopathy Continue antiepileptic medications Lactulose and rifaximin daily Hypernatremia: worsening serum sodium, increase free water along with tube feeds and monitor serum sodium daily  Overall prognosis is guarded due to active alcohol use.  Appreciate palliative care consult.  Patient is not a liver transplant candidate due to active alcohol use  Follow-up with GI as outpatient.  Dr. Tobi Bastos will cover for the weekend   LOS: 11 days   Taylen Wendland 12/02/2019, 4:19 PM

## 2019-12-02 NOTE — Progress Notes (Signed)
Palliative:  HPI:39 y.o.femalewith past medical history of ETOH abuse, seizures, and alcoholic hepatitisadmitted on 10/18/2021with LUQ pain and seizure at home.CT of abdomen revealed enlarged fatty liver. Found to have severe hyponatremia, hepatic encephalopathy, liver ascites, and ongoing severely elevated ammonia and bilirubin levels.PMT consulted to discuss [place].    I met today at [facility] bedside. She has shown no improvement. She continues with confusion, restlessness/agitation, lethargy. RN reports that she has Ativan but with no improvement in symptoms. Discussed plan to reassess QTc and will try haldol if QTc allows and see if she responds better to haldol.   I called and spoke with husband, Jenny Reichmann. I explained that although there is no worsening or further complications there is no improvement. I explained my concern that in her current state she will eventually have worsening and complications. I fear that no matter how we treat [place] that she is at the end of her life. I encouraged John to consider focus on comfort as the interventions we are providing are only causing her more discomfort without any benefit to her at the end of her life. John was extremely tearful but understands. He wants 24 hours to speak with his sons and family before making transition to comfort care.   I discussed with Dr. Billie Ruddy and she will plan to speak with Jenny Reichmann tomorrow to confirm plans for comfort care. I also updated RN to plan. All questions/concerns addressed. Emotional support provided.   Exam: Lethargic but restless/agitated. Jaundice. Appears to be small amount of dried blood on mouth. Breathing regular, unlabored. Abd distended but soft. Dobhoff in place with feeding. Mittens on bilateral hands.   Plan: - Tentative plan to transition to comfort care tomorrow. Will need to be confirmed with husband, Jenny Reichmann.   25 min  Vinie Sill, NP Palliative Medicine Team Pager [phone] (Please see  [email or web address] for schedule) Team Phone [phone]    Greater than 50%  of this time was spent counseling and coordinating care related to the above assessment and plan

## 2019-12-02 NOTE — Progress Notes (Signed)
PROGRESS NOTE    Ashlee Hawkins  SEG:[id number] DOB: Nov 24, 1980 DOA: 2019-12-11 PCP: [facility]   Chief complaint.  Altered mental status. Brief Narrative:  Patient is a 39 year old female with a history of alcohol abuse, seizure disorder and alcohol hepatitis who initially admitedto ICU on 10/18 with a severe hyponatremia with sodium 108, ammonia 106.She has been continuously drinking alcohol, last drink was before admission. She was also having seizures. Since admission to the ICU, sodium level has went up after giving 3% sodium chloride, she was  also giving lactulose.She is very confused, mental status has not improved since admission. Her total bilirubin was 23.3. Ammonia level still elevated. Patient has been seen by palliative care, long-term prognosis is very poor.  10/23.Increase the lactulose dose, startedrehydration. 10/24.Patient mental status seem to be improving today. Still has very high bilirubin level, consult GI. 10/25.  Patient does not have portal vein thrombosis on MRV study.  Start IV steroids per GI.   Assessment & Plan:   Active Problems:   Hyponatremia with decreased serum osmolality   Alcoholic cirrhosis of liver with ascites (HCC)   Hepatic encephalopathy (HCC)   Goals of care, counseling/discussion   Palliative care by specialist   DNR (do not resuscitate)   Seizure (HCC)   Malnutrition of moderate degree  #1.  Hepatic encephalopathy. --mostly confused and lethargic  PLAN: --cont lactulose 20 mg BID --cont rifaxim  #2.  Decompensated Alcohol liver cirrhosis with ascites. Ultrasound did not show large enough ascites to tap, MRI ruled out portal vein thrombosis. Long-term prognosis guarded.  Patient has extreme elevation of bilirubin. --Please note that patient creatinine level and phosphorus level were not able to be measured due to severe elevation of bilirubin. PLAN: --cont aldactone  --cont IV solumedrol, per  GI --cont IV albumin, per GI  # dysphagia #3.  Anorexia with a severe protein calorie malnutrition. --currently pt can not swallow safely.  Husband wants to initiate tube feed for now. --Dobhoff tube placed by IR 10/27, started tube feed PLAN: --cont tube feed for now via Dobhoff --Need to monitor for refeeding syndrome --empiric daily phos supplement  Hypernatremia --increase free water via Dobhoff tube  4.  Aspiration pneumonia. Completed antibiotics x 5days.  5.  Severe hyponatremia, resolved  6.  Anemia of chronic disease. Continue to follow.  7.  Thrombocytopenia. Secondary to liver cirrhosis.  Stable.  Hypokalemia --replete PRN   DVT prophylaxis: Heparin Code Status: DNR Family Communication: husband updated by palliative care provider.     Status is: Inpatient   Dispo: The patient is from: Home              Anticipated d/c is to: Home              Anticipated d/c date is: > 3 days              Patient currently is not medically stable to d/c. Can not safely swallow.  Husband asked to initiate tube feed.  Not ready for comfort care yet.     I/O last 3 completed shifts: In: 3241.2 [Other:800; VP/XT:0626.9; IV Piggyback:466.5] Out: 1400 [Stool:1400] No intake/output data recorded.     Consultants:   gi  Procedures: none  Antimicrobials: none  Subjective: Pt was having more labored breathing.  When asked if she was short of breath, pt said yes.  Needed 2L O2.     Objective: Vitals:   12/02/19 0500 12/02/19 0546 12/02/19 0813 12/02/19 1205  BP:  (!) 155/92 (!) 123/96 (!) 160/94  Pulse:  99 96 94  Resp:  20 16 16   Temp:  97.8 F (36.6 C) 97.6 F (36.4 C) 97.7 F (36.5 C)  TempSrc:   Oral Oral  SpO2:  100% (!) 82% (!) 84%  Weight: 103.4 kg     Height:        Intake/Output Summary (Last 24 hours) at 12/02/2019 1921 Last data filed at 12/02/2019 1851 Gross per 24 hour  Intake 3241.16 ml  Output 800 ml  Net 2441.16 ml   Filed Weights    11/26/19 0408 12/01/19 0500 12/02/19 0500  Weight: 90.6 kg 91.1 kg 103.4 kg    Examination:  Constitutional: NAD, lethargic, confused, jaundiced.   HEENT: conjunctivae icterus and lids normal, EOMI CV: No cyanosis.   RESP: shallow breaths, increased RR, on 2L GI: large abdomen, Dobhoff tube present Extremities: No effusions, edema in BLE SKIN: warm, dry and intact Rectal tube present outputting dark brown liquid   Data Reviewed: I have personally reviewed following labs and imaging studies  CBC: Recent Labs  Lab 11/26/19 0700 11/27/19 0413 11/29/19 0714 12/01/19 0427 12/02/19 0625  WBC 12.1* 12.2* 9.0 11.0* 14.1*  NEUTROABS 7.9* 8.0* 6.1  --   --   HGB 8.4* 8.3* 9.0* 8.1* 8.8*  HCT 25.2* 25.5* 29.7* 26.0* 29.2*  MCV 110.5* 114.3* 119.8* 119.8* 122.7*  PLT 148* 129* 92* 90* 93*   Basic Metabolic Panel: Recent Labs  Lab 11/28/19 0456 11/29/19 0714 11/30/19 0651 12/01/19 0427 12/02/19 0625  NA 136 139 144 147* 151*  K 3.4* 3.8 3.4* 3.2* 3.2*  CL 104 108 113* 116* 119*  CO2 22 21* 20* 22 23  GLUCOSE 95 98 117* 129* 152*  BUN <5* 6 8 10 11   CREATININE  UNABLE TO REPORT DUE TO ICTERIC INTERFERENCE SDR  UNABLE TO REPORT DUE TO ICTERIC INTERFERENCE SDR  UNABLE TO REPORT DUE TO ICTERUS <0.30* <0.30*  CALCIUM 8.1* 7.8* 7.9* 7.9* 8.2*  MG 2.1 2.2 2.3 2.3 2.4  PHOS UNABLE TO REPORT DUE TO ICTERIC INTERFERENCE SDR  UNABLE TO REPORT DUE TO ICTERIC INTERFERENCE SDR UNABLE TO REPORT DUE TO ICTERUS UNABLE TO RREPORT DUE TO ICTERUS SDR UNABLE TO REPORT DUE TO ICTERUS   GFR: CrCl cannot be calculated (This lab value cannot be used to calculate CrCl because it is not a number: <0.30). Liver Function Tests: Recent Labs  Lab 11/27/19 0413 11/28/19 0456 11/29/19 0714 11/30/19 0651 12/02/19 1358  AST 204* 180* 159* 135* 120*  ALT 58* 55* 52* 48* 42  ALKPHOS 449* 433* 385* 343* 352*  BILITOT 24.8* 26.8* 25.5* 22.5* 19.3*  PROT 5.5* 5.7* 5.3* 5.2* 6.1*  ALBUMIN 2.1* 2.2*  2.1* 2.1* 2.9*   No results for input(s): LIPASE, AMYLASE in the last 168 hours. Recent Labs  Lab 11/27/19 0413 11/28/19 0456 11/30/19 0651  AMMONIA 102* 103* 58*   Coagulation Profile: Recent Labs  Lab 11/27/19 1218 12/02/19 1358  INR 1.8* 1.5*   Cardiac Enzymes: No results for input(s): CKTOTAL, CKMB, CKMBINDEX, TROPONINI in the last 168 hours. BNP (last 3 results) No results for input(s): PROBNP in the last 8760 hours. HbA1C: No results for input(s): HGBA1C in the last 72 hours. CBG: Recent Labs  Lab 12/01/19 2041 12/02/19 0038 12/02/19 0443 12/02/19 0811 12/02/19 1215  GLUCAP 124* 116* 124* 118* 133*   Lipid Profile: No results for input(s): CHOL, HDL, LDLCALC, TRIG, CHOLHDL, LDLDIRECT in the last 72 hours. Thyroid Function  Tests: No results for input(s): TSH, T4TOTAL, FREET4, T3FREE, THYROIDAB in the last 72 hours. Anemia Panel: No results for input(s): VITAMINB12, FOLATE, FERRITIN, TIBC, IRON, RETICCTPCT in the last 72 hours. Sepsis Labs: Recent Labs  Lab 11/27/19 0413  PROCALCITON 0.75    Recent Results (from the past 240 hour(s))  Culture, blood (routine x 2)     Status: None   Collection Time: 11/27/19  1:48 PM   Specimen: BLOOD  Result Value Ref Range Status   Specimen Description BLOOD RIGHT ANTECUBITAL  Final   Special Requests   Final    BOTTLES DRAWN AEROBIC AND ANAEROBIC Blood Culture results may not be optimal due to an inadequate volume of blood received in culture bottles   Culture   Final    NO GROWTH 5 DAYS Performed at [facility], [address], [place], [place] [address]    Report Status 12/02/2019 FINAL  Final  Culture, blood (routine x 2)     Status: None   Collection Time: 11/27/19  1:54 PM   Specimen: BLOOD  Result Value Ref Range Status   Specimen Description BLOOD BLOOD RIGHT HAND  Final   Special Requests   Final    BOTTLES DRAWN AEROBIC AND ANAEROBIC Blood Culture adequate volume   Culture   Final    NO  GROWTH 5 DAYS Performed at [facility], [address], [place], [place] [address]    Report Status 12/02/2019 FINAL  Final         Radiology Studies: No results found.      Scheduled Meds: . Chlorhexidine Gluconate Cloth  6 each Topical Daily  . feeding supplement (PROSource TF)  45 mL Per Tube TID  . folic acid  1 mg Per Tube Daily  . free water  300 mL Per Tube Q4H  . heparin  5,000 Units Subcutaneous Q8H  . lactulose  20 g Per Tube BID  . mouth rinse  15 mL Mouth Rinse BID  . methylPREDNISolone (SOLU-MEDROL) injection  40 mg Intravenous Daily  . multivitamin-lutein  1 capsule Per Tube Daily  . pantoprazole (PROTONIX) IV  40 mg Intravenous Q24H  . potassium & sodium phosphates  1 packet Per Tube BID WC  . rifaximin  550 mg Per Tube BID  . sodium chloride flush  10-40 mL Intracatheter Q12H  . spironolactone  25 mg Per Tube Daily  . thiamine  100 mg Per Tube Daily   Or  . thiamine  100 mg Intravenous Daily   Continuous Infusions: . albumin human 25 g (12/02/19 0958)  . feeding supplement (OSMOLITE 1.5 CAL) 1,000 mL (12/02/19 1821)     LOS: 11 days    Darlin Priestly, MD [facility]   12/02/2019, 7:21 PM

## 2019-12-02 NOTE — Progress Notes (Signed)
PT Cancellation Note  Patient Details Name: MIZUKI HOEL MRN: [id number] DOB: August 18, 1980   Cancelled Treatment:     Pt's room moved closer to nursing station for monitoring, currently on 1 to 1 observation, bed alarm on, mittens on due to agitation. Spoke with nursing who stated to hold PT again today. Will discuss possible need to d/c pt from skilled PT services at this time due to decline and poor progress towards goals.   Jannet Askew 12/02/2019, 11:12 AM

## 2019-12-02 NOTE — Progress Notes (Signed)
  Speech Language Pathology Treatment: Dysphagia  Patient Details Name: Ashlee Hawkins MRN: [id number] DOB: [birth year] Today's Date: 12/02/2019 Time: 1220-1250 SLP Time Calculation (min) (ACUTE ONLY): 30 min  Assessment / Plan / Recommendation Clinical Impression  Pt seen today for ongoing assessment of readiness and safety for oral intake. She required positioning fully upright in bed by SLP and NA upon entering room. Pt continues to present w/ declined mental status/Cognitive status; she was unable to give her first name w/ cues/choice of 2. She could not follow simple instruction w/ max cues/stim, or orient to SLP during OM exam.  Oral assessment revealed more tonic biting on swab stick making it difficult to complete oral care w/ pt. Noted oral searching in response to oral tactile stim from swabs. She did not follow instructions to allow for easy oral care. Pt was distracted and often turned head to side then looked to the other side. Noted dry lips and oral cavity; balm from kit placed on lips. Pt has Dobhoff currently for NG TFs. Upon entering room, she was lying flat in the bed d/t increased motor movements; NSG reported she often slid down in the bed.  Pt presents w/ significantly declined Neurological status and is NOT safe for oral intake at this time. Recommend continue strict NPO status. Consult w/ NSG on results of assessment of pt's status; need for 30+ degrees at all times d/t NG placement w/ continuous TFs; and Frequent Oral Care for hygiene and stimulation of swallowing. ST services will f/u w/ pt's status next 1-3 days for appropriateness for po assessment. Recommend f/u w/ Palliative Care for [place], Dietician.    HPI HPI: Pt is a 39 yo female with a significant history of alcohol abuse, seizure disorder and alcoholic hepatitis presenting to the ED reporting LUQ abdominal pain and a seizure at home on the morning of 11/26/2019.  Patient reports last alcoholic beverage was the morning  of 11/20/2019.  She reports drinking a 1/5th of liquor daily.  ED workup included labs significant for: Na+108, K+ <2.0, Cl <65, Total Bili 20.1, AST 313, ALT 66, Ammonia 106, WBC 11.9, Lactic >11.  Patient was started on NaCL 3% infusion, but Na+ corrected to 116 within 12 hours, and was discontinued.  The patient's husband reported that she stopped taking her anti-convulsant medication 2 weeks ago and began drinking.  He reported witnessing "small" seizure like activity during this time where the patient would stare off and slur her words. She experienced multiple falls during these past two weeks, with reports of hitting her head.  She also developed nausea/vomiting and diarrhea with a temperature of 102 within the past 5 days.      SLP Plan  Continue with current plan of care       Recommendations  Diet recommendations: NPO Medication Administration: Via alternative means                General recommendations:  (Palliative Care f/u) Oral Care Recommendations: Oral care QID;Staff/trained caregiver to provide oral care Follow up Recommendations:  (TBD) SLP Visit Diagnosis: Dysphagia, oropharyngeal phase (R13.12) (Mental/Cognitive status decline) Plan: Continue with current plan of care       GO                 Ashlee Hawkins, [place], CCC-SLP Speech Language Pathologist Rehab Services [phone] [facility] 12/02/2019, 2:22 PM

## 2019-12-03 DIAGNOSIS — R748 Abnormal levels of other serum enzymes: Secondary | ICD-10-CM | POA: Diagnosis present

## 2019-12-03 DIAGNOSIS — E872 Acidosis, unspecified: Secondary | ICD-10-CM | POA: Diagnosis present

## 2019-12-03 DIAGNOSIS — D696 Thrombocytopenia, unspecified: Secondary | ICD-10-CM | POA: Diagnosis present

## 2019-12-03 DIAGNOSIS — E870 Hyperosmolality and hypernatremia: Secondary | ICD-10-CM | POA: Diagnosis not present

## 2019-12-03 DIAGNOSIS — J690 Pneumonitis due to inhalation of food and vomit: Secondary | ICD-10-CM | POA: Clinically undetermined

## 2019-12-03 DIAGNOSIS — K7011 Alcoholic hepatitis with ascites: Secondary | ICD-10-CM | POA: Diagnosis not present

## 2019-12-03 DIAGNOSIS — R791 Abnormal coagulation profile: Secondary | ICD-10-CM | POA: Diagnosis present

## 2019-12-03 DIAGNOSIS — D649 Anemia, unspecified: Secondary | ICD-10-CM | POA: Diagnosis present

## 2019-12-03 DIAGNOSIS — K7682 Hepatic encephalopathy: Secondary | ICD-10-CM | POA: Diagnosis present

## 2019-12-03 DIAGNOSIS — R1310 Dysphagia, unspecified: Secondary | ICD-10-CM

## 2019-12-03 DIAGNOSIS — E440 Moderate protein-calorie malnutrition: Secondary | ICD-10-CM | POA: Insufficient documentation

## 2019-12-03 DIAGNOSIS — J9601 Acute respiratory failure with hypoxia: Secondary | ICD-10-CM | POA: Diagnosis not present

## 2019-12-03 DIAGNOSIS — E876 Hypokalemia: Secondary | ICD-10-CM | POA: Clinically undetermined

## 2019-12-03 LAB — CBC
HCT: 28.7 % — ABNORMAL LOW (ref 36.0–46.0)
Hemoglobin: 8.4 g/dL — ABNORMAL LOW (ref 12.0–15.0)
MCH: 37.2 pg — ABNORMAL HIGH (ref 26.0–34.0)
MCHC: 29.3 g/dL — ABNORMAL LOW (ref 30.0–36.0)
MCV: 127 fL — ABNORMAL HIGH (ref 80.0–100.0)
Platelets: 95 10*3/uL — ABNORMAL LOW (ref 150–400)
RBC: 2.26 MIL/uL — ABNORMAL LOW (ref 3.87–5.11)
RDW: 24.5 % — ABNORMAL HIGH (ref 11.5–15.5)
WBC: 11.3 10*3/uL — ABNORMAL HIGH (ref 4.0–10.5)
nRBC: 1.9 % — ABNORMAL HIGH (ref 0.0–0.2)

## 2019-12-03 LAB — GLUCOSE, CAPILLARY
Glucose-Capillary: 147 mg/dL — ABNORMAL HIGH (ref 70–99)
Glucose-Capillary: 131 mg/dL — ABNORMAL HIGH (ref 70–99)
Glucose-Capillary: 155 mg/dL — ABNORMAL HIGH (ref 70–99)
Glucose-Capillary: 141 mg/dL — ABNORMAL HIGH (ref 70–99)
Glucose-Capillary: 121 mg/dL — ABNORMAL HIGH (ref 70–99)

## 2019-12-03 LAB — BASIC METABOLIC PANEL
Anion gap: 8 (ref 5–15)
BUN: 12 mg/dL (ref 6–20)
CO2: 26 mmol/L (ref 22–32)
Calcium: 8.2 mg/dL — ABNORMAL LOW (ref 8.9–10.3)
Chloride: 119 mmol/L — ABNORMAL HIGH (ref 98–111)
Creatinine, Ser: <0.3 mg/dL — ABNORMAL LOW (ref 0.44–1.00)
Glucose, Bld: 140 mg/dL — ABNORMAL HIGH (ref 70–99)
Potassium: 3.8 mmol/L (ref 3.5–5.1)
Sodium: 153 mmol/L — ABNORMAL HIGH (ref 135–145)

## 2019-12-03 LAB — MAGNESIUM: Magnesium: 2.6 mg/dL — ABNORMAL HIGH (ref 1.7–2.4)

## 2019-12-03 MED ORDER — FUROSEMIDE 10 MG/ML IJ SOLN
40.0000 mg | Freq: Once | INTRAMUSCULAR | Status: AC
  Administered 2019-12-03: 40 mg via INTRAVENOUS
  Filled 2019-12-03: qty 4

## 2019-12-03 MED ORDER — PANTOPRAZOLE SODIUM 40 MG PO PACK
40.0000 mg | PACK | Freq: Every day | ORAL | Status: DC
  Filled 2019-12-03 (×4): qty 20

## 2019-12-03 MED ORDER — PREDNISONE 5 MG/ML PO CONC
40.0000 mg | Freq: Every day | ORAL | Status: DC
  Filled 2019-12-03: qty 8

## 2019-12-03 MED ORDER — FUROSEMIDE 10 MG/ML IJ SOLN
40.0000 mg | Freq: Once | INTRAMUSCULAR | Status: AC
  Administered 2019-12-03: 14:00:00 40 mg via INTRAVENOUS
  Filled 2019-12-03: qty 4

## 2019-12-03 MED ORDER — FREE WATER
400.0000 mL | Status: DC
  Administered 2019-12-03 – 2019-12-05 (×10): 400 mL

## 2019-12-03 MED ORDER — PREDNISONE 20 MG PO TABS
40.0000 mg | ORAL_TABLET | Freq: Every day | ORAL | Status: DC
  Administered 2019-12-03 – 2019-12-04 (×2): 40 mg
  Filled 2019-12-03 (×2): qty 2

## 2019-12-03 MED ORDER — SPIRONOLACTONE 100 MG PO TABS
100.0000 mg | ORAL_TABLET | Freq: Every day | ORAL | Status: DC
  Administered 2019-12-03 – 2019-12-04 (×2): 100 mg
  Filled 2019-12-03: qty 4
  Filled 2019-12-03 (×3): qty 1

## 2019-12-03 NOTE — Progress Notes (Signed)
Wyline Mood , MD [address], [place], [place], [address] [address], [place], [place], [address] Phone: [phone]  Fax: [phone]   CALAIS SVEHLA is being followed for severe alcoholic hepatitis    Subjective:   Looks very unwell, not able to communicate, laboured breathing    Objective: Vital signs in last 24 hours: Vitals:   12/03/19 0104 12/03/19 0457 12/03/19 0803 12/03/19 1154  BP: 136/83 134/81 120/84 (!) 130/91  Pulse: 98 99 96 95  Resp: (!) 24 (!) 22 17 (!) 22  Temp: 97.9 F (36.6 C) 97.9 F (36.6 C) 97.7 F (36.5 C) 98 F (36.7 C)  TempSrc:   Oral Oral  SpO2: 92% 93% 97% 92%  Weight:      Height:       Weight change:   Intake/Output Summary (Last 24 hours) at 12/03/2019 1430 Last data filed at 12/03/2019 0533 Gross per 24 hour  Intake 2001.43 ml  Output 800 ml  Net 1201.43 ml     Exam: Heart:: Regular rate and rhythm Lungs: B/l upper airway ronchi, decreased air entry b/l  Abdomen: soft, nontender, normal bowel sounds  Icterus+++ CNS: confused , unable to communicate    Lab Results: @LABTEST2 @ Micro Results: Recent Results (from the past 240 hour(s))  Culture, blood (routine x 2)     Status: None   Collection Time: 11/27/19  1:48 PM   Specimen: BLOOD  Result Value Ref Range Status   Specimen Description BLOOD RIGHT ANTECUBITAL  Final   Special Requests   Final    BOTTLES DRAWN AEROBIC AND ANAEROBIC Blood Culture results may not be optimal due to an inadequate volume of blood received in culture bottles   Culture   Final    NO GROWTH 5 DAYS Performed at [facility], [address] [place], [place]    Report Status 12/02/2019 FINAL  Final  Culture, blood (routine x 2)     Status: None   Collection Time: 11/27/19  1:54 PM   Specimen: BLOOD  Result Value Ref Range Status   Specimen Description BLOOD BLOOD RIGHT HAND  Final   Special Requests   Final    BOTTLES DRAWN AEROBIC AND  ANAEROBIC Blood Culture adequate volume   Culture   Final    NO GROWTH 5 DAYS Performed at [facility], [address], [place], [place]    Report Status 12/02/2019 FINAL  Final   Studies/Results: No results found. Medications: I have reviewed the patient's current medications. Scheduled Meds: . Chlorhexidine Gluconate Cloth  6 each Topical Daily  . feeding supplement (PROSource TF)  45 mL Per Tube TID  . folic acid  1 mg Per Tube Daily  . free water  400 mL Per Tube Q4H  . heparin  5,000 Units Subcutaneous Q8H  . lactulose  20 g Per Tube BID  . mouth rinse  15 mL Mouth Rinse BID  . multivitamin-lutein  1 capsule Per Tube Daily  . pantoprazole sodium  40 mg Per Tube Daily  . potassium & sodium phosphates  1 packet Per Tube BID WC  . predniSONE  40 mg Per Tube Q breakfast  . rifaximin  550 mg Per Tube BID  . sodium chloride flush  10-40 mL Intracatheter Q12H  . spironolactone  100 mg Per Tube Daily  . thiamine  100 mg Per Tube Daily   Or  . thiamine  100 mg Intravenous Daily  Continuous Infusions: . feeding supplement (OSMOLITE 1.5 CAL) 1,000 mL (12/03/19 0522)   PRN Meds:.haloperidol lactate, ipratropium-albuterol, LORazepam, ondansetron (ZOFRAN) IV, polyethylene glycol, sodium chloride flush, white petrolatum, Zinc Oxide   Assessment: Principal Problem:   Decompensated hepatic cirrhosis (HCC) Active Problems:   Hyponatremia with decreased serum osmolality   Alcoholic cirrhosis of liver with ascites (HCC)   Goals of care, counseling/discussion   Palliative care by specialist   DNR (do not resuscitate)   Seizure (HCC)   Severe protein-calorie malnutrition (HCC)   Hypernatremia   Acute hypoxemic respiratory failure (HCC)   Acute hepatic encephalopathy   Hyperbilirubinemia   Elevated liver enzymes   Elevated INR   Lactic acidosis   Alcoholic hepatitis with ascites   Dysphagia   Anemia   Thrombocytopenia (HCC)   Aspiration pneumonia (HCC)    Hypokalemia  Admitted with severe alcoholic hepatitis - on steroids . No significant ascites to tap  Hb stable. WCC 11.4. Hypernatremia and hyperchloremia.On aldactone, Xifaxan.  No LFT's from today. . INR 1.5 yesterday. Appears very sick today , unable to communicate- encephalopathic.    Plan: 1. Very poor prognosis  2. Suggest discuss goals of care with family and involve palliative care. 3. Continue steroids- if infection suspected then stop. Hold nutrition for now as she is not able to communicate, higher risk to aspirate.   No further input from GI, will sign off    LOS: 12 days   Wyline Mood, MD 12/03/2019, 2:30 PM

## 2019-12-03 NOTE — Progress Notes (Signed)
PROGRESS NOTE    Ashlee Hawkins  ZOX:[id number] DOB: 11/16/1980 DOA: 11/07/2019 [facility]CP: [facility]   Chief complaint.  Altered mental status. Brief Narrative:  Patient is a 39 year old female with a history of alcohol abuse, seizure disorder and alcohol hepatitis who initially admitedto ICU on 10/18 with a severe hyponatremia with sodium 108, ammonia 106.She has been continuously drinking alcohol, last drink was before admission. She was also having seizures. Since admission to the ICU, sodium level has went up after giving 3% sodium chloride, she was  also giving lactulose.She is very confused, mental status has not improved since admission. Her total bilirubin was 23.3. Ammonia level still elevated. Patient has been seen by palliative care, long-term prognosis is very poor.  10/23.Increase the lactulose dose, startedrehydration. 10/24.Patient mental status seem to be improving today. Still has very high bilirubin level, consult GI. 10/25.  Patient does not have portal vein thrombosis on MRV study.  Start IV steroids per GI.   Assessment & Plan:   Principal Problem:   Decompensated hepatic cirrhosis (HCC) Active Problems:   Hyponatremia with decreased serum osmolality   Alcoholic cirrhosis of liver with ascites (HCC)   Goals of care, counseling/discussion   Palliative care by specialist   DNR (do not resuscitate)   Seizure (HCC)   Severe protein-calorie malnutrition (HCC)   Hypernatremia   Acute hypoxemic respiratory failure (HCC)   Acute hepatic encephalopathy   Hyperbilirubinemia   Elevated liver enzymes   Elevated INR   Lactic acidosis   Alcoholic hepatitis with ascites   Dysphagia   Anemia   Thrombocytopenia (HCC)   Aspiration pneumonia (HCC)   Hypokalemia   Malnutrition of moderate degree   Acute hypoxic respiratory failure --likely due to fluid overload.  O2 requirement increasing, up to 5L this afternoon. PLAN: --cont suppl O2 to  maintain sats >=92% --IV lasix 40 mg x2 for volume removal  #1.  Hepatic encephalopathy. --mostly confused and lethargic  PLAN: --cont lactulose 20 mg BID --cont rifaximin 550 mg BID  #2.  Decompensated Alcohol liver cirrhosis with ascites. Ultrasound did not show large enough ascites to tap, MRI ruled out portal vein thrombosis. Long-term prognosis guarded.  Patient has extreme elevation of bilirubin. --Please note that patient creatinine level and phosphorus level were not able to be measured due to severe elevation of bilirubin. --started on IV solumedrol and IV albumin, per GI PLAN: --increase aldactone to 100 mg daily --transition steroid to prednisone 40 mg daily --cont IV albumin, per GI  # dysphagia #3.  Anorexia with a severe protein calorie malnutrition. --currently pt can not swallow safely.  Husband wants to initiate tube feed for now. --Dobhoff tube placed by IR 10/27, started tube feed.  However, tube feed material found around pt's mouth today, concerning for regurgitation. PLAN: --Hold tube feed for now for concern of aspiration  Hypernatremia --increase again free water via Dobhoff tube  4.  Aspiration pneumonia. Completed antibiotics x 5days.  5.  Severe hyponatremia, resolved  6.  Anemia of chronic disease. Continue to follow.  7.  Thrombocytopenia. Secondary to liver cirrhosis.  Stable.  Hypokalemia --replete PRN   DVT prophylaxis: Heparin Code Status: DNR Family Communication: husband updated on the phone this afternoon.  I recommended comfort care now.  Husband said he will have to come see pt first before making that decision.  Husband said he may come see pt today.  Checked out to night oncall to make pt comfort care if husband gives permission.  Status is: Inpatient   Dispo: The patient is from: Home              Anticipated d/c is to: Home              Anticipated d/c date is: > 3 days              Patient currently is not medically  stable to d/c. Pt is at end of life.  Husband to make decision on goals of care.     I/O last 3 completed shifts: In: 3962.6 [Other:800; NG/GT:2568.7; IV Piggyback:593.9] Out: 1600 [Stool:1600] No intake/output data recorded.     Consultants:   gi  Procedures: none  Antimicrobials: none  Subjective: Pt's O2 requirement has been increasing, up to 5L.  Tube feed material found around pt's mouth, concern for regurgitation, so tube feed held.  Pt confused, but denied pain.    Talked with husband this afternoon.  Pt said he needs to come see the pt before making any decision for comfort care.     Objective: Vitals:   12/03/19 0104 12/03/19 0457 12/03/19 0803 12/03/19 1154  BP: 136/83 134/81 120/84 (!) 130/91  Pulse: 98 99 96 95  Resp: (!) 24 (!) 22 17 (!) 22  Temp: 97.9 F (36.6 C) 97.9 F (36.6 C) 97.7 F (36.5 C) 98 F (36.7 C)  TempSrc:   Oral Oral  SpO2: 92% 93% 97% 92%  Weight:      Height:        Intake/Output Summary (Last 24 hours) at 12/03/2019 1715 Last data filed at 12/03/2019 0533 Gross per 24 hour  Intake 1521.43 ml  Output 800 ml  Net 721.43 ml   Filed Weights   11/26/19 0408 12/01/19 0500 12/02/19 0500  Weight: 90.6 kg 91.1 kg 103.4 kg    Examination:  Constitutional: NAD, somnolent, confused, no coherent, generalized swelling HEENT: conjunctivae icterus, and lids normal, EOMI CV: No cyanosis.   RESP: increased RR, more labored breathing, on 2L SKIN: warm, dry and intact  Rectal tube present outputting dark brown liquid   Data Reviewed: I have personally reviewed following labs and imaging studies  CBC: Recent Labs  Lab 11/27/19 0413 11/29/19 0714 12/01/19 0427 12/02/19 0625 12/03/19 0618  WBC 12.2* 9.0 11.0* 14.1* 11.3*  NEUTROABS 8.0* 6.1  --   --   --   HGB 8.3* 9.0* 8.1* 8.8* 8.4*  HCT 25.5* 29.7* 26.0* 29.2* 28.7*  MCV 114.3* 119.8* 119.8* 122.7* 127.0*  PLT 129* 92* 90* 93* 95*   Basic Metabolic Panel: Recent Labs    Lab 11/28/19 0456 11/28/19 0456 11/29/19 0714 11/30/19 0651 12/01/19 0427 12/02/19 0625 12/03/19 0618  NA 136   < > 139 144 147* 151* 153*  K 3.4*   < > 3.8 3.4* 3.2* 3.2* 3.8  CL 104   < > 108 113* 116* 119* 119*  CO2 22   < > 21* 20* 22 23 26   GLUCOSE 95   < > 98 117* 129* 152* 140*  BUN <5*   < > 6 8 10 11 12   CREATININE  UNABLE TO REPORT DUE TO ICTERIC INTERFERENCE SDR   < >  UNABLE TO REPORT DUE TO ICTERIC INTERFERENCE SDR  UNABLE TO REPORT DUE TO ICTERUS <0.30* <0.30* <0.30*  CALCIUM 8.1*   < > 7.8* 7.9* 7.9* 8.2* 8.2*  MG 2.1   < > 2.2 2.3 2.3 2.4 2.6*  PHOS UNABLE TO REPORT DUE TO ICTERIC INTERFERENCE SDR  --  UNABLE TO REPORT DUE TO ICTERIC INTERFERENCE SDR UNABLE TO REPORT DUE TO ICTERUS UNABLE TO RREPORT DUE TO ICTERUS SDR UNABLE TO REPORT DUE TO ICTERUS  --    < > = values in this interval not displayed.   GFR: CrCl cannot be calculated (This lab value cannot be used to calculate CrCl because it is not a number: <0.30). Liver Function Tests: Recent Labs  Lab 11/27/19 0413 11/28/19 0456 11/29/19 0714 11/30/19 0651 12/02/19 1358  AST 204* 180* 159* 135* 120*  ALT 58* 55* 52* 48* 42  ALKPHOS 449* 433* 385* 343* 352*  BILITOT 24.8* 26.8* 25.5* 22.5* 19.3*  PROT 5.5* 5.7* 5.3* 5.2* 6.1*  ALBUMIN 2.1* 2.2* 2.1* 2.1* 2.9*   No results for input(s): LIPASE, AMYLASE in the last 168 hours. Recent Labs  Lab 11/27/19 0413 11/28/19 0456 11/30/19 0651  AMMONIA 102* 103* 58*   Coagulation Profile: Recent Labs  Lab 11/27/19 1218 12/02/19 1358  INR 1.8* 1.5*   Cardiac Enzymes: No results for input(s): CKTOTAL, CKMB, CKMBINDEX, TROPONINI in the last 168 hours. BNP (last 3 results) No results for input(s): PROBNP in the last 8760 hours. HbA1C: No results for input(s): HGBA1C in the last 72 hours. CBG: Recent Labs  Lab 12/02/19 2123 12/03/19 0103 12/03/19 0458 12/03/19 0800 12/03/19 1203  GLUCAP 119* 147* 131* 155* 141*   Lipid Profile: No results for  input(s): CHOL, HDL, LDLCALC, TRIG, CHOLHDL, LDLDIRECT in the last 72 hours. Thyroid Function Tests: No results for input(s): TSH, T4TOTAL, FREET4, T3FREE, THYROIDAB in the last 72 hours. Anemia Panel: No results for input(s): VITAMINB12, FOLATE, FERRITIN, TIBC, IRON, RETICCTPCT in the last 72 hours. Sepsis Labs: Recent Labs  Lab 11/27/19 0413  PROCALCITON 0.75    Recent Results (from the past 240 hour(s))  Culture, blood (routine x 2)     Status: None   Collection Time: 11/27/19  1:48 PM   Specimen: BLOOD  Result Value Ref Range Status   Specimen Description BLOOD RIGHT ANTECUBITAL  Final   Special Requests   Final    BOTTLES DRAWN AEROBIC AND ANAEROBIC Blood Culture results may not be optimal due to an inadequate volume of blood received in culture bottles   Culture   Final    NO GROWTH 5 DAYS Performed at [facility], [address], [place], [place] [address]    Report Status 12/02/2019 FINAL  Final  Culture, blood (routine x 2)     Status: None   Collection Time: 11/27/19  1:54 PM   Specimen: BLOOD  Result Value Ref Range Status   Specimen Description BLOOD BLOOD RIGHT HAND  Final   Special Requests   Final    BOTTLES DRAWN AEROBIC AND ANAEROBIC Blood Culture adequate volume   Culture   Final    NO GROWTH 5 DAYS Performed at [facility], [address], [place], [place] [address]    Report Status 12/02/2019 FINAL  Final         Radiology Studies: No results found.      Scheduled Meds: . Chlorhexidine Gluconate Cloth  6 each Topical Daily  . feeding supplement (PROSource TF)  45 mL Per Tube TID  . folic acid  1 mg Per Tube Daily  . free water  400 mL Per Tube Q4H  . heparin  5,000 Units Subcutaneous Q8H  . lactulose  20 g Per Tube BID  . mouth rinse  15 mL Mouth Rinse BID  . multivitamin-lutein  1 capsule Per  Tube Daily  . pantoprazole sodium  40 mg Per Tube Daily  . potassium & sodium phosphates  1 packet Per Tube BID WC  .  predniSONE  40 mg Per Tube Q breakfast  . rifaximin  550 mg Per Tube BID  . sodium chloride flush  10-40 mL Intracatheter Q12H  . spironolactone  100 mg Per Tube Daily  . thiamine  100 mg Per Tube Daily   Or  . thiamine  100 mg Intravenous Daily   Continuous Infusions: . feeding supplement (OSMOLITE 1.5 CAL) 1,000 mL (12/03/19 0522)     LOS: 12 days    Darlin Priestly, MD [facility]   12/03/2019, 5:15 PM

## 2019-12-03 NOTE — Progress Notes (Signed)
PT Cancellation Note  Patient Details Name: Ashlee Hawkins MRN: [id number] DOB: Dec 27, 1980   Cancelled Treatment:    Reason Eval/Treat Not Completed: Fatigue/lethargy limiting ability to participate; Per nursing pt not appropriate for PT services this date secondary to lethargy and inability to follow commands.  Will attempt to see pt at a future date/time as medically appropriate.     Ovidio Hanger PT, DPT 12/03/19, 9:19 AM

## 2019-12-03 NOTE — Progress Notes (Signed)
Notified MD,  Breathing is labored, uneven, and tachypneic.  5L at 94%

## 2019-12-03 NOTE — Progress Notes (Signed)
Rounded on patient and found substance closely resembling tube feed on patient's lips.  Mouth care completed thoroughly.  Pt in no new acute distress, tube feed stopped at this time.  To notify primary MD at this time

## 2019-12-04 ENCOUNTER — Inpatient Hospital Stay: Payer: No Typology Code available for payment source

## 2019-12-04 LAB — GLUCOSE, CAPILLARY
Glucose-Capillary: 122 mg/dL — ABNORMAL HIGH (ref 70–99)
Glucose-Capillary: 128 mg/dL — ABNORMAL HIGH (ref 70–99)
Glucose-Capillary: 129 mg/dL — ABNORMAL HIGH (ref 70–99)
Glucose-Capillary: 143 mg/dL — ABNORMAL HIGH (ref 70–99)
Glucose-Capillary: 97 mg/dL (ref 70–99)

## 2019-12-04 LAB — BASIC METABOLIC PANEL
Anion gap: 9 (ref 5–15)
BUN: 12 mg/dL (ref 6–20)
CO2: 34 mmol/L — ABNORMAL HIGH (ref 22–32)
Calcium: 8.1 mg/dL — ABNORMAL LOW (ref 8.9–10.3)
Chloride: 108 mmol/L (ref 98–111)
Creatinine, Ser: <0.3 mg/dL — ABNORMAL LOW (ref 0.44–1.00)
Glucose, Bld: 147 mg/dL — ABNORMAL HIGH (ref 70–99)
Potassium: 3.1 mmol/L — ABNORMAL LOW (ref 3.5–5.1)
Sodium: 151 mmol/L — ABNORMAL HIGH (ref 135–145)

## 2019-12-04 LAB — CBC
HCT: 27.6 % — ABNORMAL LOW (ref 36.0–46.0)
Hemoglobin: 8.5 g/dL — ABNORMAL LOW (ref 12.0–15.0)
MCH: 37.9 pg — ABNORMAL HIGH (ref 26.0–34.0)
MCHC: 30.8 g/dL (ref 30.0–36.0)
MCV: 123.2 fL — ABNORMAL HIGH (ref 80.0–100.0)
Platelets: 96 10*3/uL — ABNORMAL LOW (ref 150–400)
RBC: 2.24 MIL/uL — ABNORMAL LOW (ref 3.87–5.11)
RDW: 23.9 % — ABNORMAL HIGH (ref 11.5–15.5)
WBC: 12.4 10*3/uL — ABNORMAL HIGH (ref 4.0–10.5)
nRBC: 1.4 % — ABNORMAL HIGH (ref 0.0–0.2)

## 2019-12-04 LAB — MAGNESIUM: Magnesium: 2.2 mg/dL (ref 1.7–2.4)

## 2019-12-04 IMAGING — DX DG CHEST 1V PORT
1 series · 1 of 1 positions shown · non-contrast
Comparison: Chest radiograph dated [DATE]

CLINICAL DATA: Hypoxia

EXAM:
PORTABLE CHEST 1 VIEW

[chest ap]
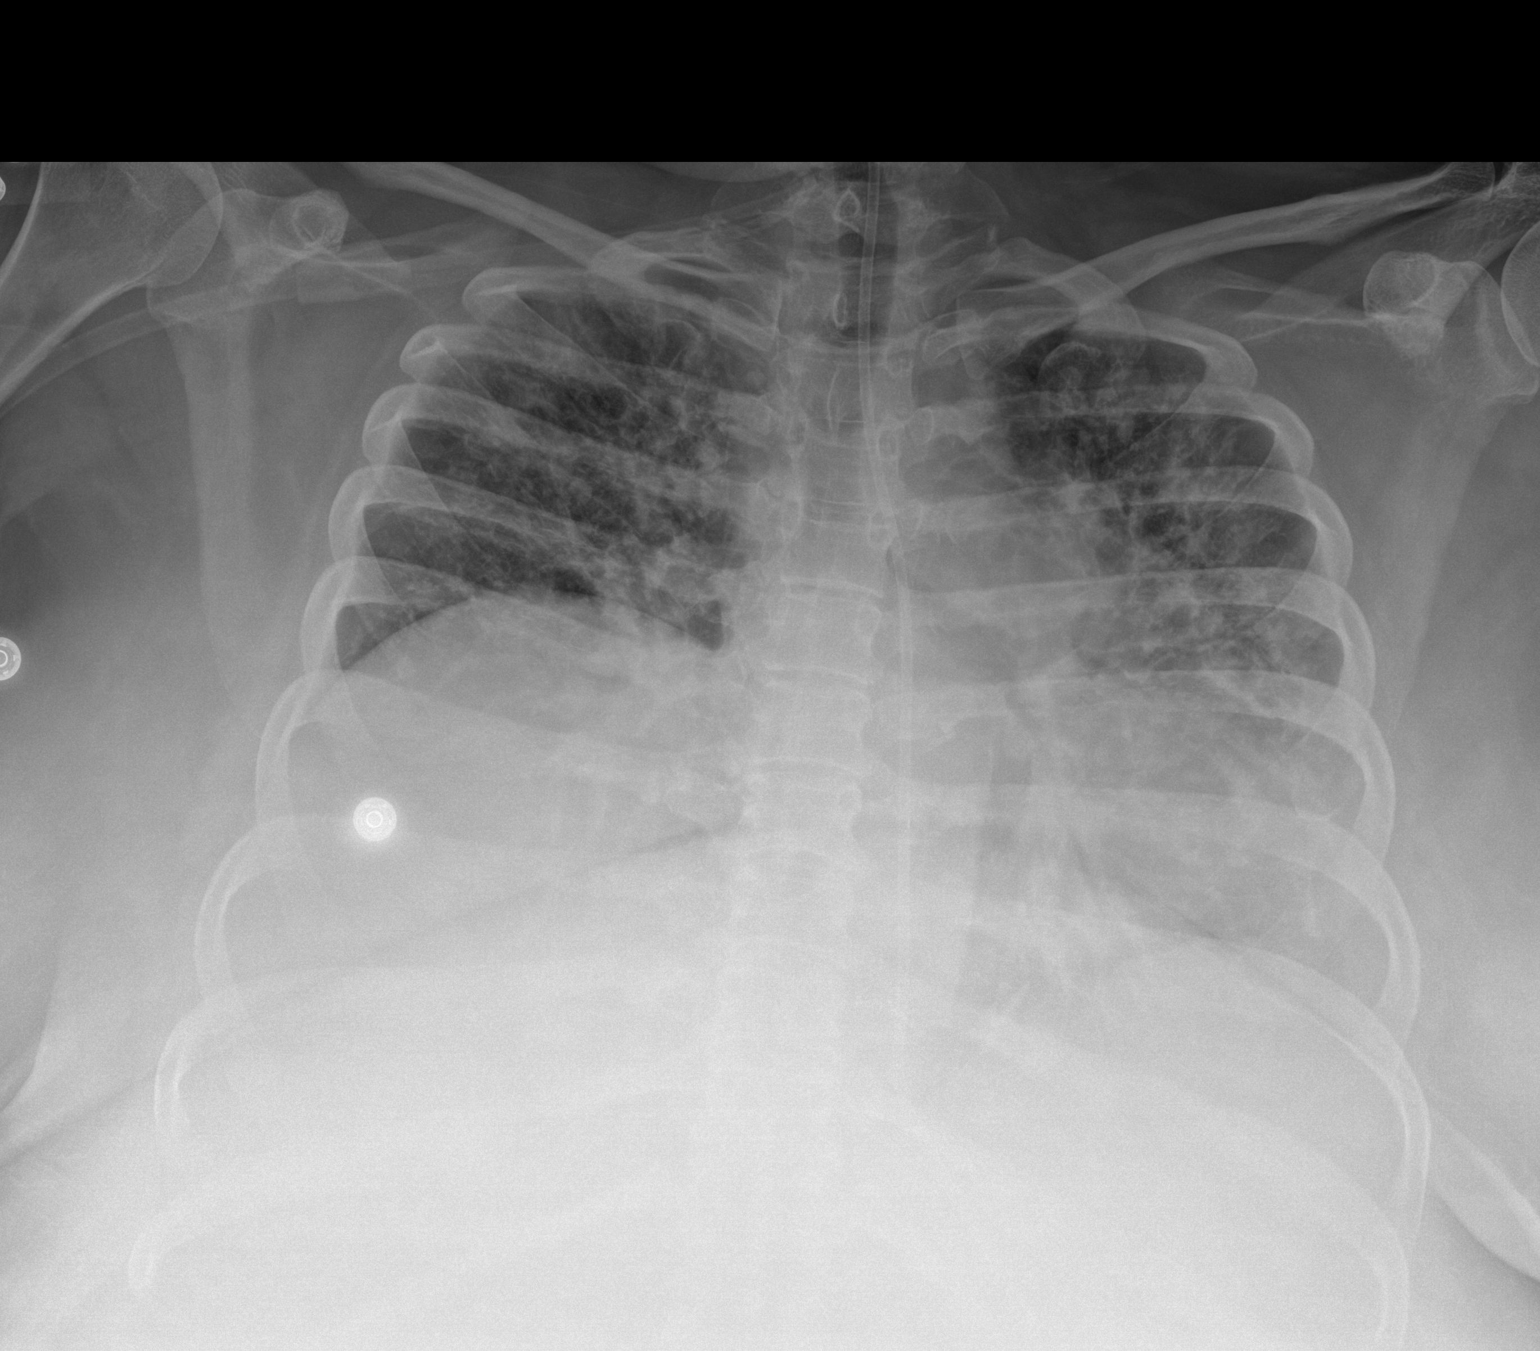

[1 of 1 positions shown; findings below may reference images not displayed]

FINDINGS: The heart size is normal accounting for technique. The lung volumes
are low. There are moderate diffuse bilateral interstitial and
airspace opacities which are increased since prior exam. There is no
significant pleural effusion or pneumothorax. The osseous structures
are intact. An enteric tube enters the stomach and terminates below
the field of view.
IMPRESSION: Moderate diffuse bilateral interstitial and airspace opacities,
increased since prior exam.

## 2019-12-04 MED ORDER — FUROSEMIDE 10 MG/ML IJ SOLN
40.0000 mg | Freq: Two times a day (BID) | INTRAMUSCULAR | Status: DC
  Administered 2019-12-04 (×2): 40 mg via INTRAVENOUS
  Filled 2019-12-04 (×2): qty 4

## 2019-12-04 MED ORDER — ZINC OXIDE 11.3 % EX CREA
1.0000 "application " | TOPICAL_CREAM | CUTANEOUS | Status: DC | PRN
  Filled 2019-12-04 (×2): qty 56

## 2019-12-04 NOTE — Progress Notes (Addendum)
PROGRESS NOTE    Ashlee Hawkins  EHM:[id number] DOB: [birth year] DOA: 12-07-2019 PCP: [facility]   Chief complaint.  Altered mental status. Brief Narrative:  Patient is a 39 year old female with a history of alcohol abuse, seizure disorder and alcohol hepatitis who initially admitedto ICU on 10/18 with a severe hyponatremia with sodium 108, ammonia 106.She has been continuously drinking alcohol, last drink was before admission. She was also having seizures. Since admission to the ICU, sodium level has went up after giving 3% sodium chloride, she was  also giving lactulose.She is very confused, mental status has not improved since admission. Her total bilirubin was 23.3. Ammonia level still elevated. Patient has been seen by palliative care, long-term prognosis is very poor.  10/23.Increase the lactulose dose, startedrehydration. 10/24.Patient mental status seem to be improving today. Still has very high bilirubin level, consult GI. 10/25.  Patient does not have portal vein thrombosis on MRV study.  Start IV steroids per GI.   Assessment & Plan:   Principal Problem:   Decompensated hepatic cirrhosis (HCC) Active Problems:   Hyponatremia with decreased serum osmolality   Alcoholic cirrhosis of liver with ascites (HCC)   Goals of care, counseling/discussion   Palliative care by specialist   DNR (do not resuscitate)   Seizure (HCC)   Severe protein-calorie malnutrition (HCC)   Hypernatremia   Acute hypoxemic respiratory failure (HCC)   Acute hepatic encephalopathy   Hyperbilirubinemia   Elevated liver enzymes   Elevated INR   Lactic acidosis   Alcoholic hepatitis with ascites   Dysphagia   Anemia   Thrombocytopenia (HCC)   Aspiration pneumonia (HCC)   Hypokalemia   Malnutrition of moderate degree   Acute hypoxic respiratory failure 2/2 acute pulm edema --likely due to fluid buildup in the setting of cirrhosis.  O2 requirement increasing, up to  5L this afternoon. --CXR today showed increased "diffuse bilateral interstitial and airspace opacities" consistent with pulm edema. PLAN: --cont suppl O2 to maintain sats >=92% --cont IV lasix 40 mg BID --cont aldactone 100 mg daily  Hepatic encephalopathy. Severe hyperbilirubinemia  --mostly confused and lethargic  PLAN: --cont lactulose 20 mg BID --cont rifaximin 550 mg BID  #2.  Decompensated Alcohol liver cirrhosis with ascites. Ultrasound did not show large enough ascites to tap, MRI ruled out portal vein thrombosis. Long-term prognosis guarded.  Patient has extreme elevation of bilirubin. --Please note that patient creatinine level and phosphorus level were not able to be measured due to severe elevation of bilirubin. --started on IV solumedrol and IV albumin, per GI PLAN: --cont aldactone 100 mg daily --cont prednisone 40 mg daily --cont IV albumin, per GI  # dysphagia #3.  Anorexia with a severe protein calorie malnutrition. --currently pt can not swallow safely.  Husband wants to initiate tube feed for now. --Dobhoff tube placed by IR 10/27, started tube feed.  However, tube feed material found around pt's mouth today, concerning for regurgitation. PLAN: --resume tube feed --empiric supplement with phos  Hypernatremia --cont free water 400 ml q4h  4.  Aspiration pneumonia. Completed antibiotics x 5days.  5.  Severe hyponatremia, resolved  6.  Anemia of chronic disease. Continue to follow.  7.  Thrombocytopenia. Secondary to liver cirrhosis.  Stable.  Hypokalemia --replete PRN   DVT prophylaxis: Heparin Code Status: DNR Family Communication: husband updated on the phone 10/30.  I recommended comfort care now.  Husband said he will have to come see pt first before making that decision.  Status is: Inpatient   Dispo: The patient is from: Home              Anticipated d/c is to: Home              Anticipated d/c date is: > 3 days               Patient currently is not medically stable to d/c. Pt is at end of life.  Husband to make decision on goals of care.     I/O last 3 completed shifts: In: 594 [NG/GT:594] Out: 2800 [Urine:1200; Stool:1600] Total I/O In: -  Out: 1000 [Urine:1000]     Consultants:   gi  Procedures: none  Antimicrobials: none  Subjective: Husband did not come see pt yesterday.  Per nursing, pt had good urine output with diuresis.  Tube feed resumed last night.  Still somnolent this morning.   Objective: Vitals:   12/03/19 2212 12/04/19 0507 12/04/19 0734 12/04/19 1053  BP: (!) 135/91 (!) 126/93 119/73 114/72  Pulse: 87 98 91 88  Resp: (!) 22 (!) 23 (!) 22 (!) 24  Temp: 98.6 F (37 C) 97.9 F (36.6 C) 97.7 F (36.5 C) 99.5 F (37.5 C)  TempSrc: Oral  Oral Oral  SpO2: 98% 96% 100% 100%  Weight:      Height:        Intake/Output Summary (Last 24 hours) at 12/04/2019 1143 Last data filed at 12/04/2019 1000 Gross per 24 hour  Intake --  Output 3000 ml  Net -3000 ml   Filed Weights   11/26/19 0408 12/01/19 0500 12/02/19 0500  Weight: 90.6 kg 91.1 kg 103.4 kg    Examination:  Constitutional: NAD, somnolent, severely jaundiced HEENT: conjunctivae icterus and lids normal, EOMI CV: No cyanosis.   RESP: scattered crackles, on 5L Extremities: No effusions, edema in BLE SKIN: warm, dry and intact, with ecchymosis    Rectal tube present outputting dark brown liquid   Data Reviewed: I have personally reviewed following labs and imaging studies  CBC: Recent Labs  Lab 11/29/19 0714 12/01/19 0427 12/02/19 0625 12/03/19 0618 12/04/19 0444  WBC 9.0 11.0* 14.1* 11.3* 12.4*  NEUTROABS 6.1  --   --   --   --   HGB 9.0* 8.1* 8.8* 8.4* 8.5*  HCT 29.7* 26.0* 29.2* 28.7* 27.6*  MCV 119.8* 119.8* 122.7* 127.0* 123.2*  PLT 92* 90* 93* 95* 96*   Basic Metabolic Panel: Recent Labs  Lab 11/28/19 0456 11/28/19 0456 11/29/19 0714 11/29/19 0714 11/30/19 0651 12/01/19 0427  12/02/19 0625 12/03/19 0618 12/04/19 0444  NA 136   < > 139   < > 144 147* 151* 153* 151*  K 3.4*   < > 3.8   < > 3.4* 3.2* 3.2* 3.8 3.1*  CL 104   < > 108   < > 113* 116* 119* 119* 108  CO2 22   < > 21*   < > 20* 22 23 26  34*  GLUCOSE 95   < > 98   < > 117* 129* 152* 140* 147*  BUN <5*   < > 6   < > 8 10 11 12 12   CREATININE  UNABLE TO REPORT DUE TO ICTERIC INTERFERENCE SDR   < >  UNABLE TO REPORT DUE TO ICTERIC INTERFERENCE SDR   < >  UNABLE TO REPORT DUE TO ICTERUS <0.30* <0.30* <0.30* <0.30*  CALCIUM 8.1*   < > 7.8*   < > 7.9* 7.9* 8.2* 8.2*  8.1*  MG 2.1   < > 2.2   < > 2.3 2.3 2.4 2.6* 2.2  PHOS UNABLE TO REPORT DUE TO ICTERIC INTERFERENCE SDR  --   UNABLE TO REPORT DUE TO ICTERIC INTERFERENCE SDR  --  UNABLE TO REPORT DUE TO ICTERUS UNABLE TO RREPORT DUE TO ICTERUS SDR UNABLE TO REPORT DUE TO ICTERUS  --   --    < > = values in this interval not displayed.   GFR: CrCl cannot be calculated (This lab value cannot be used to calculate CrCl because it is not a number: <0.30). Liver Function Tests: Recent Labs  Lab 11/28/19 0456 11/29/19 0714 11/30/19 0651 12/02/19 1358  AST 180* 159* 135* 120*  ALT 55* 52* 48* 42  ALKPHOS 433* 385* 343* 352*  BILITOT 26.8* 25.5* 22.5* 19.3*  PROT 5.7* 5.3* 5.2* 6.1*  ALBUMIN 2.2* 2.1* 2.1* 2.9*   No results for input(s): LIPASE, AMYLASE in the last 168 hours. Recent Labs  Lab 11/28/19 0456 11/30/19 0651  AMMONIA 103* 58*   Coagulation Profile: Recent Labs  Lab 11/27/19 1218 12/02/19 1358  INR 1.8* 1.5*   Cardiac Enzymes: No results for input(s): CKTOTAL, CKMB, CKMBINDEX, TROPONINI in the last 168 hours. BNP (last 3 results) No results for input(s): PROBNP in the last 8760 hours. HbA1C: No results for input(s): HGBA1C in the last 72 hours. CBG: Recent Labs  Lab 12/03/19 1203 12/03/19 2214 12/04/19 0037 12/04/19 0508 12/04/19 0733  GLUCAP 141* 121* 129* 128* 143*   Lipid Profile: No results for input(s): CHOL, HDL,  LDLCALC, TRIG, CHOLHDL, LDLDIRECT in the last 72 hours. Thyroid Function Tests: No results for input(s): TSH, T4TOTAL, FREET4, T3FREE, THYROIDAB in the last 72 hours. Anemia Panel: No results for input(s): VITAMINB12, FOLATE, FERRITIN, TIBC, IRON, RETICCTPCT in the last 72 hours. Sepsis Labs: No results for input(s): PROCALCITON, LATICACIDVEN in the last 168 hours.  Recent Results (from the past 240 hour(s))  Culture, blood (routine x 2)     Status: None   Collection Time: 11/27/19  1:48 PM   Specimen: BLOOD  Result Value Ref Range Status   Specimen Description BLOOD RIGHT ANTECUBITAL  Final   Special Requests   Final    BOTTLES DRAWN AEROBIC AND ANAEROBIC Blood Culture results may not be optimal due to an inadequate volume of blood received in culture bottles   Culture   Final    NO GROWTH 5 DAYS Performed at [facility], [address] [place], [place] [address]    Report Status 12/02/2019 FINAL  Final  Culture, blood (routine x 2)     Status: None   Collection Time: 11/27/19  1:54 PM   Specimen: BLOOD  Result Value Ref Range Status   Specimen Description BLOOD BLOOD RIGHT HAND  Final   Special Requests   Final    BOTTLES DRAWN AEROBIC AND ANAEROBIC Blood Culture adequate volume   Culture   Final    NO GROWTH 5 DAYS Performed at [facility], [address] [place], [place] [address]    Report Status 12/02/2019 FINAL  Final         Radiology Studies: DG Chest Port 1 View  Result Date: 12/04/2019 CLINICAL DATA:  Hypoxia EXAM: PORTABLE CHEST 1 VIEW COMPARISON:  Chest radiograph dated 12/06/19 FINDINGS: The heart size is normal accounting for technique. The lung volumes are low. There are moderate diffuse bilateral interstitial and airspace opacities which are increased since prior exam. There is no significant pleural  effusion or pneumothorax. The osseous structures are intact. An enteric tube enters the stomach and terminates below the field of  view. IMPRESSION: Moderate diffuse bilateral interstitial and airspace opacities, increased since prior exam. Electronically Signed   By: Romona Curlsyler  Litton M.D.   On: 12/04/2019 10:12        Scheduled Meds: . Chlorhexidine Gluconate Cloth  6 each Topical Daily  . feeding supplement (PROSource TF)  45 mL Per Tube TID  . folic acid  1 mg Per Tube Daily  . free water  400 mL Per Tube Q4H  . furosemide  40 mg Intravenous BID  . heparin  5,000 Units Subcutaneous Q8H  . lactulose  20 g Per Tube BID  . mouth rinse  15 mL Mouth Rinse BID  . multivitamin-lutein  1 capsule Per Tube Daily  . pantoprazole sodium  40 mg Per Tube Daily  . potassium & sodium phosphates  1 packet Per Tube BID WC  . predniSONE  40 mg Per Tube Q breakfast  . rifaximin  550 mg Per Tube BID  . sodium chloride flush  10-40 mL Intracatheter Q12H  . spironolactone  100 mg Per Tube Daily  . thiamine  100 mg Per Tube Daily   Or  . thiamine  100 mg Intravenous Daily   Continuous Infusions: . feeding supplement (OSMOLITE 1.5 CAL) 1,000 mL (12/04/19 0636)     LOS: 13 days    Darlin Priestlyina Divon Krabill, MD [facility]   12/04/2019, 11:43 AM

## 2019-12-04 NOTE — Progress Notes (Signed)
PT Cancellation Note  Patient Details Name: SHAKEENA KAFER MRN: [id number] DOB: 11/28/1980   Cancelled Treatment:    Reason Eval/Treat Not Completed: Medical issues which prohibited therapy: Per nursing pt remains inappropriate for participation with PT services secondary to lethargy and inability to follow commands.  Nursing agreed with completing PT order at this time with PT to reassess pt at a future date pending a change in status and receipt of new PT orders.     Ovidio Hanger PT, DPT 12/04/19, 1:34 PM

## 2019-12-04 NOTE — Progress Notes (Signed)
Husband at bedside. MD made aware

## 2019-12-05 LAB — GLUCOSE, CAPILLARY: Glucose-Capillary: 298 mg/dL — ABNORMAL HIGH (ref 70–99)

## 2019-12-05 DEATH — deceased

## 2019-12-14 ENCOUNTER — Ambulatory Visit: Payer: No Typology Code available for payment source | Admitting: Gastroenterology

## 2020-01-04 NOTE — Progress Notes (Signed)
Received report from offgoing nurse Flo. Patient noted to be deceased at 36. Next of kin notified by Zettie Cooley, RN.

## 2020-01-04 NOTE — Progress Notes (Signed)
Attempted to follow up with patients husband with no answer. Spoke to patients father regarding patient expiration, plan for visiting, and funeral home arrangements. Father stated that he wasn't visiting and no funeral home arrangements have been made. Primary RN and [facility] made aware of above. Bo Mcclintock, RN

## 2020-01-04 NOTE — Discharge Summary (Signed)
Death Summary  Ashlee Hawkins MKL:[id number] DOB: [birth year] DOA: 11-Dec-2019  PCP: [facility]  Admit date: 11-Dec-2019 Date of Death: December 25, 2019 Time of Death: 6:20 am  History of present illness:  Ashlee Hawkins was a 38 year old female with a history of alcohol abuse, seizure disorder and alcohol hepatitis who initially admitedto ICU on 11-Dec-2022 with a severe hyponatremia with sodium 108, ammonia 106.She had been continuously drinking alcohol, last drink was before admission. She was also having seizures.  Since admission to the ICU, sodium level had gone up after giving 3% sodium chloride, she was also giving lactulose.She was very confused, mental status had not improved since admission. Her total bilirubin was 23.3. Ammonia level still elevated. Patient has been seen by palliative care, long-term prognosis was very poor.  Palliative care provider and I have discussed with husband multiple times about goals of care and strongly recommended comfort care measures.  Husband said he needed to see pt first before making that decision.  Husband did come to see pt on 12/04/19, however, did not give permission to make pt comfort care.  Pt passed away early next morning before day team arrived.     Principal Problem:   Decompensated hepatic cirrhosis (HCC) Active Problems:   Hyponatremia with decreased serum osmolality   Alcoholic cirrhosis of liver with ascites (HCC)   Goals of care, counseling/discussion   Palliative care by specialist   DNR (do not resuscitate)   Seizure (HCC)   Severe protein-calorie malnutrition (HCC)   Hypernatremia   Acute hypoxemic respiratory failure (HCC)   Acute hepatic encephalopathy   Hyperbilirubinemia   Elevated liver enzymes   Elevated INR   Lactic acidosis   Alcoholic hepatitis with ascites   Dysphagia   Anemia   Thrombocytopenia (HCC)   Aspiration pneumonia (HCC)   Hypokalemia   Malnutrition of moderate degree   Acute  hypoxic respiratory failure 2/2 acute pulm edema likely due to fluid buildup in the setting of cirrhosis.  O2 requirement increasing, up to 5L. CXR today increased "diffuse bilateral interstitial and airspace opacities" consistent with pulm edema.  Pt received IV lasix 40 mg BID and aldactone 100 mg daily.    Hepatic encephalopathy. Severe hyperbilirubinemia  mostly confused and lethargic.  Received lactulose 20 mg BID and rifaximin 550 mg BID.  #2.  Decompensated Alcohol liver cirrhosis with ascites. Ultrasound did not show large enough ascites to tap, MRI ruled out portal vein thrombosis.  Patient has extreme elevation of bilirubin.  Pt was started on IV solumedrol and IV albumin, per GI.  # dysphagia #3.  Anorexia with a severe protein calorie malnutrition. Pt could not swallow safely.  Husband wanted to initiate tube, so Dobhoff tube placed by IR 10/27, started tube feed.  Pt received empiric supplement with phos.  However, tube feed material was found around pt's mouth concerning for regurgitation.    Hypernatremia free water 400 ml q4h  4.  Aspiration pneumonia. Completed antibiotics x 5days.  5.  Severe hyponatremia, resolved  6.  Anemia of chronic disease.  7.  Thrombocytopenia. Secondary to liver cirrhosis.   Hypokalemia Repleted PRN  Significant Diagnostic Studies: EEG  Result Date: 11/22/2019 Charlsie Quest, MD     11/22/2019  5:15 PM Patient Name: Ashlee Hawkins MRN: [id number] Epilepsy Attending: Charlsie Quest Referring Physician/Provider: Harlon Ditty, NP Date: 11/22/2019 Duration: 26.14 mins Patient history: 39 year old female with breakthrough seizure in the setting of heavy EtOH abuse and severe electrolyte disturbance,  as well as noncompliance with her anticonvulsant. EEG to evaluate for seizure Level of alertness: Awake AEDs during EEG study: OXC, Ativan Technical aspects: This EEG study was done with scalp electrodes positioned according to  the 10-20 International system of electrode placement. Electrical activity was acquired at a sampling rate of  and reviewed with a high frequency filter of  and a low frequency filter of . EEG data were recorded continuously and digitally stored. Description: The posterior dominant rhythm consists of 9 Hz activity of moderate voltage (25-35 uV) seen predominantly in posterior head regions, symmetric and reactive to eye opening and eye closing. EEG showed continuous generalized 3 to 6 Hz theta-delta slowing. Hyperventilation and photic stimulation were not performed.   ABNORMALITY -Continuous  slow, generalized IMPRESSION: This study is suggestive of mild diffuse encephalopathy, nonspecific etiology. No seizures or epileptiform discharges were seen throughout the recording. Priyanka Annabelle Harman   CT ABDOMEN PELVIS WO CONTRAST  Result Date: 11/20/2019 CLINICAL DATA:  Left upper quadrant pain. EXAM: CT ABDOMEN AND PELVIS WITHOUT CONTRAST TECHNIQUE: Multidetector CT imaging of the abdomen and pelvis was performed following the standard protocol without IV contrast. COMPARISON:  October 24, 2019 FINDINGS: Lower chest: No acute abnormality. Hepatobiliary: The liver is enlarged with diffuse fatty infiltration of the liver parenchyma. No focal liver abnormality is seen. Status post cholecystectomy. No biliary dilatation. Pancreas: Unremarkable. No pancreatic ductal dilatation or surrounding inflammatory changes. Spleen: Normal in size without focal abnormality. Adrenals/Urinary Tract: Adrenal glands are unremarkable. Kidneys are normal, without renal calculi, focal lesion, or hydronephrosis. Bladder is unremarkable. Stomach/Bowel: Stomach is within normal limits. Appendix appears normal. No evidence of bowel wall thickening, distention, or inflammatory changes. Noninflamed diverticula are seen within the descending and sigmoid colon. Vascular/Lymphatic: There is moderate severity calcification of the  abdominal aorta and bilateral common iliac arteries, without evidence of aneurysmal dilatation. No enlarged abdominal or pelvic lymph nodes. Reproductive: Uterus and bilateral adnexa are unremarkable. Other: No abdominal wall hernia or abnormality. No abdominopelvic ascites. Musculoskeletal: No acute or significant osseous findings. IMPRESSION: 1. Enlarged fatty liver. 2. Colonic diverticulosis. 3. Aortic atherosclerosis. Aortic Atherosclerosis (ICD10-I70.0). Electronically Signed   By: Aram Candela M.D.   On: 11/07/2019 21:50   CT Head Wo Contrast  Result Date: 12/01/2019 CLINICAL DATA:  Seizure. EXAM: CT HEAD WITHOUT CONTRAST TECHNIQUE: Contiguous axial images were obtained from the base of the skull through the vertex without intravenous contrast. COMPARISON:  October 24, 2019 FINDINGS: Brain: No evidence of acute infarction, hemorrhage, hydrocephalus, extra-axial collection or mass lesion/mass effect. Vascular: No hyperdense vessel or unexpected calcification. Skull: Normal. Negative for fracture or focal lesion. Sinuses/Orbits: No acute finding. Other: None. IMPRESSION: No acute intracranial pathology. Electronically Signed   By: Aram Candela M.D.   On: 11/20/2019 21:42   US Abdomen Limited  Result Date: 11/27/2019 CLINICAL DATA:  Ascites EXAM: LIMITED ABDOMEN ULTRASOUND FOR ASCITES TECHNIQUE: Limited ultrasound survey for ascites was performed in all four abdominal quadrants. COMPARISON:  CT abdomen and pelvis November 13, 2019 FINDINGS: Only trace free fluid is present about the liver. No significant pocket of fluid is demonstrated on exam for ascites. IMPRESSION: Minimal fluid about the liver Electronically Signed   By: Donzetta Kohut M.D.   On: 11/27/2019 16:29   DG Chest Port 1 View  Result Date: 12/04/2019 CLINICAL DATA:  Hypoxia EXAM: PORTABLE CHEST 1 VIEW COMPARISON:  Chest radiograph dated 11/17/2019 FINDINGS: The heart size is normal accounting for technique. The lung volumes  are low. There  are moderate diffuse bilateral interstitial and airspace opacities which are increased since prior exam. There is no significant pleural effusion or pneumothorax. The osseous structures are intact. An enteric tube enters the stomach and terminates below the field of view. IMPRESSION: Moderate diffuse bilateral interstitial and airspace opacities, increased since prior exam. Electronically Signed   By: Romona Curls M.D.   On: 12/04/2019 10:12   DG Chest Portable 1 View  Result Date: 11/28/2019 CLINICAL DATA:  Weakness.  Liver failure, confusion. EXAM: PORTABLE CHEST 1 VIEW COMPARISON:  10/24/2019 FINDINGS: Elevated right hemidiaphragm with right lower lobe atelectasis. Left lung is clear. Heart size and vascularity normal. IMPRESSION: Right lower lobe atelectasis.  No acute abnormality. Electronically Signed   By: Marlan Palau M.D.   On: 11/23/2019 17:39   DG Naso G Tube Plc W/Fl W/Rad  Result Date: 11/30/2019 CLINICAL DATA:  NG tube placement. EXAM: NASO G TUBE PLACEMENT WITH FL AND WITH RAD CONTRAST:  5 cc Omnipaque 300 FLUOROSCOPY TIME:  Fluoroscopy Time:  1 minutes and 24 seconds. Number of Acquired Spot Images: 2 COMPARISON:  11/13/2019 CT abdomen pelvis. FINDINGS: An 81 French Dobbhoff tube was placed into the left nasal cavity and advanced into the proximal duodenum. High-resolution spot image demonstrates the tube overlying the proximal duodenum. 5 cc of Omnipaque 300 was injected into the tube to confirm positioning within the proximal small bowel. The patient tolerated the procedure without complication. IMPRESSION: Technically successful placement of 8 Fr Dobbhoff tube terminating within the proximal duodenum. Electronically Signed   By: Stana Bunting M.D.   On: 11/30/2019 17:07   US LIVER DOPPLER  Result Date: 11/27/2019 CLINICAL DATA:  Alcoholic cirrhosis EXAM: DUPLEX ULTRASOUND OF LIVER TECHNIQUE: Color and duplex Doppler ultrasound was performed to evaluate the  hepatic in-flow and out-flow vessels. COMPARISON:  CT evaluation from November 21, 2019 FINDINGS: Liver: Marked increased echogenicity of the liver which attenuates the ultrasound beam making remaining assessments quite limited. Heterogeneity of echotexture. Nodular contour, mildly nodular contour. Assessment for focal lesions is limited by the severe fatty infiltration on the current study. No gross lesion is seen. Main Portal Vein size: 1.8 cm Portal Vein Velocities Portal vein can be visualized on grayscale but power Doppler or color Doppler flow cannot be obtained. Spectral Doppler without convincing sign of flow as well. Hepatic Vein Velocities Hepatic veins are not visualized due to severe echogenicity increased in the liver. IVC: Patent with velocity of 32 centimeter/second and pulsatile flow suggested though with limited assessment. Hepatic Artery Velocity:  132 cm/sec Splenic Vein Velocity:  17.8 cm/sec Spleen: 10.1 x 10.3 x 4.8 cm with a total volume of 259 cm^3 (411 cm^3 is upper limit normal) Portal Vein Occlusion/Thrombus: Possible portal venous occlusion though exam is markedly limited. Splenic Vein Occlusion/Thrombus: No Ascites: Trace Varices: None visualized IMPRESSION: 1. Findings that are highly suggestive of portal venous thrombosis. Portal venous flow was visualized on the study of 11/14/2019. For confirmation, given the technical limitations of the current study would consider MR venography or CT venography as possible in this patient. 2. Hepatic veins cannot be visualized, of uncertain significance given the severity of hepatic steatosis. 3. Severe hepatic steatosis. These results were called by telephone at the time of interpretation on 11/27/2019 at 5:00 pm to provider [facility] , who verbally acknowledged these results. Electronically Signed   By: Donzetta Kohut M.D.   On: 11/27/2019 17:12   MR MRV ABDOMEN W WO CONTRAST  Result Date: 11/28/2019 CLINICAL DATA:  39 year old female with  decompensated alcoholic cirrhosis. Evaluate for portal vein thrombosis. EXAM: MRA ABDOMEN AND PELVIS WITH CONTRAST TECHNIQUE: Multiplanar, multiecho pulse sequences of the abdomen and pelvis were obtained with intravenous contrast. Angiographic images of abdomen and pelvis were obtained using MRA technique with intravenous contrast. Venous phase imaging was obtained. CONTRAST:  10mL GADAVIST GADOBUTROL 1 MMOL/ML IV SOLN COMPARISON:  CT abdomen/pelvis 2019-12-08 FINDINGS: MRA ABDOMEN FINDINGS Aorta: Normal caliber aorta without evidence of dissection, aneurysm or significant stenosis. Celiac axis: Patent.  Conventional hepatic arterial anatomy. SMA: Widely patent. Renals: There are 2 right-sided renal arteries and a single left-sided renal artery. Renal arteries are patent without significant stenosis, aneurysm, dissection or evidence of fibromuscular dysplasia. IMA: Not well seen. Iliac arteries: Normal in caliber. No evidence of stenosis, aneurysm or dissection. Venous: Portal venous phase imaging demonstrates patency of the main portal vein and intrahepatic portal venous branches. No evidence of portal vein thrombosis. Additionally, the hepatic veins are also patent. The hepatic veins and intrahepatic IVC are slightly compressed, likely secondary to relative hepatic steatosis and hepatomegaly. No significant varices are visualized. MRI ABD FINDINGS Hepatomegaly. The liver measures up to 29 cm in craniocaudal dimension. The hepatic parenchyma is slightly hyperintense on T1 weighted imaging, hypointense on T2 weighted imaging and demonstrates significant signal dropout on fat saturation images. Findings are consistent with hepatic steatosis. No discrete hepatic lesion identified. No intra or extrahepatic biliary ductal dilatation. Unremarkable pancreas without evidence of inflammation or mass. The spleen is normal in size. Kidneys and adrenal glands are unremarkable in appearance. No evidence of bowel obstruction or  focal bowel abnormality. The visualized lower chest is unremarkable. No focal abnormal signal or enhancement within the visualized musculoskeletal structures. IMPRESSION: 1. The portal and hepatic veins remain patent. No evidence of portal venous thrombosis. 2. Hepatomegaly with marked steatosis. 3. No evidence of discrete hepatic lesion. Electronically Signed   By: Malachy Moan M.D.   On: 11/28/2019 08:12   US Abdomen Limited RUQ (LIVER/GB)  Result Date: 12/08/19 CLINICAL DATA:  Bilirubinemia, alcoholism. EXAM: ULTRASOUND ABDOMEN LIMITED RIGHT UPPER QUADRANT COMPARISON:  10/24/2019 CT chest, abdomen and pelvis. 10/12/2019 abdominal ultrasound and prior. FINDINGS: Gallbladder: Surgically absent. Common bile duct: Diameter: 13 mm, compatible with post cholecystectomy sequela. Liver: No focal lesion identified. Heterogenous, echogenic parenchyma. Please note that ultrasound is limited in its sensitivity to evaluate a heterogenous liver for focal lesions. Portal vein is patent on color Doppler imaging with normal direction of blood flow towards the liver. Other: None. IMPRESSION: Heterogenous, echogenic hepatic parenchyma. No focal hepatic lesion. Post cholecystectomy sequela. Electronically Signed   By: Stana Bunting M.D.   On: Dec 08, 2019 15:45    Microbiology: Recent Results (from the past 240 hour(s))  Culture, blood (routine x 2)     Status: None   Collection Time: 11/27/19  1:48 PM   Specimen: BLOOD  Result Value Ref Range Status   Specimen Description BLOOD RIGHT ANTECUBITAL  Final   Special Requests   Final    BOTTLES DRAWN AEROBIC AND ANAEROBIC Blood Culture results may not be optimal due to an inadequate volume of blood received in culture bottles   Culture   Final    NO GROWTH 5 DAYS Performed at [facility], [address] [place], [place] [address]    Report Status 12/02/2019 FINAL  Final  Culture, blood (routine x 2)     Status: None   Collection Time:  11/27/19  1:54 PM   Specimen: BLOOD  Result  Value Ref Range Status   Specimen Description BLOOD BLOOD RIGHT HAND  Final   Special Requests   Final    BOTTLES DRAWN AEROBIC AND ANAEROBIC Blood Culture adequate volume   Culture   Final    NO GROWTH 5 DAYS Performed at [facility], [address] [place], [place] [address]    Report Status 12/02/2019 FINAL  Final     Labs: Basic Metabolic Panel: Recent Labs  Lab 11/29/19 0714 11/29/19 9476 11/30/19 5465 11/30/19 0354 12/01/19 0427 12/01/19 0427 12/02/19 0625 12/02/19 0625 12/03/19 0618 12/04/19 0444  NA 139   < > 144  --  147*  --  151*  --  153* 151*  K 3.8   < > 3.4*   < > 3.2*   < > 3.2*   < > 3.8 3.1*  CL 108   < > 113*  --  116*  --  119*  --  119* 108  CO2 21*   < > 20*  --  22  --  23  --  26 34*  GLUCOSE 98   < > 117*  --  129*  --  152*  --  140* 147*  BUN 6   < > 8  --  10  --  11  --  12 12  CREATININE  UNABLE TO REPORT DUE TO ICTERIC INTERFERENCE SDR   < >  UNABLE TO REPORT DUE TO ICTERUS  --  <0.30*  --  <0.30*  --  <0.30* <0.30*  CALCIUM 7.8*   < > 7.9*  --  7.9*  --  8.2*  --  8.2* 8.1*  MG 2.2   < > 2.3  --  2.3  --  2.4  --  2.6* 2.2  PHOS  UNABLE TO REPORT DUE TO ICTERIC INTERFERENCE SDR  --  UNABLE TO REPORT DUE TO ICTERUS  --  UNABLE TO RREPORT DUE TO ICTERUS SDR  --  UNABLE TO REPORT DUE TO ICTERUS  --   --   --    < > = values in this interval not displayed.   Liver Function Tests: Recent Labs  Lab 11/29/19 0714 11/30/19 0651 12/02/19 1358  AST 159* 135* 120*  ALT 52* 48* 42  ALKPHOS 385* 343* 352*  BILITOT 25.5* 22.5* 19.3*  PROT 5.3* 5.2* 6.1*  ALBUMIN 2.1* 2.1* 2.9*   No results for input(s): LIPASE, AMYLASE in the last 168 hours. Recent Labs  Lab 11/30/19 0651  AMMONIA 58*   CBC: Recent Labs  Lab 11/29/19 0714 12/01/19 0427 12/02/19 0625 12/03/19 0618 12/04/19 0444  WBC 9.0 11.0* 14.1* 11.3* 12.4*  NEUTROABS 6.1  --   --   --   --   HGB 9.0* 8.1* 8.8* 8.4* 8.5*    HCT 29.7* 26.0* 29.2* 28.7* 27.6*  MCV 119.8* 119.8* 122.7* 127.0* 123.2*  PLT 92* 90* 93* 95* 96*   Cardiac Enzymes: No results for input(s): CKTOTAL, CKMB, CKMBINDEX, TROPONINI in the last 168 hours. D-Dimer No results for input(s): DDIMER in the last 72 hours. BNP: Invalid input(s): POCBNP CBG: Recent Labs  Lab 12/04/19 0508 12/04/19 0733 12/04/19 2010 12/04/19 2339 01/03/2020 0443  GLUCAP 128* 143* 122* 97 298*   Anemia work up No results for input(s): VITAMINB12, FOLATE, FERRITIN, TIBC, IRON, RETICCTPCT in the last 72 hours. Urinalysis    Component Value Date/Time   COLORURINE AMBER (A) 2019/12/10 2311   APPEARANCEUR HAZY (A) 12-10-2019 2311   LABSPEC 1.025 12-10-2019  2311   PHURINE 5.0 11/11/2019 2311   GLUCOSEU NEGATIVE 11/26/2019 2311   HGBUR SMALL (A) 11/22/2019 2311   BILIRUBINUR MODERATE (A) 11/15/2019 2311   KETONESUR 20 (A) 11/18/2019 2311   PROTEINUR 30 (A) 11/11/2019 2311   NITRITE POSITIVE (A) 11/10/2019 2311   LEUKOCYTESUR NEGATIVE 11/27/2019 2311   Sepsis Labs Invalid input(s): PROCALCITONIN,  WBC,  LACTICIDVEN     SIGNED:  Darlin Priestlyina Keneth Borg, MD  [facility] 12/26/2019, 8:52 AM  No bill note because pt passed away before I came on service.

## 2020-01-04 NOTE — Progress Notes (Signed)
[name] body taken to morgue.

## 2020-01-04 DEATH — deceased
